# Patient Record
Sex: Male | Born: 1965 | Race: White | Hispanic: No | Marital: Single | Smoking: Current every day smoker
Health system: Southern US, Community
[De-identification: ages and names within clinical notes are randomized; demographics above are authoritative.]

## PROBLEM LIST (undated history)

## (undated) DIAGNOSIS — G8929 Other chronic pain: Secondary | ICD-10-CM

## (undated) DIAGNOSIS — S82392A Other fracture of lower end of left tibia, initial encounter for closed fracture: Secondary | ICD-10-CM

## (undated) DIAGNOSIS — IMO0002 Reserved for concepts with insufficient information to code with codable children: Secondary | ICD-10-CM

## (undated) DIAGNOSIS — K859 Acute pancreatitis without necrosis or infection, unspecified: Secondary | ICD-10-CM

## (undated) DIAGNOSIS — F419 Anxiety disorder, unspecified: Secondary | ICD-10-CM

## (undated) DIAGNOSIS — I219 Acute myocardial infarction, unspecified: Secondary | ICD-10-CM

## (undated) DIAGNOSIS — M549 Dorsalgia, unspecified: Secondary | ICD-10-CM

## (undated) DIAGNOSIS — E119 Type 2 diabetes mellitus without complications: Secondary | ICD-10-CM

## (undated) DIAGNOSIS — R7303 Prediabetes: Secondary | ICD-10-CM

## (undated) DIAGNOSIS — G4730 Sleep apnea, unspecified: Secondary | ICD-10-CM

## (undated) DIAGNOSIS — I10 Essential (primary) hypertension: Secondary | ICD-10-CM

## (undated) DIAGNOSIS — E7800 Pure hypercholesterolemia, unspecified: Secondary | ICD-10-CM

## (undated) HISTORY — PX: APPENDECTOMY: SHX54

## (undated) HISTORY — DX: Essential (primary) hypertension: I10

## (undated) HISTORY — PX: BACK SURGERY: SHX140

## (undated) HISTORY — PX: ABDOMINAL SURGERY: SHX537

---

## 1988-07-11 HISTORY — PX: ABDOMINAL SURGERY: SHX537

## 2001-07-11 HISTORY — PX: CARDIAC CATHETERIZATION: SHX172

## 2002-03-15 ENCOUNTER — Inpatient Hospital Stay (HOSPITAL_COMMUNITY): Admission: EM | Admit: 2002-03-15 | Discharge: 2002-03-18 | Payer: Self-pay | Admitting: Cardiology

## 2002-03-18 ENCOUNTER — Encounter: Payer: Self-pay | Admitting: Cardiology

## 2002-10-02 ENCOUNTER — Ambulatory Visit (HOSPITAL_BASED_OUTPATIENT_CLINIC_OR_DEPARTMENT_OTHER): Admission: RE | Admit: 2002-10-02 | Discharge: 2002-10-02 | Payer: Self-pay | Admitting: *Deleted

## 2003-01-15 ENCOUNTER — Ambulatory Visit (HOSPITAL_BASED_OUTPATIENT_CLINIC_OR_DEPARTMENT_OTHER): Admission: RE | Admit: 2003-01-15 | Discharge: 2003-01-15 | Payer: Self-pay | Admitting: *Deleted

## 2004-10-09 ENCOUNTER — Emergency Department (HOSPITAL_COMMUNITY): Admission: EM | Admit: 2004-10-09 | Discharge: 2004-10-09 | Payer: Self-pay | Admitting: Emergency Medicine

## 2009-04-03 ENCOUNTER — Emergency Department (HOSPITAL_COMMUNITY): Admission: EM | Admit: 2009-04-03 | Discharge: 2009-04-03 | Payer: Self-pay | Admitting: Emergency Medicine

## 2009-06-18 ENCOUNTER — Ambulatory Visit (HOSPITAL_COMMUNITY): Admission: RE | Admit: 2009-06-18 | Discharge: 2009-06-18 | Payer: Self-pay | Admitting: Family Medicine

## 2010-11-26 NOTE — Op Note (Signed)
[name], BRAU                      ACCOUNT NO.:  [id number]   MEDICAL RECORD NO.:  [id number]                   PATIENT TYPE:  AMB   LOCATION:  DSC                                  FACILITY:  [facility]   PHYSICIAN:  Lowell Bouton, M.D.      DATE OF BIRTH:  1966-05-09   DATE OF PROCEDURE:  DATE OF DISCHARGE:                                 OPERATIVE REPORT   PREOPERATIVE DIAGNOSIS:  Chronic laceration flexor pollicis longus, left  thumb.   POSTOPERATIVE DIAGNOSIS:  Chronic laceration flexor pollicis longus, left  thumb.   OPERATION/PROCEDURE:  Excision of flexor tendon, left thumb, with insertion  of Hunter silastic tendon rod, left thumb.   SURGEON:  Lowell Bouton, M.D.   ANESTHESIA:  General.   OPERATIVE FINDINGS:  The patient had a 69-month-old FPL tendon laceration at  the level of the proximal phalanx.  The distal end of the tendon appeared  normal at the IP joint.  There was significant scarring in the tendon sheath  with some pseudo tendon formation beneath the oblique pulley and the A1  pulley.  The proximal end of the tendon had retracted back proximal to the  MP joint and was easily retrieved at the wrist level.  Due to the severe  amount of scarring in the tendon sheath, it was felt that an immediate  tendon graft would not be possible and that a two-stage tendon procedure  would be performed.   DESCRIPTION OF PROCEDURE:  Under general anesthesia with the tourniquet on  the left arm and left leg, the left upper and lower extremities were prepped  and draped in the usual fashion.  The upper limb was exsanguinated and the  tourniquet was inflated to 250 mmHg.  A volar Brunner approach was made to  the thumb with a zigzag incision.  This extended from just proximal to the  MP joint out to the mid pulp. Sharp dissection was carried to the  subcutaneous tissue and there was a significant amount of scarring in the  subcutaneous tissues.   Blunt dissection was carried down to flexor sheath,  taking care to protect the digital nerve.  A 4-0 silk retention suture was  inserted for retraction.  The flexor tendon was identified distally and was  pulled out from the pulley system leaving a flap of tendon attached  distally.  The flexor tendon sheath was significantly scarred and there was  pseudo tendon that was excised.  The Therapist, nutritional and a Scientist, physiological were used to release any of the pseudo tendon.  At this point a V-  shaped incision was made following an old scar at the wrist radially and  carried down through the subcutaneous tissue.  Blunt dissection was carried  down to the FCR sheath and the FCR tendon was retracted radially.  The floor  of the sheath was incised and the flexor pollicis longus muscle belly was  identified.  The tendon was pulled  back into the wrist level and was  transected keeping the remaining tendon tissue for possible pulley  reconstruction if needed.  The thumb tendon sheath was then dilated with a  Freer elevator beneath the oblique and the A1 pulleys which remained intact.  A #4 passive Hunter tendon rod was then inserted from distal to proximal and  an attempt was made to pass the rod back to the wrist without success.  A  Carroll tendon passer was placed from proximal to distal but could not be  threaded up to the thumb.  At this point a carpal tunnel incision was made  and carried down sharply through the subcutaneous tissues.  The carpal  tunnel was released sharply on the ulnar side of the median nerve allowing  access to the [facility].  This was then placed up to the MP  joint of the thumb and the Hunter rod was passed from the thumb back to the  wrist level.  The Hunter rod was attached distally with a 3-0 Ethibond  suture in a Kessler fashion underneath the stump of the remaining FDL.  This  was secured multiple times with multiple sutures.  The thumb was then   passively flexed and the Hunter rod did fly beneath the flexor pulley  system.  The Hunter rod was then transected at the FPL muscle belly and  there was good excursion with flexion of the thumb and wrist.  The wounds were then irrigated copiously with saline and the carpal tunnel  incision and the thumb incision were closed with 4-0 nylon sutures.  The  wrist incision was closed with a 3-0 subcuticular Prolene.  Steri-Strips  were applied.  Sterile dressings were applied and a dorsal thumb spica  splint with the wrist in 30 degrees of flexion and the thumb flexed.  The  tourniquet was released with good circulation of the hand, 0.5% Marcaine was  inserted for pain control.  The patient tolerated the procedure well and  went to the recovery room awake and stable in good condition.                                               Lowell Bouton, M.D.    EMM/MEDQ  D:  10/02/2002  T:  10/02/2002  Job:  [id number]

## 2010-11-26 NOTE — Op Note (Signed)
[name], [name]                      ACCOUNT NO.:  [id number]   MEDICAL RECORD NO.:  [id number]                   PATIENT TYPE:  AMB   LOCATION:  DSC                                  FACILITY:  [facility]   PHYSICIAN:  Lowell Bouton, M.D.      DATE OF BIRTH:  1966-05-23   DATE OF PROCEDURE:  01/15/2003  DATE OF DISCHARGE:                                 OPERATIVE REPORT   PREOPERATIVE DIAGNOSIS:  Status post stage I tendon reconstruction, left  thumb.   POSTOPERATIVE DIAGNOSIS:  Status post stage I tendon reconstruction, left  thumb.   OPERATION PERFORMED:  Stage II tendon reconstruction, left thumb, using  palmaris longus tendon graft, left forearm.   SURGEON:  Lowell Bouton, M.D.   ANESTHESIA:  General.   OPERATIVE FINDINGS:  The patient had a fair amount of scarring at the wrist  level but the motor of the FPL tendon was present with a large muscle belly.  There was good synovial sheath surrounding the Hunter rod.   DESCRIPTION OF PROCEDURE:  Under general anesthesia with a tourniquet on the  left arm, the left arm was prepped and draped in the usual sterile fashion.  After exsanguinating the limb, the tourniquet was inflated to .  The  previous incision was opened, over the IP joint volarly on the left thumb.  There was a lot of scarring in the subcutaneous tissues.  Sharp dissection  was carried down to the tendon sheath and the Hunter rod was identified  distally.  Curved incision was then made over the volar radial aspect of the  wrist in line with the old incision.  Sharp dissection was carried through  the subcutaneous tissues and bleeding points were coagulated.  Sharp  dissection was carried through the FCR sheath and the floor of the sheath.  Blunt dissection was carried down to the FPL muscle belly and this was freed  of scar tissue.  Weitlaner retractors were inserted and the FPL tendon was  prepared at the muscle belly for  grafting.  By flexing the thumb, the Hunter  rod poked through the sheath into the wrist.  At this point a palmaris  longus tendon graft was obtained by making a transverse incision at the  distal wrist crease over the tendon.  The tendon was dissected out and then  a 4-0 Ethibond suture placed on the end of it.  It was transected at the  distal wrist crease.  A second transverse incision was made more proximally  and the tendon was brought into the proximal wound.  A third and a fourth  incision were made measuring 1 cm long over the tendon up to the proximal  forearm.  The entire tendon graft was then removed and placed in a saline  soaked sponge. The wounds on the forearm were closed with a 4-0 nylon  suture.  The tendon graft from the palmaris longus was then attached to  proximal end of  the Hunter rod at the wrist using a 3-0 Ethibond Kessler  suture.  The Hunter rod was then pulled out distally and allowed passage of  the palmaris graft from proximal to distal in the tendon sheath of the  thumb.  The Hunter rod was discarded.  The graft appeared to be of  appropriate length and so a 3-0 Prolene suture was placed in a Kessler  fashion in the distal end.  Sharp dissection was carried down to the distal  phalanx underneath the stump of the old FPL.  A Keith needle was placed  times two through the distal phalanx and the 3-0 Prolene suture was passed  through the two Malo needles and tied over a button on the dorsum of the  thumb.  The distal wound was then irrigated and closed with 4-0 nylon  sutures.  The tendon was then pulled on proximally and did flex the tip of  the thumb.  The proximal juncture was made in a Pulvertaft weave fashion  into the old FPL muscle belly and the remaining tendon.  Three passes were  made with the tendon braider and was secured with 3-0 Ethibond suture.  The  tension was set with the thumb flexed maximally in neutral wrist position.  After doing the weave,  proximally by tenodesis, the thumb did extend with  full flexion and flexed with full extension of the wrist.  The wound was  then irrigated copiously with saline. A vessel loop drain was left in for  drainage.  The skin was closed with 4-0 nylon suture.  Sterile dressings  were applied followed by a dorsal splint with the wrist in 30 degrees of  flexion and the thumb flexed fully.  The tourniquet was released with good  circulation to the hand.  The patient went to the recovery room awake and  stable in good condition.                                                Lowell Bouton, M.D.    EMM/MEDQ  D:  01/15/2003  T:  01/15/2003  Job:  [id number]

## 2010-11-26 NOTE — Cardiovascular Report (Signed)
[name], DUFRESNE                      ACCOUNT NO.:  [id number]   MEDICAL RECORD NO.:  [id number]                   PATIENT TYPE:  INP   LOCATION:  2040                                 FACILITY:  [facility]   PHYSICIAN:  Veneda Melter, M.D. [facility]               DATE OF BIRTH:  03-05-1966   DATE OF PROCEDURE:  03/18/2002  DATE OF DISCHARGE:                              CARDIAC CATHETERIZATION   PROCEDURE PERFORMED:  1. Left heart catheterization.  2. Left ventriculogram.  3. Selective coronary angiography.   DIAGNOSES:  1. Trivial coronary artery disease by angiogram.  2. Normal left ventricular systolic function.   HISTORY:  The patient is a 45 year old gentleman who presents with  substernal chest discomfort.  The patient was admitted to the hospital and  had nonspecific ECG and cardiac enzyme changes.  In addition, he had  abnormalities on sinus rhythm suggestive either of ventricular tachycardia  or artifact.  Given his strong family history of coronary disease, continued  tobacco use, and high risk features, he was referred for coronary  angiography.   TECHNIQUE:  Informed consent was obtained.  The patient was brought to the  catheterization lab.  A #6 French sheath was placed in the right femoral  artery using the modified Seldinger technique.  A #6 [place] and JR4  catheter was then used to engage the left and right coronary arteries and  selective angiography performed in various projections using manual  injections of contrast.  A #6 French pigtail catheter was then advanced to  the left ventricle and left ventriculogram performed using power injection  of contrast.  At the termination of the case, the catheters and sheath were  removed and manual pressure applied until adequate hemostasis was achieved.  The patient tolerated the procedure well and was transferred to the floor in  stable condition.  Findings are as follows:   FINDINGS:  1. Left main trunk:  Large  caliber vessel, angiographically normal.  2. LAD:  This is a medium caliber vessel that provides two diagonal     branches.  The LAD has luminal irregularities in the distal section of     20%.  3. Left circumflex artery:  This is a medium caliber vessel that provides     two small marginal branches in the mid section.  The left circumflex has     mild disease of 20-30% in the AV segment extending into the second     marginal branch.  4. The right coronary artery is dominant.  This is a large caliber vessel     that provides a posterior descending artery and posterior ventricular     branch.  The right coronary artery has mild ostial narrowing of 20%.  The     remainder of the vessel has luminal irregularities.  5. LV:  Normal end systolic and end diastolic dimensions.  Overall left     ventricular function  is well preserved.  Ejection fraction of greater     than 55%.  No mitral regurgitation.   PRESSURES:  1. LV pressure is 120/7.  2. Aortic is 120/80.  3. LVEDP equals 15.   ASSESSMENT/PLAN:  The patient is a 45 year old gentleman who presents with  substernal chest discomfort.  He has mild coronary artery disease by  angiogram.  Continued medical therapy and risk factor modification will be  pursued and the cause of chest pain investigated.                                               Veneda Melter, M.D. [facility]    NG/MEDQ  D:  03/18/2002  T:  03/18/2002  Job:  [id number]   cc:   Luis Abed, M.D. [facility]   Xaje Hasanaj

## 2010-11-26 NOTE — Discharge Summary (Signed)
   [name], [name]                      ACCOUNT NO.:  [id number]   MEDICAL RECORD NO.:  [id number]                   PATIENT TYPE:  INP   LOCATION:  2040                                 FACILITY:  [facility]   PHYSICIAN:  Gerrit Friends. Dietrich Pates, M.D. [facility]        DATE OF BIRTH:  Jun 29, 1966   DATE OF ADMISSION:  03/15/2002  DATE OF DISCHARGE:                           DISCHARGE SUMMARY - REFERRING   HISTORY OF PRESENT ILLNESS:  The patient is a 45 year old patient that was  initially admitted to [facility] after a sudden onset of substernal  chest pain.  He was admitted to [facility] on March 14, 2002 and on that  date, he underwent cardiac consultation by Dr. Luis Abed.  Apparently,  at that time, there was a rhythm strip that was concerning for ventricular  tachycardia and because of his symptoms and because of his rhythm, he was  sent to [facility] for cardiac catheterization.   HOSPITAL COURSE:  On March 18, 2002, he underwent cardiac catheterization  and was found to have luminal irregularities of his blood vessels, however,  no obstructive coronary artery disease noted.  His EF was 55% with no MR.  Plans were made for him to go home on March 18, 2002 with no medication  changes.   DISPOSITION AND FOLLOWUP:  Our plans are for him to go home and to follow up  in the office at [facility] under the care of Dr. Gerrit Friends. Rothbart.  Apparently, Dr. Dietrich Pates takes care of this patient's father, who does have  known coronary artery disease.  The appointment is on Tuesday, September  23rd, at 1 p.m.; this will be for an exercise treadmill test.  Although the  patient's blood pressure has been under good condition here, we need to  monitor the patient's blood pressure during the stress test and look for any  ventricular arrhythmias.   LABORATORY STUDIES:  Troponin 0.05.  Potassium 4.0, BUN 11, creatinine 1.0.  Hemoglobin 15.9, hematocrit 45.5, platelets 130,000,  white count 14.6.   DISCHARGE INSTRUCTIONS:  The patient will need a CBC redrawn at the office  to assure his leukocytosis has resolved.     Guy Franco, P.A. [facility]                      Gerrit Friends. Dietrich Pates, M.D. [facility]    LB/MEDQ  D:  03/18/2002  T:  03/19/2002  Job:  [id number]   cc:   Lia Hopping

## 2010-12-17 ENCOUNTER — Ambulatory Visit (HOSPITAL_COMMUNITY)
Admission: RE | Admit: 2010-12-17 | Discharge: 2010-12-17 | Disposition: A | Discharge status: Home / Self Care | Payer: PRIVATE HEALTH INSURANCE | Source: Ambulatory Visit | Attending: Family Medicine | Admitting: Family Medicine

## 2010-12-17 ENCOUNTER — Other Ambulatory Visit (HOSPITAL_COMMUNITY): Payer: Self-pay | Admitting: Family Medicine

## 2010-12-17 DIAGNOSIS — E785 Hyperlipidemia, unspecified: Secondary | ICD-10-CM

## 2010-12-17 DIAGNOSIS — F17200 Nicotine dependence, unspecified, uncomplicated: Secondary | ICD-10-CM | POA: Insufficient documentation

## 2010-12-17 DIAGNOSIS — E119 Type 2 diabetes mellitus without complications: Secondary | ICD-10-CM

## 2010-12-17 DIAGNOSIS — R079 Chest pain, unspecified: Secondary | ICD-10-CM | POA: Insufficient documentation

## 2011-05-29 ENCOUNTER — Emergency Department (HOSPITAL_COMMUNITY)
Admission: EM | Admit: 2011-05-29 | Discharge: 2011-05-30 | Disposition: A | Discharge status: Home / Self Care | Payer: PRIVATE HEALTH INSURANCE | Attending: Emergency Medicine | Admitting: Emergency Medicine

## 2011-05-29 DIAGNOSIS — F411 Generalized anxiety disorder: Secondary | ICD-10-CM | POA: Insufficient documentation

## 2011-05-29 DIAGNOSIS — Z202 Contact with and (suspected) exposure to infections with a predominantly sexual mode of transmission: Secondary | ICD-10-CM | POA: Insufficient documentation

## 2011-05-29 DIAGNOSIS — F17200 Nicotine dependence, unspecified, uncomplicated: Secondary | ICD-10-CM | POA: Insufficient documentation

## 2011-05-29 HISTORY — DX: Pure hypercholesterolemia, unspecified: E78.00

## 2011-05-29 HISTORY — DX: Anxiety disorder, unspecified: F41.9

## 2011-05-29 MED ORDER — AZITHROMYCIN 250 MG PO TABS
1000.0000 mg | ORAL_TABLET | Freq: Once | ORAL | Status: AC
  Administered 2011-05-29: 1000 mg via ORAL
  Filled 2011-05-29: qty 4

## 2011-05-29 MED ORDER — METRONIDAZOLE 500 MG PO TABS
2000.0000 mg | ORAL_TABLET | Freq: Once | ORAL | Status: AC
  Administered 2011-05-29: 2000 mg via ORAL
  Filled 2011-05-29: qty 4

## 2011-05-29 MED ORDER — LIDOCAINE HCL (PF) 1 % IJ SOLN
INTRAMUSCULAR | Status: AC
  Administered 2011-05-29
  Filled 2011-05-29: qty 5

## 2011-05-29 MED ORDER — CEFTRIAXONE SODIUM 250 MG IJ SOLR
250.0000 mg | Freq: Once | INTRAMUSCULAR | Status: AC
  Administered 2011-05-29: 250 mg via INTRAMUSCULAR
  Filled 2011-05-29: qty 250

## 2011-05-29 NOTE — ED Notes (Signed)
Pts girlfriend was diagnosed with trichomonas today and pt wants to be checked for the same.

## 2011-05-29 NOTE — ED Provider Notes (Signed)
History     CSN: [id number] Arrival date & time: 05/29/2011 10:11 PM   First MD Initiated Contact with Patient 05/29/11 2215      Chief Complaint  Patient presents with  . Exposure to STD    (Consider location/radiation/quality/duration/timing/severity/associated sxs/prior treatment) HPI Comments: Pt's wife was seen earlier today and dx with trich and bacterial vaginosis.  He states she is accusing him of giving the STD's and he is here to be evaluated.  He denies penile d/c, skin rash or joint pain.  Patient is a 45 y.o. male presenting with STD exposure. The history is provided by the patient. No language interpreter was used.  Exposure to STD This is a new problem.    Past Medical History  Diagnosis Date  . Hypercholesteremia   . Anxiety     Past Surgical History  Procedure Date  . Appendectomy     No family history on file.  History  Substance Use Topics  . Smoking status: Current Everyday Smoker -- 0.5 packs/day  . Smokeless tobacco: Not on file  . Alcohol Use: No      Review of Systems  Genitourinary: Negative for dysuria, urgency, frequency, hematuria, flank pain, discharge, scrotal swelling, genital sores, penile pain and testicular pain.  All other systems reviewed and are negative.    Allergies  Review of patient's allergies indicates no known allergies.  Home Medications   Current Outpatient Rx  Name Route Sig Dispense Refill  . ALPRAZOLAM 1 MG PO TABS Oral Take 1 mg by mouth 3 (three) times daily.        BP 150/98  Pulse 79  Temp(Src) 97.5 F (36.4 C) (Oral)  Resp 20  Ht 6' (1.829 m)  Wt 239 lb (108.41 kg)  BMI 32.41 kg/m2  SpO2 99%  Physical Exam  Nursing note and vitals reviewed. Constitutional: He is oriented to person, place, and time. He appears well-developed and well-nourished.  HENT:  Head: Normocephalic and atraumatic.  Eyes: EOM are normal.  Neck: Normal range of motion.  Cardiovascular: Normal rate, regular rhythm,  normal heart sounds and intact distal pulses.   Pulmonary/Chest: Effort normal and breath sounds normal. No respiratory distress.  Abdominal: Soft. He exhibits no distension. There is no tenderness.  Genitourinary: Testes normal and penis normal. Circumcised. No phimosis, penile erythema or penile tenderness. No discharge found.  Musculoskeletal: Normal range of motion.  Neurological: He is alert and oriented to person, place, and time.  Skin: Skin is warm and dry.  Psychiatric: He has a normal mood and affect. Judgment normal.    ED Course  Procedures (including critical care time)   Labs Reviewed  URINALYSIS, ROUTINE W REFLEX MICROSCOPIC   No results found.   No diagnosis found.    MDM          Worthy Rancher, PA 05/30/11 [phone]

## 2011-05-30 LAB — URINALYSIS, ROUTINE W REFLEX MICROSCOPIC
Bilirubin Urine: NEGATIVE
Hgb urine dipstick: NEGATIVE
Protein, ur: NEGATIVE mg/dL
Specific Gravity, Urine: 1.015 (ref 1.005–1.030)
Urobilinogen, UA: 0.2 mg/dL (ref 0.0–1.0)

## 2011-05-30 NOTE — ED Notes (Signed)
Patient sitting waiting for discharge papers. Patient given a drink.

## 2011-05-30 NOTE — ED Provider Notes (Signed)
Medical screening examination/treatment/procedure(s) were performed by non-physician practitioner and as supervising physician I was immediately available for consultation/collaboration. Devoria Albe, MD, Armando Gang   Ward Givens, MD 05/30/11 [phone]

## 2011-08-17 ENCOUNTER — Encounter: Payer: Self-pay | Admitting: Orthopedic Surgery

## 2011-08-17 ENCOUNTER — Ambulatory Visit: Payer: PRIVATE HEALTH INSURANCE | Admitting: Orthopedic Surgery

## 2011-10-12 ENCOUNTER — Emergency Department (HOSPITAL_COMMUNITY): Payer: PRIVATE HEALTH INSURANCE

## 2011-10-12 ENCOUNTER — Other Ambulatory Visit: Payer: Self-pay

## 2011-10-12 ENCOUNTER — Encounter (HOSPITAL_COMMUNITY): Payer: Self-pay | Admitting: *Deleted

## 2011-10-12 ENCOUNTER — Emergency Department (HOSPITAL_COMMUNITY)
Admission: EM | Admit: 2011-10-12 | Discharge: 2011-10-12 | Disposition: A | Discharge status: Home / Self Care | Payer: PRIVATE HEALTH INSURANCE | Attending: Emergency Medicine | Admitting: Emergency Medicine

## 2011-10-12 DIAGNOSIS — R079 Chest pain, unspecified: Secondary | ICD-10-CM | POA: Insufficient documentation

## 2011-10-12 DIAGNOSIS — E7800 Pure hypercholesterolemia, unspecified: Secondary | ICD-10-CM | POA: Insufficient documentation

## 2011-10-12 DIAGNOSIS — F411 Generalized anxiety disorder: Secondary | ICD-10-CM | POA: Insufficient documentation

## 2011-10-12 DIAGNOSIS — K2970 Gastritis, unspecified, without bleeding: Secondary | ICD-10-CM

## 2011-10-12 DIAGNOSIS — K5900 Constipation, unspecified: Secondary | ICD-10-CM | POA: Insufficient documentation

## 2011-10-12 LAB — DIFFERENTIAL
Basophils Relative: 0 % (ref 0–1)
Lymphs Abs: 2.9 10*3/uL (ref 0.7–4.0)
Monocytes Absolute: 1.3 10*3/uL — ABNORMAL HIGH (ref 0.1–1.0)
Monocytes Relative: 9 % (ref 3–12)
Neutro Abs: 10 10*3/uL — ABNORMAL HIGH (ref 1.7–7.7)

## 2011-10-12 LAB — CBC
HCT: 42.9 % (ref 39.0–52.0)
Hemoglobin: 14.9 g/dL (ref 13.0–17.0)
MCHC: 34.7 g/dL (ref 30.0–36.0)

## 2011-10-12 LAB — COMPREHENSIVE METABOLIC PANEL
BUN: 9 mg/dL (ref 6–23)
CO2: 27 mEq/L (ref 19–32)
Chloride: 95 mEq/L — ABNORMAL LOW (ref 96–112)
Creatinine, Ser: 0.78 mg/dL (ref 0.50–1.35)
GFR calc Af Amer: >90 mL/min (ref >90)
GFR calc non Af Amer: >90 mL/min (ref >90)
Glucose, Bld: 98 mg/dL (ref 70–99)
Total Bilirubin: 0.3 mg/dL (ref 0.3–1.2)

## 2011-10-12 LAB — LIPASE, BLOOD: Lipase: 50 U/L (ref 11–59)

## 2011-10-12 LAB — POCT I-STAT TROPONIN I

## 2011-10-12 MED ORDER — FAMOTIDINE 20 MG PO TABS
20.0000 mg | ORAL_TABLET | Freq: Once | ORAL | Status: AC
  Administered 2011-10-12: 20 mg via ORAL
  Filled 2011-10-12: qty 1

## 2011-10-12 MED ORDER — LACTULOSE 10 GM/15ML PO SOLN
30.0000 g | Freq: Once | ORAL | Status: AC
  Administered 2011-10-12: 30 g via ORAL
  Filled 2011-10-12: qty 45

## 2011-10-12 MED ORDER — GI COCKTAIL ~~LOC~~
30.0000 mL | Freq: Once | ORAL | Status: AC
  Administered 2011-10-12: 30 mL via ORAL
  Filled 2011-10-12: qty 30

## 2011-10-12 MED ORDER — LACTULOSE 10 GM/15ML PO SOLN
ORAL | Status: AC
  Filled 2011-10-12: qty 60

## 2011-10-12 MED ORDER — FAMOTIDINE 20 MG PO TABS: 20.0000 mg | ORAL_TABLET | Freq: Two times a day (BID) | ORAL | Status: DC | PRN

## 2011-10-12 NOTE — Discharge Instructions (Signed)
Constipation in Adults Constipation is having fewer than 2 bowel movements per week. Usually, the stools are hard. As we grow older, constipation is more common. If you try to fix constipation with laxatives, the problem may get worse. This is because laxatives taken over a long period of time make the colon muscles weaker. A low-fiber diet, not taking in enough fluids, and taking some medicines may make these problems worse. MEDICATIONS THAT MAY CAUSE CONSTIPATION  Water pills (diuretics).   Calcium channel blockers (used to control blood pressure and for the heart).   Certain pain medicines (narcotics).   Anticholinergics.   Anti-inflammatory agents.   Antacids that contain aluminum.  DISEASES THAT CONTRIBUTE TO CONSTIPATION  Diabetes.   Parkinson's disease.   Dementia.   Stroke.   Depression.   Illnesses that cause problems with salt and water metabolism.  HOME CARE INSTRUCTIONS   Constipation is usually best cared for without medicines. Increasing dietary fiber and eating more fruits and vegetables is the best way to manage constipation.   Slowly increase fiber intake to 25 to 38 grams per day. Whole grains, fruits, vegetables, and legumes are good sources of fiber. A dietitian can further help you incorporate high-fiber foods into your diet.   Drink enough water and fluids to keep your urine clear or pale yellow.   A fiber supplement may be added to your diet if you cannot get enough fiber from foods.   Increasing your activities also helps improve regularity.   Suppositories, as suggested by your caregiver, will also help. If you are using antacids, such as aluminum or calcium containing products, it will be helpful to switch to products containing magnesium if your caregiver says it is okay.   If you have been given a liquid injection (enema) today, this is only a temporary measure. It should not be relied on for treatment of longstanding (chronic) constipation.    Stronger measures, such as magnesium sulfate, should be avoided if possible. This may cause uncontrollable diarrhea. Using magnesium sulfate may not allow you time to make it to the bathroom.  SEEK IMMEDIATE MEDICAL CARE IF:   There is bright red blood in the stool.   The constipation stays for more than 4 days.   There is belly (abdominal) or rectal pain.   You do not seem to be getting better.   You have any questions or concerns.  MAKE SURE YOU:   Understand these instructions.   Will watch your condition.   Will get help right away if you are not doing well or get worse.  Document Released: 03/25/2004 Document Revised: 06/16/2011 Document Reviewed: 05/31/2011 [facility] Patient Information 2012 [place], [place].Bloating Bloating is the feeling of fullness in your belly. You may feel as though your pants are too tight. Often the cause of bloating is overeating, retaining fluids, or having gas in your bowel. It is also caused by swallowing air and eating foods that cause gas. Irritable bowel syndrome is one of the most common causes of bloating. Constipation is also a common cause. Sometimes more serious problems can cause bloating. SYMPTOMS  Usually there is a feeling of fullness, as though your abdomen is bulged out. There may be mild discomfort.  DIAGNOSIS  Usually no particular testing is necessary for most bloating. If the condition persists and seems to become worse, your caregiver may do additional testing.  TREATMENT   There is no direct treatment for bloating.   Do not put gas into the bowel. Avoid chewing  gum and sucking on candy. These tend to make you swallow air. Swallowing air can also be a nervous habit. Try to avoid this.   Avoiding high residue diets will help. Eat foods with soluble fibers (examples include root vegetables, apples, or barley) and substitute dairy products with soy and rice products. This helps irritable bowel syndrome.   If constipation is the  cause, then a high residue diet with more fiber will help.   Avoid carbonated beverages.   Over-the-counter preparations are available that help reduce gas. Your pharmacist can help you with this.  SEEK MEDICAL CARE IF:   Bloating continues and seems to be getting worse.   You notice a weight gain.   You have a weight loss but the bloating is getting worse.   You have changes in your bowel habits or develop nausea or vomiting.  SEEK IMMEDIATE MEDICAL CARE IF:   You develop shortness of breath or swelling in your legs.   You have an increase in abdominal pain or develop chest pain.  Document Released: 04/27/2006 Document Revised: 06/16/2011 Document Reviewed: 06/15/2007 [facility] Patient Information 2012 [place], [place].Fiber Content in Foods Drinking plenty of fluids and consuming foods high in fiber can help with constipation. See the list below for the fiber content of some common foods. Starches and Grains / Dietary Fiber (g)  Cheerios, 1 cup / 3 g   Kellogg's Corn Flakes, 1 cup / 0.7 g   Rice Krispies, 1  cup / 0.3 g   Quaker Oat Life Cereal,  cup / 2.1 g   Oatmeal, instant (cooked),  cup / 2 g   Kellogg's Frosted Mini Wheats, 1 cup / 5.1 g   Rice, brown, long-grain (cooked), 1 cup / 3.5 g   Rice, white, long-grain (cooked), 1 cup / 0.6 g   Macaroni, cooked, enriched, 1 cup / 2.5 g  Legumes / Dietary Fiber (g)  Beans, baked, canned, plain or vegetarian,  cup / 5.2 g   Beans, kidney, canned,  cup / 6.8 g   Beans, pinto, dried (cooked),  cup / 7.7 g   Beans, pinto, canned,  cup / 5.5 g  Breads and Crackers / Dietary Fiber (g)  Graham crackers, plain or honey, 2 squares / 0.7 g   Saltine crackers, 3 squares / 0.3 g   Pretzels, plain, salted, 10 pieces / 1.8 g   Bread, whole-wheat, 1 slice / 1.9 g   Bread, white, 1 slice / 0.7 g   Bread, raisin, 1 slice / 1.2 g   Bagel, plain, 3 oz / 2 g   Tortilla, flour, 1 oz / 0.9 g   Tortilla, corn, 1  small / 1.5 g   Bun, hamburger or hotdog, 1 small / 0.9 g  Fruits / Dietary Fiber (g)  Apple, raw with skin, 1 medium / 4.4 g   Applesauce, sweetened,  cup / 1.5 g   Banana,  medium / 1.5 g   Grapes, 10 grapes / 0.4 g   Orange, 1 small / 2.3 g   Raisin, 1.5 oz / 1.6 g   Melon, 1 cup / 1.4 g  Vegetables / Dietary Fiber (g)  Green beans, canned,  cup / 1.3 g   Carrots (cooked),  cup / 2.3 g   Broccoli (cooked),  cup / 2.8 g   Peas, frozen (cooked),  cup / 4.4 g   Potatoes, mashed,  cup / 1.6 g   Lettuce, 1 cup / 0.5 g  Corn, canned,  cup / 1.6 g   Tomato,  cup / 1.1 g  Document Released: 11/13/2006 Document Revised: 06/16/2011 Document Reviewed: 01/08/2007 [facility] Patient Information 2012 [place], [place].High Fiber Diet A high fiber diet changes your normal diet to include more whole grains, legumes, fruits, and vegetables. Changes in the diet involve replacing refined carbohydrates with unrefined foods. The calorie level of the diet is essentially unchanged. The Dietary Reference Intake (recommended amount) for adult males is 38 g per day. For adult females, it is 25 g per day. Pregnant and lactating women should consume 28 g of fiber per day. Fiber is the intact part of a plant that is not broken down during digestion. Functional fiber is fiber that has been isolated from the plant to provide a beneficial effect in the body. PURPOSE  Increase stool bulk.   Ease and regulate bowel movements.   Lower cholesterol.  INDICATIONS THAT YOU NEED MORE FIBER  Constipation and hemorrhoids.   Uncomplicated diverticulosis (intestine condition) and irritable bowel syndrome.   Weight management.   As a protective measure against hardening of the arteries (atherosclerosis), diabetes, and cancer.  NOTE OF CAUTION If you have a digestive or bowel problem, ask your caregiver for advice before adding high fiber foods to your diet. Some of the following medical problems  are such that a high fiber diet should not be used without consulting your caregiver:  Acute diverticulitis (intestine infection).   Partial small bowel obstructions.   Complicated diverticular disease involving bleeding, rupture (perforation), or abscess (boil, furuncle).   Presence of autonomic neuropathy (nerve damage) or gastric paresis (stomach cannot empty itself).  GUIDELINES FOR INCREASING FIBER  Start adding fiber to the diet slowly. A gradual increase of about 5 more grams (2 slices of whole-wheat bread, 2 servings of most fruits or vegetables, or 1 bowl of high fiber cereal) per day is best. Too rapid an increase in fiber may result in constipation, flatulence, and bloating.   Drink enough water and fluids to keep your urine clear or pale yellow. Water, juice, or caffeine-free drinks are recommended. Not drinking enough fluid may cause constipation.   Eat a variety of high fiber foods rather than one type of fiber.   Try to increase your intake of fiber through using high fiber foods rather than fiber pills or supplements that contain small amounts of fiber.   The goal is to change the types of food eaten. Do not supplement your present diet with high fiber foods, but replace foods in your present diet.  INCLUDE A VARIETY OF FIBER SOURCES  Replace refined and processed grains with whole grains, canned fruits with fresh fruits, and incorporate other fiber sources. White rice, white breads, and most bakery goods contain little or no fiber.   Brown whole-grain rice, buckwheat oats, and many fruits and vegetables are all good sources of fiber. These include: broccoli, Brussels sprouts, cabbage, cauliflower, beets, sweet potatoes, white potatoes (skin on), carrots, tomatoes, eggplant, squash, berries, fresh fruits, and dried fruits.   Cereals appear to be the richest source of fiber. Cereal fiber is found in whole grains and bran. Bran is the fiber-rich outer coat of cereal grain,  which is largely removed in refining. In whole-grain cereals, the bran remains. In breakfast cereals, the largest amount of fiber is found in those with "bran" in their names. The fiber content is sometimes indicated on the label.   You may need to include additional fruits and vegetables each day.  In baking, for 1 cup white flour, you may use the following substitutions:   1 cup whole-wheat flour minus 2 tbs.    cup white flour plus  cup whole-wheat flour.  Document Released: 06/27/2005 Document Revised: 06/16/2011 Document Reviewed: 05/05/2009 [facility] Patient Information 2012 [place], [place].Gastritis Gastritis is an inflammation (the body's way of reacting to injury and/or infection) of the stomach. It is often caused by viral or bacterial (germ) infections. It can also be caused by chemicals (including alcohol) and medications. This illness may be associated with generalized malaise (feeling tired, not well), cramps, and fever. The illness may last 2 to 7 days. If symptoms of gastritis continue, gastroscopy (looking into the stomach with a telescope-like instrument), biopsy (taking tissue samples), and/or blood tests may be necessary to determine the cause. Antibiotics will not affect the illness unless there is a bacterial infection present. One common bacterial cause of gastritis is an organism known as H. Pylori. This can be treated with antibiotics. Other forms of gastritis are caused by too much acid in the stomach. They can be treated with medications such as H2 blockers and antacids. Home treatment is usually all that is needed. Young children will quickly become dehydrated (loss of body fluids) if vomiting and diarrhea are both present. Medications may be given to control nausea. Medications are usually not given for diarrhea unless especially bothersome. Some medications slow the removal of the virus from the gastrointestinal tract. This slows down the healing process. HOME CARE  INSTRUCTIONS Home care instructions for nausea and vomiting:  For adults: drink small amounts of fluids often. Drink at least 2 quarts a day. Take sips frequently. Do not drink large amounts of fluid at one time. This may worsen the nausea.   Only take over-the-counter or prescription medicines for pain, discomfort, or fever as directed by your caregiver.   Drink clear liquids only. Those are anything you can see through such as water, broth, or soft drinks.   Once you are keeping clear liquids down, you may start full liquids, soups, juices, and ice cream or sherbet. Slowly add bland (plain, not spicy) foods to your diet.  Home care instructions for diarrhea:  Diarrhea can be caused by bacterial infections or a virus. Your condition should improve with time, rest, fluids, and/or anti-diarrheal medication.   Until your diarrhea is under control, you should drink clear liquids often in small amounts. Clear liquids include: water, broth, jell-o water and weak tea.  Avoid:  Milk.   Fruits.   Tobacco.   Alcohol.   Extremely hot or cold fluids.   Too much intake of anything at one time.  When your diarrhea stops you may add the following foods, which help the stool to become more formed:  Rice.   Bananas.   Apples without skin.   Dry toast.  Once these foods are tolerated you may add low-fat yogurt and low-fat cottage cheese. They will help to restore the normal bacterial balance in your bowel. Wash your hands well to avoid spreading bacteria (germ) or virus. SEEK IMMEDIATE MEDICAL CARE IF:   You are unable to keep fluids down.   Vomiting or diarrhea become persistent (constant).   Abdominal pain develops, increases, or localizes. (Right sided pain can be appendicitis. Left sided pain in adults can be diverticulitis.)   You develop a fever (an oral temperature above 102 F (38.9 C)).   Diarrhea becomes excessive or contains blood or mucus.   You have excessive weakness,  dizziness,  fainting or extreme thirst.   You are not improving or you are getting worse.   You have any other questions or concerns.  Document Released: 06/21/2001 Document Revised: 06/16/2011 Document Reviewed: 06/27/2005 [facility] Patient Information 2012 [place], [place].

## 2011-10-12 NOTE — ED Notes (Signed)
Abd and chest pain since Monday 4/1,  No nvd.

## 2011-10-12 NOTE — ED Provider Notes (Signed)
History    This chart was scribed for Felisa Bonier, MD, MD by Smitty Pluck. The patient was seen in room APA18 and the patient's care was started at 6:48PM.   CSN: [id number]  Arrival date & time 10/12/11  1756   First MD Initiated Contact with Patient 10/12/11 1815      Chief Complaint  Patient presents with  . Chest Pain    (Consider location/radiation/quality/duration/timing/severity/associated sxs/prior treatment) Patient is a 46 y.o. male presenting with chest pain. The history is provided by the patient.  Chest Pain    [name] is a 46 y.o. male who presents to the Emergency Department complaining of moderate lower left chest pain radiating to abdomen onset 2 days ago. The pain has been constant since onset. Pt rates the pain at 3/10. Pt denies nausea, diaphoresis, aggravation of pain by exertion and breathing deeply aggravating pain. He reports last bowel movement was 1 day ago and he reports being constipated. Denies blood in stool and vomiting. He reports decreased appetite.  Has hx of intestinal adhesion surgery and anxiety.    Past Medical History  Diagnosis Date  . Hypercholesteremia   . Anxiety     Past Surgical History  Procedure Date  . Appendectomy   . Abdominal surgery     History reviewed. No pertinent family history.  History  Substance Use Topics  . Smoking status: Current Everyday Smoker -- 0.5 packs/day  . Smokeless tobacco: Not on file  . Alcohol Use: Yes      Review of Systems  Cardiovascular: Positive for chest pain.  All other systems reviewed and are negative.   10 Systems reviewed and all are negative for acute change except as noted in the HPI.   Allergies  Review of patient's allergies indicates not on file.  Home Medications   Current Outpatient Rx  Name Route Sig Dispense Refill  . ALPRAZOLAM 1 MG PO TABS Oral Take 1 mg by mouth 3 (three) times daily.        BP 145/90  Pulse 80  Temp(Src) 98 F (36.7 C) (Oral)   Resp 16  Ht 6\' 1"  (1.854 m)  Wt 235 lb (106.595 kg)  BMI 31.00 kg/m2  SpO2 98%  Physical Exam  Nursing note and vitals reviewed. Constitutional: He is oriented to person, place, and time. He appears well-developed and well-nourished. No distress.  HENT:  Head: Normocephalic and atraumatic.  Mouth/Throat: Oropharynx is clear and moist.  Eyes: EOM are normal. Pupils are equal, round, and reactive to light.  Neck: Normal range of motion. Neck supple. No JVD present. No tracheal deviation present.  Cardiovascular: Normal rate, regular rhythm, S1 normal, S2 normal, normal heart sounds and intact distal pulses.   No extrasystoles are present. PMI is not displaced.  Exam reveals no gallop and no friction rub.   No murmur heard. Pulmonary/Chest: Effort normal and breath sounds normal. No accessory muscle usage or stridor. Not tachypneic. No respiratory distress. He has no decreased breath sounds. He has no wheezes. He has no rhonchi. He has no rales. He exhibits no tenderness, no bony tenderness, no crepitus and no retraction.       Good air exchange  Abdominal: Soft. Bowel sounds are normal. He exhibits no distension and no mass. There is no tenderness. There is no rebound and no guarding.  Musculoskeletal: Normal range of motion. He exhibits no edema and no tenderness.  Neurological: He is alert and oriented to person, place, and time. No  cranial nerve deficit. He exhibits normal muscle tone.  Skin: Skin is warm and dry. No rash noted. He is not diaphoretic. No erythema. No pallor.  Psychiatric: He has a normal mood and affect. His behavior is normal. Judgment and thought content normal.    ED Course  Procedures (including critical care time) DIAGNOSTIC STUDIES: Oxygen Saturation is 98% on room air, normal by my interpretation.    COORDINATION OF CARE: 6:56PM EDP discusses pt ED treatment with pt.    Date: 10/12/2011  Rate: 77  Rhythm: normal sinus rhythm  QRS Axis: normal  Intervals:  normal  ST/T Wave abnormalities: normal  Conduction Disutrbances: none  Narrative Interpretation: unremarkable      Labs Reviewed  POCT I-STAT TROPONIN I   No results found.   No diagnosis found.    MDM  Musculoskeletal chest pain, costochondritis, GERD, Gastrointestinal Chest Pain, Pleuritic Chest Pain, Pneumonia, Pneumothorax, Esophageal Spasm, Arrhythmia considered among other potential etiologies in the patient's differential diagnosis.  I do not consider MI, ACS, Unstable angina, PE, or acute bowel obstruction likely based on history of atypical symptoms, and physical examination.  I personally performed the services described in this documentation, which was scribed in my presence. The recorded information has been reviewed and considered.         Felisa Bonier, MD 10/12/11 [phone]

## 2013-03-30 ENCOUNTER — Emergency Department (HOSPITAL_COMMUNITY)
Admission: EM | Admit: 2013-03-30 | Discharge: 2013-03-30 | Disposition: A | Discharge status: Home / Self Care | Payer: PRIVATE HEALTH INSURANCE | Attending: Emergency Medicine | Admitting: Emergency Medicine

## 2013-03-30 ENCOUNTER — Encounter (HOSPITAL_COMMUNITY): Payer: Self-pay

## 2013-03-30 DIAGNOSIS — Z862 Personal history of diseases of the blood and blood-forming organs and certain disorders involving the immune mechanism: Secondary | ICD-10-CM | POA: Insufficient documentation

## 2013-03-30 DIAGNOSIS — Z8639 Personal history of other endocrine, nutritional and metabolic disease: Secondary | ICD-10-CM | POA: Insufficient documentation

## 2013-03-30 DIAGNOSIS — M5432 Sciatica, left side: Secondary | ICD-10-CM

## 2013-03-30 DIAGNOSIS — F17200 Nicotine dependence, unspecified, uncomplicated: Secondary | ICD-10-CM | POA: Insufficient documentation

## 2013-03-30 DIAGNOSIS — F411 Generalized anxiety disorder: Secondary | ICD-10-CM | POA: Insufficient documentation

## 2013-03-30 DIAGNOSIS — Z79899 Other long term (current) drug therapy: Secondary | ICD-10-CM | POA: Insufficient documentation

## 2013-03-30 DIAGNOSIS — M5430 Sciatica, unspecified side: Secondary | ICD-10-CM | POA: Insufficient documentation

## 2013-03-30 MED ORDER — PREDNISONE 20 MG PO TABS: 40.0000 mg | ORAL_TABLET | Freq: Every day | ORAL | Status: DC

## 2013-03-30 MED ORDER — PREDNISONE 20 MG PO TABS: 40.0000 mg | ORAL_TABLET | Freq: Once | ORAL | Status: DC

## 2013-03-30 MED ORDER — HYDROMORPHONE HCL PF 1 MG/ML IJ SOLN
1.0000 mg | Freq: Once | INTRAMUSCULAR | Status: AC
  Administered 2013-03-30: 1 mg via INTRAMUSCULAR
  Filled 2013-03-30: qty 1

## 2013-03-30 MED ORDER — PREDNISONE 20 MG PO TABS
40.0000 mg | ORAL_TABLET | Freq: Once | ORAL | Status: AC
  Administered 2013-03-30: 40 mg via ORAL
  Filled 2013-03-30: qty 2

## 2013-03-30 NOTE — ED Provider Notes (Signed)
CSN: [id number]     Arrival date & time 03/30/13  1821 History   First MD Initiated Contact with Patient 03/30/13 1843     Chief Complaint  Patient presents with  . Back Pain   (Consider location/radiation/quality/duration/timing/severity/associated sxs/prior Treatment) HPI Comments: Patient here with lower left buttocks pain and back pain that radiates down the back of his left leg - he states this has been going on for the past month - he has been seen by his PCP, Dr. Phillips Odor and put on steroids and pain medication.  States no weakness, numbness, loss of control of bowels or bladder or urinary retention.  He has been told that he needs surgery but reports no MRI.  He states that his current pain medication is not helping his pain.  Patient is a 47 y.o. male presenting with back pain. The history is provided by the patient and the spouse. No language interpreter was used.  Back Pain Location:  Lumbar spine Quality:  Aching and burning Radiates to:  L thigh Pain severity:  Severe Pain is:  Same all the time Onset quality:  Gradual Duration:  1 month Timing:  Constant Progression:  Worsening Chronicity:  New Context: not jumping from heights, not MCA, not MVA, not physical stress, not recent injury and not twisting   Relieved by:  Nothing Worsened by:  Movement and sitting Ineffective treatments:  Being still and narcotics Associated symptoms: leg pain   Associated symptoms: no abdominal pain, no bladder incontinence, no bowel incontinence, no dysuria, no fever, no numbness, no paresthesias, no pelvic pain, no perianal numbness, no tingling and no weakness     Past Medical History  Diagnosis Date  . Hypercholesteremia   . Anxiety    Past Surgical History  Procedure Laterality Date  . Appendectomy    . Abdominal surgery     No family history on file. History  Substance Use Topics  . Smoking status: Current Every Day Smoker -- 0.50 packs/day  . Smokeless tobacco: Not on file    . Alcohol Use: Yes     Comment: occ    Review of Systems  Constitutional: Negative for fever.  Gastrointestinal: Negative for abdominal pain and bowel incontinence.  Genitourinary: Negative for bladder incontinence, dysuria and pelvic pain.  Musculoskeletal: Positive for back pain.  Neurological: Negative for tingling, weakness, numbness and paresthesias.  All other systems reviewed and are negative.    Allergies  Review of patient's allergies indicates no known allergies.  Home Medications   Current Outpatient Rx  Name  Route  Sig  Dispense  Refill  . ALPRAZolam (XANAX) 1 MG tablet   Oral   Take 1 mg by mouth 3 (three) times daily.           Marland Kitchen aspirin 325 MG tablet   Oral   Take 1,300 mg by mouth once as needed.         Marland Kitchen EXPIRED: famotidine (PEPCID) 20 MG tablet   Oral   Take 1 tablet (20 mg total) by mouth 2 (two) times daily as needed for heartburn (upset stomach).   14 tablet   0   . oxyCODONE-acetaminophen (TYLOX) 5-500 MG per capsule   Oral   Take 1 capsule by mouth every 4 (four) hours as needed. For pain          BP 146/84  Pulse 60  Temp(Src) 97.9 F (36.6 C) (Oral)  Resp 18  Ht 6\' 4"  (1.93 m)  Wt 240 lb (409.811  kg)  BMI 29.23 kg/m2  SpO2 98% Physical Exam  Nursing note and vitals reviewed. Constitutional: He is oriented to person, place, and time. He appears well-developed and well-nourished. No distress.  HENT:  Head: Normocephalic and atraumatic.  Mouth/Throat: Oropharynx is clear and moist.  Eyes: Conjunctivae are normal. Pupils are equal, round, and reactive to light. No scleral icterus.  Neck: Normal range of motion. Neck supple. No spinous process tenderness and no muscular tenderness present.  Musculoskeletal: Normal range of motion. He exhibits no edema.       Lumbar back: He exhibits tenderness. He exhibits normal range of motion and no bony tenderness.       Back:  Neurological: He is alert and oriented to person, place, and  time. He has normal reflexes. No cranial nerve deficit. He exhibits normal muscle tone. Coordination normal.  Skin: Skin is warm and dry. No rash noted. No erythema. No pallor.  Psychiatric: He has a normal mood and affect. His behavior is normal. Judgment and thought content normal.    ED Course  Procedures (including critical care time) Labs Review Labs Reviewed - No data to display Imaging Review No results found.  MDM  Sciatica, left  Patient with left buttock pain with radiation into the back of left leg x 1 month - c/w sciatica.  No alarming signs to suggest cauda equina or epidural abscess.  Have given an injection of Dilaudid 1mg  IM here and started on oral steroid course.  Has follow up appointment with Dr. Phillips Odor on the 29th.   Izola Price Marisue Humble, [place] 03/30/13 1928

## 2013-03-30 NOTE — ED Provider Notes (Signed)
Medical screening examination/treatment/procedure(s) were performed by non-physician practitioner and as supervising physician I was immediately available for consultation/collaboration.  Vivan Vanderveer, MD 03/30/13 2257 

## 2013-03-30 NOTE — ED Notes (Signed)
Pt c/o left lower back pain that radiates down left leg x1 month. Pt he has been seen and tx for these symptoms previously and states his current medications are not bringing enough relief.

## 2013-03-30 NOTE — ED Notes (Signed)
Pt c/o pain in lower back radiating down left leg x 1 month.  Denies any injury.

## 2013-05-28 ENCOUNTER — Other Ambulatory Visit (HOSPITAL_COMMUNITY): Payer: Self-pay | Admitting: Family Medicine

## 2013-05-30 ENCOUNTER — Other Ambulatory Visit (HOSPITAL_COMMUNITY): Payer: Self-pay | Admitting: Family Medicine

## 2013-05-30 DIAGNOSIS — M5137 Other intervertebral disc degeneration, lumbosacral region: Secondary | ICD-10-CM

## 2013-05-30 DIAGNOSIS — M545 Low back pain: Secondary | ICD-10-CM

## 2013-06-03 ENCOUNTER — Ambulatory Visit (HOSPITAL_COMMUNITY)
Admission: RE | Admit: 2013-06-03 | Discharge: 2013-06-03 | Disposition: A | Discharge status: Home / Self Care | Payer: PRIVATE HEALTH INSURANCE | Source: Ambulatory Visit | Attending: Family Medicine | Admitting: Family Medicine

## 2013-06-03 DIAGNOSIS — M51379 Other intervertebral disc degeneration, lumbosacral region without mention of lumbar back pain or lower extremity pain: Secondary | ICD-10-CM | POA: Insufficient documentation

## 2013-06-03 DIAGNOSIS — M5137 Other intervertebral disc degeneration, lumbosacral region: Secondary | ICD-10-CM | POA: Insufficient documentation

## 2013-06-03 DIAGNOSIS — M47817 Spondylosis without myelopathy or radiculopathy, lumbosacral region: Secondary | ICD-10-CM | POA: Insufficient documentation

## 2013-06-03 DIAGNOSIS — M545 Low back pain, unspecified: Secondary | ICD-10-CM | POA: Insufficient documentation

## 2013-06-03 DIAGNOSIS — M5380 Other specified dorsopathies, site unspecified: Secondary | ICD-10-CM | POA: Insufficient documentation

## 2013-06-03 DIAGNOSIS — M5126 Other intervertebral disc displacement, lumbar region: Secondary | ICD-10-CM | POA: Insufficient documentation

## 2013-09-15 ENCOUNTER — Emergency Department (HOSPITAL_COMMUNITY)
Admission: EM | Admit: 2013-09-15 | Discharge: 2013-09-15 | Disposition: A | Discharge status: Home / Self Care | Payer: PRIVATE HEALTH INSURANCE | Attending: Emergency Medicine | Admitting: Emergency Medicine

## 2013-09-15 ENCOUNTER — Emergency Department (HOSPITAL_COMMUNITY): Payer: PRIVATE HEALTH INSURANCE

## 2013-09-15 ENCOUNTER — Encounter (HOSPITAL_COMMUNITY): Payer: Self-pay | Admitting: Emergency Medicine

## 2013-09-15 DIAGNOSIS — Z8739 Personal history of other diseases of the musculoskeletal system and connective tissue: Secondary | ICD-10-CM | POA: Insufficient documentation

## 2013-09-15 DIAGNOSIS — F411 Generalized anxiety disorder: Secondary | ICD-10-CM | POA: Insufficient documentation

## 2013-09-15 DIAGNOSIS — F17200 Nicotine dependence, unspecified, uncomplicated: Secondary | ICD-10-CM | POA: Insufficient documentation

## 2013-09-15 DIAGNOSIS — E7800 Pure hypercholesterolemia, unspecified: Secondary | ICD-10-CM | POA: Insufficient documentation

## 2013-09-15 DIAGNOSIS — K5900 Constipation, unspecified: Secondary | ICD-10-CM | POA: Insufficient documentation

## 2013-09-15 DIAGNOSIS — K859 Acute pancreatitis without necrosis or infection, unspecified: Secondary | ICD-10-CM | POA: Insufficient documentation

## 2013-09-15 DIAGNOSIS — Z79899 Other long term (current) drug therapy: Secondary | ICD-10-CM | POA: Insufficient documentation

## 2013-09-15 HISTORY — DX: Prediabetes: R73.03

## 2013-09-15 HISTORY — DX: Reserved for concepts with insufficient information to code with codable children: IMO0002

## 2013-09-15 LAB — COMPREHENSIVE METABOLIC PANEL
ALBUMIN: 3.9 g/dL (ref 3.5–5.2)
ALT: 17 U/L (ref 0–53)
AST: 14 U/L (ref 0–37)
Alkaline Phosphatase: 88 U/L (ref 39–117)
BUN: 14 mg/dL (ref 6–23)
CO2: 23 mEq/L (ref 19–32)
CREATININE: 0.68 mg/dL (ref 0.50–1.35)
Calcium: 9.5 mg/dL (ref 8.4–10.5)
Chloride: 97 mEq/L (ref 96–112)
GFR calc Af Amer: >90 mL/min (ref >90)
GFR calc non Af Amer: >90 mL/min (ref >90)
Glucose, Bld: 236 mg/dL — ABNORMAL HIGH (ref 70–99)
Potassium: 3.7 mEq/L (ref 3.7–5.3)
Sodium: 136 mEq/L — ABNORMAL LOW (ref 137–147)
TOTAL PROTEIN: 8.1 g/dL (ref 6.0–8.3)
Total Bilirubin: 0.4 mg/dL (ref 0.3–1.2)

## 2013-09-15 LAB — CBC WITH DIFFERENTIAL/PLATELET
BASOS PCT: 0 % (ref 0–1)
Basophils Absolute: 0 10*3/uL (ref 0.0–0.1)
EOS PCT: 0 % (ref 0–5)
Eosinophils Absolute: 0.1 10*3/uL (ref 0.0–0.7)
HEMATOCRIT: 45.1 % (ref 39.0–52.0)
Hemoglobin: 16.3 g/dL (ref 13.0–17.0)
Lymphocytes Relative: 18 % (ref 12–46)
Lymphs Abs: 2.7 10*3/uL (ref 0.7–4.0)
MCH: 32 pg (ref 26.0–34.0)
MCHC: 36.1 g/dL — AB (ref 30.0–36.0)
MCV: 88.6 fL (ref 78.0–100.0)
MONO ABS: 1.1 10*3/uL — AB (ref 0.1–1.0)
MONOS PCT: 7 % (ref 3–12)
NEUTROS ABS: 10.8 10*3/uL — AB (ref 1.7–7.7)
Neutrophils Relative %: 74 % (ref 43–77)
Platelets: 167 10*3/uL (ref 150–400)
RBC: 5.09 MIL/uL (ref 4.22–5.81)
RDW: 13.1 % (ref 11.5–15.5)
WBC: 14.7 10*3/uL — ABNORMAL HIGH (ref 4.0–10.5)

## 2013-09-15 LAB — URINALYSIS, ROUTINE W REFLEX MICROSCOPIC
BILIRUBIN URINE: NEGATIVE
Ketones, ur: NEGATIVE mg/dL
LEUKOCYTES UA: NEGATIVE
Nitrite: NEGATIVE
PROTEIN: NEGATIVE mg/dL
Specific Gravity, Urine: 1.025 (ref 1.005–1.030)
Urobilinogen, UA: 0.2 mg/dL (ref 0.0–1.0)
pH: 5.5 (ref 5.0–8.0)

## 2013-09-15 LAB — LIPASE, BLOOD: LIPASE: 225 U/L — AB (ref 11–59)

## 2013-09-15 LAB — URINE MICROSCOPIC-ADD ON

## 2013-09-15 MED ORDER — ONDANSETRON HCL 4 MG PO TABS: 4.0000 mg | ORAL_TABLET | Freq: Three times a day (TID) | ORAL | Status: DC | PRN

## 2013-09-15 MED ORDER — SODIUM CHLORIDE 0.9 % IV SOLN
INTRAVENOUS | Status: DC
Start: 2013-09-15 — End: 2013-09-15
  Administered 2013-09-15: 18:00:00 via INTRAVENOUS

## 2013-09-15 MED ORDER — OXYCODONE-ACETAMINOPHEN 5-325 MG PO TABS: ORAL_TABLET | ORAL | Status: DC

## 2013-09-15 MED ORDER — MORPHINE SULFATE 4 MG/ML IJ SOLN
4.0000 mg | INTRAMUSCULAR | Status: DC | PRN
  Administered 2013-09-15: 4 mg via INTRAVENOUS
  Filled 2013-09-15: qty 1

## 2013-09-15 MED ORDER — IOHEXOL 300 MG/ML  SOLN
50.0000 mL | Freq: Once | INTRAMUSCULAR | Status: AC | PRN
  Administered 2013-09-15: 50 mL via ORAL

## 2013-09-15 MED ORDER — IOHEXOL 300 MG/ML  SOLN
100.0000 mL | Freq: Once | INTRAMUSCULAR | Status: AC | PRN
  Administered 2013-09-15: 100 mL via INTRAVENOUS

## 2013-09-15 MED ORDER — ONDANSETRON HCL 4 MG/2ML IJ SOLN
4.0000 mg | INTRAMUSCULAR | Status: DC | PRN
  Administered 2013-09-15: 4 mg via INTRAVENOUS
  Filled 2013-09-15: qty 2

## 2013-09-15 NOTE — ED Notes (Signed)
Pt takes narcotic pain medication for back pain. He has experienced this before but "the exlax usually cleans me out." Rates pain 6/10

## 2013-09-15 NOTE — Discharge Instructions (Signed)
°Emergency Department Resource Guide °1) Find a Doctor and Pay Out of Pocket °Although you won't have to find out who is covered by your insurance plan, it is a good idea to ask around and get recommendations. You will then need to call the office and see if the doctor you have chosen will accept you as a new patient and what types of options they offer for patients who are self-pay. Some doctors offer discounts or will set up payment plans for their patients who do not have insurance, but you will need to ask so you aren't surprised when you get to your appointment. ° °2) Contact Your Local Health Department °Not all health departments have doctors that can see patients for sick visits, but many do, so it is worth a call to see if yours does. If you don't know where your local health department is, you can check in your phone book. The CDC also has a tool to help you locate your state's health department, and many state websites also have listings of all of their local health departments. ° °3) Find a Walk-in Clinic °If your illness is not likely to be very severe or complicated, you may want to try a walk in clinic. These are popping up all over the country in pharmacies, drugstores, and shopping centers. They're usually staffed by nurse practitioners or physician assistants that have been trained to treat common illnesses and complaints. They're usually fairly quick and inexpensive. However, if you have serious medical issues or chronic medical problems, these are probably not your best option. ° °No Primary Care Doctor: °- Call [facility] at  832-8000 - they can help you locate a primary care doctor that  accepts your insurance, provides certain services, etc. °- Physician Referral Service- 1-800-533-3463 ° °Chronic Pain Problems: °Organization         Address  Phone   Notes  °[facility]  (336) [phone] Patients need to be referred by their primary care doctor.  ° °Medication  Assistance: °Organization         Address  Phone   Notes  °[place] [facility] [address] °[place], [place] [address] (336) 641-8030 --Must be a resident of [place] °-- Must have NO insurance coverage whatsoever (no Medicaid/ Medicare, etc.) °-- The pt. MUST have a primary care doctor that directs their care regularly and follows them in the community °  °[facility]  (866) 331-1348   °[employer]  (888) 892-1162   ° °Agencies that provide inexpensive medical care: °Organization         Address  Phone   Notes  °[facility]  (336) 832-8035   °[facility]    (336) 832-7272   °[facility] [address] °[place],  [address] (336) 832-4777   °[facility] [address], °[place] (336) 271-4999   °[facility]    (336) 373-0678   °[facility]    (336) 272-1050   °[facility] ° [address], [place] Phone:  (336) 832-4444, Fax:  (336) 832-4440 Hours of Operation:  9 am - 6 pm, M-F.  Also accepts Medicaid/Medicare and self-pay.  °[facility] ° [address], [place] Phone: (336) 832-3150, Fax: (336) [phone]. Hours of Operation:  8:30 am - 5:30 pm, M-F.  Also accepts Medicaid and self-pay.  °[facility] [place] [address], [place] Phone: (336) 878-6027   °[facility] [address], [place], [place] (336)723-1848, Ext. 123 Mondays & Thursdays: 7-9 AM.  First 15 patients are seen on a first come, first serve basis. °  ° °Medicaid-accepting [place] Providers: ° °Organization         Address  Phone   Notes  °[facility] [address], [place] (336) 641-2100 Also accepts self-pay patients.  °[facility] [address], [place] ° (336) 856-9996   °[facility] [address], [place]  (336) 288-8857   °[facility] [address] [place] [address], [place] (336) 299-7000   °Veita Bland [address], [place]  ° (336) 373-1557 Only accepts [place] Access Medicaid patients after they have their name applied to their card.  ° °Self-Pay (no insurance) in [place]: ° °Organization         Address  Phone   Notes  °Sickle Cell Patients, [facility] [address], [place] (336) 832-1970   °[facility] [address], [place] (336) 832-4400   °[facility] ° [address], [place] (336) 992-4800   °[facility]/Dr. Osei-Bonsu ° [address] [place] [address], [place] or [address], [place] (336) [phone] Phone number for both [place] and [place] locations is the same.  °[facility] [address], [place] (336) 299-0000   °[facility] [address] [place] [address], [place] or [address] (336) 852-7530 °(336) 878-2260   °[facility] [address], [place] (336) 350-1642, phone; (336) 294-5005, fax Sees patients 1st and 3rd Saturday of every month.  Must not qualify for public or private insurance (i.e. Medicaid, Medicare, [place] Health Choice, Veterans' Benefits) • Household income should be no more than 200% of the poverty level •The clinic cannot treat you if you are pregnant or think you are pregnant • Sexually transmitted diseases are not treated at the clinic.  ° ° °Dental Care: °Organization         Address  Phone  Notes  °[place] [facility] [address],  (336) 641-6152 Accepts children up to age 21 who are enrolled in Medicaid or [place] Health Choice; pregnant women with a Medicaid card; and children who have applied for Medicaid or [place] Health Choice, but were declined, whose parents can pay a reduced fee at time of service.  °[place]  [facility] [place]  [address], [place] (336) 641-7733 Accepts children up to age 21 who are enrolled in Medicaid or [place] Health Choice; pregnant women with a Medicaid card; and children who have applied for Medicaid or  Health Choice, but were declined, whose parents can pay a reduced fee at time of service.  °[facility] ° [address],  (336) 641-4533 Patients are seen by appointment only. Walk-ins are not accepted. [facility] will see patients 18 years of age and older. °Monday - Tuesday (8am-5pm) °Most Wednesdays (8:30-5pm) °$30 per visit, cash only  °[facility] ° [address], [place] (336) 641-4533 Patients are seen by appointment only. Walk-ins are not accepted. [facility] will see patients 18 years of age and older. °One   Wednesday Evening (Monthly: Volunteer Based).  $30 per visit, cash only  °[facility]  (919) 537-3737 for adults; Children under age 4, call Graduate Pediatric Dentistry at ([phone]) [phone]. Children aged 4-14, please call ([phone]) [phone] to request a pediatric application. ° Dental services are provided in all areas of dental care including fillings, crowns and bridges, complete and partial dentures, implants, gum treatment, root canals, and extractions. Preventive care is also provided. Treatment is provided to both adults and children. °Patients are selected via a lottery and there is often a waiting list. °  °[facility] [address], °[place] ° (336) 763-8833 [email or web address] °  °[facility] [address], [place], [place] (336)723-1848, Ext. 123 Second and Fourth Thursday of each month, opens at 6:30 AM; Clinic ends at 9 AM.  Patients are seen on a first-come first-served basis, and a limited number are seen during each clinic.  ° °[facility] ° [address], [place], [place] (336) [phone]    Eligibility Requirements °You must have lived in [place], [place], or [place] counties for at least the last three months. °  You cannot be eligible for state or federal sponsored healthcare insurance, including Veterans Administration, Medicaid, or Medicare. °  You generally cannot be eligible for healthcare insurance through your employer.  °  How to apply: °Eligibility screenings are held every Tuesday and Wednesday afternoon from 1:00 pm until 4:00 pm. You do not need an appointment for the interview!  °[facility] [address], [place], [place] 336-631-2330   °[facility]  336-342-8273   °[facility]  336-703-3100   °[facility]  336-570-6415   ° °Behavioral Health Resources in the Community: °Intensive Outpatient Programs °Organization         Address  Phone  Notes  °[facility] [address], [place], [place] 336-878-6098   °[facility] [address], [place], [place] 336-832-9800   °[facility] [address], [place], [place] ° 336-882-2125   °[facility] [address],  °[place], [place] 1-800-853-5163 or 336-641-4981   °Substance Abuse Resources °Organization         Address  Phone  Notes  °[facility]  336-882-2125   °[facility]  336-784-9470   °[facility]  336-285-9073   °[facility]  336-845-3988   °Residential & Outpatient Substance Abuse Program  1-800-659-3381   °Psychological Services °Organization         Address  Phone  Notes  °[facility]  336- 832-9600   °[facility]  336- 378-7881   °[facility] [address], [place] 1-800-853-5163 or 336-641-4981   ° °Mobile Crisis Teams °Organization         Address  Phone  Notes  °[facility], Mobile Crisis Care Unit  1-877-626-1772   °Assertive °[facility] ° [address]  [place], [place] 336-834-9664   °Sharon DeEsch [address] °[place] 336-554-5454   ° °Self-Help/Support Groups °Organization         Address  Phone             Notes  °[facility] - variety of support groups  336- 373-1402 Call for more information  °Narcotics Anonymous (NA), [facility] [address], °[place]   2 meetings at this location  ° °  Residential Treatment Programs Organization         Address  Phone  Notes  [facility] [address],    [place]  1-504-503-5703   [facility]  [address] [place] [address], [place], [place]   [place], [place] [phone] Admissions: 8am-3pm M-F  [facility] [place] [address]    [place], [place] [phone]   [facility] [address] [place], [place], [place]   [facility] [address]  [place], [employer]   [facility] Intensive Outpatient [address], [place], [place]   [facility] [place].,  [place], [place] 1-(225) 255-4513 or [phone]   [facility] [address] [place], [place] Accepts Medicaid  [facility] [address],  [place] [phone] Substance Abuse/Addiction Treatment   [facility] Organization         Address  Phone  Notes  [facility]  (862) 704-1467   Domenic Schwab, PhD [address] [place], [place]   613-346-0710 or (631)[phone]   [place] [employer], [place] [phone]   [facility] [address], [place], [place] [phone] Insurance/Medicaid/sponsorship through [facility] [address]                                    [place], [place] [phone] [place] [address], [place] [phone]    Dr. Adele Schilder  [phone]   [facility] Dept. 1) [address], [place] 2) [place] 3)  [place] [address], [place] [phone]  [phone]   [place] ([phone] or [phone] (After Hours)       Take the prescriptions as directed.  Take in a liquid diet for the next 1 to 2 days, then eat a bland diet and advance to a low fat diet slowly as you can tolerate it.  Call your regular medical doctor tomorrow morning to schedule a follow up appointment in the next 1 to 2 days. Call the GI doctor tomorrow to schedule a follow up appointment this week.  Return to the Emergency Department immediately if not improving (or even worsening) despite taking the medicines as prescribed, any black or bloody stool or vomit, if you develop a fever over "101," or for any other concerns.

## 2013-09-15 NOTE — ED Provider Notes (Signed)
CSN: [id number]     Arrival date & time 09/15/13  1735 History   First MD Initiated Contact with Patient 09/15/13 1753     Chief Complaint  Patient presents with  . Abdominal Pain      HPI Pt was seen at 1755.  Per pt, c/o gradual onset and persistence of constant LUQ abd "pain" since yesterday.  Has been associated with constipation.  Describes the abd pain as "throbbing." States he took a laxative without improvement. Has been taking his usual chronic narcotic pain medication (oxycodone/APAP) without relief of pain. Denies N/VD, no fevers, no back pain, no rash, no CP/SOB, no black or blood in stools.       Past Medical History  Diagnosis Date  . Hypercholesteremia   . Anxiety   . Bulging disc   . Borderline diabetes    Past Surgical History  Procedure Laterality Date  . Appendectomy    . Abdominal surgery     Family History  Problem Relation Age of Onset  . Diabetes Father    History  Substance Use Topics  . Smoking status: Current Every Day Smoker -- 0.50 packs/day for 30 years    Types: Cigarettes  . Smokeless tobacco: Never Used  . Alcohol Use: No    Review of Systems ROS: Statement: All systems negative except as marked or noted in the HPI; Constitutional: Negative for fever and chills. ; ; Eyes: Negative for eye pain, redness and discharge. ; ; ENMT: Negative for ear pain, hoarseness, nasal congestion, sinus pressure and sore throat. ; ; Cardiovascular: Negative for chest pain, palpitations, diaphoresis, dyspnea and peripheral edema. ; ; Respiratory: Negative for cough, wheezing and stridor. ; ; Gastrointestinal: +abd pain, constipation. Negative for nausea, vomiting, diarrhea, blood in stool, hematemesis, jaundice and rectal bleeding. . ; ; Genitourinary: Negative for dysuria, flank pain and hematuria. ; ; Genital:  No penile drainage or rash, no testicular pain or swelling, no scrotal rash or swelling.;; Musculoskeletal: Negative for back pain and neck pain. Negative  for swelling and trauma.; ; Skin: Negative for pruritus, rash, abrasions, blisters, bruising and skin lesion.; ; Neuro: Negative for headache, lightheadedness and neck stiffness. Negative for weakness, altered level of consciousness , altered mental status, extremity weakness, paresthesias, involuntary movement, seizure and syncope.      Allergies  Review of patient's allergies indicates no known allergies.  Home Medications   Current Outpatient Rx  Name  Route  Sig  Dispense  Refill  . ALPRAZolam (XANAX) 1 MG tablet   Oral   Take 1 mg by mouth 3 (three) times daily.           . cyclobenzaprine (FLEXERIL) 5 MG tablet   Oral   Take 5 mg by mouth 3 (three) times daily as needed for muscle spasms.         Marland Kitchen oxyCODONE-acetaminophen (PERCOCET/ROXICET) 5-325 MG per tablet   Oral   Take 1 tablet by mouth every 4 (four) hours as needed for moderate pain or severe pain.         . pravastatin (PRAVACHOL) 40 MG tablet   Oral   Take 40 mg by mouth daily.         . Sennosides 25 MG TABS   Oral   Take 75 mg by mouth once as needed.         . ondansetron (ZOFRAN) 4 MG tablet   Oral   Take 1 tablet (4 mg total) by mouth every 8 (eight) hours  as needed for nausea or vomiting.   6 tablet   0   . oxyCODONE-acetaminophen (PERCOCET/ROXICET) 5-325 MG per tablet      1 or 2 tabs PO q6h prn pain   25 tablet   0    BP 120/78  Pulse 74  Temp(Src) 98 F (36.7 C) (Oral)  Resp 20  Ht 6\' 1"  (1.854 m)  Wt 240 lb (108.863 kg)  BMI 31.67 kg/m2  SpO2 98% Physical Exam 1800: Physical examination:  Nursing notes reviewed; Vital signs and O2 SAT reviewed;  Constitutional: Well developed, Well nourished, Well hydrated, In no acute distress; Head:  Normocephalic, atraumatic; Eyes: EOMI, PERRL, No scleral icterus; ENMT: Mouth and pharynx normal, Mucous membranes moist; Neck: Supple, Full range of motion, No lymphadenopathy; Cardiovascular: Regular rate and rhythm, No murmur, rub, or gallop;  Respiratory: Breath sounds clear & equal bilaterally, No rales, rhonchi, wheezes.  Speaking full sentences with ease, Normal respiratory effort/excursion; Chest: Nontender, Movement normal; Abdomen: Soft, +mid-epigastric, LUQ > LLQ tenderness to palp. No rebound or guarding. Nondistended, Normal bowel sounds; Genitourinary: No CVA tenderness; Extremities: Pulses normal, No tenderness, No edema, No calf edema or asymmetry.; Neuro: AA&Ox3, Major CN grossly intact.  Speech clear. No gross focal motor or sensory deficits in extremities. Climbs on and off stretcher easily by himself. Gait steady.; Skin: Color normal, Warm, Dry.   ED Course  Procedures     EKG Interpretation None      MDM  MDM Reviewed: previous chart, nursing note and vitals Reviewed previous: labs Interpretation: labs, x-ray and CT scan   Results for orders placed during the hospital encounter of 09/15/13  URINALYSIS, ROUTINE W REFLEX MICROSCOPIC      Result Value Ref Range   Color, Urine YELLOW  YELLOW   APPearance CLEAR  CLEAR   Specific Gravity, Urine 1.025  1.005 - 1.030   pH 5.5  5.0 - 8.0   Glucose, UA >1000 (*) NEGATIVE mg/dL   Hgb urine dipstick TRACE (*) NEGATIVE   Bilirubin Urine NEGATIVE  NEGATIVE   Ketones, ur NEGATIVE  NEGATIVE mg/dL   Protein, ur NEGATIVE  NEGATIVE mg/dL   Urobilinogen, UA 0.2  0.0 - 1.0 mg/dL   Nitrite NEGATIVE  NEGATIVE   Leukocytes, UA NEGATIVE  NEGATIVE  CBC WITH DIFFERENTIAL      Result Value Ref Range   WBC 14.7 (*) 4.0 - 10.5 K/uL   RBC 5.09  4.22 - 5.81 MIL/uL   Hemoglobin 16.3  13.0 - 17.0 g/dL   HCT 45.1  39.0 - 52.0 %   MCV 88.6  78.0 - 100.0 fL   MCH 32.0  26.0 - 34.0 pg   MCHC 36.1 (*) 30.0 - 36.0 g/dL   RDW 13.1  11.5 - 15.5 %   Platelets 167  150 - 400 K/uL   Neutrophils Relative % 74  43 - 77 %   Neutro Abs 10.8 (*) 1.7 - 7.7 K/uL   Lymphocytes Relative 18  12 - 46 %   Lymphs Abs 2.7  0.7 - 4.0 K/uL   Monocytes Relative 7  3 - 12 %   Monocytes Absolute 1.1  (*) 0.1 - 1.0 K/uL   Eosinophils Relative 0  0 - 5 %   Eosinophils Absolute 0.1  0.0 - 0.7 K/uL   Basophils Relative 0  0 - 1 %   Basophils Absolute 0.0  0.0 - 0.1 K/uL  COMPREHENSIVE METABOLIC PANEL      Result Value Ref Range  Sodium 136 (*) 137 - 147 mEq/L   Potassium 3.7  3.7 - 5.3 mEq/L   Chloride 97  96 - 112 mEq/L   CO2 23  19 - 32 mEq/L   Glucose, Bld 236 (*) 70 - 99 mg/dL   BUN 14  6 - 23 mg/dL   Creatinine, Ser 0.68  0.50 - 1.35 mg/dL   Calcium 9.5  8.4 - 10.5 mg/dL   Total Protein 8.1  6.0 - 8.3 g/dL   Albumin 3.9  3.5 - 5.2 g/dL   AST 14  0 - 37 U/L   ALT 17  0 - 53 U/L   Alkaline Phosphatase 88  39 - 117 U/L   Total Bilirubin 0.4  0.3 - 1.2 mg/dL   GFR calc non Af Amer >90  >90 mL/min   GFR calc Af Amer >90  >90 mL/min  LIPASE, BLOOD      Result Value Ref Range   Lipase 225 (*) 11 - 59 U/L  URINE MICROSCOPIC-ADD ON      Result Value Ref Range   RBC / HPF 0-2  <3 RBC/hpf   Dg Chest 2 View 09/15/2013   CLINICAL DATA:  Abdominal pain.  EXAM: CHEST  2 VIEW  COMPARISON:  December 17, 2010.  FINDINGS: The heart size and mediastinal contours are within normal limits. Both lungs are clear. No pneumothorax or pleural effusion is noted. The visualized skeletal structures are unremarkable.  IMPRESSION: No acute cardiopulmonary abnormality seen.   Electronically Signed   By: Sabino Dick M.D.   On: 09/15/2013 19:03   Ct Abdomen Pelvis W Contrast 09/15/2013   CLINICAL DATA:  Abdominal pain.  EXAM: CT ABDOMEN AND PELVIS WITH CONTRAST  TECHNIQUE: Multidetector CT imaging of the abdomen and pelvis was performed using the standard protocol following bolus administration of intravenous contrast.  CONTRAST:  20mL OMNIPAQUE IOHEXOL 300 MG/ML SOLN, 112mL OMNIPAQUE IOHEXOL 300 MG/ML SOLN  COMPARISON:  None.  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 Visualized lung bases appear normal.  The liver and spleen appear normal. No gallstones are noted. Inflammatory changes  are noted around pancreatic tail consistent with pancreatitis. No pseudocyst is noted. Adrenal glands and kidneys appear normal. No hydronephrosis or renal obstruction is noted. No renal or ureteral calculi is noted. Status post appendectomy. There is no evidence of bowel obstruction. No abnormal fluid collection is noted. Urinary bladder appears normal. Mild sigmoid diverticulosis is noted without inflammation. Mildly enlarged lymph nodes are noted in the retroperitoneal region, all of which were less than 1 cm in size. These most likely are benign or reactive in origin.  IMPRESSION: Findings consistent with acute pancreatitis involving the tail of the pancreas. No pseudocyst is noted.   Electronically Signed   By: Sabino Dick M.D.   On: 09/15/2013 19:30     2025:  Pt has tol PO well without N/V. No stooling while in the ED. Dx and testing d/w pt.  Questions answered.  Verb understanding. Offered pt admission for acute pancreatitis. States he "feels better" and wants to go home; refuses admission.  The ED RN and I encouraged pt to stay, continues to refuse.  Pt makes his own medical decisions. Pt states he "can manage at home with some pain medicines."  Will rx pain meds and anti-emetics. Diet reviewed. He is agreeable to f/u with his PMD tomorrow and return to ED immediately sooner if worsening.         Wannetta Sender  Thurnell Garbe, DO 09/18/13 1340

## 2013-09-15 NOTE — ED Notes (Signed)
Pt aware that he will not be able to drive for 4 hours post morphine dose.

## 2013-09-15 NOTE — ED Notes (Addendum)
Patient reports taking 3 Exlax last night for constipation. Denies good results. Patient can not recall last normal BM. Patient now reports left upper abd pain that radiates into chest. Denies any blood in stool that he did have. 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 Reports taking hydrocodone and "muscle relaxer".

## 2013-09-17 LAB — URINE CULTURE
COLONY COUNT: NO GROWTH
CULTURE: NO GROWTH

## 2014-01-16 ENCOUNTER — Encounter (HOSPITAL_COMMUNITY): Payer: Self-pay | Admitting: Emergency Medicine

## 2014-01-16 ENCOUNTER — Emergency Department (HOSPITAL_COMMUNITY)
Admission: EM | Admit: 2014-01-16 | Discharge: 2014-01-16 | Disposition: A | Discharge status: Home / Self Care | Payer: PRIVATE HEALTH INSURANCE | Attending: Emergency Medicine | Admitting: Emergency Medicine

## 2014-01-16 DIAGNOSIS — Z9889 Other specified postprocedural states: Secondary | ICD-10-CM | POA: Insufficient documentation

## 2014-01-16 DIAGNOSIS — Z9089 Acquired absence of other organs: Secondary | ICD-10-CM | POA: Insufficient documentation

## 2014-01-16 DIAGNOSIS — E7800 Pure hypercholesterolemia, unspecified: Secondary | ICD-10-CM | POA: Insufficient documentation

## 2014-01-16 DIAGNOSIS — Z79899 Other long term (current) drug therapy: Secondary | ICD-10-CM | POA: Insufficient documentation

## 2014-01-16 DIAGNOSIS — Z8739 Personal history of other diseases of the musculoskeletal system and connective tissue: Secondary | ICD-10-CM | POA: Insufficient documentation

## 2014-01-16 DIAGNOSIS — F17200 Nicotine dependence, unspecified, uncomplicated: Secondary | ICD-10-CM | POA: Insufficient documentation

## 2014-01-16 DIAGNOSIS — Z8719 Personal history of other diseases of the digestive system: Secondary | ICD-10-CM | POA: Insufficient documentation

## 2014-01-16 DIAGNOSIS — E119 Type 2 diabetes mellitus without complications: Secondary | ICD-10-CM | POA: Insufficient documentation

## 2014-01-16 DIAGNOSIS — F411 Generalized anxiety disorder: Secondary | ICD-10-CM | POA: Insufficient documentation

## 2014-01-16 DIAGNOSIS — R109 Unspecified abdominal pain: Secondary | ICD-10-CM

## 2014-01-16 HISTORY — DX: Acute pancreatitis without necrosis or infection, unspecified: K85.90

## 2014-01-16 HISTORY — DX: Type 2 diabetes mellitus without complications: E11.9

## 2014-01-16 LAB — CBC WITH DIFFERENTIAL/PLATELET
BASOS PCT: 0 % (ref 0–1)
Basophils Absolute: 0 10*3/uL (ref 0.0–0.1)
Eosinophils Absolute: 0.1 10*3/uL (ref 0.0–0.7)
Eosinophils Relative: 1 % (ref 0–5)
HEMATOCRIT: 40.8 % (ref 39.0–52.0)
HEMOGLOBIN: 14.1 g/dL (ref 13.0–17.0)
LYMPHS PCT: 17 % (ref 12–46)
Lymphs Abs: 2.1 10*3/uL (ref 0.7–4.0)
MCH: 30.9 pg (ref 26.0–34.0)
MCHC: 34.6 g/dL (ref 30.0–36.0)
MCV: 89.5 fL (ref 78.0–100.0)
MONO ABS: 0.8 10*3/uL (ref 0.1–1.0)
Monocytes Relative: 6 % (ref 3–12)
NEUTROS ABS: 9.7 10*3/uL — AB (ref 1.7–7.7)
NEUTROS PCT: 76 % (ref 43–77)
Platelets: 134 10*3/uL — ABNORMAL LOW (ref 150–400)
RBC: 4.56 MIL/uL (ref 4.22–5.81)
RDW: 13.9 % (ref 11.5–15.5)
WBC: 12.8 10*3/uL — AB (ref 4.0–10.5)

## 2014-01-16 LAB — URINALYSIS, ROUTINE W REFLEX MICROSCOPIC
BILIRUBIN URINE: NEGATIVE
Glucose, UA: >1000 mg/dL — AB
KETONES UR: NEGATIVE mg/dL
Leukocytes, UA: NEGATIVE
Nitrite: NEGATIVE
PH: 5.5 (ref 5.0–8.0)
Protein, ur: NEGATIVE mg/dL
SPECIFIC GRAVITY, URINE: 1.025 (ref 1.005–1.030)
Urobilinogen, UA: 0.2 mg/dL (ref 0.0–1.0)

## 2014-01-16 LAB — COMPREHENSIVE METABOLIC PANEL
ALBUMIN: 3.8 g/dL (ref 3.5–5.2)
ALK PHOS: 90 U/L (ref 39–117)
ALT: 19 U/L (ref 0–53)
AST: 15 U/L (ref 0–37)
Anion gap: 12 (ref 5–15)
BILIRUBIN TOTAL: 0.3 mg/dL (ref 0.3–1.2)
BUN: 9 mg/dL (ref 6–23)
CHLORIDE: 101 meq/L (ref 96–112)
CO2: 24 meq/L (ref 19–32)
CREATININE: 0.7 mg/dL (ref 0.50–1.35)
Calcium: 9 mg/dL (ref 8.4–10.5)
GFR calc Af Amer: >90 mL/min (ref >90)
Glucose, Bld: 235 mg/dL — ABNORMAL HIGH (ref 70–99)
POTASSIUM: 3.9 meq/L (ref 3.7–5.3)
Sodium: 137 mEq/L (ref 137–147)
Total Protein: 7.5 g/dL (ref 6.0–8.3)

## 2014-01-16 LAB — URINE MICROSCOPIC-ADD ON

## 2014-01-16 LAB — LIPASE, BLOOD: LIPASE: 62 U/L — AB (ref 11–59)

## 2014-01-16 MED ORDER — MORPHINE SULFATE 4 MG/ML IJ SOLN
4.0000 mg | Freq: Once | INTRAMUSCULAR | Status: AC
Start: 2014-01-16 — End: 2014-01-16
  Administered 2014-01-16: 4 mg via INTRAVENOUS
  Filled 2014-01-16: qty 1

## 2014-01-16 MED ORDER — OXYCODONE-ACETAMINOPHEN 5-325 MG PO TABS: 1.0000 | ORAL_TABLET | Freq: Four times a day (QID) | ORAL | Status: DC | PRN

## 2014-01-16 MED ORDER — PROMETHAZINE HCL 25 MG PO TABS: 25.0000 mg | ORAL_TABLET | Freq: Four times a day (QID) | ORAL | Status: DC | PRN

## 2014-01-16 MED ORDER — SODIUM CHLORIDE 0.9 % IV BOLUS (SEPSIS)
1000.0000 mL | Freq: Once | INTRAVENOUS | Status: AC
  Administered 2014-01-16: 1000 mL via INTRAVENOUS

## 2014-01-16 MED ORDER — ONDANSETRON HCL 4 MG/2ML IJ SOLN
4.0000 mg | Freq: Once | INTRAMUSCULAR | Status: AC
  Administered 2014-01-16: 4 mg via INTRAVENOUS
  Filled 2014-01-16: qty 2

## 2014-01-16 NOTE — Discharge Instructions (Signed)
Clear liquids, pain meds, nausea meds.   Return here if getting worse.

## 2014-01-16 NOTE — ED Notes (Signed)
Pt verbalized understanding of NO Driving within 4 hours of taking Percocet due to med causes drowsiness, pt also made aware that med causes constipation

## 2014-01-16 NOTE — ED Notes (Signed)
C/O LUQ pain/epigastric pain that goes into his chest. Pt reports "it feels like my pancreas acting up again".

## 2014-01-16 NOTE — ED Notes (Signed)
Instructed pt for urine sample 

## 2014-01-16 NOTE — ED Provider Notes (Signed)
CSN: [id number]     Arrival date & time 01/16/14  1139 History   First MD Initiated Contact with Patient 01/16/14 1235     Chief Complaint  Patient presents with  . Abdominal Pain     (Consider location/radiation/quality/duration/timing/severity/associated sxs/prior Treatment) HPI..... Vague left-sided abdominal pain for 24 hours. Patient is eating without vomiting or diarrhea. No fever, chills, dysuria, cough, chest pain, dyspnea. Is concerned about pancreatitis. No alcohol consumption. Severity is mild. Nothing makes sxs better or worse. No radiation of pain.  Past Medical History  Diagnosis Date  . Hypercholesteremia   . Anxiety   . Bulging disc   . Borderline diabetes   . Pancreatitis   . Diabetes mellitus without complication    Past Surgical History  Procedure Laterality Date  . Appendectomy    . Abdominal surgery    . Back surgery     Family History  Problem Relation Age of Onset  . Diabetes Father    History  Substance Use Topics  . Smoking status: Current Every Day Smoker -- 0.50 packs/day for 30 years    Types: Cigarettes  . Smokeless tobacco: Never Used  . Alcohol Use: No    Review of Systems  All other systems reviewed and are negative.     Allergies  Review of patient's allergies indicates no known allergies.  Home Medications   Prior to Admission medications   Medication Sig Start Date End Date Taking? Authorizing Provider  ALPRAZolam Duanne Moron) 1 MG tablet Take 1 mg by mouth 3 (three) times daily.     Yes Historical Provider, MD  cyclobenzaprine (FLEXERIL) 5 MG tablet Take 5 mg by mouth 3 (three) times daily as needed for muscle spasms.   Yes Historical Provider, MD  metFORMIN (GLUCOPHAGE) 500 MG tablet Take 500 mg by mouth 2 (two) times daily with a meal.   Yes Historical Provider, MD  oxyCODONE-acetaminophen (PERCOCET/ROXICET) 5-325 MG per tablet Take 1 tablet by mouth every 4 (four) hours as needed for moderate pain or severe pain.   Yes Historical  Provider, MD  pravastatin (PRAVACHOL) 40 MG tablet Take 40 mg by mouth daily.   Yes Historical Provider, MD  oxyCODONE-acetaminophen (PERCOCET/ROXICET) 5-325 MG per tablet Take 1-2 tablets by mouth every 6 (six) hours as needed. 01/16/14   Nat Christen, MD  promethazine (PHENERGAN) 25 MG tablet Take 1 tablet (25 mg total) by mouth every 6 (six) hours as needed. 01/16/14   Nat Christen, MD   BP 126/83  Pulse 66  Temp(Src) 98.9 F (37.2 C) (Oral)  Resp 14  Ht 6\' 1"  (1.854 m)  Wt 232 lb (105.235 kg)  BMI 30.62 kg/m2  SpO2 98% Physical Exam  Nursing note and vitals reviewed. Constitutional: He is oriented to person, place, and time. He appears well-developed and well-nourished.  HENT:  Head: Normocephalic and atraumatic.  Eyes: Conjunctivae and EOM are normal. Pupils are equal, round, and reactive to light.  Neck: Normal range of motion. Neck supple.  Cardiovascular: Normal rate, regular rhythm and normal heart sounds.   Pulmonary/Chest: Effort normal and breath sounds normal.  Abdominal: Soft. Bowel sounds are normal.  Minimal tenderness mid left anterior lateral abdomen  Musculoskeletal: Normal range of motion.  Neurological: He is alert and oriented to person, place, and time.  Skin: Skin is warm and dry.  Psychiatric: He has a normal mood and affect. His behavior is normal.    ED Course  Procedures (including critical care time) Labs Review Labs Reviewed  CBC  WITH DIFFERENTIAL - Abnormal; Notable for the following:    WBC 12.8 (*)    Platelets 134 (*)    Neutro Abs 9.7 (*)    All other components within normal limits  COMPREHENSIVE METABOLIC PANEL - Abnormal; Notable for the following:    Glucose, Bld 235 (*)    All other components within normal limits  LIPASE, BLOOD - Abnormal; Notable for the following:    Lipase 62 (*)    All other components within normal limits  URINALYSIS, ROUTINE W REFLEX MICROSCOPIC - Abnormal; Notable for the following:    Glucose, UA >1000 (*)     Hgb urine dipstick TRACE (*)    All other components within normal limits  URINE MICROSCOPIC-ADD ON - Abnormal; Notable for the following:    Squamous Epithelial / LPF FEW (*)    All other components within normal limits    Imaging Review No results found.   EKG Interpretation   Date/Time:  Thursday January 16 2014 12:03:41 EDT Ventricular Rate:  67 PR Interval:  172 QRS Duration: 92 QT Interval:  397 QTC Calculation: 419 R Axis:   38 Text Interpretation:  Sinus rhythm Low voltage, precordial leads Confirmed  by Versa Craton  MD, Alfonso Carden ([id number]) on 01/16/2014 2:18:57 PM      MDM   Final diagnoses:  Abdominal pain, unspecified abdominal location    No acute abdomen. Patient feels better after IV hydration and pain management. Lipase 62.  He feels better at discharge. Discharge medications Percocet and Phenergan 25 mg    Nat Christen, MD 01/17/14 [phone]

## 2014-01-19 ENCOUNTER — Encounter (HOSPITAL_COMMUNITY): Payer: Self-pay | Admitting: Emergency Medicine

## 2014-01-19 ENCOUNTER — Emergency Department (HOSPITAL_COMMUNITY): Payer: PRIVATE HEALTH INSURANCE

## 2014-01-19 ENCOUNTER — Inpatient Hospital Stay (HOSPITAL_COMMUNITY)
Admission: EM | Admit: 2014-01-19 | Discharge: 2014-01-21 | DRG: 440 | Disposition: A | Discharge status: Home / Self Care | Payer: PRIVATE HEALTH INSURANCE | Attending: Internal Medicine | Admitting: Internal Medicine

## 2014-01-19 DIAGNOSIS — Z23 Encounter for immunization: Secondary | ICD-10-CM

## 2014-01-19 DIAGNOSIS — Z833 Family history of diabetes mellitus: Secondary | ICD-10-CM

## 2014-01-19 DIAGNOSIS — E7800 Pure hypercholesterolemia, unspecified: Secondary | ICD-10-CM | POA: Diagnosis present

## 2014-01-19 DIAGNOSIS — R0789 Other chest pain: Secondary | ICD-10-CM | POA: Diagnosis present

## 2014-01-19 DIAGNOSIS — K859 Acute pancreatitis without necrosis or infection, unspecified: Principal | ICD-10-CM

## 2014-01-19 DIAGNOSIS — E119 Type 2 diabetes mellitus without complications: Secondary | ICD-10-CM

## 2014-01-19 DIAGNOSIS — E785 Hyperlipidemia, unspecified: Secondary | ICD-10-CM | POA: Diagnosis present

## 2014-01-19 DIAGNOSIS — E1169 Type 2 diabetes mellitus with other specified complication: Secondary | ICD-10-CM

## 2014-01-19 DIAGNOSIS — F411 Generalized anxiety disorder: Secondary | ICD-10-CM | POA: Diagnosis present

## 2014-01-19 DIAGNOSIS — E781 Pure hyperglyceridemia: Secondary | ICD-10-CM | POA: Diagnosis present

## 2014-01-19 DIAGNOSIS — F17200 Nicotine dependence, unspecified, uncomplicated: Secondary | ICD-10-CM | POA: Diagnosis present

## 2014-01-19 DIAGNOSIS — E782 Mixed hyperlipidemia: Secondary | ICD-10-CM

## 2014-01-19 LAB — COMPREHENSIVE METABOLIC PANEL
ALK PHOS: 85 U/L (ref 39–117)
ALT: 17 U/L (ref 0–53)
AST: 17 U/L (ref 0–37)
Albumin: 3.5 g/dL (ref 3.5–5.2)
Anion gap: 12 (ref 5–15)
BUN: 13 mg/dL (ref 6–23)
CALCIUM: 9.5 mg/dL (ref 8.4–10.5)
CO2: 27 mEq/L (ref 19–32)
Chloride: 98 mEq/L (ref 96–112)
Creatinine, Ser: 0.77 mg/dL (ref 0.50–1.35)
GFR calc Af Amer: >90 mL/min (ref >90)
GFR calc non Af Amer: >90 mL/min (ref >90)
Glucose, Bld: 174 mg/dL — ABNORMAL HIGH (ref 70–99)
Potassium: 4.1 mEq/L (ref 3.7–5.3)
SODIUM: 137 meq/L (ref 137–147)
TOTAL PROTEIN: 7.6 g/dL (ref 6.0–8.3)
Total Bilirubin: 0.3 mg/dL (ref 0.3–1.2)

## 2014-01-19 LAB — CBC WITH DIFFERENTIAL/PLATELET
Basophils Absolute: 0 10*3/uL (ref 0.0–0.1)
Basophils Relative: 0 % (ref 0–1)
Eosinophils Absolute: 0.2 10*3/uL (ref 0.0–0.7)
Eosinophils Relative: 2 % (ref 0–5)
HCT: 44.3 % (ref 39.0–52.0)
HEMOGLOBIN: 15.4 g/dL (ref 13.0–17.0)
LYMPHS ABS: 2.7 10*3/uL (ref 0.7–4.0)
Lymphocytes Relative: 24 % (ref 12–46)
MCH: 31.4 pg (ref 26.0–34.0)
MCHC: 34.8 g/dL (ref 30.0–36.0)
MCV: 90.2 fL (ref 78.0–100.0)
MONOS PCT: 7 % (ref 3–12)
Monocytes Absolute: 0.8 10*3/uL (ref 0.1–1.0)
Neutro Abs: 7.6 10*3/uL (ref 1.7–7.7)
Neutrophils Relative %: 67 % (ref 43–77)
Platelets: 154 10*3/uL (ref 150–400)
RBC: 4.91 MIL/uL (ref 4.22–5.81)
RDW: 13.9 % (ref 11.5–15.5)
WBC: 11.3 10*3/uL — ABNORMAL HIGH (ref 4.0–10.5)

## 2014-01-19 LAB — GLUCOSE, CAPILLARY: Glucose-Capillary: 210 mg/dL — ABNORMAL HIGH (ref 70–99)

## 2014-01-19 LAB — LIPASE, BLOOD: Lipase: 38 U/L (ref 11–59)

## 2014-01-19 MED ORDER — ONDANSETRON HCL 4 MG PO TABS: 4.0000 mg | ORAL_TABLET | Freq: Four times a day (QID) | ORAL | Status: DC | PRN

## 2014-01-19 MED ORDER — METFORMIN HCL 500 MG PO TABS: 500.0000 mg | ORAL_TABLET | Freq: Two times a day (BID) | ORAL | Status: DC

## 2014-01-19 MED ORDER — FOLIC ACID 1 MG PO TABS
1.0000 mg | ORAL_TABLET | Freq: Every day | ORAL | Status: DC
  Administered 2014-01-19 – 2014-01-21 (×3): 1 mg via ORAL
  Filled 2014-01-19 (×3): qty 1

## 2014-01-19 MED ORDER — ADULT MULTIVITAMIN W/MINERALS CH
1.0000 | ORAL_TABLET | Freq: Every day | ORAL | Status: DC
  Administered 2014-01-19 – 2014-01-21 (×3): 1 via ORAL
  Filled 2014-01-19 (×3): qty 1

## 2014-01-19 MED ORDER — HYDROCODONE-ACETAMINOPHEN 5-325 MG PO TABS
1.0000 | ORAL_TABLET | ORAL | Status: DC | PRN
  Administered 2014-01-19: 2 via ORAL
  Administered 2014-01-19 – 2014-01-20 (×2): 1 via ORAL
  Administered 2014-01-20: 2 via ORAL
  Administered 2014-01-20: 1 via ORAL
  Filled 2014-01-19 (×2): qty 1
  Filled 2014-01-19 (×2): qty 2
  Filled 2014-01-19: qty 1

## 2014-01-19 MED ORDER — PNEUMOCOCCAL VAC POLYVALENT 25 MCG/0.5ML IJ INJ
0.5000 mL | INJECTION | INTRAMUSCULAR | Status: DC
  Filled 2014-01-19: qty 0.5

## 2014-01-19 MED ORDER — DOCUSATE SODIUM 100 MG PO CAPS
100.0000 mg | ORAL_CAPSULE | Freq: Two times a day (BID) | ORAL | Status: DC
  Administered 2014-01-19 – 2014-01-21 (×4): 100 mg via ORAL
  Filled 2014-01-19 (×4): qty 1

## 2014-01-19 MED ORDER — ONDANSETRON HCL 4 MG/2ML IJ SOLN: 4.0000 mg | Freq: Four times a day (QID) | INTRAMUSCULAR | Status: DC | PRN

## 2014-01-19 MED ORDER — VITAMIN B-1 100 MG PO TABS
100.0000 mg | ORAL_TABLET | Freq: Every day | ORAL | Status: DC
  Administered 2014-01-19 – 2014-01-21 (×3): 100 mg via ORAL
  Filled 2014-01-19 (×3): qty 1

## 2014-01-19 MED ORDER — SORBITOL 70 % SOLN: 30.0000 mL | Freq: Every day | Status: DC | PRN

## 2014-01-19 MED ORDER — HEPARIN SODIUM (PORCINE) 5000 UNIT/ML IJ SOLN
5000.0000 [IU] | Freq: Three times a day (TID) | INTRAMUSCULAR | Status: DC
  Administered 2014-01-19 – 2014-01-21 (×5): 5000 [IU] via SUBCUTANEOUS
  Filled 2014-01-19 (×5): qty 1

## 2014-01-19 MED ORDER — MORPHINE SULFATE 4 MG/ML IJ SOLN
4.0000 mg | Freq: Once | INTRAMUSCULAR | Status: AC
  Administered 2014-01-19: 4 mg via INTRAVENOUS
  Filled 2014-01-19: qty 1

## 2014-01-19 MED ORDER — ONDANSETRON HCL 4 MG/2ML IJ SOLN
4.0000 mg | Freq: Once | INTRAMUSCULAR | Status: DC
  Filled 2014-01-19: qty 2

## 2014-01-19 MED ORDER — INSULIN ASPART 100 UNIT/ML ~~LOC~~ SOLN
0.0000 [IU] | Freq: Three times a day (TID) | SUBCUTANEOUS | Status: DC
  Administered 2014-01-20 – 2014-01-21 (×4): 3 [IU] via SUBCUTANEOUS

## 2014-01-19 MED ORDER — SODIUM CHLORIDE 0.9 % IV BOLUS (SEPSIS)
1000.0000 mL | Freq: Once | INTRAVENOUS | Status: AC
  Administered 2014-01-19: 1000 mL via INTRAVENOUS

## 2014-01-19 MED ORDER — NICOTINE 21 MG/24HR TD PT24
21.0000 mg | MEDICATED_PATCH | Freq: Every day | TRANSDERMAL | Status: DC
Start: 2014-01-19 — End: 2014-01-21
  Administered 2014-01-19 – 2014-01-21 (×3): 21 mg via TRANSDERMAL
  Filled 2014-01-19 (×3): qty 1

## 2014-01-19 MED ORDER — SIMVASTATIN 20 MG PO TABS
20.0000 mg | ORAL_TABLET | Freq: Every day | ORAL | Status: DC
  Administered 2014-01-19 – 2014-01-20 (×2): 20 mg via ORAL
  Filled 2014-01-19 (×2): qty 1

## 2014-01-19 MED ORDER — SODIUM CHLORIDE 0.9 % IV SOLN
INTRAVENOUS | Status: DC
  Administered 2014-01-19 – 2014-01-20 (×2): via INTRAVENOUS

## 2014-01-19 MED ORDER — CYCLOBENZAPRINE HCL 10 MG PO TABS: 5.0000 mg | ORAL_TABLET | Freq: Three times a day (TID) | ORAL | Status: DC | PRN

## 2014-01-19 MED ORDER — MORPHINE SULFATE 2 MG/ML IJ SOLN: 1.0000 mg | INTRAMUSCULAR | Status: DC | PRN

## 2014-01-19 MED ORDER — IOHEXOL 300 MG/ML  SOLN
100.0000 mL | Freq: Once | INTRAMUSCULAR | Status: AC | PRN
  Administered 2014-01-19: 100 mL via INTRAVENOUS

## 2014-01-19 MED ORDER — ALPRAZOLAM 1 MG PO TABS
1.0000 mg | ORAL_TABLET | Freq: Three times a day (TID) | ORAL | Status: DC | PRN
  Administered 2014-01-19 – 2014-01-20 (×2): 1 mg via ORAL
  Filled 2014-01-19 (×3): qty 1

## 2014-01-19 MED ORDER — ONDANSETRON HCL 4 MG/2ML IJ SOLN
4.0000 mg | Freq: Once | INTRAMUSCULAR | Status: AC
  Administered 2014-01-19: 4 mg via INTRAVENOUS

## 2014-01-19 MED ORDER — POLYETHYLENE GLYCOL 3350 17 G PO PACK: 17.0000 g | PACK | Freq: Every day | ORAL | Status: DC | PRN

## 2014-01-19 NOTE — ED Notes (Signed)
Pt waiting for bmet results prior to being transported to ct

## 2014-01-19 NOTE — ED Notes (Signed)
Pt c/o upper quad abd pain that radiates to chest area with nausea that started last week, was seen er a few days for same, states that he is not any better, Dr Lacinda Axon in prior to RN, see edp assessment for further.

## 2014-01-19 NOTE — H&P (Signed)
[facility] History and Physical  PATRICE MOATES AGT:[id number] DOB: 03-Feb-1966 DOA: 01/19/2014  Referring physician: Nat Christen, MD PCP: [name], MD   Chief Complaint: Abdominal Pain  HPI: [name] is a 48 y.o. male who was seen here on July 9th with pain presents back with pain in the epigastric area. Patient states that the pain is severe appears to radiate into the back. Patient has had no fevers or chills. He has no vomiting. Denies having any diarrhea. He states there is also some pain in his chest but seems to be starting from the abdomen and going up. Patient states he has been able to eat well and has not been drinking. He does smoke however.   Review of Systems:  Constitutional:  No weight loss Fevers, chills, fatigue.  HEENT:  No headaches Cardio-vascular:  No chest pain, Orthopnea, PND, swelling in lower extremities, anasarca, dizziness  GI:  No heartburn, indigestion, ++abdominal pain, ++nausea, No vomiting, diarrhea  Resp:  No shortness of breath with exertion no productive cough, No non-productive cough, No coughing up of blood.  Skin:  no rash or lesions.  GU:  no dysuria, change in color of urine.  Musculoskeletal:  No joint pain or swelling. No decreased range of motion.  Psych:  No change in mood or affect.  Past Medical History  Diagnosis Date  . Hypercholesteremia   . Anxiety   . Bulging disc   . Borderline diabetes   . Pancreatitis   . Diabetes mellitus without complication   . Ischemia, bowel    Past Surgical History  Procedure Laterality Date  . Appendectomy    . Abdominal surgery    . Back surgery     Social History:  reports that he has been smoking Cigarettes.  He has a 15 pack-year smoking history. He has never used smokeless tobacco. He reports that he does not drink alcohol or use illicit drugs.  No Known Allergies  Family History  Problem Relation Age of Onset  . Diabetes Father      Prior to Admission  medications   Medication Sig Start Date End Date Taking? Authorizing Provider  ALPRAZolam Duanne Moron) 1 MG tablet Take 1 mg by mouth 3 (three) times daily as needed for anxiety.    Yes Historical Provider, MD  cyclobenzaprine (FLEXERIL) 5 MG tablet Take 5 mg by mouth 3 (three) times daily as needed for muscle spasms.   Yes Historical Provider, MD  metFORMIN (GLUCOPHAGE) 500 MG tablet Take 500 mg by mouth 2 (two) times daily with a meal.   Yes Historical Provider, MD  oxyCODONE-acetaminophen (PERCOCET/ROXICET) 5-325 MG per tablet Take 1 tablet by mouth every 4 (four) hours as needed for moderate pain or severe pain.   Yes Historical Provider, MD  pravastatin (PRAVACHOL) 40 MG tablet Take 40 mg by mouth daily.   Yes Historical Provider, MD  promethazine (PHENERGAN) 25 MG tablet Take 25 mg by mouth every 6 (six) hours as needed for nausea. 01/16/14 01/19/14  Nat Christen, MD   Physical Exam: Filed Vitals:   01/19/14 1704  BP: 115/75  Pulse: 63  Temp: 97.5 F (36.4 C)  Resp: 18    BP 115/75  Pulse 63  Temp(Src) 97.5 F (36.4 C) (Oral)  Resp 18  Ht 6\' 1"  (1.854 m)  Wt 105.235 kg (232 lb)  BMI 30.62 kg/m2  SpO2 99%  General:  Appears calm and comfortable Eyes: PERRL, normal lids, irises & conjunctiva ENT: grossly normal hearing,  lips & tongue Neck: no LAD, masses or thyromegaly Cardiovascular: RRR, no m/r/g. No LE edema Respiratory: CTA bilaterally, no w/r/r. Normal respiratory effort. Abdomen: soft,slight epigastric tenderness Skin: no rash or induration seen on limited exam Musculoskeletal: grossly normal tone BUE/BLE Psychiatric: grossly normal mood and affect, speech fluent and appropriate Neurologic: grossly non-focal.          Labs on Admission:  Basic Metabolic Panel:  Recent Labs Lab 01/16/14 1207 01/19/14 1344  NA 137 137  K 3.9 4.1  CL 101 98  CO2 24 27  GLUCOSE 235* 174*  BUN 9 13  CREATININE 0.70 0.77  CALCIUM 9.0 9.5   Liver Function Tests:  Recent Labs Lab  01/16/14 1207 01/19/14 1344  AST 15 17  ALT 19 17  ALKPHOS 90 85  BILITOT 0.3 0.3  PROT 7.5 7.6  ALBUMIN 3.8 3.5    Recent Labs Lab 01/16/14 1207 01/19/14 1344  LIPASE 62* 38   No results found for this basename: AMMONIA,  in the last 168 hours CBC:  Recent Labs Lab 01/16/14 1207 01/19/14 1344  WBC 12.8* 11.3*  NEUTROABS 9.7* 7.6  HGB 14.1 15.4  HCT 40.8 44.3  MCV 89.5 90.2  PLT 134* 154   Cardiac Enzymes: No results found for this basename: CKTOTAL, CKMB, CKMBINDEX, TROPONINI,  in the last 168 hours  BNP (last 3 results) No results found for this basename: PROBNP,  in the last 8760 hours CBG: No results found for this basename: GLUCAP,  in the last 168 hours  Radiological Exams on Admission: Ct Abdomen Pelvis W Contrast  01/19/2014   CLINICAL DATA:  Upper quadrant abdominal pain  EXAM: CT ABDOMEN AND PELVIS WITH CONTRAST  TECHNIQUE: Multidetector CT imaging of the abdomen and pelvis was performed using the standard protocol following bolus administration of intravenous contrast.  CONTRAST:  172mL OMNIPAQUE IOHEXOL 300 MG/ML  SOLN  COMPARISON:  CT 09/15/2013  FINDINGS: Lung bases are clear.  No pericardial fluid.  No focal hepatic lesion. Gallbladder, pancreas, spleen, adrenal glands, and kidneys are normal.  The stomach, small bowel, and cecum normal. The colon and rectosigmoid colon are normal. There several diverticula through the sigmoid region without acute inflammation.  Abdominal or is normal caliber. No retroperitoneal periportal lymphadenopathy.  No mesenteric adenopathy. There is mild stranding in the mesentery adjacent to the tail of pancreas. A subtle low-density lesion measuring 11 mm in the same location (image 35, series 2). Very mild thickening of the anterior perirenal fascia on the left (image 39, series 2). Findings are similar to CT of 09/25/2013.  No free fluid the pelvis. Prostate gland and bladder normal. No pelvic lymphadenopathy.  IMPRESSION: 1.  Subtle inflammation surrounding the tail of the pancreas is concerning for acute focal pancreatitis. This is same location as focal pancreatitis on 09/15/2013. Recommend biochemical correlation. 2. Very small low-density lesion within the tail of the pancreas. This may relate to inflammation. Recommend abdominal MRI with without contrast after acute symptomology has resolved.   Electronically Signed   By: Suzy Bouchard M.D.   On: 01/19/2014 15:25      Assessment/Plan Active Problems:   Pancreatitis   Hyperlipidemia   Diabetes mellitus   1. Pancreatitis -seen on CT scan -will be admitted for pain control -will start IV fluids -consider GI evaluation -lipase which was elevated on 7/9 is actually coming down  2. Hyperlipidemia -will monitor labs  -start on home medications  3. Diabetes Mellitus -check A1C -continue with home medications -monitor  CBG    Code Status: Full Code (must indicate code status--if unknown or must be presumed, indicate so) Family Communication: None (indicate person spoken with, if applicable, with phone number if by telephone) Disposition Plan: Home (indicate anticipated LOS)  Time spent: 71min  KHAN,SAADAT A [facility] Pager [phone]  **Disclaimer: This note may have been dictated with voice recognition software. Similar sounding words can inadvertently be transcribed and this note may contain transcription errors which may not have been corrected upon publication of note.**

## 2014-01-19 NOTE — ED Provider Notes (Signed)
CSN: [id number]     Arrival date & time 01/19/14  1125 History   First MD Initiated Contact with Patient 01/19/14 1246     Chief Complaint  Patient presents with  . Chest Pain  . Abdominal Pain     (Consider location/radiation/quality/duration/timing/severity/associated sxs/prior Treatment) HPI... patient seen on July 9 with left-sided abdominal pain and discharged home. He now presents with persistent pain in the same area. He has a history of pancreatitis but is not heavy drinker.  Severity is moderate. Nothing makes symptoms better or worse. No fever, chills, vomiting, diarrhea, chest pain, dyspnea.  Past Medical History  Diagnosis Date  . Hypercholesteremia   . Anxiety   . Bulging disc   . Borderline diabetes   . Pancreatitis   . Diabetes mellitus without complication   . Ischemia, bowel    Past Surgical History  Procedure Laterality Date  . Appendectomy    . Abdominal surgery    . Back surgery     Family History  Problem Relation Age of Onset  . Diabetes Father    History  Substance Use Topics  . Smoking status: Current Every Day Smoker -- 0.50 packs/day for 30 years    Types: Cigarettes  . Smokeless tobacco: Never Used  . Alcohol Use: No    Review of Systems  All other systems reviewed and are negative.     Allergies  Review of patient's allergies indicates no known allergies.  Home Medications   Prior to Admission medications   Medication Sig Start Date End Date Taking? Authorizing Provider  ALPRAZolam Duanne Moron) 1 MG tablet Take 1 mg by mouth 3 (three) times daily as needed for anxiety.    Yes Historical Provider, MD  cyclobenzaprine (FLEXERIL) 5 MG tablet Take 5 mg by mouth 3 (three) times daily as needed for muscle spasms.   Yes Historical Provider, MD  metFORMIN (GLUCOPHAGE) 500 MG tablet Take 500 mg by mouth 2 (two) times daily with a meal.   Yes Historical Provider, MD  oxyCODONE-acetaminophen (PERCOCET/ROXICET) 5-325 MG per tablet Take 1 tablet by  mouth every 4 (four) hours as needed for moderate pain or severe pain.   Yes Historical Provider, MD  pravastatin (PRAVACHOL) 40 MG tablet Take 40 mg by mouth daily.   Yes Historical Provider, MD  promethazine (PHENERGAN) 25 MG tablet Take 25 mg by mouth every 6 (six) hours as needed for nausea. 01/16/14 01/19/14  Nat Christen, MD   BP 134/91  Pulse 53  Temp(Src) 98.1 F (36.7 C) (Oral)  Resp 15  Ht 6\' 1"  (1.854 m)  Wt 232 lb (105.235 kg)  BMI 30.62 kg/m2  SpO2 100% Physical Exam  Nursing note and vitals reviewed. Constitutional: He is oriented to person, place, and time. He appears well-developed and well-nourished.  HENT:  Head: Normocephalic and atraumatic.  Eyes: Conjunctivae and EOM are normal. Pupils are equal, round, and reactive to light.  Neck: Normal range of motion. Neck supple.  Cardiovascular: Normal rate, regular rhythm and normal heart sounds.   Pulmonary/Chest: Effort normal and breath sounds normal.  Abdominal: Soft. Bowel sounds are normal.  Tender left abdomen especially left upper quadrant  Musculoskeletal: Normal range of motion.  Neurological: He is alert and oriented to person, place, and time.  Skin: Skin is warm and dry.  Psychiatric: He has a normal mood and affect. His behavior is normal.    ED Course  Procedures (including critical care time) Labs Review Labs Reviewed  COMPREHENSIVE METABOLIC PANEL - Abnormal;  Notable for the following:    Glucose, Bld 174 (*)    All other components within normal limits  CBC WITH DIFFERENTIAL - Abnormal; Notable for the following:    WBC 11.3 (*)    All other components within normal limits  LIPASE, BLOOD    Imaging Review Ct Abdomen Pelvis W Contrast  01/19/2014   CLINICAL DATA:  Upper quadrant abdominal pain  EXAM: CT ABDOMEN AND PELVIS WITH CONTRAST  TECHNIQUE: Multidetector CT imaging of the abdomen and pelvis was performed using the standard protocol following bolus administration of intravenous contrast.   CONTRAST:  156mL OMNIPAQUE IOHEXOL 300 MG/ML  SOLN  COMPARISON:  CT 09/15/2013  FINDINGS: Lung bases are clear.  No pericardial fluid.  No focal hepatic lesion. Gallbladder, pancreas, spleen, adrenal glands, and kidneys are normal.  The stomach, small bowel, and cecum normal. The colon and rectosigmoid colon are normal. There several diverticula through the sigmoid region without acute inflammation.  Abdominal or is normal caliber. No retroperitoneal periportal lymphadenopathy.  No mesenteric adenopathy. There is mild stranding in the mesentery adjacent to the tail of pancreas. A subtle low-density lesion measuring 11 mm in the same location (image 35, series 2). Very mild thickening of the anterior perirenal fascia on the left (image 39, series 2). Findings are similar to CT of 09/25/2013.  No free fluid the pelvis. Prostate gland and bladder normal. No pelvic lymphadenopathy.  IMPRESSION: 1. Subtle inflammation surrounding the tail of the pancreas is concerning for acute focal pancreatitis. This is same location as focal pancreatitis on 09/15/2013. Recommend biochemical correlation. 2. Very small low-density lesion within the tail of the pancreas. This may relate to inflammation. Recommend abdominal MRI with without contrast after acute symptomology has resolved.   Electronically Signed   By: Suzy Bouchard M.D.   On: 01/19/2014 15:25     EKG Interpretation   Date/Time:  Sunday January 19 2014 12:18:30 EDT Ventricular Rate:  58 PR Interval:  185 QRS Duration: 92 QT Interval:  417 QTC Calculation: 409 R Axis:   42 Text Interpretation:  Sinus rhythm Baseline wander in lead(s) V5 Confirmed  by Judie Hollick  MD, Antoni Stefan ([id number]) on 01/19/2014 1:27:37 PM      MDM   Final diagnoses:  Acute pancreatitis, unspecified pancreatitis type    CT scan reveals subtle inflammation surrounding the tail of the pancreas.  Second visit to the emergency department for similar symptoms. Admit     Nat Christen, MD 01/19/14  [phone]

## 2014-01-19 NOTE — ED Notes (Signed)
Patient c/o left chest pain and left abd. Denies nausea, vomiting, diarrhea, or shortness of breath. Patient reports being seen here on Thursday, given percocet and zofran-per patient no relief with medication .

## 2014-01-19 NOTE — ED Notes (Signed)
Pt states that his pain is starting to return, Dr Lacinda Axon notified, additional orders given

## 2014-01-19 NOTE — Progress Notes (Signed)
Patient had ordered metformin for 1800. Patient had received a CT scan in the ER prior to being admitted to the floor. Verified with radiology that patient did have both IV and oral contrast for CT scan. Radiology stated patient should not receive metformin for 48 hours. MD was notified. MD ordered to hold metformin for 48 hours and start mild sliding scale and ACHS blood glucose checks. Will continue to monitor patient at this time.

## 2014-01-20 DIAGNOSIS — E785 Hyperlipidemia, unspecified: Secondary | ICD-10-CM

## 2014-01-20 DIAGNOSIS — E119 Type 2 diabetes mellitus without complications: Secondary | ICD-10-CM

## 2014-01-20 LAB — CBC
HEMATOCRIT: 44.6 % (ref 39.0–52.0)
Hemoglobin: 14.9 g/dL (ref 13.0–17.0)
MCH: 30.5 pg (ref 26.0–34.0)
MCHC: 33.4 g/dL (ref 30.0–36.0)
MCV: 91.2 fL (ref 78.0–100.0)
Platelets: 153 10*3/uL (ref 150–400)
RBC: 4.89 MIL/uL (ref 4.22–5.81)
RDW: 14 % (ref 11.5–15.5)
WBC: 9.8 10*3/uL (ref 4.0–10.5)

## 2014-01-20 LAB — COMPREHENSIVE METABOLIC PANEL
ALT: 18 U/L (ref 0–53)
ANION GAP: 14 (ref 5–15)
AST: 21 U/L (ref 0–37)
Albumin: 3.3 g/dL — ABNORMAL LOW (ref 3.5–5.2)
Alkaline Phosphatase: 87 U/L (ref 39–117)
BUN: 9 mg/dL (ref 6–23)
CALCIUM: 8.9 mg/dL (ref 8.4–10.5)
CO2: 23 mEq/L (ref 19–32)
Chloride: 99 mEq/L (ref 96–112)
Creatinine, Ser: 0.65 mg/dL (ref 0.50–1.35)
GFR calc Af Amer: >90 mL/min (ref >90)
GFR calc non Af Amer: >90 mL/min (ref >90)
Glucose, Bld: 197 mg/dL — ABNORMAL HIGH (ref 70–99)
Potassium: 3.8 mEq/L (ref 3.7–5.3)
Sodium: 136 mEq/L — ABNORMAL LOW (ref 137–147)
TOTAL PROTEIN: 7 g/dL (ref 6.0–8.3)
Total Bilirubin: 0.3 mg/dL (ref 0.3–1.2)

## 2014-01-20 LAB — LIPID PANEL
Cholesterol: 345 mg/dL — ABNORMAL HIGH (ref 0–200)
HDL: 20 mg/dL — ABNORMAL LOW (ref >39)
LDL Cholesterol: UNDETERMINED mg/dL (ref 0–99)
Total CHOL/HDL Ratio: 17.3 RATIO
Triglycerides: 1132 mg/dL — ABNORMAL HIGH (ref <150)
VLDL: UNDETERMINED mg/dL (ref 0–40)

## 2014-01-20 LAB — GLUCOSE, CAPILLARY
GLUCOSE-CAPILLARY: 175 mg/dL — AB (ref 70–99)
GLUCOSE-CAPILLARY: 183 mg/dL — AB (ref 70–99)
GLUCOSE-CAPILLARY: 195 mg/dL — AB (ref 70–99)
Glucose-Capillary: 154 mg/dL — ABNORMAL HIGH (ref 70–99)

## 2014-01-20 LAB — HEMOGLOBIN A1C
HEMOGLOBIN A1C: 7.8 % — AB (ref <5.7)
Mean Plasma Glucose: 177 mg/dL — ABNORMAL HIGH (ref <117)

## 2014-01-20 LAB — TSH: TSH: 1.1 u[IU]/mL (ref 0.350–4.500)

## 2014-01-20 LAB — TROPONIN I: Troponin I: <0.3 ng/mL (ref <0.30)

## 2014-01-20 MED ORDER — GEMFIBROZIL 600 MG PO TABS: 600.0000 mg | ORAL_TABLET | Freq: Two times a day (BID) | ORAL | Status: DC

## 2014-01-20 MED ORDER — SODIUM CHLORIDE 0.9 % IV SOLN: Freq: Once | INTRAVENOUS | Status: DC

## 2014-01-20 MED ORDER — GEMFIBROZIL 600 MG PO TABS
600.0000 mg | ORAL_TABLET | Freq: Two times a day (BID) | ORAL | Status: DC
  Administered 2014-01-20 – 2014-01-21 (×2): 600 mg via ORAL
  Filled 2014-01-20 (×2): qty 1

## 2014-01-20 NOTE — Progress Notes (Signed)
Patient sitting up in bed. Alert and oriented. No complaints voiced at this time.

## 2014-01-20 NOTE — Progress Notes (Signed)
UR chart review completed.  

## 2014-01-20 NOTE — Progress Notes (Addendum)
PROGRESS NOTE  [name] UKG:[id number] DOB: [birth year] DOA: 01/19/2014 PCP: [name], MD  Summary: 48 year old man with history of pancreatitis, seen twice in the emergency department recently for epigastric and upper abdominal pain. Presented again on 7/12 with similar complaints as well as radiation to his chest. CT scan suggested mild pancreatitis.  Assessment/Plan: 1. Mild acute pancreatitis by imaging. Lipase actually normal, symptoms improved. 2. Severe hypertriglyceridemia, hyperlipidemia. Patient reports she has been on Pravachol as an outpatient. Recommend gemfibrozil and outpatient follow up with endocrinology for management of lipids, triglycerides and diabetes, appointment arranged. 3. Chest pain atypical. Resolved. Referred from epigastrium. No further evaluation suggested. Troponin negative. 4. Diabetes mellitus type 2. Capillary blood sugars stable. 5. Tobacco dependence. Recommend cessation. 6. "Very small low-density lesion within the tail of the pancreas. This may relate to inflammation. Recommend abdominal MRI with without contrast after acute symptomology has resolved" -- per radiologist   Overall much improved. Advance diet. Maybe over the home later today versus in the morning.  Check lipid panel.  Code Status: full code DVT prophylaxis: heparin Family Communication: none present Disposition Plan: home  Murray Hodgkins, MD  [facility]  Pager [phone] If 7PM-7AM, please contact night-coverage at [email or web address], password [facility] 01/20/2014, 10:49 AM  LOS: 1 day   Consultants:    Procedures:    Antibiotics:    HPI/Subjective: Feeling better. Minimal epigastric abdominal pain. No nausea, vomiting. Tolerating full liquids. Would like to advance diet. He denies alcohol use.  Objective: Filed Vitals:   01/19/14 1600 01/19/14 1704 01/19/14 2025 01/20/14 0449  BP: 124/79 115/75 128/80 116/75  Pulse: 74 63 67 65  Temp: 98.4 F (36.9 C)  97.5 F (36.4 C) 98 F (36.7 C) 99.1 F (37.3 C)  TempSrc: Oral Oral Oral Oral  Resp: 18 18 18 18   Height:  6\' 1"  (1.854 m)    Weight:      SpO2: 100% 99% 97% 99%    Intake/Output Summary (Last 24 hours) at 01/20/14 1049 Last data filed at 01/20/14 0916  Gross per 24 hour  Intake    720 ml  Output   1200 ml  Net   -480 ml     Filed Weights   01/19/14 1210  Weight: 105.235 kg (232 lb)    Exam:     Afebrile, vital signs stable. No hypoxia. Gen. Appears calm and comfortable.  Psych. Alert. Speech fluent and clear. Mentation grossly normal.  Cardiovascular. Regular rate and rhythm. No murmur, rub or gallop. No lower extremity edema.  Respiratory. Clear to auscultation bilaterally. No wheezes, rales or rhonchi. Normal respiratory effort.  Abdomen. Soft, nontender, nondistended.  Data Reviewed:  Chemistry: Complete metabolic panel unremarkable. Lipase on admission well within normal limits.  Heme: CBC unremarkable.  Other: EKG: Sinus rhythm. No acute changes.  Imaging: CT of the abdomen and pelvis with subtle inflammation surrounding the tail of the pancreas concerning for focal acute pancreatitis.  Scheduled Meds: . docusate sodium  100 mg Oral BID  . folic acid  1 mg Oral Daily  . heparin  5,000 Units Subcutaneous 3 times per day  . insulin aspart  0-15 Units Subcutaneous TID WC  . multivitamin with minerals  1 tablet Oral Daily  . nicotine  21 mg Transdermal Daily  . pneumococcal 23 valent vaccine  0.5 mL Intramuscular Tomorrow-1000  . simvastatin  20 mg Oral q1800  . thiamine  100 mg Oral Daily   Continuous Infusions: . sodium chloride 75 mL/hr at  01/20/14 3276    Active Problems:   Pancreatitis   Hyperlipidemia   Diabetes mellitus   Time spent 20 minutes

## 2014-01-21 LAB — GLUCOSE, CAPILLARY: Glucose-Capillary: 162 mg/dL — ABNORMAL HIGH (ref 70–99)

## 2014-01-21 LAB — COMPREHENSIVE METABOLIC PANEL
ALBUMIN: 3.2 g/dL — AB (ref 3.5–5.2)
ALK PHOS: 86 U/L (ref 39–117)
ALT: 16 U/L (ref 0–53)
ANION GAP: 12 (ref 5–15)
AST: 12 U/L (ref 0–37)
BUN: 8 mg/dL (ref 6–23)
CO2: 28 mEq/L (ref 19–32)
Calcium: 9 mg/dL (ref 8.4–10.5)
Chloride: 101 mEq/L (ref 96–112)
Creatinine, Ser: 0.8 mg/dL (ref 0.50–1.35)
GFR calc Af Amer: >90 mL/min (ref >90)
GFR calc non Af Amer: >90 mL/min (ref >90)
Glucose, Bld: 165 mg/dL — ABNORMAL HIGH (ref 70–99)
POTASSIUM: 4.1 meq/L (ref 3.7–5.3)
Sodium: 141 mEq/L (ref 137–147)
TOTAL PROTEIN: 6.7 g/dL (ref 6.0–8.3)
Total Bilirubin: <0.2 mg/dL — ABNORMAL LOW (ref 0.3–1.2)

## 2014-01-21 LAB — LIPASE, BLOOD: Lipase: 32 U/L (ref 11–59)

## 2014-01-21 MED ORDER — GEMFIBROZIL 600 MG PO TABS: 600.0000 mg | ORAL_TABLET | Freq: Two times a day (BID) | ORAL | Status: DC

## 2014-01-21 MED ORDER — OXYCODONE-ACETAMINOPHEN 5-325 MG PO TABS: 1.0000 | ORAL_TABLET | ORAL | Status: DC | PRN

## 2014-01-21 MED ORDER — NICOTINE 21 MG/24HR TD PT24: 21.0000 mg | MEDICATED_PATCH | Freq: Every day | TRANSDERMAL | Status: DC

## 2014-01-21 NOTE — Discharge Summary (Signed)
Physician Discharge Summary  CALIXTO [name]:[id number] DOB: January 06, 1966 DOA: 01/19/2014  PCP: [name], MD  Admit date: 01/19/2014 Discharge date: 01/21/2014  Time spent: 35 minutes  Recommendations for Outpatient Follow-up:  1. Follow up with PCP in 1-2 weeks   Recommendations for primary care physician for things to follow:  Outpatient follow up MRI as below  Discharge Diagnoses:  Active Problems:   Pancreatitis   Hyperlipidemia   Diabetes mellitus  Discharge Condition: stable  Diet recommendation: low fat  Filed Weights   01/19/14 1210  Weight: 105.235 kg (232 lb)    History of present illness:  [name] is a 48 y.o. male who was seen here on July 9th with pain presents back with pain in the epigastric area. Patient states that the pain is severe appears to radiate into the back. Patient has had no fevers or chills. He has no vomiting. Denies having any diarrhea. He states there is also some pain in his chest but seems to be starting from the abdomen and going up. Patient states he has been able to eat well and has not been drinking. He does smoke however.  Hospital Course:  Mild acute pancreatitis by imaging. Lipase actually normal, symptoms improved.  Severe hypertriglyceridemia, hyperlipidemia. Patient reports she has been on Pravachol as an outpatient. Recommend gemfibrozil and outpatient follow up with endocrinology for management of lipids, triglycerides and diabetes, appointment arranged. Prescription for gemfibrozil given to patient.  Chest pain atypical. Resolved. Referred from epigastrium. No further evaluation suggested. Troponin negative.  Diabetes mellitus type 2. Capillary blood sugars stable.  Tobacco dependence. Recommend cessation. Nicotine patch prescription provided to patient.     "Very small low-density lesion within the tail of the pancreas. This may relate to inflammation. Recommend abdominal MRI with without contrast after acute  symptomology has resolved" -- per radiologist  Procedures:  None    Consultations:  None   Discharge Exam: Filed Vitals:   01/20/14 1441 01/20/14 2220 01/21/14 0641 01/21/14 0728  BP: 109/66 149/89 105/69   Pulse: 58 64 51   Temp: 97.7 F (36.5 C) 97.4 F (36.3 C) 98.6 F (37 C)   TempSrc:  Oral Oral   Resp: 18 18 18    Height:      Weight:      SpO2: 97% 99% 97% 98%    General: NAD Cardiovascular: RRR Respiratory: CTA biL  Discharge Instructions     Medication List    STOP taking these medications       promethazine 25 MG tablet  Commonly known as:  PHENERGAN      TAKE these medications       ALPRAZolam 1 MG tablet  Commonly known as:  XANAX  Take 1 mg by mouth 3 (three) times daily as needed for anxiety.     cyclobenzaprine 5 MG tablet  Commonly known as:  FLEXERIL  Take 5 mg by mouth 3 (three) times daily as needed for muscle spasms.     gemfibrozil 600 MG tablet  Commonly known as:  LOPID  Take 1 tablet (600 mg total) by mouth 2 (two) times daily before a meal.     metFORMIN 500 MG tablet  Commonly known as:  GLUCOPHAGE  Take 500 mg by mouth 2 (two) times daily with a meal.     nicotine 21 mg/24hr patch  Commonly known as:  NICODERM CQ - dosed in mg/24 hours  Place 1 patch (21 mg total) onto the skin daily.  oxyCODONE-acetaminophen 5-325 MG per tablet  Commonly known as:  PERCOCET/ROXICET  Take 1 tablet by mouth every 4 (four) hours as needed for moderate pain or severe pain.     pravastatin 40 MG tablet  Commonly known as:  PRAVACHOL  Take 40 mg by mouth daily.           Follow-up Information   Follow up with NIDA,GEBRESELASSIE, MD On 03/19/2014. (for high cholesterol and hypertriglyceridema, at 2 PM)    Specialty:  Endocrinology   Contact information:   [place] [address] [phone]       Follow up with [name], MD In 1 week.   Specialty:  Family Medicine   Contact information:   [address] [place] [address] [phone]       The results of significant diagnostics from this hospitalization (including imaging, microbiology, ancillary and laboratory) are listed below for reference.    Significant Diagnostic Studies: Ct Abdomen Pelvis W Contrast  01/19/2014   CLINICAL DATA:  Upper quadrant abdominal pain  EXAM: CT ABDOMEN AND PELVIS WITH CONTRAST  TECHNIQUE: Multidetector CT imaging of the abdomen and pelvis was performed using the standard protocol following bolus administration of intravenous contrast.  CONTRAST:  162mL OMNIPAQUE IOHEXOL 300 MG/ML  SOLN  COMPARISON:  CT 09/15/2013  FINDINGS: Lung bases are clear.  No pericardial fluid.  No focal hepatic lesion. Gallbladder, pancreas, spleen, adrenal glands, and kidneys are normal.  The stomach, small bowel, and cecum normal. The colon and rectosigmoid colon are normal. There several diverticula through the sigmoid region without acute inflammation.  Abdominal or is normal caliber. No retroperitoneal periportal lymphadenopathy.  No mesenteric adenopathy. There is mild stranding in the mesentery adjacent to the tail of pancreas. A subtle low-density lesion measuring 11 mm in the same location (image 35, series 2). Very mild thickening of the anterior perirenal fascia on the left (image 39, series 2). Findings are similar to CT of 09/25/2013.  No free fluid the pelvis. Prostate gland and bladder normal. No pelvic lymphadenopathy.  IMPRESSION: 1. Subtle inflammation surrounding the tail of the pancreas is concerning for acute focal pancreatitis. This is same location as focal pancreatitis on 09/15/2013. Recommend biochemical correlation. 2. Very small low-density lesion within the tail of the pancreas. This may relate to inflammation. Recommend abdominal MRI with without contrast after acute symptomology has resolved.   Electronically Signed   By: Suzy Bouchard M.D.   On: 01/19/2014 15:25   Labs: Basic Metabolic  Panel:  Recent Labs Lab 01/16/14 1207 01/19/14 1344 01/20/14 0645 01/21/14 0518  NA 137 137 136* 141  K 3.9 4.1 3.8 4.1  CL 101 98 99 101  CO2 24 27 23 28   GLUCOSE 235* 174* 197* 165*  BUN 9 13 9 8   CREATININE 0.70 0.77 0.65 0.80  CALCIUM 9.0 9.5 8.9 9.0   Liver Function Tests:  Recent Labs Lab 01/16/14 1207 01/19/14 1344 01/20/14 0645 01/21/14 0518  AST 15 17 21 12   ALT 19 17 18 16   ALKPHOS 90 85 87 86  BILITOT 0.3 0.3 0.3 <0.2*  PROT 7.5 7.6 7.0 6.7  ALBUMIN 3.8 3.5 3.3* 3.2*    Recent Labs Lab 01/16/14 1207 01/19/14 1344 01/21/14 0518  LIPASE 62* 38 32   CBC:  Recent Labs Lab 01/16/14 1207 01/19/14 1344 01/20/14 0645  WBC 12.8* 11.3* 9.8  NEUTROABS 9.7* 7.6  --   HGB 14.1 15.4 14.9  HCT 40.8 44.3 44.6  MCV  89.5 90.2 91.2  PLT 134* 154 153   Cardiac Enzymes:  Recent Labs Lab 01/20/14 1157  TROPONINI <0.30   CBG:  Recent Labs Lab 01/20/14 0750 01/20/14 1203 01/20/14 1643 01/20/14 2112 01/21/14 0816  GLUCAP 154* 175* 183* 195* 162*   Signed:  GHERGHE, COSTIN  [facility] 01/21/2014, 2:33 PM

## 2014-01-21 NOTE — Discharge Instructions (Signed)

## 2014-02-12 ENCOUNTER — Other Ambulatory Visit (HOSPITAL_COMMUNITY): Payer: Self-pay | Admitting: Family Medicine

## 2014-02-12 DIAGNOSIS — R935 Abnormal findings on diagnostic imaging of other abdominal regions, including retroperitoneum: Secondary | ICD-10-CM

## 2014-02-12 DIAGNOSIS — K8689 Other specified diseases of pancreas: Secondary | ICD-10-CM

## 2014-02-14 ENCOUNTER — Other Ambulatory Visit (HOSPITAL_COMMUNITY): Payer: Self-pay | Admitting: Family Medicine

## 2014-02-14 ENCOUNTER — Ambulatory Visit (HOSPITAL_COMMUNITY)
Admission: RE | Admit: 2014-02-14 | Discharge: 2014-02-14 | Disposition: A | Discharge status: Home / Self Care | Payer: PRIVATE HEALTH INSURANCE | Source: Ambulatory Visit | Attending: Family Medicine | Admitting: Family Medicine

## 2014-02-14 DIAGNOSIS — R935 Abnormal findings on diagnostic imaging of other abdominal regions, including retroperitoneum: Secondary | ICD-10-CM | POA: Insufficient documentation

## 2014-02-14 DIAGNOSIS — K8689 Other specified diseases of pancreas: Secondary | ICD-10-CM | POA: Insufficient documentation

## 2014-02-14 DIAGNOSIS — K7689 Other specified diseases of liver: Secondary | ICD-10-CM | POA: Insufficient documentation

## 2014-02-14 MED ORDER — GADOBENATE DIMEGLUMINE 529 MG/ML IV SOLN
20.0000 mL | Freq: Once | INTRAVENOUS | Status: AC | PRN
  Administered 2014-02-14: 20 mL via INTRAVENOUS

## 2014-03-13 ENCOUNTER — Encounter (HOSPITAL_COMMUNITY): Payer: Self-pay | Admitting: Emergency Medicine

## 2014-03-13 ENCOUNTER — Emergency Department (HOSPITAL_COMMUNITY): Payer: PRIVATE HEALTH INSURANCE

## 2014-03-13 ENCOUNTER — Encounter (HOSPITAL_COMMUNITY)
Admission: EM | Disposition: A | Discharge status: Home / Self Care | Payer: Self-pay | Source: Home / Self Care | Attending: Cardiology

## 2014-03-13 ENCOUNTER — Inpatient Hospital Stay (HOSPITAL_COMMUNITY)
Admission: EM | Admit: 2014-03-13 | Discharge: 2014-03-15 | DRG: 249 | Disposition: A | Discharge status: Home / Self Care | Payer: PRIVATE HEALTH INSURANCE | Attending: Cardiology | Admitting: Cardiology

## 2014-03-13 ENCOUNTER — Ambulatory Visit (HOSPITAL_COMMUNITY): Admit: 2014-03-13 | Payer: Self-pay | Admitting: Cardiology

## 2014-03-13 DIAGNOSIS — F411 Generalized anxiety disorder: Secondary | ICD-10-CM | POA: Diagnosis present

## 2014-03-13 DIAGNOSIS — I219 Acute myocardial infarction, unspecified: Secondary | ICD-10-CM | POA: Diagnosis not present

## 2014-03-13 DIAGNOSIS — I2510 Atherosclerotic heart disease of native coronary artery without angina pectoris: Secondary | ICD-10-CM

## 2014-03-13 DIAGNOSIS — Z9861 Coronary angioplasty status: Secondary | ICD-10-CM

## 2014-03-13 DIAGNOSIS — I2119 ST elevation (STEMI) myocardial infarction involving other coronary artery of inferior wall: Principal | ICD-10-CM | POA: Diagnosis present

## 2014-03-13 DIAGNOSIS — I213 ST elevation (STEMI) myocardial infarction of unspecified site: Secondary | ICD-10-CM

## 2014-03-13 DIAGNOSIS — E1159 Type 2 diabetes mellitus with other circulatory complications: Secondary | ICD-10-CM

## 2014-03-13 DIAGNOSIS — K859 Acute pancreatitis without necrosis or infection, unspecified: Secondary | ICD-10-CM | POA: Diagnosis present

## 2014-03-13 DIAGNOSIS — E1169 Type 2 diabetes mellitus with other specified complication: Secondary | ICD-10-CM | POA: Diagnosis present

## 2014-03-13 DIAGNOSIS — E119 Type 2 diabetes mellitus without complications: Secondary | ICD-10-CM | POA: Diagnosis present

## 2014-03-13 DIAGNOSIS — Z981 Arthrodesis status: Secondary | ICD-10-CM

## 2014-03-13 DIAGNOSIS — R079 Chest pain, unspecified: Secondary | ICD-10-CM | POA: Insufficient documentation

## 2014-03-13 DIAGNOSIS — E782 Mixed hyperlipidemia: Secondary | ICD-10-CM | POA: Diagnosis present

## 2014-03-13 DIAGNOSIS — E785 Hyperlipidemia, unspecified: Secondary | ICD-10-CM | POA: Diagnosis present

## 2014-03-13 DIAGNOSIS — F17200 Nicotine dependence, unspecified, uncomplicated: Secondary | ICD-10-CM | POA: Diagnosis present

## 2014-03-13 DIAGNOSIS — Z79899 Other long term (current) drug therapy: Secondary | ICD-10-CM

## 2014-03-13 DIAGNOSIS — I2582 Chronic total occlusion of coronary artery: Secondary | ICD-10-CM | POA: Diagnosis present

## 2014-03-13 HISTORY — DX: Other chronic pain: G89.29

## 2014-03-13 HISTORY — PX: LEFT HEART CATHETERIZATION WITH CORONARY ANGIOGRAM: SHX5451

## 2014-03-13 HISTORY — DX: Dorsalgia, unspecified: M54.9

## 2014-03-13 LAB — CBC WITH DIFFERENTIAL/PLATELET
Basophils Absolute: 0 10*3/uL (ref 0.0–0.1)
Basophils Relative: 0 % (ref 0–1)
EOS ABS: 0.1 10*3/uL (ref 0.0–0.7)
EOS PCT: 1 % (ref 0–5)
HCT: 43.9 % (ref 39.0–52.0)
Hemoglobin: 15.9 g/dL (ref 13.0–17.0)
LYMPHS ABS: 3.4 10*3/uL (ref 0.7–4.0)
Lymphocytes Relative: 30 % (ref 12–46)
MCH: 32.5 pg (ref 26.0–34.0)
MCHC: 36.2 g/dL — ABNORMAL HIGH (ref 30.0–36.0)
MCV: 89.8 fL (ref 78.0–100.0)
Monocytes Absolute: 0.7 10*3/uL (ref 0.1–1.0)
Monocytes Relative: 6 % (ref 3–12)
Neutro Abs: 7 10*3/uL (ref 1.7–7.7)
Neutrophils Relative %: 63 % (ref 43–77)
PLATELETS: 156 10*3/uL (ref 150–400)
RBC: 4.89 MIL/uL (ref 4.22–5.81)
RDW: 13.4 % (ref 11.5–15.5)
WBC: 11.2 10*3/uL — ABNORMAL HIGH (ref 4.0–10.5)

## 2014-03-13 LAB — I-STAT TROPONIN, ED: TROPONIN I, POC: 0.06 ng/mL (ref 0.00–0.08)

## 2014-03-13 LAB — COMPREHENSIVE METABOLIC PANEL
ALBUMIN: 4 g/dL (ref 3.5–5.2)
ALT: 31 U/L (ref 0–53)
ANION GAP: 18 — AB (ref 5–15)
AST: 23 U/L (ref 0–37)
Alkaline Phosphatase: 90 U/L (ref 39–117)
BUN: 15 mg/dL (ref 6–23)
CALCIUM: 9.6 mg/dL (ref 8.4–10.5)
CO2: 23 mEq/L (ref 19–32)
Chloride: 98 mEq/L (ref 96–112)
Creatinine, Ser: 0.71 mg/dL (ref 0.50–1.35)
GFR calc non Af Amer: >90 mL/min (ref >90)
GLUCOSE: 241 mg/dL — AB (ref 70–99)
Potassium: 3.8 mEq/L (ref 3.7–5.3)
Sodium: 139 mEq/L (ref 137–147)
TOTAL PROTEIN: 7.7 g/dL (ref 6.0–8.3)
Total Bilirubin: 0.2 mg/dL — ABNORMAL LOW (ref 0.3–1.2)

## 2014-03-13 LAB — LIPASE, BLOOD: Lipase: 44 U/L (ref 11–59)

## 2014-03-13 LAB — TROPONIN I

## 2014-03-13 SURGERY — LEFT HEART CATHETERIZATION WITH CORONARY ANGIOGRAM

## 2014-03-13 MED ORDER — NITROGLYCERIN IN D5W 200-5 MCG/ML-% IV SOLN
INTRAVENOUS | Status: AC
  Filled 2014-03-13: qty 250

## 2014-03-13 MED ORDER — HEPARIN (PORCINE) IN NACL 100-0.45 UNIT/ML-% IJ SOLN: 1200.0000 [IU]/h | INTRAMUSCULAR | Status: DC

## 2014-03-13 MED ORDER — METFORMIN HCL 500 MG PO TABS
500.0000 mg | ORAL_TABLET | Freq: Once | ORAL | Status: AC
  Administered 2014-03-13: 500 mg via ORAL
  Filled 2014-03-13: qty 1

## 2014-03-13 MED ORDER — FENTANYL CITRATE 0.05 MG/ML IJ SOLN
INTRAMUSCULAR | Status: AC
  Filled 2014-03-13: qty 2

## 2014-03-13 MED ORDER — MIDAZOLAM HCL 2 MG/2ML IJ SOLN
INTRAMUSCULAR | Status: AC
  Filled 2014-03-13: qty 2

## 2014-03-13 MED ORDER — HEPARIN SODIUM (PORCINE) 5000 UNIT/ML IJ SOLN
4000.0000 [IU] | Freq: Once | INTRAMUSCULAR | Status: AC
  Administered 2014-03-13: 4000 [IU] via INTRAVENOUS

## 2014-03-13 MED ORDER — BIVALIRUDIN 250 MG IV SOLR
INTRAVENOUS | Status: AC
  Filled 2014-03-13: qty 250

## 2014-03-13 MED ORDER — HEPARIN (PORCINE) IN NACL 100-0.45 UNIT/ML-% IJ SOLN
1000.0000 [IU]/h | Freq: Once | INTRAMUSCULAR | Status: AC
  Administered 2014-03-13: 1000 [IU]/h via INTRAVENOUS

## 2014-03-13 MED ORDER — HEPARIN (PORCINE) IN NACL 2-0.9 UNIT/ML-% IJ SOLN
INTRAMUSCULAR | Status: AC
  Filled 2014-03-13: qty 1000

## 2014-03-13 MED ORDER — VERAPAMIL HCL 2.5 MG/ML IV SOLN
INTRAVENOUS | Status: AC
  Filled 2014-03-13: qty 2

## 2014-03-13 MED ORDER — SODIUM CHLORIDE 0.9 % IV SOLN
INTRAVENOUS | Status: DC
  Administered 2014-03-13: 22:00:00 via INTRAVENOUS

## 2014-03-13 MED ORDER — SODIUM CHLORIDE 0.9 % IV BOLUS (SEPSIS)
500.0000 mL | Freq: Once | INTRAVENOUS | Status: AC
  Administered 2014-03-13: 500 mL via INTRAVENOUS

## 2014-03-13 MED ORDER — LIDOCAINE HCL (PF) 1 % IJ SOLN
INTRAMUSCULAR | Status: AC
Start: 2014-03-13 — End: 2014-03-13
  Filled 2014-03-13: qty 30

## 2014-03-13 MED ORDER — PRASUGREL HCL 10 MG PO TABS
ORAL_TABLET | ORAL | Status: AC
  Filled 2014-03-13: qty 6

## 2014-03-13 MED ORDER — NITROGLYCERIN 0.4 MG SL SUBL
0.4000 mg | SUBLINGUAL_TABLET | SUBLINGUAL | Status: DC | PRN
  Administered 2014-03-13 (×2): 0.4 mg via SUBLINGUAL
  Filled 2014-03-13 (×2): qty 1

## 2014-03-13 MED ORDER — HYDROMORPHONE HCL PF 1 MG/ML IJ SOLN
1.0000 mg | Freq: Once | INTRAMUSCULAR | Status: AC
  Administered 2014-03-13: 1 mg via INTRAMUSCULAR
  Filled 2014-03-13: qty 1

## 2014-03-13 MED ORDER — HEPARIN SODIUM (PORCINE) 1000 UNIT/ML IJ SOLN
INTRAMUSCULAR | Status: AC
  Filled 2014-03-13: qty 1

## 2014-03-13 MED ORDER — GI COCKTAIL ~~LOC~~
30.0000 mL | Freq: Once | ORAL | Status: AC
  Administered 2014-03-13: 30 mL via ORAL
  Filled 2014-03-13: qty 30

## 2014-03-13 MED ORDER — ASPIRIN 81 MG PO CHEW
324.0000 mg | CHEWABLE_TABLET | Freq: Once | ORAL | Status: AC
  Administered 2014-03-13: 324 mg via ORAL
  Filled 2014-03-13: qty 4

## 2014-03-13 MED ORDER — MORPHINE SULFATE 4 MG/ML IJ SOLN
4.0000 mg | INTRAMUSCULAR | Status: DC | PRN
  Administered 2014-03-13: 4 mg via INTRAVENOUS
  Filled 2014-03-13: qty 1

## 2014-03-13 MED ORDER — HEPARIN (PORCINE) IN NACL 100-0.45 UNIT/ML-% IJ SOLN
INTRAMUSCULAR | Status: AC
  Filled 2014-03-13: qty 250

## 2014-03-13 NOTE — ED Notes (Signed)
Pt c/o central chest pain x 2 days with burning sensation going up into throat. C/o pain into jaw and left leg. Took a Zyrtec and Percocet with minimal relief.

## 2014-03-13 NOTE — H&P (Signed)
History and Physical Interval Note:  [name]   MRN: [id number] DOB:  Aug 28, 1965   ADMIT DATE: 03/13/2014   03/13/2014 10:34 PM  [name] is a 48 y.o. male with long-standing DM & intermittent pancreatitis.  Presented to [facility] ER with central CP (mostly R sided) intermittent x ~2 days.  Radiates to throat / jaw.  No associated dyspnea, N or V.   Initial EKG was normal.  EDP intended to d/c from ER, but on recheck prior to d/c - pain returned.  EKG with subtle < 28mm STE in III & aVF only without reciprocal changes. Pain relieved with ASA & NTG, but repeat EKG ~10-15 min later showed clear ~2-3 mm STE in II, III, aVF as well as V5-6 with reciprocal changes in I & aVL.  CODE STEMI called ~ ~2158 EKG.   (Was a very difficult / stuttering EKG that was not definite until appropriately repeated -- Time of identification was time of Activation).  The remainder of Cardiovascular ROS: positive for - chest pain, dyspnea on exertion and irregular heartbeat negative for - edema, loss of consciousness, murmur, orthopnea, paroxysmal nocturnal dyspnea, rapid heart rate, shortness of breath or near syncope, TIA/Amaurosis Fugax:  Past Medical History  Diagnosis Date  . Hypercholesteremia   . Anxiety   . Bulging disc   . Borderline diabetes   . Pancreatitis   . Diabetes mellitus without complication   . Chronic back pain    Past Surgical History  Procedure Laterality Date  . Appendectomy    . Abdominal surgery    . Back surgery    . Cardiac catheterization  2003    "trivial coronary artery disease," normal EF    FAMHx: Family History  Problem Relation Age of Onset  . Diabetes Father    SOCHx:  reports that he has been smoking Cigarettes.  He has a 15 pack-year smoking history. He has never used smokeless tobacco. He reports that he does not drink alcohol or use illicit drugs.  ALLERGIES: No Known Allergies  HOME MEDICATIONS: No current facility-administered medications on file prior  to encounter.   Current Outpatient Prescriptions on File Prior to Encounter  Medication Sig Dispense Refill  . ALPRAZolam (XANAX) 1 MG tablet Take 1 mg by mouth 3 (three) times daily as needed for anxiety.       Marland Kitchen gemfibrozil (LOPID) 600 MG tablet Take 1 tablet (600 mg total) by mouth 2 (two) times daily before a meal.  60 tablet  1  . metFORMIN (GLUCOPHAGE) 500 MG tablet Take 500 mg by mouth 2 (two) times daily with a meal.      . oxyCODONE-acetaminophen (PERCOCET/ROXICET) 5-325 MG per tablet Take 1 tablet by mouth every 4 (four) hours as needed for moderate pain or severe pain.  15 tablet  0  . pravastatin (PRAVACHOL) 40 MG tablet Take 40 mg by mouth daily.       IN ER:  Meds ordered this encounter  Medications  . gi cocktail (Maalox,Lidocaine,Donnatal)    Sig:   . HYDROmorphone (DILAUDID) injection 1 mg    Sig:   . metFORMIN (GLUCOPHAGE) tablet 500 mg    Sig:   . aspirin chewable tablet 324 mg    Sig:   . nitroGLYCERIN (NITROSTAT) SL tablet 0.4 mg    Sig:   . morphine 4 MG/ML injection 4 mg    Sig:   . 0.9 %  sodium chloride infusion    Sig:   .  sodium chloride 0.9 % bolus 500 mL    Sig:   . heparin injection 4,000 Units    Sig:   . heparin 100-0.45 UNIT/ML-% infusion    Sig:     [name]   : cabinet override  . heparin ADULT infusion 100 units/mL (25000 units/250 mL)    Sig:   . heparin ADULT infusion 100 units/mL (25000 units/250 mL)    Sig:    Review of Systems  Constitutional: Positive for diaphoresis. Negative for fever, chills, weight loss and malaise/fatigue.  HENT: Negative for hearing loss.   Eyes: Negative for blurred vision and double vision.  Respiratory: Negative for cough, hemoptysis, sputum production, shortness of breath and wheezing.   Cardiovascular: Positive for chest pain and palpitations. Negative for orthopnea, claudication, leg swelling and PND.  Gastrointestinal: Negative for heartburn, nausea, vomiting, constipation, blood in stool and  melena.  Genitourinary: Negative for dysuria, frequency, hematuria and flank pain.  Musculoskeletal: Positive for back pain and joint pain.       Chronic Shoulder & Back pain  Neurological: Negative for dizziness, tingling, tremors, sensory change, speech change, focal weakness, seizures, loss of consciousness and headaches.  Endo/Heme/Allergies: Negative for polydipsia. Does not bruise/bleed easily.  Psychiatric/Behavioral: Negative for depression. The patient is not nervous/anxious.   All other systems reviewed and are negative.  PHYSICAL EXAM:Blood pressure 93/66, pulse 64, temperature 98.6 F (37 C), resp. rate 13, height 6\' 1"  (1.854 m), weight 235 lb (106.595 kg), SpO2 96.00%. General appearance: alert, cooperative, appears stated age, mild distress, moderately obese and pleasant mood & affect Neck: no adenopathy, no carotid bruit, no JVD and supple, symmetrical, trachea midline Lungs: clear to auscultation bilaterally, normal percussion bilaterally and non-labored, good air movement. Heart: regular (brady) rate and rhythm, S1, S2 normal, no murmur, click, rub or gallop and normal apical impulse Abdomen: soft, non-tender; bowel sounds normal; no masses,  no organomegaly Extremities: extremities normal, atraumatic, no cyanosis or edema Pulses: 2+ and symmetric Neurologic: Mental status: Alert, oriented, thought content appropriate Cranial nerves: normal Sensory: normal Motor: grossly normal   Adult ECG Report - with STE noted 2158  Rate: 61 ;  Rhythm: normal sinus rhythm  QRS Axis: 56 ;  PR Interval: 169 ;  QRS Duration: 96 ; QTc: 419;  Voltages: Normal  Conduction Disturbances: none; STE in II, III, avF, ~2 mm, ~1 mm in V5-6, ~1 mm ST depression in V2 & aVL.   Narrative Interpretation:Acute Inferior STE (Injury pattern).    EKG by EMS - Inf STE no longer present.  IMPRESSION & PLAN  [name] has presented today for surgery, with the diagnosis of Inferior STEMI.  The  various methods of treatment have been discussed with the patient and family.   Risks / Complications include, but not limited to: Death, MI, CVA/TIA, VF/VT (with defibrillation), Bradycardia (need for temporary pacer placement), contrast induced nephropathy, bleeding / bruising / hematoma / pseudoaneurysm, vascular or coronary injury (with possible emergent CT or Vascular Surgery), adverse medication reactions, infection.     After consideration of risks, benefits and other options for treatment, the patient has consented to Procedure(s):  LEFT HEART CATHETERIZATION AND CORONARY ANGIOGRAPHY +/- AD [place]  as a surgical intervention.   We will proceed with the planned procedure.  Further plan with Cath Note.  Will need DM coverage (hold Metformin)   [address]. [place], [place]  [id number]  [phone]  03/13/2014 10:34 PM

## 2014-03-13 NOTE — Progress Notes (Signed)
ANTICOAGULATION CONSULT NOTE - Initial Consult  Pharmacy Consult for Heparin Indication: chest pain/ACS  No Known Allergies  Patient Measurements: Height: 6\' 1"  (185.4 cm) Weight: 235 lb (106.595 kg) IBW/kg (Calculated) : 79.9 Heparin Dosing Weight: 102kg  Vital Signs: Temp: 98.6 F (37 C) (09/03 2021) Temp src: Oral (09/03 1926) BP: 93/66 mmHg (09/03 2210) Pulse Rate: 64 (09/03 2210)  Labs:  Recent Labs  03/13/14 1935  HGB 15.9  HCT 43.9  PLT 156  CREATININE 0.71  TROPONINI <0.30    Estimated Creatinine Clearance: 143.1 ml/min (by C-G formula based on Cr of 0.71).   Medical History: Past Medical History  Diagnosis Date  . Hypercholesteremia   . Anxiety   . Bulging disc   . Borderline diabetes   . Pancreatitis   . Diabetes mellitus without complication   . Chronic back pain     Medications:  Scheduled:    Assessment: [name] admitted with chest pain.  Initial troponins negative.  CBC reviewed.  No bleeding noted.   Goal of Therapy:  Heparin level 0.3-0.7 units/ml Monitor platelets by anticoagulation protocol: Yes   Plan:  Give 4000 units bolus x 1 Start heparin infusion at 1200 units/hr Check anti-Xa level in 6 hours and daily while on heparin Continue to monitor H&H and platelets  Thuy Atilano, Lavonia Drafts 03/13/2014,10:18 PM

## 2014-03-13 NOTE — ED Provider Notes (Signed)
CSN: [id number]     Arrival date & time 03/13/14  1918 History   First MD Initiated Contact with Patient 03/13/14 1934     Chief Complaint  Patient presents with  . Chest Pain      HPI Pt was seen at [place]. Per pt, c/o gradual onset and persistence of constant lower mid-sternal chest "pain" for the past 2 days. Pt states the pain waxes and wanes, but never completely goes away. Pt describes the pain as "burning," with radiation up the right side of his chest into the right side of his jaw. Pt states he took "a zyrtec and a percocet" with minimal relief. Denies palpitations, no SOB/cough, no back pain, no abd pain, no N/V/D, no fevers, no rash.    Past Medical History  Diagnosis Date  . Hypercholesteremia   . Anxiety   . Bulging disc   . Borderline diabetes   . Pancreatitis   . Diabetes mellitus without complication   . Chronic back pain    Past Surgical History  Procedure Laterality Date  . Appendectomy    . Abdominal surgery    . Back surgery    . Cardiac catheterization  2003    "trivial coronary artery disease," normal EF   Family History  Problem Relation Age of Onset  . Diabetes Father    History  Substance Use Topics  . Smoking status: Current Every Day Smoker -- 0.50 packs/day for 30 years    Types: Cigarettes  . Smokeless tobacco: Never Used  . Alcohol Use: No    Review of Systems ROS: Statement: All systems negative except as marked or noted in the HPI; Constitutional: Negative for fever and chills. ; ; Eyes: Negative for eye pain, redness and discharge. ; ; ENMT: Negative for ear pain, hoarseness, nasal congestion, sinus pressure and sore throat. ; ; Cardiovascular: +CP. Negative for palpitations, diaphoresis, dyspnea and peripheral edema. ; ; Respiratory: Negative for cough, wheezing and stridor. ; ; Gastrointestinal: Negative for nausea, vomiting, diarrhea, blood in stool, hematemesis, jaundice and rectal bleeding. . ; ; Genitourinary: Negative for dysuria, flank  pain and hematuria. ; ; Musculoskeletal: Negative for back pain and neck pain. Negative for swelling and trauma.; ; Skin: Negative for pruritus, rash, abrasions, blisters, bruising and skin lesion.; ; Neuro: Negative for headache, lightheadedness and neck stiffness. Negative for weakness, altered level of consciousness , altered mental status, extremity weakness, paresthesias, involuntary movement, seizure and syncope.      Allergies  Review of patient's allergies indicates no known allergies.  Home Medications   Prior to Admission medications   Medication Sig Start Date End Date Taking? Authorizing Provider  ALPRAZolam Duanne Moron) 1 MG tablet Take 1 mg by mouth 3 (three) times daily as needed for anxiety.    Yes Historical Provider, MD  gemfibrozil (LOPID) 600 MG tablet Take 1 tablet (600 mg total) by mouth 2 (two) times daily before a meal. 01/21/14  Yes Costin Karlyne Greenspan, MD  metFORMIN (GLUCOPHAGE) 500 MG tablet Take 500 mg by mouth 2 (two) times daily with a meal.   Yes Historical Provider, MD  oxyCODONE-acetaminophen (PERCOCET/ROXICET) 5-325 MG per tablet Take 1 tablet by mouth every 4 (four) hours as needed for moderate pain or severe pain. 01/21/14  Yes Costin Karlyne Greenspan, MD  pravastatin (PRAVACHOL) 40 MG tablet Take 40 mg by mouth daily.   Yes Historical Provider, MD   BP 93/66  Pulse 64  Temp(Src) 98.6 F (37 C)  Resp 13  Ht  6\' 1"  (1.854 m)  Wt 235 lb (106.595 kg)  BMI 31.01 kg/m2  SpO2 96% Physical Exam 1950: Physical examination:  Nursing notes reviewed; Vital signs and O2 SAT reviewed;  Constitutional: Well developed, Well nourished, Well hydrated, In no acute distress; Head:  Normocephalic, atraumatic; Eyes: EOMI, PERRL, No scleral icterus; ENMT: Mouth and pharynx normal, Mucous membranes moist; Neck: Supple, Full range of motion, No lymphadenopathy; Cardiovascular: Regular rate and rhythm, No murmur, rub, or gallop; Respiratory: Breath sounds clear & equal bilaterally, No rales,  rhonchi, wheezes.  Speaking full sentences with ease, Normal respiratory effort/excursion; Chest: Nontender, Movement normal; Abdomen: Soft, Nontender, Nondistended, Normal bowel sounds; Genitourinary: No CVA tenderness; Extremities: Pulses normal, No tenderness, No edema, No calf edema or asymmetry.; Neuro: AA&Ox3, Major CN grossly intact.  Speech clear. No gross focal motor or sensory deficits in extremities.; Skin: Color normal, Warm, Dry.   ED Course  Procedures     EKG Interpretation  Date/Time:  Thursday March 13 2014 19:25:30 EDT  1st EKG on arrival, c/o right sided chest pain Ventricular Rate:  74 PR Interval:  167 QRS Duration: 97 QT Interval:  410 QTC Calculation: 455 R Axis:   44 Text Interpretation:  Sinus rhythm Baseline wander When compared with ECG  of 01/19/2014 No significant change was found Confirmed by [facility]  MD,  Nunzio Cory [phone]) on 03/13/2014 7:40:34 PM     Date: 03/13/2014 @ 2143    2nd EKG, c/o left sided chest pain  Rate: 64  Rhythm: normal sinus rhythm  QRS Axis: normal  Intervals: normal  ST/T Wave abnormalities: nonspecific ST/T changes  Conduction Disutrbances:none  Narrative Interpretation:  Baseline wander with minimal ST elevation leads III and aVF, no ST depressions; needs repeat   Old EKG Reviewed: changes noted from previous EKG completed today.     EKG Interpretation  Date/Time:  Thursday March 13 2014 21:58:10 EDT  3rd EKG, CP resolved Ventricular Rate:  61 PR Interval:  169 QRS Duration: 96 QT Interval:  416 QTC Calculation: 419 R Axis:   56 Text Interpretation:  Sinus rhythm Abnormal R-wave progression, early transition Inferior infarct, acute (RCA) Lateral leads are also involved Probable RV involvement, suggest recording right precordial leads ** ** ACUTE MI / STEMI ** ** is NOW PRESENT Confirmed by [facility]  MD, Nunzio Cory [phone]) on 03/13/2014 10:34:51 PM          MDM  MDM Reviewed: previous chart, nursing note and  vitals Reviewed previous: labs, ECG, MRI and CT scan Interpretation: labs, ECG and x-ray Total time providing critical care: 30-74 minutes. This excludes time spent performing separately reportable procedures and services. Consults: admitting MD and cardiology   CRITICAL CARE Performed by: Alfonzo Feller Total critical care time: 45 Critical care time was exclusive of separately billable procedures and treating other patients. Critical care was necessary to treat or prevent imminent or life-threatening deterioration. Critical care was time spent personally by me on the following activities: development of treatment plan with patient and/or surrogate as well as nursing, discussions with consultants, evaluation of patient's response to treatment, examination of patient, obtaining history from patient or surrogate, ordering and performing treatments and interventions, ordering and review of laboratory studies, ordering and review of radiographic studies, pulse oximetry and re-evaluation of patient's condition.    Results for orders placed during the hospital encounter of 03/13/14  CBC WITH DIFFERENTIAL      Result Value Ref Range   WBC 11.2 (*) 4.0 - 10.5 K/uL   RBC  4.89  4.22 - 5.81 MIL/uL   Hemoglobin 15.9  13.0 - 17.0 g/dL   HCT 43.9  39.0 - 52.0 %   MCV 89.8  78.0 - 100.0 fL   MCH 32.5  26.0 - 34.0 pg   MCHC 36.2 (*) 30.0 - 36.0 g/dL   RDW 13.4  11.5 - 15.5 %   Platelets 156  150 - 400 K/uL   Neutrophils Relative % 63  43 - 77 %   Neutro Abs 7.0  1.7 - 7.7 K/uL   Lymphocytes Relative 30  12 - 46 %   Lymphs Abs 3.4  0.7 - 4.0 K/uL   Monocytes Relative 6  3 - 12 %   Monocytes Absolute 0.7  0.1 - 1.0 K/uL   Eosinophils Relative 1  0 - 5 %   Eosinophils Absolute 0.1  0.0 - 0.7 K/uL   Basophils Relative 0  0 - 1 %   Basophils Absolute 0.0  0.0 - 0.1 K/uL  COMPREHENSIVE METABOLIC PANEL      Result Value Ref Range   Sodium 139  137 - 147 mEq/L   Potassium 3.8  3.7 - 5.3 mEq/L    Chloride 98  96 - 112 mEq/L   CO2 23  19 - 32 mEq/L   Glucose, Bld 241 (*) 70 - 99 mg/dL   BUN 15  6 - 23 mg/dL   Creatinine, Ser 0.71  0.50 - 1.35 mg/dL   Calcium 9.6  8.4 - 10.5 mg/dL   Total Protein 7.7  6.0 - 8.3 g/dL   Albumin 4.0  3.5 - 5.2 g/dL   AST 23  0 - 37 U/L   ALT 31  0 - 53 U/L   Alkaline Phosphatase 90  39 - 117 U/L   Total Bilirubin 0.2 (*) 0.3 - 1.2 mg/dL   GFR calc non Af Amer >90  >90 mL/min   GFR calc Af Amer >90  >90 mL/min   Anion gap 18 (*) 5 - 15  LIPASE, BLOOD      Result Value Ref Range   Lipase 44  11 - 59 U/L  TROPONIN I      Result Value Ref Range   Troponin I <0.30  <0.30 ng/mL  I-STAT TROPOININ, ED      Result Value Ref Range   Troponin i, poc 0.06  0.00 - 0.08 ng/mL   Comment 3            Dg Chest 2 View 03/13/2014   CLINICAL DATA:  Chest pain radiating to jaw.  Diabetes.  EXAM: CHEST  2 VIEW  COMPARISON:  09/15/2013  FINDINGS: The heart size and mediastinal contours are within normal limits. Both lungs are clear. The visualized skeletal structures are unremarkable.  IMPRESSION: No active cardiopulmonary disease.   Electronically Signed   By: Earle Gell M.D.   On: 03/13/2014 19:55    2115:  Feels better after meds and wants to go home now. Has tol PO well without N/V.  Pt endorses he has not been taking his metformin regularly, including today. Will dose while in the ED. Pt with reassuring EKG and troponin after 2 days of constant atypical symptoms, as well as essentially normal cardiac cath in 2003. Doubt PE as cause for symptoms with low risk Wells. Dx and testing d/w pt and family.  Questions answered.  Verb understanding.  2130:  Pt's family now in the room with pt. I was called back into pt room by  ED RN: pt told her that now his LEFT chest "just started to hurt" with radiation into his LEFT jaw and he was "burning up." I re-evaluated pt. Pt was rubbing his RIGHT jaw on my arrival to the exam room, stating his "chest hurt again" but now was  pointing to his LEFT chest and stating the discomfort radiated into his LEFT jaw. Pt now with different symptoms from arrival complaint. He does have several cardiac risk factors; will dose ASA, SL ntg, IV morphine, obtain repeat EKG and troponin. Will also observation admit. T/C to [facility] Dr. Marin Comment, case discussed, including:  HPI, pertinent PM/SHx, VS/PE, dx testing, ED course and treatment:  Agreeable to observation admit, requests to write temporary orders, obtain tele bed, aware I am repeating troponin and EKG.   2150:  Repeat troponin pending. Pt now pain free after ASA and SL ntg x1. EKG #2 with NSR and baseline wander with minimal ST elevation in leads III and aVF, and without ST depressions. T/C to [facility] Interventional Cardiologist Dr. Ellyn Hack, case discussed, including:  HPI, pertinent PM/SHx, VS/PE, dx testing, ED course and treatment:  He has viewed the 2 EKG's from pt's ED visit today; agrees that 1st EKG is unchanged from pt's previous EKG's and 2nd EKG does not have a clear acute MI, as it has baseline wander which can create confusion regarding any ST elevations. While we were on the phone, pt's 2nd troponin was still pending. Cards Dr. Ellyn Hack requested to repeat another EKG with pt laying still/without baseline wander to further clarify. 3rd EKG completed: +acute STEMI leads II, III, aVF, V5-V6 with ST depression leads I and aVL. Dr. Ellyn Hack has viewed this 3rd EKG and agrees. Code STEMI called and Dr. Ellyn Hack will take pt to the cath lab at [facility]. IV heparin bolus and gtt ordered. 2nd troponin resulted (not elevated). EMS to transport to [facility] cath lab. Tri[facility] Dr. Marin Comment updated. Pt and family updated.        Francine Graven, DO 03/16/14 1700

## 2014-03-14 DIAGNOSIS — Z955 Presence of coronary angioplasty implant and graft: Secondary | ICD-10-CM | POA: Insufficient documentation

## 2014-03-14 DIAGNOSIS — I259 Chronic ischemic heart disease, unspecified: Secondary | ICD-10-CM | POA: Insufficient documentation

## 2014-03-14 DIAGNOSIS — Z79899 Other long term (current) drug therapy: Secondary | ICD-10-CM | POA: Diagnosis not present

## 2014-03-14 DIAGNOSIS — Z981 Arthrodesis status: Secondary | ICD-10-CM | POA: Diagnosis not present

## 2014-03-14 DIAGNOSIS — I2510 Atherosclerotic heart disease of native coronary artery without angina pectoris: Secondary | ICD-10-CM | POA: Diagnosis present

## 2014-03-14 DIAGNOSIS — E785 Hyperlipidemia, unspecified: Secondary | ICD-10-CM | POA: Diagnosis present

## 2014-03-14 DIAGNOSIS — I2119 ST elevation (STEMI) myocardial infarction involving other coronary artery of inferior wall: Secondary | ICD-10-CM | POA: Diagnosis present

## 2014-03-14 DIAGNOSIS — I219 Acute myocardial infarction, unspecified: Secondary | ICD-10-CM | POA: Diagnosis present

## 2014-03-14 DIAGNOSIS — F411 Generalized anxiety disorder: Secondary | ICD-10-CM | POA: Diagnosis present

## 2014-03-14 DIAGNOSIS — E1159 Type 2 diabetes mellitus with other circulatory complications: Secondary | ICD-10-CM

## 2014-03-14 DIAGNOSIS — I2582 Chronic total occlusion of coronary artery: Secondary | ICD-10-CM | POA: Diagnosis present

## 2014-03-14 DIAGNOSIS — F17200 Nicotine dependence, unspecified, uncomplicated: Secondary | ICD-10-CM | POA: Diagnosis present

## 2014-03-14 DIAGNOSIS — E119 Type 2 diabetes mellitus without complications: Secondary | ICD-10-CM | POA: Diagnosis present

## 2014-03-14 DIAGNOSIS — Z9861 Coronary angioplasty status: Secondary | ICD-10-CM

## 2014-03-14 LAB — GLUCOSE, CAPILLARY
GLUCOSE-CAPILLARY: 217 mg/dL — AB (ref 70–99)
GLUCOSE-CAPILLARY: 249 mg/dL — AB (ref 70–99)
Glucose-Capillary: 279 mg/dL — ABNORMAL HIGH (ref 70–99)

## 2014-03-14 LAB — TROPONIN I
TROPONIN I: 1.17 ng/mL — AB (ref <0.30)
Troponin I: 1.72 ng/mL (ref <0.30)
Troponin I: 1.56 ng/mL (ref <0.30)

## 2014-03-14 LAB — CBC
HEMATOCRIT: 40.3 % (ref 39.0–52.0)
HEMOGLOBIN: 13.9 g/dL (ref 13.0–17.0)
MCH: 31.2 pg (ref 26.0–34.0)
MCHC: 34.5 g/dL (ref 30.0–36.0)
MCV: 90.6 fL (ref 78.0–100.0)
Platelets: 157 10*3/uL (ref 150–400)
RBC: 4.45 MIL/uL (ref 4.22–5.81)
RDW: 13.5 % (ref 11.5–15.5)
WBC: 9.6 10*3/uL (ref 4.0–10.5)

## 2014-03-14 LAB — BASIC METABOLIC PANEL
Anion gap: 9 (ref 5–15)
BUN: 14 mg/dL (ref 6–23)
CO2: 28 meq/L (ref 19–32)
Calcium: 8.6 mg/dL (ref 8.4–10.5)
Chloride: 103 mEq/L (ref 96–112)
Creatinine, Ser: 0.74 mg/dL (ref 0.50–1.35)
GFR calc Af Amer: >90 mL/min (ref >90)
GFR calc non Af Amer: >90 mL/min (ref >90)
GLUCOSE: 170 mg/dL — AB (ref 70–99)
Potassium: 4.7 mEq/L (ref 3.7–5.3)
SODIUM: 140 meq/L (ref 137–147)

## 2014-03-14 LAB — HEMOGLOBIN A1C
Hgb A1c MFr Bld: 8.9 % — ABNORMAL HIGH (ref <5.7)
Mean Plasma Glucose: 209 mg/dL — ABNORMAL HIGH (ref <117)

## 2014-03-14 LAB — MRSA PCR SCREENING: MRSA by PCR: NEGATIVE

## 2014-03-14 MED ORDER — NICOTINE 14 MG/24HR TD PT24
14.0000 mg | MEDICATED_PATCH | Freq: Every day | TRANSDERMAL | Status: DC
  Administered 2014-03-14 – 2014-03-15 (×2): 14 mg via TRANSDERMAL
  Filled 2014-03-14 (×2): qty 1

## 2014-03-14 MED ORDER — SODIUM CHLORIDE 0.9 % IV SOLN
1.0000 mL/kg/h | INTRAVENOUS | Status: AC
  Administered 2014-03-14: 1 mL/kg/h via INTRAVENOUS

## 2014-03-14 MED ORDER — SIMVASTATIN 40 MG PO TABS
40.0000 mg | ORAL_TABLET | Freq: Every day | ORAL | Status: DC
  Administered 2014-03-14: 40 mg via ORAL
  Filled 2014-03-14 (×2): qty 1

## 2014-03-14 MED ORDER — MORPHINE SULFATE 2 MG/ML IJ SOLN
2.0000 mg | INTRAMUSCULAR | Status: DC | PRN
Start: 2014-03-14 — End: 2014-03-14

## 2014-03-14 MED ORDER — ALPRAZOLAM 0.5 MG PO TABS: 1.0000 mg | ORAL_TABLET | Freq: Three times a day (TID) | ORAL | Status: DC | PRN

## 2014-03-14 MED ORDER — ACETAMINOPHEN 325 MG PO TABS: 650.0000 mg | ORAL_TABLET | ORAL | Status: DC | PRN

## 2014-03-14 MED ORDER — OXYCODONE-ACETAMINOPHEN 5-325 MG PO TABS: 1.0000 | ORAL_TABLET | ORAL | Status: DC | PRN

## 2014-03-14 MED ORDER — PRASUGREL HCL 10 MG PO TABS
10.0000 mg | ORAL_TABLET | Freq: Every day | ORAL | Status: DC
  Administered 2014-03-14 – 2014-03-15 (×2): 10 mg via ORAL
  Filled 2014-03-14 (×2): qty 1

## 2014-03-14 MED ORDER — SODIUM CHLORIDE 0.9 % IV SOLN
1.7500 mg/kg/h | INTRAVENOUS | Status: DC
  Administered 2014-03-14: 1.75 mg/kg/h via INTRAVENOUS
  Filled 2014-03-14: qty 250

## 2014-03-14 MED ORDER — INSULIN ASPART 100 UNIT/ML ~~LOC~~ SOLN
0.0000 [IU] | Freq: Three times a day (TID) | SUBCUTANEOUS | Status: DC
  Administered 2014-03-14: 2 [IU] via SUBCUTANEOUS
  Administered 2014-03-14: 5 [IU] via SUBCUTANEOUS
  Administered 2014-03-14: 3 [IU] via SUBCUTANEOUS
  Administered 2014-03-15: 1 [IU] via SUBCUTANEOUS
  Administered 2014-03-15: 3 [IU] via SUBCUTANEOUS

## 2014-03-14 MED ORDER — ASPIRIN 81 MG PO CHEW
81.0000 mg | CHEWABLE_TABLET | Freq: Every day | ORAL | Status: DC
  Administered 2014-03-14 – 2014-03-15 (×2): 81 mg via ORAL
  Filled 2014-03-14 (×2): qty 1

## 2014-03-14 MED ORDER — SODIUM CHLORIDE 0.9 % IV SOLN: INTRAVENOUS | Status: DC

## 2014-03-14 MED ORDER — ONDANSETRON HCL 4 MG/2ML IJ SOLN: 4.0000 mg | Freq: Four times a day (QID) | INTRAMUSCULAR | Status: DC | PRN

## 2014-03-14 MED ORDER — CARVEDILOL 3.125 MG PO TABS
3.1250 mg | ORAL_TABLET | Freq: Two times a day (BID) | ORAL | Status: DC
  Administered 2014-03-14 – 2014-03-15 (×3): 3.125 mg via ORAL
  Filled 2014-03-14 (×5): qty 1

## 2014-03-14 MED FILL — Sodium Chloride IV Soln 0.9%: INTRAVENOUS | Qty: 50 | Status: AC

## 2014-03-14 NOTE — Progress Notes (Signed)
CARDIAC REHAB PHASE I   PRE:  Rate/Rhythm: 58 SB  BP:  Supine:   Sitting: 110/67  Standing:    SaO2: 95%ra  MODE:  Ambulation: 550 ft   POST:  Rate/Rhythm: 54  BP:  Supine:   Sitting: 124/69  Standing:    SaO2: 100%ra 1325-1447 Pt walked 550 ft with steady gait. Tolerated well. No CP. MI education completed with pt and wife who voiced understanding. Gave smoking cessation handout and encouraged pt to call 1800quitnow as needed. Pt may use nicotine patches. Gave carb counting and heart healthy diets. Let pt know his HGA1C 8.9 and that he needs to watch his diet and exercise to bring down. Pt has not been watching starches and sugars-drinking regular soft drinks, adding sugar to coffee. Has effient booklet and stressed importance with stent. Reviewed NTG use. Gave wife listing of diabetic videos and encouraged them to watch since pt has never had formal teaching. Discussed CRP 2. Pt declined at this time and will discuss with cardiologist later if he changes his mind.    Graylon Good, RN BSN  03/14/2014 2:44 PM

## 2014-03-14 NOTE — Care Management Note (Signed)
    Page 1 of 1   03/14/2014     9:42:14 AM CARE MANAGEMENT NOTE 03/14/2014  Patient:  RAYDAN, SCHLABACH   Account Number:  [id number]  Date Initiated:  03/14/2014  Documentation initiated by:  Elissa Hefty  Subjective/Objective Assessment:   adm w mi     Action/Plan:   lives w fam, pcp dr Eddie Candle   Anticipated DC Date:     Anticipated DC Plan:  [place]  CM consult  Medication Assistance      Choice offered to / List presented to:             Status of service:   Medicare Important Message given?   (If response is "NO", the following Medicare IM given date fields will be blank) Date Medicare IM given:   Medicare IM given by:   Date Additional Medicare IM given:   Additional Medicare IM given by:    Discharge Disposition:  HOME/SELF CARE  Per UR Regulation:  Reviewed for med. necessity/level of care/duration of stay  If discussed at Long Length of Stay Meetings, dates discussed:    Comments:  9/4 0941 debbie Chi Woodham rn,bsn left pt 30day free and copay assist card for effient.

## 2014-03-14 NOTE — Progress Notes (Signed)
       Patient Name: [name] Date of Encounter: 03/14/2014    SUBJECTIVE: Presented with stuttering inferior ST elevation myocardial infarction. The culprit vessel, distal RCA, was treated with a bare-metal stent post dilated to 5 mm in diameter. A nice final result was obtained. Patient is doing well this morning. There is no current complaint of angina or dyspnea.  TELEMETRY:  Normal sinus rhythm. Occasional PVCs but no ventricular tachycardia has been noted. Filed Vitals:   03/14/14 0400 03/14/14 0430 03/14/14 0500 03/14/14 0754  BP: 105/71 103/71 99/64 113/53  Pulse: 57 51  56  Temp: 98.3 F (36.8 C)   97.7 F (36.5 C)  TempSrc: Oral   Oral  Resp: 10 11 16 18   Height:      Weight:      SpO2: 97% 96%  96%    Intake/Output Summary (Last 24 hours) at 03/14/14 1209 Last data filed at 03/14/14 0800  Gross per 24 hour  Intake 1206.22 ml  Output   1150 ml  Net  56.22 ml   LABS: Basic Metabolic Panel:  Recent Labs  03/13/14 1935 03/14/14 0224  NA 139 140  K 3.8 4.7  CL 98 103  CO2 23 28  GLUCOSE 241* 170*  BUN 15 14  CREATININE 0.71 0.74  CALCIUM 9.6 8.6   CBC:  Recent Labs  03/13/14 1935 03/14/14 0224  WBC 11.2* 9.6  NEUTROABS 7.0  --   HGB 15.9 13.9  HCT 43.9 40.3  MCV 89.8 90.6  PLT 156 157   Cardiac Enzymes:  Recent Labs  03/13/14 1935 03/14/14 0625  TROPONINI <0.30 1.72*   BNP: No components found with this basename: POCBNP,  Hemoglobin A1C:  Recent Labs  03/14/14 0224  HGBA1C 8.9*   Radiology/Studies:  Chest x-ray reveals no acute abnormality  Physical Exam: Blood pressure 113/53, pulse 56, temperature 97.7 F (36.5 C), temperature source Oral, resp. rate 18, height 6\' 1"  (1.854 m), weight 230 lb 4.8 oz (104.463 kg), SpO2 96.00%. Weight change:   Wt Readings from Last 3 Encounters:  03/14/14 230 lb 4.8 oz (104.463 kg)  03/14/14 230 lb 4.8 oz (104.463 kg)  01/19/14 232 lb (105.235 kg)    Chest is clear to auscultation and  percussion Cardiac exam shows no S4 gallop otherwise unremarkable Extremities reveal no edema  ASSESSMENT:  1. Acute, stuttering, inferior ST elevation MI treated with a bare-metal stent post dilated to 5.0 mm in diameter.  2. Poorly controlled diabetes, complicated 3. Hyperlipidemia 4. History of pancreatitis   Plan:  1. Dual antiplatelet therapy for at least 30 days and preferably one year 2. Transferred to telemetry 3. Statin therapy 4. Phase I cardiac rehabilitation 5. He is a candidate for early discharge Saturday morning and no later than Sunday morning assuming no electrical, mechanical, or ischemic complications.   Demetrios Isaacs 03/14/2014, 12:09 PM

## 2014-03-14 NOTE — CV Procedure (Addendum)
CARDIAC CATHETERIZATION PERCUTANEOUS CORONARY INTERVENTION REPORT  NAME:  [name]   MRN: [id number] DOB:  [birth year]   ADMIT DATE: 03/13/2014 Procedure Date: 03/14/2014  INTERVENTIONAL CARDIOLOGIST: Leonie Man, M.D., MS PRIMARY CARE PROVIDER: [name], MD PRIMARY CARDIOLOGIST: New to [facility]  PATIENT:  [name] is a 48 y.o. male with a PMH of HLD, DM2 & intermittent pancreatitis who smokes ~1  Ppd.  He Presented to [facility] ER with central CP (mostly R sided) intermittent x ~2 days. Radiates to throat / jaw. No associated dyspnea, N or V.  Initial EKG was normal. EDP intended to d/c from ER, but on recheck prior to d/c - pain returned. EKG with subtle < 67m STE in III & aVF only without reciprocal changes. Pain relieved with ASA & NTG, but repeat EKG ~10-15 min later showed clear ~2-3 mm STE in II, III, aVF as well as V5-6 with reciprocal changes in I & aVL. CODE STEMI called ~ ~2158 EKG. (Was a very difficult / stuttering EKG that was not definite until appropriately repeated -- Time of identification was time of Activation).  324 mg ASA, 4000 Units Heparin. [facility] he was pain free & Inferior STE has normalized.   Given the dynamic nature of his symptoms & EKG changes, I felt it appropriate to proceed with Cardiac Catheterization & PCI.  PRE-OPERATIVE DIAGNOSIS:    Inferior STEMI  PROCEDURES PERFORMED:    Left Heart Catheterization with Native Coronary Angiography via RIGHT RADIAL Artery   Left Ventriculography  PERCUTANEOUS CORONARY INTERVENTION OF DISTAL RCA 99% THROMBOTIC SUB-TOTAL OCCLUSION -- VERIFLEX (BARE METAL STENT) BMS 5.0 MM X 15 MM --> 5.6 MM  PROCEDURE: The [facility] in the fasting state and prepped and draped in the usual sterile fashion for Right Radial artery access. A modified Allen's test was performed on the Right wrist demonstrating excellent  collateral flow for radial access.   Sterile technique was used including antiseptics, cap, gloves, gown, hand hygiene, mask and sheet. Skin prep: Chlorhexidine.   Consent: Risks of procedure as well as the alternatives and risks of each were explained to the (patient/caregiver). Consent for procedure obtained.   Time Out: Verified patient identification, verified procedure, site/side was marked, verified correct patient position, special equipment/implants available, medications/allergies/relevent history reviewed, required imaging and test results available. Performed.  Access:   Right Radial Artery: 6 Fr Sheath -  Seldinger Technique (Angiocath Micropuncture Kit)  Radial Cocktail - 10 mL; IV Angiomax Bolus & Infusion   Left Heart Catheterization: 5Fr Catheters advanced or exchanged over a Long Exchange Safety J-wire; TIG 4.0 catheter advanced first.  Left & Right Coronary Artery Cineangiography: TIG 4.0 Catheter  Right Coronary Artery PCI: 6 Fr JR4 Guide Catheter   LV Hemodynamics (LV Gram): Angled Pigtail   Sheath removed in the Cardiac Cath Lab with TR Band placement for hemostasis.  TR Band: 2400  Hours; 11 mL air  FINDINGS:  Hemodynamics:   Central Aortic Pressure / Mean: 109/67/87 mmHg  Left Ventricular Pressure / LVEDP: 106/16/26 mmHg  Left Ventriculography:  EF: 45-50%  Wall Motion: Distal-apical inferior   Coronary Anatomy:  Dominance: Right  Left Main: Normal caliber vessel that bifurcates into the LAD & Circumflex.  Angiographically normal. LAD: Normal caliber vessel with a ~20 % tubular stenosis between 1st & 2nd Diagonal branches (D1 and D2).  The vessel tapers towards, but does  not reach the apex.  D1: Moderate caliber vessel.  Proximal.  Angiographically normal.  D2: Moderate to Large caliber vessel from the mid LAD.  Angiographically normal  Left Circumflex: Normal caliber vessel that bifurcates into a lateral Obtuse Marginal (OM1) and Left Posterolateral  1 (LPL1) after giving off a small caliber distal AV groove branch.  OM1: Moderate caliber. Angiographically normal.  LPL1: Small to moderate caliber.  Angiographically normal   RCA: Very large caliber, dominant vessel that bifurcates distally into the Right Posterior Descending Artery (RPDA) and the  Right Posterior AV Groove Branch (RPAV).  The vessel is essentially normal with the exception of a focal 99% thrombotic, sub-total occlusion in the distal vessel just beyond the crux.    RPDA: Large caliber, tortuous vessel.  Reaches the inferoapex and wraps to the anteroapex. Angiographically normal  RPL Sysytem:The RPAV Large caliber vessel that essentially terminates as a large/extensive Right Posterolateral Branch (RPL).  Angiographically normal.  After reviewing the initial angiography, the culprit lesion was thought to be 99% subtotal occlusion of the distal RCA.  Preparation were made to proceed with PCI on this lesion.  Percutaneous Coronary Intervention:   Guide: 6 Fr   JR4 Guidewire: ProWater  Lesion: 99% thrombotic subtotal occlusion of distal RCA --> reduced to 0%  TIMI 2 flow pre-PCI --> TIMI 3 Flow post-PCI.  Predilation Balloon: Emerge 3.0 mm x 12 mm; Balloon was occlusive across the lesion.  2332 hrs  8 Atm x 20 Sec Stent: BS VeriFlex BMS 5.0 mm x 15 mm;   12 Atm x 30 Sec, 16 Atm x 30 Sec  Diameter ~5.5 mm Post-dilation Balloon: [place] Emerge 5.5 mm x 12 mm;   16 Atm x 30 Sec  Final Diameter: 5.6 mm  Post deployment angiography in multiple views, with and without guidewire in place revealed excellent stent deployment and lesion coverage.  There was no evidence of dissection or perforation.  MEDICATIONS:  Anesthesia:  Local Lidocaine 2 ml  Sedation:  1 mg IV Versed, 50 mcg IV fentanyl ;   Premedication: 4000 Units IV Heparin, 324 mg ASA, Dilaudid 2 mg IV, NTG SL x 2  Omnipaque Contrast: 160 ml  Anticoagulation:  Angiomax Bolus & drip  Anti-Platelet Agent:   Effient 60 mg PO Radial Cocktail: 5 mg Verapamil, 400 mcg NTG, 2 ml 2% Lidocaine in 10 ml NS Intracoronary NTG: 200 mcg Normal Saline Bolus 250 mL  PATIENT DISPOSITION:    The patient was transferred to the PACU holding area in a hemodynamicaly stable, chest pain free condition.  The patient tolerated the procedure well, and there were no complications.  EBL:   < 10 ml  The patient was stable before, during, and after the procedure.  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  Preserved LVEF with mild Distal to Apical Inferior Hypokinesis.  Diabetes on Metformin.  PLAN OF CARE:  Admit to TCU with plan for Fast Track Discharge on Day 3.  With Large BMS stent = DAPT x 1 month mandatory, but with ACS  X 1 year recommended.  Will start low dose Carvedilol (had borderline bradycardic rhythm in Cath Lab)  Statin ordered  Will need Insulin coverage while holding Metformin  Has chronic pain - Oxycodone ordered.  Smoking cessation.   Leonie Man, M.D., M.S. [facility] [phone]  [address] [place], [place]  [address]  [phone]  03/14/2014 12:03 AM

## 2014-03-14 NOTE — Progress Notes (Signed)
Utilization Review Completed.Donne Anon T9/10/2013

## 2014-03-14 NOTE — Progress Notes (Signed)
Inpatient Diabetes Program Recommendations  AACE/ADA: New Consensus Statement on Inpatient Glycemic Control (2013)  Target Ranges:  Prepandial:   less than 140 mg/dL      Peak postprandial:   less than 180 mg/dL (1-2 hours)      Critically ill patients:  140 - 180 mg/dL  Results for TENG, DECOU (MRN [id number]) as of 03/14/2014 08:51  Ref. Range 03/13/2014 19:35 03/14/2014 02:24  Glucose Latest Range: 70-99 mg/dL 241 (H) 170 (H)   Results for AVEION, NGUYEN (MRN [id number]) as of 03/14/2014 08:51  Ref. Range 03/14/2014 08:06  Glucose-Capillary Latest Range: 70-99 mg/dL 279 (H)    Diabetes history: DM2 Outpatient Diabetes medications: Metformin 500 mg BID Current orders for Inpatient glycemic control: Novolog 0-9 units AC  Inpatient Diabetes Program Recommendations Correction (SSI): Please consider increasing Novolog correction to moderate scale and add bedtime correction scale.  Thanks, Barnie Alderman, RN, MSN, CCRN Diabetes Coordinator Inpatient Diabetes Program [phone] (Team Pager) [phone] (AP office) [phone] [facility] office)

## 2014-03-14 NOTE — Progress Notes (Signed)
03/14/2014 10:50 AM  Trop I   Of 1.72 called. Expected  Results from pt with STEMI. Dr. Tamala Julian  Aware. Nathanyal Ashmead, Carolynn Comment

## 2014-03-15 DIAGNOSIS — I219 Acute myocardial infarction, unspecified: Secondary | ICD-10-CM

## 2014-03-15 LAB — GLUCOSE, CAPILLARY
Glucose-Capillary: 144 mg/dL — ABNORMAL HIGH (ref 70–99)
Glucose-Capillary: 202 mg/dL — ABNORMAL HIGH (ref 70–99)

## 2014-03-15 MED ORDER — PRASUGREL HCL 10 MG PO TABS: 10.0000 mg | ORAL_TABLET | Freq: Every day | ORAL | Status: DC

## 2014-03-15 MED ORDER — NICOTINE 14 MG/24HR TD PT24: 14.0000 mg | MEDICATED_PATCH | Freq: Every day | TRANSDERMAL | Status: DC

## 2014-03-15 MED ORDER — CARVEDILOL 3.125 MG PO TABS: 3.1250 mg | ORAL_TABLET | Freq: Two times a day (BID) | ORAL | Status: DC

## 2014-03-15 MED ORDER — METFORMIN HCL 500 MG PO TABS: 500.0000 mg | ORAL_TABLET | Freq: Two times a day (BID) | ORAL | Status: DC

## 2014-03-15 MED ORDER — ASPIRIN 81 MG PO CHEW: 81.0000 mg | CHEWABLE_TABLET | Freq: Every day | ORAL | Status: DC

## 2014-03-15 NOTE — Progress Notes (Signed)
       Patient Name: [name] Date of Encounter: 03/15/2014    SUBJECTIVE: Presented with stuttering inferior ST elevation myocardial infarction. The culprit vessel, distal RCA, was treated with a bare-metal stent post dilated to 5 mm in diameter. A nice final result was obtained. Patient denies CP or dyspnea this AM  TELEMETRY:  Normal sinus rhythm. Occasional PVCs but no ventricular tachycardia has been noted. Filed Vitals:   03/14/14 1200 03/14/14 1344 03/14/14 2106 03/15/14 0520  BP: 104/47 110/67 105/58 109/70  Pulse:  56 56 67  Temp: 97.8 F (36.6 C) 97.7 F (36.5 C) 98.3 F (36.8 C) 97.8 F (36.6 C)  TempSrc:  Oral Oral Oral  Resp: 20 19 18 16   Height:  6\' 1"  (1.854 m)    Weight:  230 lb (104.327 kg)  224 lb 10.4 oz (101.9 kg)  SpO2: 98% 98% 98% 97%    Intake/Output Summary (Last 24 hours) at 03/15/14 1210 Last data filed at 03/15/14 0520  Gross per 24 hour  Intake    240 ml  Output    400 ml  Net   -160 ml   LABS: Basic Metabolic Panel:  Recent Labs  03/13/14 1935 03/14/14 0224  NA 139 140  K 3.8 4.7  CL 98 103  CO2 23 28  GLUCOSE 241* 170*  BUN 15 14  CREATININE 0.71 0.74  CALCIUM 9.6 8.6   CBC:  Recent Labs  03/13/14 1935 03/14/14 0224  WBC 11.2* 9.6  NEUTROABS 7.0  --   HGB 15.9 13.9  HCT 43.9 40.3  MCV 89.8 90.6  PLT 156 157   Cardiac Enzymes:  Recent Labs  03/14/14 0625 03/14/14 1440 03/14/14 2023  TROPONINI 1.72* 1.56* 1.17*   Hemoglobin A1C:  Recent Labs  03/14/14 0224  HGBA1C 8.9*   Radiology/Studies:  Chest x-ray reveals no acute abnormality  Physical Exam: Blood pressure 109/70, pulse 67, temperature 97.8 F (36.6 C), temperature source Oral, resp. rate 16, height 6\' 1"  (1.854 m), weight 224 lb 10.4 oz (101.9 kg), SpO2 97.00%. Weight change: -5 lb (-2.268 kg)  Wt Readings from Last 3 Encounters:  03/15/14 224 lb 10.4 oz (101.9 kg)  03/15/14 224 lb 10.4 oz (101.9 kg)  01/19/14 232 lb (105.235 kg)   HEENT  normal Neck supple  Chest is clear to auscultation and percussion Cardiac exam shows RRR Abd soft, not tender Extremities reveal no edema Neuro grossly intact  ASSESSMENT:  1. Acute, stuttering, inferior ST elevation MI treated with a bare-metal stent post dilated to 5.0 mm in diameter.  2. Poorly controlled diabetes, complicated 3. Hyperlipidemia 4. History of pancreatitis   Plan: continue aspirin, effient and statin. Continue carvedilol at present dose. This can be advanced as an outpatient as tolerated by blood pressure. Patient can be discharged today and followup in [place]. Patient instructed on importance of discontinuing tobacco use.  > 30 min PA and physician time D2  Alex Gardener 03/15/2014, 12:10 PM

## 2014-03-15 NOTE — Discharge Summary (Signed)
Discharge Summary   Patient ID: SANDERS MANNINEN MRN: [id number], DOB/AGE: 1965/10/27 48 y.o. Admit date: 03/13/2014 D/C date:     03/15/2014  Primary Cardiologist: Wants to follow up in [place].   Principal Problem:   ST-segment elevation myocardial infarction (STEMI) of inferior wall Active Problems:   Pancreatitis   Hyperlipidemia   Diabetes mellitus   CAD S/P percutaneous coronary angioplasty    Admission Dates: 03/13/14-03/15/14 Discharge Diagnosis: stuttering inferior STEMI s/p BMS to dRCA (culprit vessel) post dilated to 5.6 mm in diameter   HPI: [name] is a 48 y.o. male with long-standing DM & intermittent pancreatitis. Presented to [facility] ER with central CP (mostly R sided) intermittent x ~2 days. Radiates to throat / jaw. No associated dyspnea, N/V.  Initial EKG was normal. EDP intended to d/c from ER, but on recheck prior to d/c - pain returned. EKG with subtle < 53m STE in III & aVF only without reciprocal changes. Pain relieved with ASA & NTG, but repeat EKG ~10-15 min later showed clear ~2-3 mm STE in II, III, aVF as well as V5-6 with reciprocal changes in I & aVL. CODE STEMI called ~ ~2158 EKG. (Was a very difficult / stuttering EKG that was not definite until appropriately repeated -- Time of identification was time of Activation).   Hospital Course  CAD- Acute, stuttering/inferior STEMI s/p emergent LHC on 03/14/14 and treated with a BMS and post dilated to 5.6 mm in diameter. Preserved LVEF with mild distal to apical inferior hypokinesis.  -- Pk measured troponin 1.72  -- DAPT x 1 month mandatory, but with ACS X 1 year recommended.  -- Continue ASA/Effient, carvedilol, pravastatin. BB can be up-titrated as an outpatient as BP tolerates.  Poorly controlled diabetes- f/u with PCP -- Hold metformin until 03/16/14 (> 48 hours post contrast exposure with cardiac cath.)  Hyperlipidemia - continue statin and Lopid  History of pancreatitis- stable  Tobacco abuse- patient  instructed on importance of discontinuing tobacco use   The patient has had an uncomplicated hospital course and is recovering well. The radial catheter site is stable. He has been seen by Dr. CStanford Breedtoday and deemed ready for discharge home. Voicemail has been left for follow up appointment to be [place] Smoking cessation was disscussed in length. Discharge medications are listed below.   Discharge Vitals: Blood pressure 109/70, pulse 67, temperature 97.8 F (36.6 C), temperature source Oral, resp. rate 16, height '6\' 1"'  (1.854 m), weight 224 lb 10.4 oz (101.9 kg), SpO2 97.00%.  Labs: Lab Results  Component Value Date   WBC 9.6 03/14/2014   HGB 13.9 03/14/2014   HCT 40.3 03/14/2014   MCV 90.6 03/14/2014   PLT 157 03/14/2014     Recent Labs Lab 03/13/14 1935 03/14/14 0224  NA 139 140  K 3.8 4.7  CL 98 103  CO2 23 28  BUN 15 14  CREATININE 0.71 0.74  CALCIUM 9.6 8.6  PROT 7.7  --   BILITOT 0.2*  --   ALKPHOS 90  --   ALT 31  --   AST 23  --   GLUCOSE 241* 170*    Recent Labs  03/13/14 1935 03/14/14 0625 03/14/14 1440 03/14/14 2023  TROPONINI <0.30 1.72* 1.56* 1.17*   Lab Results  Component Value Date   CHOL 345* 01/20/2014   HDL 20* 01/20/2014   LDLCALC UNABLE TO CALCULATE IF TRIGLYCERIDE OVER 400 mg/dL 01/20/2014   TRIG 1132* 01/20/2014  Diagnostic Studies/Procedures   Dg Chest 2 View  03/13/2014   CLINICAL DATA:  Chest pain radiating to jaw.  Diabetes.  EXAM: CHEST  2 VIEW  COMPARISON:  09/15/2013  FINDINGS: The heart size and mediastinal contours are within normal limits. Both lungs are clear. The visualized skeletal structures are unremarkable.  IMPRESSION: No active cardiopulmonary disease.   Electronically Signed   By: Earle Gell M.D.   On: 03/13/2014 19:55   Mr 3d Recon At Scanner  02/14/2014   CLINICAL DATA:  Evaluate pancreatic tail lesion on CT  EXAM: MRI ABDOMEN WITHOUT AND WITH CONTRAST (INCLUDING MRCP)  TECHNIQUE: Multiplanar multisequence MR  imaging of the abdomen was performed both before and after the administration of intravenous contrast. Heavily T2-weighted images of the biliary and pancreatic ducts were obtained, and three-dimensional MRCP images were rendered by post processing.  CONTRAST:  27m MULTIHANCE GADOBENATE DIMEGLUMINE 529 MG/ML IV SOLN  COMPARISON:  CT abdomen pelvis dated 01/19/2014  FINDINGS: Motion degraded images.  Pancreas is within normal limits. Specifically, the lesion along the pancreatic tail on prior CT is not evident on the current MRI. No evidence of differential enhancement. No pancreatic ductal dilatation. No pancreatic inflammatory changes suggest acute pancreatitis. No evidence of pancreatic divisum.  Mild to moderate hepatic steatosis.  No suspicious hepatic lesions.  Spleen and adrenal glands are within normal limits.  Gallbladder is unremarkable. No intrahepatic or extrahepatic ductal dilatation.  Two tiny left renal cysts (series 26/image 30). Right kidney is within normal limits. No hydronephrosis.  No abdominal ascites.  No suspicious abdominal lymphadenopathy.  No focal osseous lesions.  IMPRESSION: Motion degraded images.  Normal MRI of the pancreas. Specifically, the pancreatic tail lesion on prior CT is not evident on the current MRI. No evidence of pancreatic divisum.  Mild to moderate hepatic steatosis.   Electronically Signed   By: SJulian HyM.D.   On: 02/14/2014 16:51   Mr Abd W/wo Cm/mrcp  02/14/2014   CLINICAL DATA:  Evaluate pancreatic tail lesion on CT  EXAM: MRI ABDOMEN WITHOUT AND WITH CONTRAST (INCLUDING MRCP)  TECHNIQUE: Multiplanar multisequence MR imaging of the abdomen was performed both before and after the administration of intravenous contrast. Heavily T2-weighted images of the biliary and pancreatic ducts were obtained, and three-dimensional MRCP images were rendered by post processing.  CONTRAST:  29mMULTIHANCE GADOBENATE DIMEGLUMINE 529 MG/ML IV SOLN  COMPARISON:  CT abdomen  pelvis dated 01/19/2014  FINDINGS: Motion degraded images.  Pancreas is within normal limits. Specifically, the lesion along the pancreatic tail on prior CT is not evident on the current MRI. No evidence of differential enhancement. No pancreatic ductal dilatation. No pancreatic inflammatory changes suggest acute pancreatitis. No evidence of pancreatic divisum.  Mild to moderate hepatic steatosis.  No suspicious hepatic lesions.  Spleen and adrenal glands are within normal limits.  Gallbladder is unremarkable. No intrahepatic or extrahepatic ductal dilatation.  Two tiny left renal cysts (series 26/image 30). Right kidney is within normal limits. No hydronephrosis.  No abdominal ascites.  No suspicious abdominal lymphadenopathy.  No focal osseous lesions.  IMPRESSION: Motion degraded images.  Normal MRI of the pancreas. Specifically, the pancreatic tail lesion on prior CT is not evident on the current MRI. No evidence of pancreatic divisum.  Mild to moderate hepatic steatosis.   Electronically Signed   By: SrJulian Hy.D.   On: 02/14/2014 16:51    CARDIAC CATHETERIZATION PERCUTANEOUS CORONARY INTERVENTION REPORT  NAME: BuMILES LEYDARN: [id number]: 10/1965/07/30  ADMIT DATE: 03/13/2014  Procedure Date: 03/14/2014  INTERVENTIONAL CARDIOLOGIST: Leonie Man, M.D., MS  PRIMARY CARE PROVIDER: [name], MD  PRIMARY CARDIOLOGIST: New to [facility]  PATIENT: [name] is a 48 y.o. male with a PMH of HLD, DM2 & intermittent pancreatitis who smokes ~1 Ppd. He Presented to [facility] ER with central CP (mostly R sided) intermittent x ~2 days. Radiates to throat / jaw. No associated dyspnea, N or V.  Initial EKG was normal. EDP intended to d/c from ER, but on recheck prior to d/c - pain returned. EKG with subtle < 65m STE in III & aVF only without reciprocal changes. Pain relieved with ASA & NTG, but repeat EKG ~10-15 min later showed clear ~2-3 mm STE in II, III, aVF as well as V5-6 with reciprocal  changes in I & aVL. CODE STEMI called ~ ~2158 EKG. (Was a very difficult / stuttering EKG that was not definite until appropriately repeated -- Time of identification was time of Activation). 324 mg ASA, 4000 Units Heparin.  [facility] he was pain free & Inferior STE has normalized.  Given the dynamic nature of his symptoms & EKG changes, I felt it appropriate to proceed with Cardiac Catheterization & PCI.  PRE-OPERATIVE DIAGNOSIS:  Inferior STEMI PROCEDURES PERFORMED:  Left Heart Catheterization with Native Coronary Angiography via RIGHT RADIAL Artery  Left Ventriculography  PERCUTANEOUS CORONARY INTERVENTION OF DISTAL RCA 99% THROMBOTIC SUB-TOTAL OCCLUSION -- VERIFLEX (BARE METAL STENT) BMS 5.0 MM X 15 MM --> 5.6 MM PROCEDURE: The [facility] in the fasting state and prepped and draped in the usual sterile fashion for Right Radial artery access. A modified Allen's test was performed on the Right wrist demonstrating excellent collateral flow for radial access. Sterile technique was used including antiseptics, cap, gloves, gown, hand hygiene, mask and sheet. Skin prep: Chlorhexidine.  Consent: Risks of procedure as well as the alternatives and risks of each were explained to the (patient/caregiver). Consent for procedure obtained.  Time Out: Verified patient identification, verified procedure, site/side was marked, verified correct patient position, special equipment/implants available, medications/allergies/relevent history reviewed, required imaging and test results available. Performed.  Access:  Right Radial Artery: 6 Fr Sheath - Seldinger Technique (Angiocath Micropuncture Kit)  Radial Cocktail - 10 mL; IV Angiomax Bolus & Infusion  Left Heart Catheterization: 5Fr Catheters advanced or exchanged over a Long Exchange Safety J-wire; TIG 4.0 catheter advanced first.  Left & Right Coronary Artery  Cineangiography: TIG 4.0 Catheter  Right Coronary Artery PCI: 6 Fr JR4 Guide Catheter  LV Hemodynamics (LV Gram): Angled Pigtail  Sheath removed in the Cardiac Cath Lab with TR Band placement for hemostasis.  TR Band: 2400 Hours; 11 mL air  FINDINGS:  Hemodynamics:  Central Aortic Pressure / Mean: 109/67/87 mmHg  Left Ventricular Pressure / LVEDP: 106/16/26 mmHg Left Ventriculography:  EF: 45-50%  Wall Motion: Distal-apical inferior  Coronary Anatomy:  Dominance: Right Left Main: Normal caliber vessel that bifurcates into the LAD & Circumflex. Angiographically normal. LAD: Normal caliber vessel with a ~20 % tubular stenosis between 1st & 2nd Diagonal branches (D1 and D2). The vessel tapers towards, but does not reach the apex.  D1: Moderate caliber vessel. Proximal. Angiographically normal.  D2: Moderate to Large caliber vessel from the mid LAD. Angiographically normal  Left Circumflex: Normal caliber vessel that bifurcates into a lateral Obtuse Marginal (OM1) and Left Posterolateral 1 (LPL1) after  giving off a small caliber distal AV groove branch.  OM1: Moderate caliber. Angiographically normal.  LPL1: Small to moderate caliber. Angiographically normal RCA: Very large caliber, dominant vessel that bifurcates distally into the Right Posterior Descending Artery (RPDA) and the Right Posterior AV Groove Branch (RPAV). The vessel is essentially normal with the exception of a focal 99% thrombotic, sub-total occlusion in the distal vessel just beyond the crux.  RPDA: Large caliber, tortuous vessel. Reaches the inferoapex and wraps to the anteroapex. Angiographically normal  RPL Sysytem:The RPAV Large caliber vessel that essentially terminates as a large/extensive Right Posterolateral Branch (RPL). Angiographically normal. After reviewing the initial angiography, the culprit lesion was thought to be 99% subtotal occlusion of the distal RCA. Preparation were made to proceed with PCI on this lesion.    Percutaneous Coronary Intervention:  Guide: 6 Fr JR4 Guidewire: ProWater  Lesion: 99% thrombotic subtotal occlusion of distal RCA --> reduced to 0%  TIMI 2 flow pre-PCI --> TIMI 3 Flow post-PCI.  Predilation Balloon: Emerge 3.0 mm x 12 mm; Balloon was occlusive across the lesion. 2332 hrs  8 Atm x 20 Sec Stent: BS VeriFlex BMS 5.0 mm x 15 mm;  12 Atm x 30 Sec, 16 Atm x 30 Sec  Diameter ~5.5 mm Post-dilation Balloon: [place] Emerge 5.5 mm x 12 mm;  16 Atm x 30 Sec  Final Diameter: 5.6 mm Post deployment angiography in multiple views, with and without guidewire in place revealed excellent stent deployment and lesion coverage. There was no evidence of dissection or perforation.  MEDICATIONS:  Anesthesia: Local Lidocaine 2 ml Sedation: 1 mg IV Versed, 50 mcg IV fentanyl ;  Premedication: 4000 Units IV Heparin, 324 mg ASA, Dilaudid 2 mg IV, NTG SL x 2 Omnipaque Contrast: 160 ml  Anticoagulation: Angiomax Bolus & drip  Anti-Platelet Agent: Effient 60 mg PO Radial Cocktail: 5 mg Verapamil, 400 mcg NTG, 2 ml 2% Lidocaine in 10 ml NS  Intracoronary NTG: 200 mcg  Normal Saline Bolus 250 mL PATIENT DISPOSITION:  The patient was transferred to the PACU holding area in a hemodynamicaly stable, chest pain free condition.  The patient tolerated the procedure well, and there were no complications. EBL: < 10 ml  The patient was stable before, during, and after the procedure. POST-OPERATIVE DIAGNOSIS:  Severe Single vessel CAD with 99% thrombotic sub-total occlusion of the distal RCA treated successfully with a Single VeriFlex BMS 5.0 mm x 15 mm - post-dilated to 5.6 mm.  Preserved LVEF with mild Distal to Apical Inferior Hypokinesis.  Diabetes on Metformin. PLAN OF CARE:  Admit to TCU with plan for Fast Track Discharge on Day 3.  With Large BMS stent = DAPT x 1 month mandatory, but with ACS X 1 year recommended.  Will start low dose Carvedilol (had borderline bradycardic rhythm in Cath Lab)  Statin  ordered  Will need Insulin coverage while holding Metformin  Has chronic pain - Oxycodone ordered.  Smoking cessation.    Discharge Medications     Medication List         ALPRAZolam 1 MG tablet  Commonly known as:  XANAX  Take 1 mg by mouth 3 (three) times daily as needed for anxiety.     aspirin 81 MG chewable tablet  Chew 1 tablet (81 mg total) by mouth daily.     carvedilol 3.125 MG tablet  Commonly known as:  COREG  Take 1 tablet (3.125 mg total) by mouth 2 (two) times daily with a meal.  gemfibrozil 600 MG tablet  Commonly known as:  LOPID  Take 1 tablet (600 mg total) by mouth 2 (two) times daily before a meal.     metFORMIN 500 MG tablet  Commonly known as:  GLUCOPHAGE  Take 1 tablet (500 mg total) by mouth 2 (two) times daily with a meal.  Start taking on:  03/16/2014     nicotine 14 mg/24hr patch  Commonly known as:  NICODERM CQ - dosed in mg/24 hours  Place 1 patch (14 mg total) onto the skin daily.     oxyCODONE-acetaminophen 5-325 MG per tablet  Commonly known as:  PERCOCET/ROXICET  Take 1 tablet by mouth every 4 (four) hours as needed for moderate pain or severe pain.     prasugrel 10 MG Tabs tablet  Commonly known as:  EFFIENT  Take 1 tablet (10 mg total) by mouth daily.     pravastatin 40 MG tablet  Commonly known as:  PRAVACHOL  Take 40 mg by mouth daily.        Disposition   The patient will be discharged in stable condition to home.  Follow-up Information   Follow up with Carlyle Dolly, F, MD. (The office will call to make an appointment with a provider in [facility] for cardiology follow up. Please contact them if you do not hear by next wedesnday. You should be seen in 1-2 weeks. )    Specialty:  Cardiology   Contact information:   [place] [address] [phone]       Follow up with [name], MD. (Please follow up about your diabetes as soon as possible)    Specialty:  Family Medicine    Contact information:   [address] [place] [address] [phone]         Duration of Discharge Encounter: Greater than 30 minutes including physician and PA time.  Mable Fill R PA-C 03/15/2014, 4:46 PM

## 2014-03-15 NOTE — Discharge Instructions (Signed)
Do not resume your metformin until tomorrow morning.

## 2014-03-16 NOTE — Discharge Summary (Signed)
See progress notes Kelbi Renstrom  

## 2014-03-18 LAB — POCT ACTIVATED CLOTTING TIME: Activated Clotting Time: 568 seconds

## 2014-03-18 LAB — GLUCOSE, CAPILLARY: GLUCOSE-CAPILLARY: 170 mg/dL — AB (ref 70–99)

## 2014-03-28 ENCOUNTER — Encounter: Payer: Self-pay | Admitting: *Deleted

## 2014-03-28 ENCOUNTER — Ambulatory Visit (INDEPENDENT_AMBULATORY_CARE_PROVIDER_SITE_OTHER): Payer: PRIVATE HEALTH INSURANCE | Admitting: Cardiology

## 2014-03-28 ENCOUNTER — Encounter: Payer: Self-pay | Admitting: Cardiology

## 2014-03-28 VITALS — BP 134/77 | HR 66 | Ht 73.0 in | Wt 221.4 lb

## 2014-03-28 DIAGNOSIS — E785 Hyperlipidemia, unspecified: Secondary | ICD-10-CM

## 2014-03-28 DIAGNOSIS — I2510 Atherosclerotic heart disease of native coronary artery without angina pectoris: Secondary | ICD-10-CM

## 2014-03-28 DIAGNOSIS — E118 Type 2 diabetes mellitus with unspecified complications: Secondary | ICD-10-CM

## 2014-03-28 DIAGNOSIS — F17200 Nicotine dependence, unspecified, uncomplicated: Secondary | ICD-10-CM

## 2014-03-28 DIAGNOSIS — Z720 Tobacco use: Secondary | ICD-10-CM

## 2014-03-28 MED ORDER — NITROGLYCERIN 0.4 MG SL SUBL: 0.4000 mg | SUBLINGUAL_TABLET | SUBLINGUAL | Status: DC | PRN

## 2014-03-28 MED ORDER — PRASUGREL HCL 10 MG PO TABS: 10.0000 mg | ORAL_TABLET | Freq: Every day | ORAL | Status: DC

## 2014-03-28 MED ORDER — FENOFIBRATE 145 MG PO TABS: 145.0000 mg | ORAL_TABLET | Freq: Every day | ORAL | Status: DC

## 2014-03-28 NOTE — Patient Instructions (Signed)
   Stop Gemfibrozil  Start Fenofibrate 140mg  daily. Sent to pharmacy.  Rx for Nitroglycerin. Sent to pharmacy.  Samples of Effient 10mg  given. 14 tablets. Continue all other medications.   Return to work note given. Follow up in  6 weeks.

## 2014-03-28 NOTE — Progress Notes (Signed)
Clinical Summary [name] is a 48 y.o.male seen today for a hospital follow up appointment, this is our first visit together. He is seen for the following medical problems.  1. CAD - admit 03/2014 with inferior STEMI, received BMS to RCA. LVEF 45-50% by LV gram, did not have echo.  - started on ASA/effient,  - denies any chest pain. Denies any SOB, no DOE - referred for cardiac rehab however patient states unable to afford at this time  2. Hyperlipidemia - 01/2014 TC 345 TG 1132 HDL 20 - gemfibrozil is causing shoulder pains.  - tolerates pravastatin, reports prior myaglias on a prior statins  3. Tobacco abuse - still smoking - has nicotine patches but has not started yet  4. DM type II - uncontrolled, elevated HgbA1c in hospital at 8.9   Past Medical History  Diagnosis Date  . Hypercholesteremia   . Anxiety   . Bulging disc   . Borderline diabetes   . Pancreatitis   . Diabetes mellitus without complication   . Chronic back pain      No Known Allergies   Current Outpatient Prescriptions  Medication Sig Dispense Refill  . ALPRAZolam (XANAX) 1 MG tablet Take 1 mg by mouth 3 (three) times daily as needed for anxiety.       Marland Kitchen aspirin 81 MG chewable tablet Chew 1 tablet (81 mg total) by mouth daily.      . carvedilol (COREG) 3.125 MG tablet Take 1 tablet (3.125 mg total) by mouth 2 (two) times daily with a meal.  60 tablet  11  . gemfibrozil (LOPID) 600 MG tablet Take 1 tablet (600 mg total) by mouth 2 (two) times daily before a meal.  60 tablet  1  . metFORMIN (GLUCOPHAGE) 500 MG tablet Take 1 tablet (500 mg total) by mouth 2 (two) times daily with a meal.  60 tablet  11  . nicotine (NICODERM CQ - DOSED IN MG/24 HOURS) 14 mg/24hr patch Place 1 patch (14 mg total) onto the skin daily.  28 patch  0  . oxyCODONE-acetaminophen (PERCOCET/ROXICET) 5-325 MG per tablet Take 1 tablet by mouth every 4 (four) hours as needed for moderate pain or severe pain.  15 tablet  0  .  prasugrel (EFFIENT) 10 MG TABS tablet Take 1 tablet (10 mg total) by mouth daily.  30 tablet  11  . pravastatin (PRAVACHOL) 40 MG tablet Take 40 mg by mouth daily.       No current facility-administered medications for this visit.     Past Surgical History  Procedure Laterality Date  . Appendectomy    . Abdominal surgery    . Back surgery    . Cardiac catheterization  2003    "trivial coronary artery disease," normal EF     No Known Allergies    Family History  Problem Relation Age of Onset  . Diabetes Father      Social History [name] reports that he has been smoking Cigarettes.  He has a 15 pack-year smoking history. He has never used smokeless tobacco. [name] reports that he does not drink alcohol.   Review of Systems CONSTITUTIONAL: No weight loss, fever, chills, weakness or fatigue.  HEENT: Eyes: No visual loss, blurred vision, double vision or yellow sclerae.No hearing loss, sneezing, congestion, runny nose or sore throat.  SKIN: No rash or itching.  CARDIOVASCULAR: per HPI RESPIRATORY: No shortness of breath, cough or sputum.  GASTROINTESTINAL: No anorexia, nausea, vomiting or  diarrhea. No abdominal pain or blood.  GENITOURINARY: No burning on urination, no polyuria NEUROLOGICAL: No headache, dizziness, syncope, paralysis, ataxia, numbness or tingling in the extremities. No change in bowel or bladder control.  MUSCULOSKELETAL: No muscle, back pain, joint pain or stiffness.  LYMPHATICS: No enlarged nodes. No history of splenectomy.  PSYCHIATRIC: No history of depression or anxiety.  ENDOCRINOLOGIC: No reports of sweating, cold or heat intolerance. No polyuria or polydipsia.  Marland Kitchen   Physical Examination p 66 bp 134/77 Wt 221 lbs BMI 29 Gen: resting comfortably, no acute distress HEENT: no scleral icterus, pupils equal round and reactive, no palptable cervical adenopathy,  CV: RRR, no m/r/g, no JVD Resp: Clear to auscultation bilaterally GI: abdomen is soft,  non-tender, non-distended, normal bowel sounds, no hepatosplenomegaly MSK: extremities are warm, no edema.  Skin: warm, no rash Neuro:  no focal deficits Psych: appropriate affect   Diagnostic Studies 03/14/14 Cath FINDINGS:  Hemodynamics:  Central Aortic Pressure / Mean: 109/67/87 mmHg  Left Ventricular Pressure / LVEDP: 106/16/26 mmHg Left Ventriculography:  EF: 45-50%  Wall Motion: Distal-apical inferior  Coronary Anatomy:  Dominance: Right Left Main: Normal caliber vessel that bifurcates into the LAD & Circumflex. Angiographically normal. LAD: Normal caliber vessel with a ~20 % tubular stenosis between 1st & 2nd Diagonal branches (D1 and D2). The vessel tapers towards, but does not reach the apex.  D1: Moderate caliber vessel. Proximal. Angiographically normal.  D2: Moderate to Large caliber vessel from the mid LAD. Angiographically normal  Left Circumflex: Normal caliber vessel that bifurcates into a lateral Obtuse Marginal (OM1) and Left Posterolateral 1 (LPL1) after giving off a small caliber distal AV groove Lyne Khurana.  OM1: Moderate caliber. Angiographically normal.  LPL1: Small to moderate caliber. Angiographically normal RCA: Very large caliber, dominant vessel that bifurcates distally into the Right Posterior Descending Artery (RPDA) and the Right Posterior AV Groove Tramar Brueckner (RPAV). The vessel is essentially normal with the exception of a focal 99% thrombotic, sub-total occlusion in the distal vessel just beyond the crux.  RPDA: Large caliber, tortuous vessel. Reaches the inferoapex and wraps to the anteroapex. Angiographically normal  RPL Sysytem:The RPAV Large caliber vessel that essentially terminates as a large/extensive Right Posterolateral Jordayn Mink (RPL). Angiographically normal. After reviewing the initial angiography, the culprit lesion was thought to be 99% subtotal occlusion of the distal RCA. Preparation were made to proceed with PCI on this lesion.  Percutaneous Coronary  Intervention:  Guide: 6 Fr JR4 Guidewire: ProWater  Lesion: 99% thrombotic subtotal occlusion of distal RCA --> reduced to 0%  TIMI 2 flow pre-PCI --> TIMI 3 Flow post-PCI.  Predilation Balloon: Emerge 3.0 mm x 12 mm; Balloon was occlusive across the lesion. 2332 hrs  8 Atm x 20 Sec Stent: BS VeriFlex BMS 5.0 mm x 15 mm;  12 Atm x 30 Sec, 16 Atm x 30 Sec  Diameter ~5.5 mm Post-dilation Balloon: [place] Emerge 5.5 mm x 12 mm;  16 Atm x 30 Sec  Final Diameter: 5.6 mm Post deployment angiography in multiple views, with and without guidewire in place revealed excellent stent deployment and lesion coverage. There was no evidence of dissection or perforation.  MEDICATIONS:  Anesthesia: Local Lidocaine 2 ml Sedation: 1 mg IV Versed, 50 mcg IV fentanyl ;  Premedication: 4000 Units IV Heparin, 324 mg ASA, Dilaudid 2 mg IV, NTG SL x 2 Omnipaque Contrast: 160 ml  Anticoagulation: Angiomax Bolus & drip  Anti-Platelet Agent: Effient 60 mg PO Radial Cocktail: 5 mg Verapamil, 400 mcg NTG,  2 ml 2% Lidocaine in 10 ml NS  Intracoronary NTG: 200 mcg  Normal Saline Bolus 250 mL PATIENT DISPOSITION:  The patient was transferred to the PACU holding area in a hemodynamicaly stable, chest pain free condition.  The patient tolerated the procedure well, and there were no complications. EBL: < 10 ml  The patient was stable before, during, and after the procedure. POST-OPERATIVE DIAGNOSIS:  Severe Single vessel CAD with 99% thrombotic sub-total occlusion of the distal RCA treated successfully with a Single VeriFlex BMS 5.0 mm x 15 mm - post-dilated to 5.6 mm.  Preserved LVEF with mild Distal to Apical Inferior Hypokinesis.  Diabetes on Metformin. PLAN OF CARE:  Admit to TCU with plan for Fast Track Discharge on Day 3.  With Large BMS stent = DAPT x 1 month mandatory, but with ACS X 1 year recommended.  Will start low dose Carvedilol (had borderline bradycardic rhythm in Cath Lab)  Statin ordered  Will need  Insulin coverage while holding Metformin  Has chronic pain - Oxycodone ordered.  Smoking cessation.     Assessment and Plan  1. CAD - no current symptoms - recent STEMI with BMS to RCA - plan for 1 year of DAPT, continue until 03/2015  2. Hyperlipidemia - reports myalgias on prior statins, tolerating pravastatin, will continue - elevated TGs, likely mainly related to uncontrolled DM. He has been on gemfibrozil, describes some myalgias on this medicine. Will change to fenofibrate  3. Tobacco abuse - counseled on cessation, encouraged to use nicotine patches  4. DM type II - followed by Dr Hilma Favors, currently not at goal - defer management to primary      Arnoldo Lenis, M.D., F.A.C.C.

## 2014-05-27 ENCOUNTER — Encounter: Payer: Self-pay | Admitting: Cardiology

## 2014-05-27 ENCOUNTER — Ambulatory Visit (INDEPENDENT_AMBULATORY_CARE_PROVIDER_SITE_OTHER): Payer: PRIVATE HEALTH INSURANCE | Admitting: Cardiology

## 2014-05-27 ENCOUNTER — Ambulatory Visit (INDEPENDENT_AMBULATORY_CARE_PROVIDER_SITE_OTHER): Payer: PRIVATE HEALTH INSURANCE | Admitting: *Deleted

## 2014-05-27 VITALS — BP 112/68 | HR 65 | Ht 73.0 in | Wt 221.0 lb

## 2014-05-27 DIAGNOSIS — E785 Hyperlipidemia, unspecified: Secondary | ICD-10-CM

## 2014-05-27 DIAGNOSIS — I2510 Atherosclerotic heart disease of native coronary artery without angina pectoris: Secondary | ICD-10-CM

## 2014-05-27 DIAGNOSIS — Z23 Encounter for immunization: Secondary | ICD-10-CM

## 2014-05-27 DIAGNOSIS — Z720 Tobacco use: Secondary | ICD-10-CM

## 2014-05-27 MED ORDER — LISINOPRIL 2.5 MG PO TABS: 2.5000 mg | ORAL_TABLET | Freq: Every day | ORAL | Status: DC

## 2014-05-27 MED ORDER — FENOFIBRATE 145 MG PO TABS: 145.0000 mg | ORAL_TABLET | Freq: Every day | ORAL | Status: DC

## 2014-05-27 MED ORDER — CARVEDILOL 3.125 MG PO TABS: 3.1250 mg | ORAL_TABLET | Freq: Two times a day (BID) | ORAL | Status: DC

## 2014-05-27 MED ORDER — PRAVASTATIN SODIUM 40 MG PO TABS: 40.0000 mg | ORAL_TABLET | Freq: Every day | ORAL | Status: DC

## 2014-05-27 MED ORDER — PRASUGREL HCL 10 MG PO TABS: 10.0000 mg | ORAL_TABLET | Freq: Every day | ORAL | Status: DC

## 2014-05-27 NOTE — Progress Notes (Signed)
Clinical Summary [name] is a 48 y.o.male seen today for follow up of the following medical problems.   1. CAD - admit 03/2014 with inferior STEMI, received BMS to RCA. LVEF 45-50% by LV gram. -denies any chest pain, no SOB or DOE.  - compliant with meds including DAPT with ASA and effient   2. Hyperlipidemia - last lipid panel 01/2014 TC 345 TG 1132 HDL 20 - gemfibrozil caused muscle aches, last visit changed to fenofibrate which he is tolerating better.  - tolerates pravastatin, reports prior myaglias on a prior statins  3. Tobacco abuse - still smoking - not using nictotine patches at this time  4. DM type II - uncontrolled, elevated HgbA1c in hospital at 8.9   Past Medical History  Diagnosis Date  . Hypercholesteremia   . Anxiety   . Bulging disc   . Borderline diabetes   . Pancreatitis   . Diabetes mellitus without complication   . Chronic back pain      No Known Allergies   Current Outpatient Prescriptions  Medication Sig Dispense Refill  . ALPRAZolam (XANAX) 1 MG tablet Take 1 mg by mouth 3 (three) times daily as needed for anxiety.     Marland Kitchen aspirin 81 MG chewable tablet Chew 1 tablet (81 mg total) by mouth daily.    . carvedilol (COREG) 3.125 MG tablet Take 1 tablet (3.125 mg total) by mouth 2 (two) times daily with a meal. 60 tablet 11  . fenofibrate (TRICOR) 145 MG tablet Take 1 tablet (145 mg total) by mouth daily. 30 tablet 6  . metFORMIN (GLUCOPHAGE) 500 MG tablet Take 1 tablet (500 mg total) by mouth 2 (two) times daily with a meal. 60 tablet 11  . nicotine (NICODERM CQ - DOSED IN MG/24 HOURS) 14 mg/24hr patch Place 14 mg onto the skin as needed.    . nitroGLYCERIN (NITROSTAT) 0.4 MG SL tablet Place 1 tablet (0.4 mg total) under the tongue every 5 (five) minutes as needed for chest pain. 25 tablet 3  . oxyCODONE-acetaminophen (PERCOCET/ROXICET) 5-325 MG per tablet Take 1 tablet by mouth every 4 (four) hours as needed for moderate pain or severe pain. 15  tablet 0  . prasugrel (EFFIENT) 10 MG TABS tablet Take 1 tablet (10 mg total) by mouth daily. 30 tablet 11  . prasugrel (EFFIENT) 10 MG TABS tablet Take 1 tablet (10 mg total) by mouth daily. 14 tablet 0  . pravastatin (PRAVACHOL) 40 MG tablet Take 40 mg by mouth daily.     No current facility-administered medications for this visit.     Past Surgical History  Procedure Laterality Date  . Appendectomy    . Abdominal surgery    . Back surgery    . Cardiac catheterization  2003    "trivial coronary artery disease," normal EF     No Known Allergies    Family History  Problem Relation Age of Onset  . Diabetes Father      Social History Mr. Ralston reports that he has been smoking Cigarettes.  He has a 30 pack-year smoking history. He has never used smokeless tobacco. Mr. Winterbottom reports that he does not drink alcohol.   Review of Systems CONSTITUTIONAL: No weight loss, fever, chills, weakness or fatigue.  HEENT: Eyes: No visual loss, blurred vision, double vision or yellow sclerae.No hearing loss, sneezing, congestion, runny nose or sore throat.  SKIN: No rash or itching.  CARDIOVASCULAR: per HPI RESPIRATORY: No shortness of breath, cough or  sputum.  GASTROINTESTINAL: No anorexia, nausea, vomiting or diarrhea. No abdominal pain or blood.  GENITOURINARY: No burning on urination, no polyuria NEUROLOGICAL: No headache, dizziness, syncope, paralysis, ataxia, numbness or tingling in the extremities. No change in bowel or bladder control.  MUSCULOSKELETAL: No muscle, back pain, joint pain or stiffness.  LYMPHATICS: No enlarged nodes. No history of splenectomy.  PSYCHIATRIC: No history of depression or anxiety.  ENDOCRINOLOGIC: No reports of sweating, cold or heat intolerance. No polyuria or polydipsia.  Marland Kitchen   Physical Examination p 65 bp 112/68 Wt 221 lbs BMI 29 Gen: resting comfortably, no acute distress HEENT: no scleral icterus, pupils equal round and reactive, no palptable  cervical adenopathy,  CV: RRR, no m/r/g, no JVD, no carotid bruits Resp: Clear to auscultation bilaterally GI: abdomen is soft, non-tender, non-distended, normal bowel sounds, no hepatosplenomegaly MSK: extremities are warm, no edema.  Skin: warm, no rash Neuro:  no focal deficits Psych: appropriate affect   Diagnostic Studies  03/14/14 Cath FINDINGS:  Hemodynamics:   Central Aortic Pressure / Mean: 109/67/87 mmHg   Left Ventricular Pressure / LVEDP: 106/16/26 mmHg Left Ventriculography:   EF: 45-50%   Wall Motion: Distal-apical inferior Coronary Anatomy:   Dominance: Right  Left Main: Normal caliber vessel that bifurcates into the LAD & Circumflex. Angiographically normal. LAD: Normal caliber vessel with a ~20 % tubular stenosis between 1st & 2nd Diagonal branches (D1 and D2). The vessel tapers towards, but does not reach the apex.   D1: Moderate caliber vessel. Proximal. Angiographically normal.   D2: Moderate to Large caliber vessel from the mid LAD. Angiographically normal Left Circumflex: Normal caliber vessel that bifurcates into a lateral Obtuse Marginal (OM1) and Left Posterolateral 1 (LPL1) after giving off a small caliber distal AV groove branch.   OM1: Moderate caliber. Angiographically normal.   LPL1: Small to moderate caliber. Angiographically normal  RCA: Very large caliber, dominant vessel that bifurcates distally into the Right Posterior Descending Artery (RPDA) and the Right Posterior AV Groove Branch (RPAV). The vessel is essentially normal with the exception of a focal 99% thrombotic, sub-total occlusion in the distal vessel just beyond the crux.   RPDA: Large caliber, tortuous vessel. Reaches the inferoapex and wraps to the anteroapex. Angiographically normal   RPL Sysytem:The RPAV Large caliber vessel that essentially terminates as a large/extensive Right Posterolateral Branch (RPL). Angiographically normal. After reviewing the initial  angiography, the culprit lesion was thought to be 99% subtotal occlusion of the distal RCA. Preparation were made to proceed with PCI on this lesion.  Percutaneous Coronary Intervention:  Guide: 6 Fr JR4 Guidewire: ProWater  Lesion: 99% thrombotic subtotal occlusion of distal RCA --> reduced to 0%  TIMI 2 flow pre-PCI --> TIMI 3 Flow post-PCI.  Predilation Balloon: Emerge 3.0 mm x 12 mm; Balloon was occlusive across the lesion. 2332 hrs   8 Atm x 20 Sec  Stent: BS VeriFlex BMS 5.0 mm x 15 mm;   12 Atm x 30 Sec, 16 Atm x 30 Sec   Diameter ~5.5 mm  Post-dilation Balloon: [place] Emerge 5.5 mm x 12 mm;   16 Atm x 30 Sec   Final Diameter: 5.6 mm  Post deployment angiography in multiple views, with and without guidewire in place revealed excellent stent deployment and lesion coverage. There was no evidence of dissection or perforation. MEDICATIONS:   Anesthesia: Local Lidocaine 2 ml  Sedation: 1 mg IV Versed, 50 mcg IV fentanyl ;   Premedication: 4000 Units IV Heparin, 324 mg ASA,  Dilaudid 2 mg IV, NTG SL x 2  Omnipaque Contrast: 160 ml   Anticoagulation: Angiomax Bolus & drip   Anti-Platelet Agent: Effient 60 mg PO  Radial Cocktail: 5 mg Verapamil, 400 mcg NTG, 2 ml 2% Lidocaine in 10 ml NS   Intracoronary NTG: 200 mcg   Normal Saline Bolus 250 mL PATIENT DISPOSITION:   The patient was transferred to the PACU holding area in a hemodynamicaly stable, chest pain free condition.   The patient tolerated the procedure well, and there were no complications. EBL: < 10 ml   The patient was stable before, during, and after the procedure. POST-OPERATIVE DIAGNOSIS:   Severe Single vessel CAD with 99% thrombotic sub-total occlusion of the distal RCA treated successfully with a Single VeriFlex BMS 5.0 mm x 15 mm - post-dilated to 5.6 mm.   Preserved LVEF with mild Distal to Apical Inferior Hypokinesis.   Diabetes on Metformin. PLAN OF CARE:   Admit to TCU with plan  for Fast Track Discharge on Day 3.   With Large BMS stent = DAPT x 1 month mandatory, but with ACS X 1 year recommended.   Will start low dose Carvedilol (had borderline bradycardic rhythm in Cath Lab)   Statin ordered   Will need Insulin coverage while holding Metformin   Has chronic pain - Oxycodone ordered.   Smoking cessation.       Assessment and Plan  1. CAD - no current symptoms - recent STEMI with BMS to RCA, plan for 1 year of DAPT, continue until 03/2015 -- start lisionpril 2.71m qday in setting of DM and known CAD, check BMET in 2 weeks.   2. Hyperlipidemia - reports myalgias on prior statins, tolerating pravastatin, will continue - elevated TGs, likely mainly related to uncontrolled DM, he has been on fibrate. Muscle aches on gemfibrozil, tolerating fenofibrate better.   3. Tobacco abuse - counseled on cessation, encouraged to use nicotine patches  4. DM type II - followed by Dr GHilma Favors  - defer management to primary  F/u 6 months    JArnoldo Lenis M.D.

## 2014-05-27 NOTE — Patient Instructions (Addendum)
   Begin Lisinopril 2.5mg  daily - new 30 day sent to pharm Continue all other medications.   Refills sent to pharm on Carvedilol, Fenofibrate, & Pravastatin for 90 day supply Lab for BMET - due in 2 weeks, around 06/10/2014.   Office will contact with results via phone or letter.   Your physician wants you to follow up in: 6 months.  You will receive a reminder letter in the mail one-two months in advance.  If you don't receive a letter, please call our office to schedule the follow up appointment   [name] ASSIST PHONE NUMBER GIVEN TODAY TO SEE IF HE QUALIFIES FOR ANY CO-PAY ASSISTANCE ($50.00/MO).

## 2014-06-19 ENCOUNTER — Encounter (HOSPITAL_COMMUNITY): Payer: Self-pay | Admitting: Cardiology

## 2014-07-25 ENCOUNTER — Encounter: Payer: Self-pay | Admitting: *Deleted

## 2014-09-29 ENCOUNTER — Ambulatory Visit (HOSPITAL_COMMUNITY)
Admission: RE | Admit: 2014-09-29 | Discharge: 2014-09-29 | Disposition: A | Discharge status: Home / Self Care | Payer: PRIVATE HEALTH INSURANCE | Source: Ambulatory Visit | Attending: Family Medicine | Admitting: Family Medicine

## 2014-09-29 ENCOUNTER — Other Ambulatory Visit (HOSPITAL_COMMUNITY): Payer: Self-pay | Admitting: Family Medicine

## 2014-09-29 DIAGNOSIS — X58XXXA Exposure to other specified factors, initial encounter: Secondary | ICD-10-CM | POA: Diagnosis not present

## 2014-09-29 DIAGNOSIS — S63602A Unspecified sprain of left thumb, initial encounter: Secondary | ICD-10-CM

## 2014-09-29 DIAGNOSIS — T1490 Injury, unspecified: Secondary | ICD-10-CM | POA: Insufficient documentation

## 2014-09-29 DIAGNOSIS — M25442 Effusion, left hand: Secondary | ICD-10-CM | POA: Insufficient documentation

## 2014-09-29 DIAGNOSIS — M79645 Pain in left finger(s): Secondary | ICD-10-CM | POA: Insufficient documentation

## 2014-12-04 ENCOUNTER — Encounter: Payer: PRIVATE HEALTH INSURANCE | Admitting: Cardiology

## 2014-12-04 NOTE — Progress Notes (Signed)
ERROR

## 2015-01-06 ENCOUNTER — Encounter: Payer: Self-pay | Admitting: *Deleted

## 2015-02-09 ENCOUNTER — Encounter: Payer: Self-pay | Admitting: Cardiology

## 2015-02-17 ENCOUNTER — Encounter: Payer: Self-pay | Admitting: Cardiology

## 2015-02-17 ENCOUNTER — Ambulatory Visit (INDEPENDENT_AMBULATORY_CARE_PROVIDER_SITE_OTHER): Payer: PRIVATE HEALTH INSURANCE | Admitting: Cardiology

## 2015-02-17 ENCOUNTER — Other Ambulatory Visit: Payer: Self-pay | Admitting: *Deleted

## 2015-02-17 VITALS — BP 114/73 | HR 68 | Ht 73.0 in | Wt 227.0 lb

## 2015-02-17 DIAGNOSIS — E785 Hyperlipidemia, unspecified: Secondary | ICD-10-CM

## 2015-02-17 DIAGNOSIS — I2510 Atherosclerotic heart disease of native coronary artery without angina pectoris: Secondary | ICD-10-CM | POA: Diagnosis not present

## 2015-02-17 DIAGNOSIS — Z9861 Coronary angioplasty status: Principal | ICD-10-CM

## 2015-02-17 DIAGNOSIS — Z720 Tobacco use: Secondary | ICD-10-CM

## 2015-02-17 MED ORDER — PRASUGREL HCL 10 MG PO TABS: 10.0000 mg | ORAL_TABLET | Freq: Every day | ORAL | Status: DC

## 2015-02-17 MED ORDER — PRAVASTATIN SODIUM 40 MG PO TABS: 40.0000 mg | ORAL_TABLET | Freq: Every day | ORAL | Status: DC

## 2015-02-17 MED ORDER — LISINOPRIL 2.5 MG PO TABS: 2.5000 mg | ORAL_TABLET | Freq: Every day | ORAL | Status: DC

## 2015-02-17 MED ORDER — CARVEDILOL 3.125 MG PO TABS: 3.1250 mg | ORAL_TABLET | Freq: Two times a day (BID) | ORAL | Status: DC

## 2015-02-17 NOTE — Progress Notes (Signed)
Patient ID: [name], male   DOB: 07/05/1966, 49 y.o.   MRN: [id number]     Clinical Summary [name] is a 49 y.o.male seen today for follow up of the following medical problems.   1. CAD - admit 03/2014 with inferior STEMI, received BMS to RCA. LVEF 45-50% by LV gram. -denies any chest pain, no SOB or DOE.  - mixed compliance with meds, ran out a few months ago of all his meds and did not refill - no chest pain, no SOB, no DOE.   2. Hyperlipidemiaa - gemfibrozil caused muscle aches, last visit changed to fenofibrate which he is tolerating better.  - tolerates pravastatin, reports prior myaglias on a prior statins  3. Tobacco abuse - still smoking, not interested in quitting   Past Medical History  Diagnosis Date  . Hypercholesteremia   . Anxiety   . Bulging disc   . Borderline diabetes   . Pancreatitis   . Diabetes mellitus without complication   . Chronic back pain      No Known Allergies   Current Outpatient Prescriptions  Medication Sig Dispense Refill  . ALPRAZolam (XANAX) 1 MG tablet Take 1 mg by mouth 3 (three) times daily as needed for anxiety.     Marland Kitchen aspirin 81 MG chewable tablet Chew 1 tablet (81 mg total) by mouth daily.    . carvedilol (COREG) 3.125 MG tablet Take 1 tablet (3.125 mg total) by mouth 2 (two) times daily with a meal. 180 tablet 3  . fenofibrate (TRICOR) 145 MG tablet Take 1 tablet (145 mg total) by mouth daily. 90 tablet 3  . lisinopril (PRINIVIL,ZESTRIL) 2.5 MG tablet Take 1 tablet (2.5 mg total) by mouth daily. 30 tablet 6  . metFORMIN (GLUCOPHAGE) 500 MG tablet Take 1 tablet (500 mg total) by mouth 2 (two) times daily with a meal. 60 tablet 11  . nicotine (NICODERM CQ - DOSED IN MG/24 HOURS) 14 mg/24hr patch Place 14 mg onto the skin as needed.    . nitroGLYCERIN (NITROSTAT) 0.4 MG SL tablet Place 1 tablet (0.4 mg total) under the tongue every 5 (five) minutes as needed for chest pain. 25 tablet 3  . oxyCODONE-acetaminophen  (PERCOCET/ROXICET) 5-325 MG per tablet Take 1 tablet by mouth every 4 (four) hours as needed for moderate pain or severe pain. 15 tablet 0  . prasugrel (EFFIENT) 10 MG TABS tablet Take 1 tablet (10 mg total) by mouth daily. 7 tablet 0  . pravastatin (PRAVACHOL) 40 MG tablet Take 1 tablet (40 mg total) by mouth daily. 90 tablet 3   No current facility-administered medications for this visit.     Past Surgical History  Procedure Laterality Date  . Appendectomy    . Abdominal surgery    . Back surgery    . Cardiac catheterization  2003    "trivial coronary artery disease," normal EF  . Left heart catheterization with coronary angiogram N/A 03/13/2014    Procedure: LEFT HEART CATHETERIZATION WITH CORONARY ANGIOGRAM;  Surgeon: Leonie Man, MD;  Location: [facility] CATH LAB;  Service: Cardiovascular;  Laterality: N/A;     No Known Allergies    Family History  Problem Relation Age of Onset  . Diabetes Father   . Hyperlipidemia Father   . Hypertension Father   . Hypertension Mother   . Hyperlipidemia Mother   . Hyperlipidemia Sister   . Hypertension Sister   . Diabetes Sister      Social History [name] reports that  he has been smoking Cigarettes.  He started smoking about 37 years ago. He has a 30 pack-year smoking history. He has never used smokeless tobacco. [name] reports that he drinks about 3.6 oz of alcohol per week.   Review of Systems CONSTITUTIONAL: No weight loss, fever, chills, weakness or fatigue.  HEENT: Eyes: No visual loss, blurred vision, double vision or yellow sclerae.No hearing loss, sneezing, congestion, runny nose or sore throat.  SKIN: No rash or itching.  CARDIOVASCULAR: per HPI RESPIRATORY: No shortness of breath, cough or sputum.  GASTROINTESTINAL: No anorexia, nausea, vomiting or diarrhea. No abdominal pain or blood.  GENITOURINARY: No burning on urination, no polyuria NEUROLOGICAL: No headache, dizziness, syncope, paralysis, ataxia, numbness or  tingling in the extremities. No change in bowel or bladder control.  MUSCULOSKELETAL: No muscle, back pain, joint pain or stiffness.  LYMPHATICS: No enlarged nodes. No history of splenectomy.  PSYCHIATRIC: No history of depression or anxiety.  ENDOCRINOLOGIC: No reports of sweating, cold or heat intolerance. No polyuria or polydipsia.  Marland Kitchen   Physical Examination Filed Vitals:   02/17/15 1531  BP: 114/73  Pulse: 68   Filed Vitals:   02/17/15 1531  Height: 6' 1" (1.854 m)  Weight: 227 lb (102.967 kg)    Gen: resting comfortably, no acute distress HEENT: no scleral icterus, pupils equal round and reactive, no palptable cervical adenopathy,  CV: RRR, no m/r/g, no JVD Resp: Clear to auscultation bilaterally GI: abdomen is soft, non-tender, non-distended, normal bowel sounds, no hepatosplenomegaly MSK: extremities are warm, no edema.  Skin: warm, no rash Neuro:  no focal deficits Psych: appropriate affect   Diagnostic Studies 03/14/14 Cath FINDINGS:  Hemodynamics:   Central Aortic Pressure / Mean: 109/67/87 mmHg   Left Ventricular Pressure / LVEDP: 106/16/26 mmHg Left Ventriculography:   EF: 45-50%   Wall Motion: Distal-apical inferior Coronary Anatomy:   Dominance: Right  Left Main: Normal caliber vessel that bifurcates into the LAD & Circumflex. Angiographically normal. LAD: Normal caliber vessel with a ~20 % tubular stenosis between 1st & 2nd Diagonal branches (D1 and D2). The vessel tapers towards, but does not reach the apex.   D1: Moderate caliber vessel. Proximal. Angiographically normal.   D2: Moderate to Large caliber vessel from the mid LAD. Angiographically normal Left Circumflex: Normal caliber vessel that bifurcates into a lateral Obtuse Marginal (OM1) and Left Posterolateral 1 (LPL1) after giving off a small caliber distal AV groove branch.   OM1: Moderate caliber. Angiographically normal.   LPL1: Small to moderate caliber. Angiographically  normal  RCA: Very large caliber, dominant vessel that bifurcates distally into the Right Posterior Descending Artery (RPDA) and the Right Posterior AV Groove Branch (RPAV). The vessel is essentially normal with the exception of a focal 99% thrombotic, sub-total occlusion in the distal vessel just beyond the crux.   RPDA: Large caliber, tortuous vessel. Reaches the inferoapex and wraps to the anteroapex. Angiographically normal   RPL Sysytem:The RPAV Large caliber vessel that essentially terminates as a large/extensive Right Posterolateral Branch (RPL). Angiographically normal. After reviewing the initial angiography, the culprit lesion was thought to be 99% subtotal occlusion of the distal RCA. Preparation were made to proceed with PCI on this lesion.  Percutaneous Coronary Intervention:  Guide: 6 Fr JR4 Guidewire: ProWater  Lesion: 99% thrombotic subtotal occlusion of distal RCA --> reduced to 0%  TIMI 2 flow pre-PCI --> TIMI 3 Flow post-PCI.  Predilation Balloon: Emerge 3.0 mm x 12 mm; Balloon was occlusive across the lesion. 2332 hrs  8 Atm x 20 Sec  Stent: BS VeriFlex BMS 5.0 mm x 15 mm;   12 Atm x 30 Sec, 16 Atm x 30 Sec   Diameter ~5.5 mm  Post-dilation Balloon: [place] Emerge 5.5 mm x 12 mm;   16 Atm x 30 Sec   Final Diameter: 5.6 mm  Post deployment angiography in multiple views, with and without guidewire in place revealed excellent stent deployment and lesion coverage. There was no evidence of dissection or perforation. MEDICATIONS:   Anesthesia: Local Lidocaine 2 ml  Sedation: 1 mg IV Versed, 50 mcg IV fentanyl ;   Premedication: 4000 Units IV Heparin, 324 mg ASA, Dilaudid 2 mg IV, NTG SL x 2  Omnipaque Contrast: 160 ml   Anticoagulation: Angiomax Bolus & drip   Anti-Platelet Agent: Effient 60 mg PO  Radial Cocktail: 5 mg Verapamil, 400 mcg NTG, 2 ml 2% Lidocaine in 10 ml NS   Intracoronary NTG: 200 mcg   Normal Saline Bolus 250 mL PATIENT  DISPOSITION:   The patient was transferred to the PACU holding area in a hemodynamicaly stable, chest pain free condition.   The patient tolerated the procedure well, and there were no complications. EBL: < 10 ml   The patient was stable before, during, and after the procedure. POST-OPERATIVE DIAGNOSIS:   Severe Single vessel CAD with 99% thrombotic sub-total occlusion of the distal RCA treated successfully with a Single VeriFlex BMS 5.0 mm x 15 mm - post-dilated to 5.6 mm.   Preserved LVEF with mild Distal to Apical Inferior Hypokinesis.   Diabetes on Metformin. PLAN OF CARE:   Admit to TCU with plan for Fast Track Discharge on Day 3.   With Large BMS stent = DAPT x 1 month mandatory, but with ACS X 1 year recommended.   Will start low dose Carvedilol (had borderline bradycardic rhythm in Cath Lab)   Statin ordered   Will need Insulin coverage while holding Metformin   Has chronic pain - Oxycodone ordered.   Smoking cessation.    Assessment and Plan  1. CAD - no current symptoms - history of STEMI with BMS to RCA, plan for 1 year of DAPT, continue until 03/2015 - counseled on importance of medication compliance  2. Hyperlipidemia - reports myalgias on prior statins, tolerating pravastatin, will continue - elevated TGs, reports medication costs are expensive for him. Stop fenofibrate, elevated TGs likely primarily due to uncontrolled DM2 and evidence does not support improved outcomes with combined fibrate and statin therapy.   3. Tobacco abuse - counseled on cessation, encouraged to use nicotine patches  4. DM type II - followed by Dr Hilma Favors,  - defer management to primary      Arnoldo Lenis, M.D.

## 2015-02-17 NOTE — Patient Instructions (Signed)
Your physician wants you to follow-up in: [place] DR. BRANCH You will receive a reminder letter in the mail two months in advance. If you don't receive a letter, please call our office to schedule the follow-up appointment.  Your physician has recommended you make the following change in your medication:   STOP EFFIENT AFTER 1 MONTH WE HAVE SENT A 30 DAY SUPPLY TO YOU PHARMACY  STOP FENOFIBRATE   WE HAVE REFILLED YOUR CARVEDILOL/LISINOPRIL AND PRAVASTATIN   Your physician recommends that you return for lab work BMP/MAG  Thank you for choosing [facility]!!

## 2015-03-02 ENCOUNTER — Encounter: Payer: Self-pay | Admitting: *Deleted

## 2015-04-01 ENCOUNTER — Telehealth: Payer: Self-pay

## 2015-04-01 NOTE — Telephone Encounter (Signed)
Gastroenterology Pre-Procedure Review  Request Date: 04/01/2015 Requesting Physician: Dr. Hilma Favors  PATIENT REVIEW QUESTIONS: The patient responded to the following health history questions as indicated:    Pt had back surgery and a stent last year He was taken off of Effient about a month ago  1. Diabetes Melitis: BORDERLINE 2. Joint replacements in the past 12 months: no 3. Major health problems in the past 3 months: no 4. Has an artificial valve or MVP: no 5. Has a defibrillator: no 6. Has been advised in past to take antibiotics in advance of a procedure like teeth cleaning: no    MEDICATIONS & ALLERGIES:    Patient reports the following regarding taking any blood thinners:   Plavix? no Aspirin? YES Coumadin? no  Patient confirms/reports the following medications:  Current Outpatient Prescriptions  Medication Sig Dispense Refill  . ALPRAZolam (XANAX) 1 MG tablet Take 1 mg by mouth 3 (three) times daily as needed for anxiety.     Marland Kitchen aspirin 81 MG chewable tablet Chew 1 tablet (81 mg total) by mouth daily.    . carvedilol (COREG) 3.125 MG tablet Take 1 tablet (3.125 mg total) by mouth 2 (two) times daily with a meal. 180 tablet 3  . cyclobenzaprine (FLEXERIL) 10 MG tablet Take 1 tablet by mouth 3 (three) times daily as needed.  2  . lisinopril (PRINIVIL,ZESTRIL) 2.5 MG tablet Take 1 tablet (2.5 mg total) by mouth daily. 90 tablet 3  . oxyCODONE-acetaminophen (PERCOCET/ROXICET) 5-325 MG per tablet Take 1 tablet by mouth every 4 (four) hours as needed for moderate pain or severe pain. 15 tablet 0  . pravastatin (PRAVACHOL) 40 MG tablet Take 1 tablet (40 mg total) by mouth daily. 90 tablet 3  . nicotine (NICODERM CQ - DOSED IN MG/24 HOURS) 14 mg/24hr patch Place 14 mg onto the skin as needed.    . nitroGLYCERIN (NITROSTAT) 0.4 MG SL tablet Place 1 tablet (0.4 mg total) under the tongue every 5 (five) minutes as needed for chest pain. (Patient not taking: Reported on 04/01/2015) 25 tablet  3  . prasugrel (EFFIENT) 10 MG TABS tablet Take 1 tablet (10 mg total) by mouth daily. (Patient not taking: Reported on 02/17/2015) 7 tablet 0  . prasugrel (EFFIENT) 10 MG TABS tablet Take 1 tablet (10 mg total) by mouth daily. (Patient not taking: Reported on 04/01/2015) 30 tablet 0   No current facility-administered medications for this visit.    Patient confirms/reports the following allergies:  No Known Allergies  No orders of the defined types were placed in this encounter.    AUTHORIZATION INFORMATION Primary Insurance:   ID #:   Group #:  Pre-Cert / Auth required:  Pre-Cert / Auth #:   Secondary Insurance:   ID #:   Group #:  Pre-Cert / Auth required: Pre-Cert / Auth #:   SCHEDULE INFORMATION: Procedure has been scheduled as follows:  Date:         Time:   Location:   This Gastroenterology Pre-Precedure Review Form is being routed to the following Gerrad Welker(s): R. Garfield Cornea, MD

## 2015-04-03 NOTE — Telephone Encounter (Signed)
Appropriate, as he is OFF EFFIENT.

## 2015-04-08 NOTE — Telephone Encounter (Signed)
LMOM to call and schedule the colonoscopy.

## 2015-04-10 ENCOUNTER — Telehealth: Payer: Self-pay

## 2015-04-10 NOTE — Telephone Encounter (Signed)
Call pt Mon.

## 2015-04-10 NOTE — Telephone Encounter (Signed)
[place]

## 2015-04-10 NOTE — Telephone Encounter (Signed)
LMOM for a return call.  

## 2015-04-13 ENCOUNTER — Other Ambulatory Visit: Payer: Self-pay

## 2015-04-13 DIAGNOSIS — Z1211 Encounter for screening for malignant neoplasm of colon: Secondary | ICD-10-CM

## 2015-04-13 MED ORDER — PEG 3350-KCL-NA BICARB-NACL 420 G PO SOLR: 4000.0000 mL | ORAL | Status: DC

## 2015-04-13 NOTE — Telephone Encounter (Signed)
Pt is scheduled for 04/16/2015 at 12:45 pm. PT's wife aware. Rx sent to pharmacy and instructions faxed to pharmacy. Pt's wife aware she needs to pick up instructions today, since he will start clear liquids tomorrow.

## 2015-04-14 ENCOUNTER — Telehealth: Payer: Self-pay

## 2015-04-14 NOTE — Telephone Encounter (Signed)
LMOM for a return call in reference to his insurance. I tried to do online PA and his number was not found.  I called [employer] at [phone] and spoke to Amy who said the pt's insurance changed the first of OCT. She thinks it might be changed to [facility], she is not sure I will have to find out from the pt.

## 2015-04-14 NOTE — Telephone Encounter (Signed)
I called again and LMOM to please call, I need correct insurance info for the procedure that scheduled for 04/16/2015.

## 2015-04-14 NOTE — Telephone Encounter (Signed)
Pt returned call and gave me the new info . [employer].

## 2015-04-14 NOTE — Telephone Encounter (Signed)
I called [employer] @ [phone] and spoke to Cornish who said a PA is not required for a screening colonoscopy as outpatient.

## 2015-04-16 ENCOUNTER — Ambulatory Visit (HOSPITAL_COMMUNITY)
Admission: RE | Admit: 2015-04-16 | Discharge: 2015-04-16 | Disposition: A | Discharge status: Home / Self Care | Payer: BLUE CROSS/BLUE SHIELD | Source: Ambulatory Visit | Attending: Internal Medicine | Admitting: Internal Medicine

## 2015-04-16 ENCOUNTER — Telehealth: Payer: Self-pay | Admitting: Internal Medicine

## 2015-04-16 ENCOUNTER — Encounter (HOSPITAL_COMMUNITY)
Admission: RE | Disposition: A | Discharge status: Home / Self Care | Payer: Self-pay | Source: Ambulatory Visit | Attending: Internal Medicine

## 2015-04-16 ENCOUNTER — Encounter (HOSPITAL_COMMUNITY): Payer: Self-pay

## 2015-04-16 DIAGNOSIS — Z7982 Long term (current) use of aspirin: Secondary | ICD-10-CM | POA: Diagnosis not present

## 2015-04-16 DIAGNOSIS — D124 Benign neoplasm of descending colon: Secondary | ICD-10-CM | POA: Insufficient documentation

## 2015-04-16 DIAGNOSIS — D125 Benign neoplasm of sigmoid colon: Secondary | ICD-10-CM | POA: Insufficient documentation

## 2015-04-16 DIAGNOSIS — Z79899 Other long term (current) drug therapy: Secondary | ICD-10-CM | POA: Insufficient documentation

## 2015-04-16 DIAGNOSIS — Z86010 Personal history of colonic polyps: Secondary | ICD-10-CM | POA: Insufficient documentation

## 2015-04-16 DIAGNOSIS — E119 Type 2 diabetes mellitus without complications: Secondary | ICD-10-CM | POA: Diagnosis not present

## 2015-04-16 DIAGNOSIS — F419 Anxiety disorder, unspecified: Secondary | ICD-10-CM | POA: Insufficient documentation

## 2015-04-16 DIAGNOSIS — Z87891 Personal history of nicotine dependence: Secondary | ICD-10-CM | POA: Diagnosis not present

## 2015-04-16 DIAGNOSIS — E7800 Pure hypercholesterolemia, unspecified: Secondary | ICD-10-CM | POA: Insufficient documentation

## 2015-04-16 DIAGNOSIS — K5730 Diverticulosis of large intestine without perforation or abscess without bleeding: Secondary | ICD-10-CM | POA: Insufficient documentation

## 2015-04-16 DIAGNOSIS — Z1211 Encounter for screening for malignant neoplasm of colon: Secondary | ICD-10-CM

## 2015-04-16 HISTORY — PX: COLONOSCOPY: SHX5424

## 2015-04-16 LAB — GLUCOSE, CAPILLARY: Glucose-Capillary: 102 mg/dL — ABNORMAL HIGH (ref 65–99)

## 2015-04-16 SURGERY — COLONOSCOPY
Anesthesia: Moderate Sedation

## 2015-04-16 MED ORDER — ONDANSETRON HCL 4 MG/2ML IJ SOLN
INTRAMUSCULAR | Status: AC
  Filled 2015-04-16: qty 2

## 2015-04-16 MED ORDER — MIDAZOLAM HCL 5 MG/5ML IJ SOLN
INTRAMUSCULAR | Status: DC | PRN
  Administered 2015-04-16: 2 mg via INTRAVENOUS
  Administered 2015-04-16: 1 mg via INTRAVENOUS
  Administered 2015-04-16: 2 mg via INTRAVENOUS

## 2015-04-16 MED ORDER — SODIUM CHLORIDE 0.9 % IV SOLN
INTRAVENOUS | Status: DC
  Administered 2015-04-16: 1000 mL via INTRAVENOUS

## 2015-04-16 MED ORDER — ONDANSETRON HCL 4 MG/2ML IJ SOLN
INTRAMUSCULAR | Status: DC | PRN
  Administered 2015-04-16: 4 mg via INTRAVENOUS

## 2015-04-16 MED ORDER — SIMETHICONE 40 MG/0.6ML PO SUSP
ORAL | Status: DC | PRN
Start: 2015-04-16 — End: 2015-04-16
  Administered 2015-04-16: 13:00:00

## 2015-04-16 MED ORDER — PROMETHAZINE HCL 25 MG/ML IJ SOLN
INTRAMUSCULAR | Status: AC
  Administered 2015-04-16: 25 mg via INTRAVENOUS
  Filled 2015-04-16: qty 1

## 2015-04-16 MED ORDER — PROMETHAZINE HCL 25 MG/ML IJ SOLN: 25.0000 mg | Freq: Once | INTRAMUSCULAR | Status: DC

## 2015-04-16 MED ORDER — MEPERIDINE HCL 100 MG/ML IJ SOLN
INTRAMUSCULAR | Status: DC | PRN
  Administered 2015-04-16: 25 mg via INTRAVENOUS
  Administered 2015-04-16: 50 mg via INTRAVENOUS

## 2015-04-16 MED ORDER — MEPERIDINE HCL 100 MG/ML IJ SOLN
INTRAMUSCULAR | Status: AC
  Filled 2015-04-16: qty 2

## 2015-04-16 MED ORDER — SODIUM CHLORIDE 0.9 % IJ SOLN
INTRAMUSCULAR | Status: AC
  Filled 2015-04-16: qty 3

## 2015-04-16 MED ORDER — MIDAZOLAM HCL 5 MG/5ML IJ SOLN
INTRAMUSCULAR | Status: AC
  Filled 2015-04-16: qty 10

## 2015-04-16 NOTE — Discharge Instructions (Signed)
Colonoscopy Discharge Instructions  Read the instructions outlined below and refer to this sheet in the next few weeks. These discharge instructions provide you with general information on caring for yourself after you leave the hospital. Your doctor may also give you specific instructions. While your treatment has been planned according to the most current medical practices available, unavoidable complications occasionally occur. If you have any problems or questions after discharge, call Dr. Gala Romney at [phone]. ACTIVITY  You may resume your regular activity, but move at a slower pace for the next 24 hours.   Take frequent rest periods for the next 24 hours.   Walking will help get rid of the air and reduce the bloated feeling in your belly (abdomen).   No driving for 24 hours (because of the medicine (anesthesia) used during the test).    Do not sign any important legal documents or operate any machinery for 24 hours (because of the anesthesia used during the test).  NUTRITION  Drink plenty of fluids.   You may resume your normal diet as instructed by your doctor.   Begin with a light meal and progress to your normal diet. Heavy or fried foods are harder to digest and may make you feel sick to your stomach (nauseated).   Avoid alcoholic beverages for 24 hours or as instructed.  MEDICATIONS  You may resume your normal medications unless your doctor tells you otherwise.  WHAT YOU CAN EXPECT TODAY  Some feelings of bloating in the abdomen.   Passage of more gas than usual.   Spotting of blood in your stool or on the toilet paper.  IF YOU HAD POLYPS REMOVED DURING THE COLONOSCOPY:  No aspirin products for 7 days or as instructed.   No alcohol for 7 days or as instructed.   Eat a soft diet for the next 24 hours.  FINDING OUT THE RESULTS OF YOUR TEST Not all test results are available during your visit. If your test results are not back during the visit, make an appointment  with your caregiver to find out the results. Do not assume everything is normal if you have not heard from your caregiver or the medical facility. It is important for you to follow up on all of your test results.  SEEK IMMEDIATE MEDICAL ATTENTION IF:  You have more than a spotting of blood in your stool.   Your belly is swollen (abdominal distention).   You are nauseated or vomiting.   You have a temperature over 101.   You have abdominal pain or discomfort that is severe or gets worse throughout the day.    Colon polyp and diverticulosis information provided   Start Benefiber 2 teaspoons twice daily  Use MiraLAX 17 g orally bedtime nightly for occasional constipation  Further recommendations to follow pending review of pathology report    Colon Polyps Polyps are lumps of extra tissue growing inside the body. Polyps can grow in the large intestine (colon). Most colon polyps are noncancerous (benign). However, some colon polyps can become cancerous over time. Polyps that are larger than a pea may be harmful. To be safe, caregivers remove and test all polyps. CAUSES  Polyps form when mutations in the genes cause your cells to grow and divide even though no more tissue is needed. RISK FACTORS There are a number of risk factors that can increase your chances of getting colon polyps. They include:  Being older than 50 years.  Family history of colon polyps or colon cancer.  Long-term colon diseases, such as colitis or Crohn disease.  Being overweight.  Smoking.  Being inactive.  Drinking too much alcohol. SYMPTOMS  Most small polyps do not cause symptoms. If symptoms are present, they may include:  Blood in the stool. The stool may look dark red or black.  Constipation or diarrhea that lasts longer than 1 week. DIAGNOSIS People often do not know they have polyps until their caregiver finds them during a regular checkup. Your caregiver can use 4 tests to check for  polyps:  Digital rectal exam. The caregiver wears gloves and feels inside the rectum. This test would find polyps only in the rectum.  Barium enema. The caregiver puts a liquid called barium into your rectum before taking X-rays of your colon. Barium makes your colon look white. Polyps are dark, so they are easy to see in the X-ray pictures.  Sigmoidoscopy. A thin, flexible tube (sigmoidoscope) is placed into your rectum. The sigmoidoscope has a light and tiny camera in it. The caregiver uses the sigmoidoscope to look at the last third of your colon.  Colonoscopy. This test is like sigmoidoscopy, but the caregiver looks at the entire colon. This is the most common method for finding and removing polyps. TREATMENT  Any polyps will be removed during a sigmoidoscopy or colonoscopy. The polyps are then tested for cancer. PREVENTION  To help lower your risk of getting more colon polyps:  Eat plenty of fruits and vegetables. Avoid eating fatty foods.  Do not smoke.  Avoid drinking alcohol.  Exercise every day.  Lose weight if recommended by your caregiver.  Eat plenty of calcium and folate. Foods that are rich in calcium include milk, cheese, and broccoli. Foods that are rich in folate include chickpeas, kidney beans, and spinach. HOME CARE INSTRUCTIONS Keep all follow-up appointments as directed by your caregiver. You may need periodic exams to check for polyps. SEEK MEDICAL CARE IF: You notice bleeding during a bowel movement.   This information is not intended to replace advice given to you by your health care provider. Make sure you discuss any questions you have with your health care provider.   Document Released: 03/23/2004 Document Revised: 07/18/2014 Document Reviewed: 09/06/2011 Elsevier Interactive Patient Education 2016 [employer].  Diverticulosis Diverticulosis is the condition that develops when small pouches (diverticula) form in the wall of your colon. Your colon, or  large intestine, is where water is absorbed and stool is formed. The pouches form when the inside layer of your colon pushes through weak spots in the outer layers of your colon. CAUSES  No one knows exactly what causes diverticulosis. RISK FACTORS  Being older than 41. Your risk for this condition increases with age. Diverticulosis is rare in people younger than 40 years. By age 59, almost everyone has it.  Eating a low-fiber diet.  Being frequently constipated.  Being overweight.  Not getting enough exercise.  Smoking.  Taking over-the-counter pain medicines, like aspirin and ibuprofen. SYMPTOMS  Most people with diverticulosis do not have symptoms. DIAGNOSIS  Because diverticulosis often has no symptoms, health care providers often discover the condition during an exam for other colon problems. In many cases, a health care provider will diagnose diverticulosis while using a flexible scope to examine the colon (colonoscopy). TREATMENT  If you have never developed an infection related to diverticulosis, you may not need treatment. If you have had an infection before, treatment may include:  Eating more fruits, vegetables, and grains.  Taking a fiber supplement.  Taking a live bacteria supplement (probiotic).  Taking medicine to relax your colon. HOME CARE INSTRUCTIONS   Drink at least 6-8 glasses of water each day to prevent constipation.  Try not to strain when you have a bowel movement.  Keep all follow-up appointments. If you have had an infection before:  Increase the fiber in your diet as directed by your health care provider or dietitian.  Take a dietary fiber supplement if your health care provider approves.  Only take medicines as directed by your health care provider. SEEK MEDICAL CARE IF:   You have abdominal pain.  You have bloating.  You have cramps.  You have not gone to the bathroom in 3 days. SEEK IMMEDIATE MEDICAL CARE IF:   Your pain gets  worse.  Yourbloating becomes very bad.  You have a fever or chills, and your symptoms suddenly get worse.  You begin vomiting.  You have bowel movements that are bloody or black. MAKE SURE YOU:  Understand these instructions.  Will watch your condition.  Will get help right away if you are not doing well or get worse.   This information is not intended to replace advice given to you by your health care provider. Make sure you discuss any questions you have with your health care provider.   Document Released: 03/24/2004 Document Revised: 07/02/2013 Document Reviewed: 05/22/2013 Elsevier Interactive Patient Education [employer].

## 2015-04-16 NOTE — Op Note (Signed)
[place], [id number]   COLONOSCOPY PROCEDURE REPORT  PATIENT: Thomas, Mccann  MR#: [id number] BIRTHDATE: [birth year] , 49  yrs. old GENDER: male ENDOSCOPIST: R.  Garfield Cornea, MD FACP [facility] REFERRED TW:[facility] Hilma Favors, M.D. PROCEDURE DATE:  05-12-15 PROCEDURE:   Colonoscopy with snare polypectomy INDICATIONS:First-ever average risk colorectal cancer screening examination. MEDICATIONS: Versed 5 mg IV and Demerol 75 mg IV in divided doses. Phenergan 25 mg IV.  Zofran 4 mg IV. ASA CLASS:       Class II  CONSENT: The risks, benefits, alternatives and imponderables including but not limited to bleeding, perforation as well as the possibility of a missed lesion have been reviewed.  The potential for biopsy, lesion removal, etc. have also been discussed. Questions have been answered.  All parties agreeable.  Please see the history and physical in the medical record for more information.  DESCRIPTION OF PROCEDURE:   After the risks benefits and alternatives of the procedure were thoroughly explained, informed consent was obtained.  The digital rectal exam revealed no abnormalities of the rectum.   The EC-[id number] ([id number])  endoscope was introduced through the anus and advanced to the cecum, which was identified by both the appendix and ileocecal valve. No adverse events experienced.   The quality of the prep was adequate  The instrument was then slowly withdrawn as the colon was fully examined. Estimated blood loss is zero unless otherwise noted in this procedure report.      COLON FINDINGS: Normal-appearing rectal mucosa.  Seen well on?"face. Small rectal vault unable to retroflex.  Scattered left-sided diverticula; (2) pedunculated polyps in the mid sigmoid segment (1 cm and 6 mm, respectively.  There was (1) 6 mm polyp in the mid descending segment; otherwise, the remainder of the colonic mucosa appeared normal.  The descending colon  polyp was cold snare removed; the sigmoid polyps were hot snare removed.  Retroflexion was not performed. .  Withdrawal time=15 minutes 0 seconds.  The scope was withdrawn and the procedure completed. COMPLICATIONS: There were no immediate complications. EBL 3 mL ENDOSCOPIC IMPRESSION: Multiple colonic polyps?"removed as described above. Colonic diverticulosis.  RECOMMENDATIONS: Follow-up on pathology. Begin Benefiber 2 teaspoons twice daily. MiraLAX 17 g orally nightly for occasional constipation  eSigned:  R. Garfield Cornea, MD Rosalita Chessman [facility] May 12, 2015 1:38 PM   cc:  CPT CODES: ICD CODES:  The ICD and CPT codes recommended by this software are interpretations from the data that the clinical staff has captured with the software.  The verification of the translation of this report to the ICD and CPT codes and modifiers is the sole responsibility of the health care institution and practicing physician where this report was generated.  [employer] will not be held responsible for the validity of the ICD and CPT codes included on this report.  AMA assumes no liability for data contained or not contained herein. CPT is a Designer, television/film set of the [employer].  PATIENT NAME:  Thomas, Mccann MR#: [id number]

## 2015-04-16 NOTE — Progress Notes (Signed)
d 

## 2015-04-16 NOTE — Progress Notes (Signed)
Please excuse [name] from work on Friday October 7th, 2016.  He may return to work on Saturday October 8th, 2016.

## 2015-04-16 NOTE — H&P (Signed)
@LOGO @   Primary Care Physician:  [name], MD Primary Gastroenterologist:  Dr. Gala Romney  Pre-Procedure History & Physical: HPI:  Story D Economou is a 49 y.o. male is here for a screening colonoscopy. No bowel symptoms. No family history of colon cancer. No prior colonoscopy.  Past Medical History  Diagnosis Date  . Hypercholesteremia   . Anxiety   . Bulging disc   . Borderline diabetes   . Pancreatitis   . Diabetes mellitus without complication ([place])   . Chronic back pain     Past Surgical History  Procedure Laterality Date  . Appendectomy    . Abdominal surgery    . Back surgery    . Cardiac catheterization  2003    "trivial coronary artery disease," normal EF  . Left heart catheterization with coronary angiogram N/A 03/13/2014    Procedure: LEFT HEART CATHETERIZATION WITH CORONARY ANGIOGRAM;  Surgeon: Leonie Man, MD;  Location: [facility] C[facility] LAB;  Service: Cardiovascular;  Laterality: N/A;    Prior to Admission medications   Medication Sig Start Date End Date Taking? Authorizing Provider  ALPRAZolam Duanne Moron) 1 MG tablet Take 1 mg by mouth 3 (three) times daily as needed for anxiety.    Yes Historical Provider, MD  aspirin 81 MG chewable tablet Chew 1 tablet (81 mg total) by mouth daily. 03/15/14  Yes Eileen Stanford, PA-C  carvedilol (COREG) 3.125 MG tablet Take 1 tablet (3.125 mg total) by mouth 2 (two) times daily with a meal. 02/17/15  Yes Arnoldo Lenis, MD  cyclobenzaprine (FLEXERIL) 10 MG tablet Take 1 tablet by mouth 3 (three) times daily as needed. 01/06/15  Yes Historical Provider, MD  fenofibrate (TRICOR) 145 MG tablet TK 1 T PO QD 03/30/15  Yes Historical Provider, MD  ibuprofen (ADVIL,MOTRIN) 200 MG tablet Take 400 mg by mouth every 6 (six) hours as needed.   Yes Historical Provider, MD  lisinopril (PRINIVIL,ZESTRIL) 2.5 MG tablet Take 1 tablet (2.5 mg total) by mouth daily. 02/17/15  Yes Arnoldo Lenis, MD  oxyCODONE-acetaminophen (PERCOCET/ROXICET) 5-325  MG per tablet Take 1 tablet by mouth every 4 (four) hours as needed for moderate pain or severe pain. 01/21/14  Yes Costin Karlyne Greenspan, MD  polyethylene glycol-electrolytes (TRILYTE) 420 G solution Take 4,000 mLs by mouth as directed. 04/13/15  Yes Daneil Dolin, MD  pravastatin (PRAVACHOL) 40 MG tablet Take 1 tablet (40 mg total) by mouth daily. 02/17/15  Yes Arnoldo Lenis, MD  nicotine (NICODERM CQ - DOSED IN MG/24 HOURS) 14 mg/24hr patch Place 14 mg onto the skin as needed. 03/15/14   Eileen Stanford, PA-C  nitroGLYCERIN (NITROSTAT) 0.4 MG SL tablet Place 1 tablet (0.4 mg total) under the tongue every 5 (five) minutes as needed for chest pain. 03/28/14   Arnoldo Lenis, MD    Allergies as of 04/13/2015  . (No Known Allergies)    Family History  Problem Relation Age of Onset  . Diabetes Father   . Hyperlipidemia Father   . Hypertension Father   . Hypertension Mother   . Hyperlipidemia Mother   . Hyperlipidemia Sister   . Hypertension Sister   . Diabetes Sister     Social History   Social History  . Marital Status: Single    Spouse Name: N/A  . Number of Children: N/A  . Years of Education: N/A   Occupational History  . Not on file.   Social History Main Topics  . Smoking status: Current Every  Day Smoker -- 1.00 packs/day for 30 years    Types: Cigarettes    Start date: 10/25/1977  . Smokeless tobacco: Never Used     Comment: no longer using the Nicotine patch but is trying to cut back on number cigarettes smoked each day   . Alcohol Use: 3.6 oz/week    6 Cans of beer, 0 Standard drinks or equivalent per week  . Drug Use: No  . Sexual Activity: Yes   Other Topics Concern  . Not on file   Social History Narrative    Review of Systems: See HPI, otherwise negative ROS  Physical Exam: BP 114/75 mmHg  Pulse 51  Temp(Src) 98.2 F (36.8 C) (Oral)  Resp 12  Ht 6\' 1"  (1.854 m)  Wt 227 lb (102.967 kg)  BMI 29.96 kg/m2  SpO2 99% General:   Alert,   Well-developed, well-nourished, pleasant and cooperative in NAD Nose:  No deformity, discharge,  or lesions. Mouth:  No deformity or lesions, dentition normal. Neck:  Supple; no masses or thyromegaly. Lungs:  Clear throughout to auscultation.   No wheezes, crackles, or rhonchi. No acute distress. Heart:  Regular rate and rhythm; no murmurs, clicks, rubs,  or gallops. Abdomen:  Soft, nontender and nondistended. No masses, hepatosplenomegaly or hernias noted. Normal bowel sounds, without guarding, and without rebound.    Impression/Plan: Rubin D Mccarry is now here to undergo a screening colonoscopy. First ever average risk screening examination Risks, benefits, limitations, imponderables and alternatives regarding colonoscopy have been reviewed with the patient. Questions have been answered. All parties agreeable.     Notice:  This dictation was prepared with Dragon dictation along with smaller phrase technology. Any transcriptional errors that result from this process are unintentional and may not be corrected upon review.

## 2015-04-16 NOTE — Telephone Encounter (Signed)
error 

## 2015-04-20 ENCOUNTER — Encounter: Payer: Self-pay | Admitting: Internal Medicine

## 2015-04-23 ENCOUNTER — Encounter (HOSPITAL_COMMUNITY): Payer: Self-pay | Admitting: Internal Medicine

## 2015-06-24 ENCOUNTER — Other Ambulatory Visit: Payer: Self-pay | Admitting: Cardiology

## 2015-06-26 ENCOUNTER — Other Ambulatory Visit: Payer: Self-pay | Admitting: Cardiology

## 2015-08-17 ENCOUNTER — Encounter: Payer: Self-pay | Admitting: *Deleted

## 2015-09-04 ENCOUNTER — Encounter: Payer: Self-pay | Admitting: Cardiology

## 2015-09-04 ENCOUNTER — Ambulatory Visit (INDEPENDENT_AMBULATORY_CARE_PROVIDER_SITE_OTHER): Payer: BLUE CROSS/BLUE SHIELD | Admitting: Cardiology

## 2015-09-04 VITALS — BP 121/75 | HR 60 | Ht 73.0 in | Wt 225.8 lb

## 2015-09-04 DIAGNOSIS — E785 Hyperlipidemia, unspecified: Secondary | ICD-10-CM | POA: Diagnosis not present

## 2015-09-04 DIAGNOSIS — I2510 Atherosclerotic heart disease of native coronary artery without angina pectoris: Secondary | ICD-10-CM | POA: Diagnosis not present

## 2015-09-04 DIAGNOSIS — R079 Chest pain, unspecified: Secondary | ICD-10-CM | POA: Diagnosis not present

## 2015-09-04 MED ORDER — LISINOPRIL 2.5 MG PO TABS: 2.5000 mg | ORAL_TABLET | Freq: Every day | ORAL | Status: DC

## 2015-09-04 MED ORDER — CARVEDILOL 3.125 MG PO TABS: 3.1250 mg | ORAL_TABLET | Freq: Two times a day (BID) | ORAL | Status: DC

## 2015-09-04 MED ORDER — PRAVASTATIN SODIUM 40 MG PO TABS: 40.0000 mg | ORAL_TABLET | Freq: Every day | ORAL | Status: DC

## 2015-09-04 NOTE — Progress Notes (Signed)
Patient ID: [name], male   DOB: 1966-04-23, 50 y.o.   MRN: [id number]     Clinical Summary [name] is a 50 y.o.male seen today for follow up of the following medical problems.   1. CAD - admit 03/2014 with inferior STEMI, received BMS to RCA. LVEF 45-50% by LV gram. - reports left shoulder pain. Not sure what starts. Lasts about 45 minutes. No other associated symptoms. Typically comes on with activity. Better with ibuprofen. No midchest pain.  - compliant with meds, but ran out of lisionpril a few days ago  2. Hyperlipidemia - tolerates pravastatin, reports prior myaglias on a prior statins - most recent lipid panel shows elevated TGs, however he reports he drank coffee with sugar and whole milk prior to test.   3. Tobacco abuse - still smoking, not interested in quitting  4. Lightheaded - mainly happens with first standing - 1 cup of coffee in AM, pepsis or Dr [name] x 2 in afternoon, pepsi or Dr [name] x 2 at home  Past Medical History  Diagnosis Date  . Hypercholesteremia   . Anxiety   . Bulging disc   . Borderline diabetes   . Pancreatitis   . Diabetes mellitus without complication ([name])   . Chronic back pain      No Known Allergies   Current Outpatient Prescriptions  Medication Sig Dispense Refill  . ALPRAZolam (XANAX) 1 MG tablet Take 1 mg by mouth 3 (three) times daily as needed for anxiety.     Marland Kitchen aspirin 81 MG chewable tablet Chew 1 tablet (81 mg total) by mouth daily.    . carvedilol (COREG) 3.125 MG tablet Take 1 tablet (3.125 mg total) by mouth 2 (two) times daily with a meal. 180 tablet 3  . cyclobenzaprine (FLEXERIL) 10 MG tablet Take 1 tablet by mouth 3 (three) times daily as needed.  2  . fenofibrate (TRICOR) 145 MG tablet TK 1 T PO QD  3  . ibuprofen (ADVIL,MOTRIN) 200 MG tablet Take 400 mg by mouth every 6 (six) hours as needed.    Marland Kitchen lisinopril (PRINIVIL,ZESTRIL) 2.5 MG tablet Take 1 tablet (2.5 mg total) by mouth daily. 90 tablet 3  . nicotine  (NICODERM CQ - DOSED IN MG/24 HOURS) 14 mg/24hr patch Place 14 mg onto the skin as needed.    . nitroGLYCERIN (NITROSTAT) 0.4 MG SL tablet Place 1 tablet (0.4 mg total) under the tongue every 5 (five) minutes as needed for chest pain. 25 tablet 3  . oxyCODONE-acetaminophen (PERCOCET/ROXICET) 5-325 MG per tablet Take 1 tablet by mouth every 4 (four) hours as needed for moderate pain or severe pain. 15 tablet 0  . polyethylene glycol-electrolytes (TRILYTE) 420 G solution Take 4,000 mLs by mouth as directed. 4000 mL 0  . pravastatin (PRAVACHOL) 40 MG tablet Take 1 tablet (40 mg total) by mouth daily. 90 tablet 3   No current facility-administered medications for this visit.     Past Surgical History  Procedure Laterality Date  . Appendectomy    . Abdominal surgery    . Back surgery    . Cardiac catheterization  2003    "trivial coronary artery disease," normal EF  . Left heart catheterization with coronary angiogram N/A 03/13/2014    Procedure: LEFT HEART CATHETERIZATION WITH CORONARY ANGIOGRAM;  Surgeon: Leonie Man, MD;  [facility] C[facility] LAB;  Service: Cardiovascular;  Laterality: N/A;  . Colonoscopy N/A 04/16/2015    Procedure: COLONOSCOPY;  Surgeon: Daneil Dolin, MD;  Location: [facility];  Service: Endoscopy;  Laterality: N/A;  12:45 PM     No Known Allergies    Family History  Problem Relation Age of Onset  . Diabetes Father   . Hyperlipidemia Father   . Hypertension Father   . Hypertension Mother   . Hyperlipidemia Mother   . Hyperlipidemia Sister   . Hypertension Sister   . Diabetes Sister      Social History [name] reports that he has been smoking Cigarettes.  He started smoking about 37 years ago. He has a 30 pack-year smoking history. He has never used smokeless tobacco. [name] reports that he drinks about 3.6 oz of alcohol per week.   Review of Systems CONSTITUTIONAL: No weight loss, fever, chills, weakness or fatigue.  HEENT: Eyes: No visual loss,  blurred vision, double vision or yellow sclerae.No hearing loss, sneezing, congestion, runny nose or sore throat.  SKIN: No rash or itching.  CARDIOVASCULAR: per HPI RESPIRATORY: No shortness of breath, cough or sputum.  GASTROINTESTINAL: No anorexia, nausea, vomiting or diarrhea. No abdominal pain or blood.  GENITOURINARY: No burning on urination, no polyuria NEUROLOGICAL: dizziness with standing MUSCULOSKELETAL: No muscle, back pain, joint pain or stiffness.  LYMPHATICS: No enlarged nodes. No history of splenectomy.  PSYCHIATRIC: No history of depression or anxiety.  ENDOCRINOLOGIC: No reports of sweating, cold or heat intolerance. No polyuria or polydipsia.  Marland Kitchen   Physical Examination Filed Vitals:   09/04/15 1521  BP: 121/75  Pulse: 60   Filed Vitals:   09/04/15 1521  Height: _0  (1.854 m)  Weight: 225 lb 12.8 oz (102.422 kg)    Gen: resting comfortably, no acute distress HEENT: no scleral icterus, pupils equal round and reactive, no palptable cervical adenopathy,  CV: RRR, no m/r/g, no jvd Resp: Clear to auscultation bilaterally GI: abdomen is soft, non-tender, non-distended, normal bowel sounds, no hepatosplenomegaly MSK: extremities are warm, no edema.  Skin: warm, no rash Neuro:  no focal deficits Psych: appropriate affect   Diagnostic Studies 03/14/14 Cath FINDINGS:  Hemodynamics:   Central Aortic Pressure / Mean: 109/67/87 mmHg   Left Ventricular Pressure / LVEDP: 106/16/26 mmHg Left Ventriculography:   EF: 45-50%   Wall Motion: Distal-apical inferior Coronary Anatomy:   Dominance: Right  Left Main: Normal caliber vessel that bifurcates into the LAD & Circumflex. Angiographically normal. LAD: Normal caliber vessel with a ~20 % tubular stenosis between 1st & 2nd Diagonal branches (D1 and D2). The vessel tapers towards, but does not reach the apex.   D1: Moderate caliber vessel. Proximal. Angiographically normal.   D2: Moderate to Large caliber  vessel from the mid LAD. Angiographically normal Left Circumflex: Normal caliber vessel that bifurcates into a lateral Obtuse Marginal (OM1) and Left Posterolateral 1 (LPL1) after giving off a small caliber distal AV groove branch.   OM1: Moderate caliber. Angiographically normal.   LPL1: Small to moderate caliber. Angiographically normal  RCA: Very large caliber, dominant vessel that bifurcates distally into the Right Posterior Descending Artery (RPDA) and the Right Posterior AV Groove Branch (RPAV). The vessel is essentially normal with the exception of a focal 99% thrombotic, sub-total occlusion in the distal vessel just beyond the crux.   RPDA: Large caliber, tortuous vessel. Reaches the inferoapex and wraps to the anteroapex. Angiographically normal   RPL Sysytem:The RPAV Large caliber vessel that essentially terminates as a large/extensive Right Posterolateral Branch (RPL). Angiographically normal. After reviewing the initial angiography, the culprit lesion was thought to be 99% subtotal  occlusion of the distal RCA. Preparation were made to proceed with PCI on this lesion.  Percutaneous Coronary Intervention:  Guide: 6 Fr JR4 Guidewire: ProWater  Lesion: 99% thrombotic subtotal occlusion of distal RCA --> reduced to 0%  TIMI 2 flow pre-PCI --> TIMI 3 Flow post-PCI.  Predilation Balloon: Emerge 3.0 mm x 12 mm; Balloon was occlusive across the lesion. 2332 hrs   8 Atm x 20 Sec  Stent: BS VeriFlex BMS 5.0 mm x 15 mm;   12 Atm x 30 Sec, 16 Atm x 30 Sec   Diameter ~5.5 mm  Post-dilation Balloon: [place] Emerge 5.5 mm x 12 mm;   16 Atm x 30 Sec   Final Diameter: 5.6 mm  Post deployment angiography in multiple views, with and without guidewire in place revealed excellent stent deployment and lesion coverage. There was no evidence of dissection or perforation. MEDICATIONS:   Anesthesia: Local Lidocaine 2 ml  Sedation: 1 mg IV Versed, 50 mcg IV fentanyl ;    Premedication: 4000 Units IV Heparin, 324 mg ASA, Dilaudid 2 mg IV, NTG SL x 2  Omnipaque Contrast: 160 ml   Anticoagulation: Angiomax Bolus & drip   Anti-Platelet Agent: Effient 60 mg PO  Radial Cocktail: 5 mg Verapamil, 400 mcg NTG, 2 ml 2% Lidocaine in 10 ml NS   Intracoronary NTG: 200 mcg   Normal Saline Bolus 250 mL PATIENT DISPOSITION:   The patient was transferred to the PACU holding area in a hemodynamicaly stable, chest pain free condition.   The patient tolerated the procedure well, and there were no complications. EBL: < 10 ml   The patient was stable before, during, and after the procedure. POST-OPERATIVE DIAGNOSIS:   Severe Single vessel CAD with 99% thrombotic sub-total occlusion of the distal RCA treated successfully with a Single VeriFlex BMS 5.0 mm x 15 mm - post-dilated to 5.6 mm.   Preserved LVEF with mild Distal to Apical Inferior Hypokinesis.   Diabetes on Metformin. PLAN OF CARE:   Admit to TCU with plan for Fast Track Discharge on Day 3.   With Large BMS stent = DAPT x 1 month mandatory, but with ACS X 1 year recommended.   Will start low dose Carvedilol (had borderline bradycardic rhythm in Cath Lab)   Statin ordered   Will need Insulin coverage while holding Metformin   Has chronic pain - Oxycodone ordered.   Smoking cessation.      09/04/15 Clinic EKG: NSR Assessment and Plan   1. CAD - atypical MSK shoulder pain, no cardiac chest pain. EKG without ichemic changes in clinic today.  - ccntinue current meds  2. Hyperlipidemia - reports myalgias on prior statins, tolerating pravastatin, will continue - elevated TGs however not true fasting panel. Counseled on true fasting lipid panel requirements, we will repeat test.   3. Tobacco abuse - counseled on cessation, he is not interested in quitting.   4. DM type II - followed by pcp      Arnoldo Lenis, M.D.

## 2015-09-04 NOTE — Patient Instructions (Signed)
Your physician recommends that you schedule a follow-up appointment in: 4 months with Dr. Harl Bowie  Your physician recommends that you continue on your current medications as directed. Please refer to the Current Medication list given to you today.  WE HAVE REFILLED YOUR MEDICATIONS  Your physician recommends that you return for lab work LIPIDS - PLEASE FAST PRIOR TO LAB WORK   Thank you for choosing [facility]!!

## 2015-10-23 ENCOUNTER — Encounter: Payer: Self-pay | Admitting: *Deleted

## 2016-01-06 ENCOUNTER — Other Ambulatory Visit: Payer: Self-pay | Admitting: Cardiology

## 2016-01-06 MED ORDER — CARVEDILOL 3.125 MG PO TABS: 3.1250 mg | ORAL_TABLET | Freq: Two times a day (BID) | ORAL | Status: DC

## 2016-01-06 MED ORDER — LISINOPRIL 2.5 MG PO TABS: 2.5000 mg | ORAL_TABLET | Freq: Every day | ORAL | Status: DC

## 2016-01-06 NOTE — Telephone Encounter (Signed)
1. Which medications need to be refilled? (please list name of each medication and dose if known)   1. lisinopril (PRINIVIL,ZESTRIL) 2.5 MG tablet [id number]   2. carvedilol (COREG) 3.125 MG tablet [id number]   2. Which pharmacy/location (including street and city if local pharmacy) is medication to be sent to? [facility] in [place]   3. Do they need a 30 day or 90 day supply?

## 2016-01-06 NOTE — Telephone Encounter (Signed)
Medication sent to pharmacy  

## 2016-01-07 ENCOUNTER — Ambulatory Visit: Payer: BLUE CROSS/BLUE SHIELD | Admitting: Cardiology

## 2016-01-20 ENCOUNTER — Ambulatory Visit: Payer: BLUE CROSS/BLUE SHIELD | Admitting: Cardiology

## 2016-01-20 DIAGNOSIS — R0989 Other specified symptoms and signs involving the circulatory and respiratory systems: Secondary | ICD-10-CM

## 2016-01-20 NOTE — Progress Notes (Unsigned)
Clinical Summary [name] is a 50 y.o.male seen today for follow up of the following medical problems.   1. CAD - admit 03/2014 with inferior STEMI, received BMS to RCA. LVEF 45-50% by LV gram. - reports left shoulder pain. Not sure what starts. Lasts about 45 minutes. No other associated symptoms. Typically comes on with activity. Better with ibuprofen. No midchest pain.  - compliant with meds, but ran out of lisionpril a few days ago  2. Hyperlipidemia - tolerates pravastatin, reports prior myaglias on a prior statins - most recent lipid panel shows elevated TGs, however he reports he drank coffee with sugar and whole milk prior to test.   3. Tobacco abuse - still smoking, not interested in quitting  4. Lightheaded - mainly happens with first standing - 1 cup of coffee in AM, pepsis or Dr [name] x 2 in afternoon, pepsi or Dr [name] x 2 at home    Past Medical History  Diagnosis Date  . Hypercholesteremia   . Anxiety   . Bulging disc   . Borderline diabetes   . Pancreatitis   . Diabetes mellitus without complication ([place])   . Chronic back pain      No Known Allergies   Current Outpatient Prescriptions  Medication Sig Dispense Refill  . ALPRAZolam (XANAX) 1 MG tablet Take 1 mg by mouth 3 (three) times daily as needed for anxiety.     Marland Kitchen aspirin 81 MG chewable tablet Chew 1 tablet (81 mg total) by mouth daily.    . carvedilol (COREG) 3.125 MG tablet Take 1 tablet (3.125 mg total) by mouth 2 (two) times daily with a meal. 180 tablet 3  . ibuprofen (ADVIL,MOTRIN) 200 MG tablet Take 400 mg by mouth every 6 (six) hours as needed.    Marland Kitchen lisinopril (PRINIVIL,ZESTRIL) 2.5 MG tablet Take 1 tablet (2.5 mg total) by mouth daily. 90 tablet 3  . nitroGLYCERIN (NITROSTAT) 0.4 MG SL tablet Place 1 tablet (0.4 mg total) under the tongue every 5 (five) minutes as needed for chest pain. 25 tablet 3  . oxyCODONE-acetaminophen (PERCOCET/ROXICET) 5-325 MG per tablet Take 1 tablet by mouth  every 4 (four) hours as needed for moderate pain or severe pain. 15 tablet 0  . pravastatin (PRAVACHOL) 40 MG tablet Take 1 tablet (40 mg total) by mouth daily. 90 tablet 3   No current facility-administered medications for this visit.     Past Surgical History  Procedure Laterality Date  . Appendectomy    . Abdominal surgery    . Back surgery    . Cardiac catheterization  2003    "trivial coronary artery disease," normal EF  . Left heart catheterization with coronary angiogram N/A 03/13/2014    Procedure: LEFT HEART CATHETERIZATION WITH CORONARY ANGIOGRAM;  Surgeon: Leonie Man, MD;  Location: [facility] CATH LAB;  Service: Cardiovascular;  Laterality: N/A;  . Colonoscopy N/A 04/16/2015    Procedure: COLONOSCOPY;  Surgeon: Daneil Dolin, MD;  Location: [facility]O SU[facility];  Service: Endoscopy;  Laterality: N/A;  12:45 PM     No Known Allergies    Family History  Problem Relation Age of Onset  . Diabetes Father   . Hyperlipidemia Father   . Hypertension Father   . Hypertension Mother   . Hyperlipidemia Mother   . Hyperlipidemia Sister   . Hypertension Sister   . Diabetes Sister      Social History [name] reports that he has been smoking Cigarettes.  He started  smoking about 38 years ago. He has a 30 pack-year smoking history. He has never used smokeless tobacco. [name] reports that he drinks about 3.6 oz of alcohol per week.   Review of Systems CONSTITUTIONAL: No weight loss, fever, chills, weakness or fatigue.  HEENT: Eyes: No visual loss, blurred vision, double vision or yellow sclerae.No hearing loss, sneezing, congestion, runny nose or sore throat.  SKIN: No rash or itching.  CARDIOVASCULAR:  RESPIRATORY: No shortness of breath, cough or sputum.  GASTROINTESTINAL: No anorexia, nausea, vomiting or diarrhea. No abdominal pain or blood.  GENITOURINARY: No burning on urination, no polyuria NEUROLOGICAL: No headache, dizziness, syncope, paralysis, ataxia, numbness or tingling  in the extremities. No change in bowel or bladder control.  MUSCULOSKELETAL: No muscle, back pain, joint pain or stiffness.  LYMPHATICS: No enlarged nodes. No history of splenectomy.  PSYCHIATRIC: No history of depression or anxiety.  ENDOCRINOLOGIC: No reports of sweating, cold or heat intolerance. No polyuria or polydipsia.  Marland Kitchen   Physical Examination There were no vitals filed for this visit. There were no vitals filed for this visit.  Gen: resting comfortably, no acute distress HEENT: no scleral icterus, pupils equal round and reactive, no palptable cervical adenopathy,  CV Resp: Clear to auscultation bilaterally GI: abdomen is soft, non-tender, non-distended, normal bowel sounds, no hepatosplenomegaly MSK: extremities are warm, no edema.  Skin: warm, no rash Neuro:  no focal deficits Psych: appropriate affect   Diagnostic Studies 03/14/14 Cath FINDINGS:  Hemodynamics:   Central Aortic Pressure / Mean: 109/67/87 mmHg   Left Ventricular Pressure / LVEDP: 106/16/26 mmHg Left Ventriculography:   EF: 45-50%   Wall Motion: Distal-apical inferior Coronary Anatomy:   Dominance: Right  Left Main: Normal caliber vessel that bifurcates into the LAD & Circumflex. Angiographically normal. LAD: Normal caliber vessel with a ~20 % tubular stenosis between 1st & 2nd Diagonal branches (D1 and D2). The vessel tapers towards, but does not reach the apex.   D1: Moderate caliber vessel. Proximal. Angiographically normal.   D2: Moderate to Large caliber vessel from the mid LAD. Angiographically normal Left Circumflex: Normal caliber vessel that bifurcates into a lateral Obtuse Marginal (OM1) and Left Posterolateral 1 (LPL1) after giving off a small caliber distal AV groove [name].   OM1: Moderate caliber. Angiographically normal.   LPL1: Small to moderate caliber. Angiographically normal  RCA: Very large caliber, dominant vessel that bifurcates distally into the Right  Posterior Descending Artery (RPDA) and the Right Posterior AV Groove [name] (RPAV). The vessel is essentially normal with the exception of a focal 99% thrombotic, sub-total occlusion in the distal vessel just beyond the crux.   RPDA: Large caliber, tortuous vessel. Reaches the inferoapex and wraps to the anteroapex. Angiographically normal   RPL Sysytem:The RPAV Large caliber vessel that essentially terminates as a large/extensive Right Posterolateral Aubreyanna Dorrough (RPL). Angiographically normal. After reviewing the initial angiography, the culprit lesion was thought to be 99% subtotal occlusion of the distal RCA. Preparation were made to proceed with PCI on this lesion.  Percutaneous Coronary Intervention:  Guide: 6 Fr JR4 Guidewire: ProWater  Lesion: 99% thrombotic subtotal occlusion of distal RCA --> reduced to 0%  TIMI 2 flow pre-PCI --> TIMI 3 Flow post-PCI.  Predilation Balloon: Emerge 3.0 mm x 12 mm; Balloon was occlusive across the lesion. 2332 hrs   8 Atm x 20 Sec  Stent: BS VeriFlex BMS 5.0 mm x 15 mm;   12 Atm x 30 Sec, 16 Atm x 30 Sec   Diameter ~  5.5 mm  Post-dilation Balloon: [place] Emerge 5.5 mm x 12 mm;   16 Atm x 30 Sec   Final Diameter: 5.6 mm  Post deployment angiography in multiple views, with and without guidewire in place revealed excellent stent deployment and lesion coverage. There was no evidence of dissection or perforation. MEDICATIONS:   Anesthesia: Local Lidocaine 2 ml  Sedation: 1 mg IV Versed, 50 mcg IV fentanyl ;   Premedication: 4000 Units IV Heparin, 324 mg ASA, Dilaudid 2 mg IV, NTG SL x 2  Omnipaque Contrast: 160 ml   Anticoagulation: Angiomax Bolus & drip   Anti-Platelet Agent: Effient 60 mg PO  Radial Cocktail: 5 mg Verapamil, 400 mcg NTG, 2 ml 2% Lidocaine in 10 ml NS   Intracoronary NTG: 200 mcg   Normal Saline Bolus 250 mL PATIENT DISPOSITION:   The patient was transferred to the PACU holding area in a hemodynamicaly  stable, chest pain free condition.   The patient tolerated the procedure well, and there were no complications. EBL: < 10 ml   The patient was stable before, during, and after the procedure. POST-OPERATIVE DIAGNOSIS:   Severe Single vessel CAD with 99% thrombotic sub-total occlusion of the distal RCA treated successfully with a Single VeriFlex BMS 5.0 mm x 15 mm - post-dilated to 5.6 mm.   Preserved LVEF with mild Distal to Apical Inferior Hypokinesis.   Diabetes on Metformin. PLAN OF CARE:   Admit to TCU with plan for Fast Track Discharge on Day 3.   With Large BMS stent = DAPT x 1 month mandatory, but with ACS X 1 year recommended.   Will start low dose Carvedilol (had borderline bradycardic rhythm in Cath Lab)   Statin ordered   Will need Insulin coverage while holding Metformin   Has chronic pain - Oxycodone ordered.   Smoking cessation.      09/04/15 Clinic EKG: NSR    Assessment and Plan  1. CAD - atypical MSK shoulder pain, no cardiac chest pain. EKG without ichemic changes in clinic today.  - ccntinue current meds  2. Hyperlipidemia - reports myalgias on prior statins, tolerating pravastatin, will continue - elevated TGs however not true fasting panel. Counseled on true fasting lipid panel requirements, we will repeat test.   3. Tobacco abuse - counseled on cessation, he is not interested in quitting.   4. DM type II - followed by pcp      Arnoldo Lenis, M.D., F.A.C.C.

## 2016-03-03 ENCOUNTER — Other Ambulatory Visit: Payer: Self-pay | Admitting: Cardiology

## 2016-06-14 ENCOUNTER — Other Ambulatory Visit: Payer: Self-pay | Admitting: Cardiology

## 2016-10-20 ENCOUNTER — Other Ambulatory Visit (HOSPITAL_COMMUNITY): Payer: Self-pay | Admitting: Family Medicine

## 2016-10-20 DIAGNOSIS — M5136 Other intervertebral disc degeneration, lumbar region: Secondary | ICD-10-CM

## 2016-10-20 DIAGNOSIS — M545 Low back pain: Secondary | ICD-10-CM

## 2016-10-20 DIAGNOSIS — M5410 Radiculopathy, site unspecified: Secondary | ICD-10-CM

## 2016-10-25 ENCOUNTER — Ambulatory Visit (HOSPITAL_COMMUNITY)
Admission: RE | Admit: 2016-10-25 | Discharge: 2016-10-25 | Disposition: A | Discharge status: Home / Self Care | Payer: BLUE CROSS/BLUE SHIELD | Source: Ambulatory Visit | Attending: Family Medicine | Admitting: Family Medicine

## 2016-10-25 DIAGNOSIS — M5136 Other intervertebral disc degeneration, lumbar region: Secondary | ICD-10-CM | POA: Diagnosis not present

## 2016-10-25 DIAGNOSIS — M545 Low back pain: Secondary | ICD-10-CM

## 2016-10-25 DIAGNOSIS — M5126 Other intervertebral disc displacement, lumbar region: Secondary | ICD-10-CM | POA: Insufficient documentation

## 2016-10-25 DIAGNOSIS — M2578 Osteophyte, vertebrae: Secondary | ICD-10-CM | POA: Insufficient documentation

## 2016-10-25 DIAGNOSIS — Z981 Arthrodesis status: Secondary | ICD-10-CM | POA: Insufficient documentation

## 2016-10-25 DIAGNOSIS — M5410 Radiculopathy, site unspecified: Secondary | ICD-10-CM

## 2016-11-08 ENCOUNTER — Other Ambulatory Visit: Payer: Self-pay | Admitting: Neurosurgery

## 2016-11-08 DIAGNOSIS — M5126 Other intervertebral disc displacement, lumbar region: Secondary | ICD-10-CM

## 2016-11-14 ENCOUNTER — Other Ambulatory Visit: Payer: BLUE CROSS/BLUE SHIELD

## 2016-11-16 ENCOUNTER — Other Ambulatory Visit: Payer: BLUE CROSS/BLUE SHIELD

## 2016-11-17 ENCOUNTER — Ambulatory Visit
Admission: RE | Admit: 2016-11-17 | Discharge: 2016-11-17 | Disposition: A | Discharge status: Home / Self Care | Payer: BLUE CROSS/BLUE SHIELD | Source: Ambulatory Visit | Attending: Neurosurgery | Admitting: Neurosurgery

## 2016-11-17 DIAGNOSIS — M5126 Other intervertebral disc displacement, lumbar region: Secondary | ICD-10-CM

## 2016-11-17 MED ORDER — METHYLPREDNISOLONE ACETATE 40 MG/ML INJ SUSP (RADIOLOG
120.0000 mg | Freq: Once | INTRAMUSCULAR | Status: AC
  Administered 2016-11-17: 120 mg via EPIDURAL

## 2016-11-17 MED ORDER — IOPAMIDOL (ISOVUE-M 200) INJECTION 41%
1.0000 mL | Freq: Once | INTRAMUSCULAR | Status: AC
  Administered 2016-11-17: 1 mL via EPIDURAL

## 2016-11-17 NOTE — Discharge Instructions (Signed)

## 2016-12-11 ENCOUNTER — Other Ambulatory Visit: Payer: Self-pay | Admitting: Cardiology

## 2017-01-16 ENCOUNTER — Other Ambulatory Visit: Payer: Self-pay | Admitting: Cardiology

## 2017-01-23 ENCOUNTER — Other Ambulatory Visit: Payer: Self-pay | Admitting: Cardiology

## 2017-01-23 MED ORDER — CARVEDILOL 3.125 MG PO TABS: 3.1250 mg | ORAL_TABLET | Freq: Two times a day (BID) | ORAL | 1 refills | Status: DC

## 2017-01-23 MED ORDER — LISINOPRIL 2.5 MG PO TABS: 2.5000 mg | ORAL_TABLET | Freq: Every day | ORAL | 1 refills | Status: DC

## 2017-01-23 NOTE — Telephone Encounter (Signed)
Medication sent to pharmacy  

## 2017-01-23 NOTE — Telephone Encounter (Signed)
[name] needs to have his blood pressure medication called in to last until his appointment with Dr. Harl Bowie. [place], [place]

## 2017-03-02 ENCOUNTER — Encounter: Payer: Self-pay | Admitting: Cardiology

## 2017-03-02 ENCOUNTER — Ambulatory Visit (INDEPENDENT_AMBULATORY_CARE_PROVIDER_SITE_OTHER): Payer: BLUE CROSS/BLUE SHIELD | Admitting: Cardiology

## 2017-03-02 ENCOUNTER — Ambulatory Visit (HOSPITAL_COMMUNITY): Payer: BLUE CROSS/BLUE SHIELD | Attending: Neurosurgery | Admitting: Physical Therapy

## 2017-03-02 ENCOUNTER — Telehealth: Payer: Self-pay | Admitting: Cardiology

## 2017-03-02 ENCOUNTER — Encounter (HOSPITAL_COMMUNITY): Payer: Self-pay | Admitting: Physical Therapy

## 2017-03-02 VITALS — BP 118/86 | HR 64 | Ht 73.0 in | Wt 224.8 lb

## 2017-03-02 DIAGNOSIS — M545 Low back pain: Secondary | ICD-10-CM | POA: Insufficient documentation

## 2017-03-02 DIAGNOSIS — M62830 Muscle spasm of back: Secondary | ICD-10-CM | POA: Insufficient documentation

## 2017-03-02 DIAGNOSIS — R079 Chest pain, unspecified: Secondary | ICD-10-CM | POA: Diagnosis not present

## 2017-03-02 DIAGNOSIS — I2510 Atherosclerotic heart disease of native coronary artery without angina pectoris: Secondary | ICD-10-CM

## 2017-03-02 DIAGNOSIS — M6281 Muscle weakness (generalized): Secondary | ICD-10-CM | POA: Insufficient documentation

## 2017-03-02 DIAGNOSIS — G8929 Other chronic pain: Secondary | ICD-10-CM | POA: Insufficient documentation

## 2017-03-02 DIAGNOSIS — R2689 Other abnormalities of gait and mobility: Secondary | ICD-10-CM

## 2017-03-02 DIAGNOSIS — E782 Mixed hyperlipidemia: Secondary | ICD-10-CM

## 2017-03-02 MED ORDER — NITROGLYCERIN 0.4 MG SL SUBL: 0.4000 mg | SUBLINGUAL_TABLET | SUBLINGUAL | 3 refills | Status: DC | PRN

## 2017-03-02 NOTE — Progress Notes (Signed)
Clinical Summary [name] is a 51 y.o.male seen today for follow up of the following medical problems.   1. CAD - admit 03/2014 with inferior STEMI, received BMS to RCA. LVEF 45-50% by LV gram. - reports left shoulder pain. Not sure what starts. Lasts about 45 minutes. No other associated symptoms. Typically comes on with activity. Better with ibuprofen. No midchest pain.  - compliant with meds, but ran out of lisionpril a few days ago  - fluttering in chest midchest, racing feeling. Lasts about 5 minutes. Can have some left shoulder pain, left neck pain. Fluttering sporadic - limited caffeine. No EtOH. Episodes can occur at rest or with activity. Cramping like pain.  - can have some DOE at times - symptoms started about 2 months ago. Stable severity, stable frequency.  - better with NG  2. Hyperlipidemia - tolerates pravastatin, reports prior myaglias on a prior statins - most recent lipid panel shows elevated TGs, however he reports he drank coffee with sugar and whole milk prior to test.   - remains compliant with statin  3. Chronic back pain - recent surgery - awaiting disabilty.   Past Medical History:  Diagnosis Date  . Anxiety   . Borderline diabetes   . Bulging disc   . Chronic back pain   . Diabetes mellitus without complication ([place])   . Hypercholesteremia   . Pancreatitis      No Known Allergies   Current Outpatient Prescriptions  Medication Sig Dispense Refill  . ALPRAZolam (XANAX) 1 MG tablet Take 1 mg by mouth 3 (three) times daily as needed for anxiety.     Marland Kitchen aspirin 81 MG chewable tablet Chew 1 tablet (81 mg total) by mouth daily.    . carvedilol (COREG) 3.125 MG tablet TAKE 1 TABLET(3.125 MG) BY MOUTH TWICE DAILY WITH A MEAL 60 tablet 0  . carvedilol (COREG) 3.125 MG tablet Take 1 tablet (3.125 mg total) by mouth 2 (two) times daily with a meal. 60 tablet 1  . ibuprofen (ADVIL,MOTRIN) 200 MG tablet Take 400 mg by mouth every 6 (six) hours as  needed.    Marland Kitchen lisinopril (PRINIVIL,ZESTRIL) 2.5 MG tablet Take 1 tablet (2.5 mg total) by mouth daily. 30 tablet 1  . nitroGLYCERIN (NITROSTAT) 0.4 MG SL tablet Place 1 tablet (0.4 mg total) under the tongue every 5 (five) minutes as needed for chest pain. 25 tablet 3  . oxyCODONE-acetaminophen (PERCOCET/ROXICET) 5-325 MG per tablet Take 1 tablet by mouth every 4 (four) hours as needed for moderate pain or severe pain. 15 tablet 0  . pravastatin (PRAVACHOL) 40 MG tablet Take 1 tablet (40 mg total) by mouth daily. 90 tablet 3   No current facility-administered medications for this visit.      Past Surgical History:  Procedure Laterality Date  . ABDOMINAL SURGERY    . APPENDECTOMY    . BACK SURGERY    . CARDIAC CATHETERIZATION  2003   "trivial coronary artery disease," normal EF  . COLONOSCOPY N/A 04/16/2015   Procedure: COLONOSCOPY;  Surgeon: Daneil Dolin, MD;  Location: [facility];  Service: Endoscopy;  Laterality: N/A;  12:45 PM  . LEFT HEART CATHETERIZATION WITH CORONARY ANGIOGRAM N/A 03/13/2014   Procedure: LEFT HEART CATHETERIZATION WITH CORONARY ANGIOGRAM;  Surgeon: Leonie Man, MD;  Location: [facility] CATH LAB;  Service: Cardiovascular;  Laterality: N/A;     No Known Allergies    Family History  Problem Relation Age of Onset  . Diabetes  Father   . Hyperlipidemia Father   . Hypertension Father   . Hypertension Mother   . Hyperlipidemia Mother   . Hyperlipidemia Sister   . Hypertension Sister   . Diabetes Sister      Social History Mr. Parekh reports that he has been smoking Cigarettes.  He started smoking about 39 years ago. He has a 30.00 pack-year smoking history. He has never used smokeless tobacco. Mr. Cihlar reports that he drinks about 3.6 oz of alcohol per week .   Review of Systems CONSTITUTIONAL: No weight loss, fever, chills, weakness or fatigue.  HEENT: Eyes: No visual loss, blurred vision, double vision or yellow sclerae.No hearing loss, sneezing, congestion,  runny nose or sore throat.  SKIN: No rash or itching.  CARDIOVASCULAR: per hpi RESPIRATORY: per hpi GASTROINTESTINAL: No anorexia, nausea, vomiting or diarrhea. No abdominal pain or blood.  GENITOURINARY: No burning on urination, no polyuria NEUROLOGICAL: No headache, dizziness, syncope, paralysis, ataxia, numbness or tingling in the extremities. No change in bowel or bladder control.  MUSCULOSKELETAL: No muscle, back pain, joint pain or stiffness.  LYMPHATICS: No enlarged nodes. No history of splenectomy.  PSYCHIATRIC: No history of depression or anxiety.  ENDOCRINOLOGIC: No reports of sweating, cold or heat intolerance. No polyuria or polydipsia.  Marland Kitchen   Physical Examination Vitals:   03/02/17 1328  BP: 118/86  Pulse: 64  SpO2: 95%   Vitals:   03/02/17 1328  Weight: 224 lb 12.8 oz (102 kg)  Height: _0  (1.854 m)    Gen: resting comfortably, no acute distress HEENT: no scleral icterus, pupils equal round and reactive, no palptable cervical adenopathy,  CV: RRR, no mr/g, no jvd Resp: Clear to auscultation bilaterally GI: abdomen is soft, non-tender, non-distended, normal bowel sounds, no hepatosplenomegaly MSK: extremities are warm, no edema.  Skin: warm, no rash Neuro:  no focal deficits Psych: appropriate affect   Diagnostic Studies  03/14/14 Cath FINDINGS:  Hemodynamics:   Central Aortic Pressure / Mean: 109/67/87 mmHg   Left Ventricular Pressure / LVEDP: 106/16/26 mmHg Left Ventriculography:   EF: 45-50%   Wall Motion: Distal-apical inferior Coronary Anatomy:   Dominance: Right  Left Main: Normal caliber vessel that bifurcates into the LAD & Circumflex. Angiographically normal. LAD: Normal caliber vessel with a ~20 % tubular stenosis between 1st & 2nd Diagonal branches (D1 and D2). The vessel tapers towards, but does not reach the apex.   D1: Moderate caliber vessel. Proximal. Angiographically normal.   D2: Moderate to Large caliber vessel from  the mid LAD. Angiographically normal Left Circumflex: Normal caliber vessel that bifurcates into a lateral Obtuse Marginal (OM1) and Left Posterolateral 1 (LPL1) after giving off a small caliber distal AV groove [name].   OM1: Moderate caliber. Angiographically normal.   LPL1: Small to moderate caliber. Angiographically normal  RCA: Very large caliber, dominant vessel that bifurcates distally into the Right Posterior Descending Artery (RPDA) and the Right Posterior AV Groove [name] (RPAV). The vessel is essentially normal with the exception of a focal 99% thrombotic, sub-total occlusion in the distal vessel just beyond the crux.   RPDA: Large caliber, tortuous vessel. Reaches the inferoapex and wraps to the anteroapex. Angiographically normal   RPL Sysytem:The RPAV Large caliber vessel that essentially terminates as a large/extensive Right Posterolateral [name] (RPL). Angiographically normal. After reviewing the initial angiography, the culprit lesion was thought to be 99% subtotal occlusion of the distal RCA. Preparation were made to proceed with PCI on this lesion.  Percutaneous Coronary Intervention:  Guide: 6 Fr JR4 Guidewire: ProWater  Lesion: 99% thrombotic subtotal occlusion of distal RCA --> reduced to 0%  TIMI 2 flow pre-PCI --> TIMI 3 Flow post-PCI.  Predilation Balloon: Emerge 3.0 mm x 12 mm; Balloon was occlusive across the lesion. 2332 hrs   8 Atm x 20 Sec  Stent: BS VeriFlex BMS 5.0 mm x 15 mm;   12 Atm x 30 Sec, 16 Atm x 30 Sec   Diameter ~5.5 mm  Post-dilation Balloon: [place] Emerge 5.5 mm x 12 mm;   16 Atm x 30 Sec   Final Diameter: 5.6 mm  Post deployment angiography in multiple views, with and without guidewire in place revealed excellent stent deployment and lesion coverage. There was no evidence of dissection or perforation. MEDICATIONS:   Anesthesia: Local Lidocaine 2 ml  Sedation: 1 mg IV Versed, 50 mcg IV fentanyl ;   Premedication: 4000  Units IV Heparin, 324 mg ASA, Dilaudid 2 mg IV, NTG SL x 2  Omnipaque Contrast: 160 ml   Anticoagulation: Angiomax Bolus & drip   Anti-Platelet Agent: Effient 60 mg PO  Radial Cocktail: 5 mg Verapamil, 400 mcg NTG, 2 ml 2% Lidocaine in 10 ml NS   Intracoronary NTG: 200 mcg   Normal Saline Bolus 250 mL PATIENT DISPOSITION:   The patient was transferred to the PACU holding area in a hemodynamicaly stable, chest pain free condition.   The patient tolerated the procedure well, and there were no complications. EBL: < 10 ml   The patient was stable before, during, and after the procedure. POST-OPERATIVE DIAGNOSIS:   Severe Single vessel CAD with 99% thrombotic sub-total occlusion of the distal RCA treated successfully with a Single VeriFlex BMS 5.0 mm x 15 mm - post-dilated to 5.6 mm.   Preserved LVEF with mild Distal to Apical Inferior Hypokinesis.   Diabetes on Metformin. PLAN OF CARE:   Admit to TCU with plan for Fast Track Discharge on Day 3.   With Large BMS stent = DAPT x 1 month mandatory, but with ACS X 1 year recommended.   Will start low dose Carvedilol (had borderline bradycardic rhythm in Cath Lab)   Statin ordered   Will need Insulin coverage while holding Metformin   Has chronic pain - Oxycodone ordered.   Smoking cessation.    Assessment and Plan   1. CAD -recent chest pain symptoms. EKG in clinic shows SR, no ischemic changes - we will obtain a lexiscan to further evaluate  2. Hyperlipidemia - reports myalgias on prior statins, tolerating pravastatin, -request labs from pcp    F/u pending test results   Arnoldo Lenis, M.D.

## 2017-03-02 NOTE — Therapy (Signed)
[place], [place], [address] Phone: [phone]   Fax:  [phone]  Physical Therapy Evaluation  Patient Details  Name: [name] MRN: [id number] Date of Birth: 19-Nov-1965 Referring Provider: Consuella Lose, MD   Encounter Date: 03/02/2017      PT End of Session - 03/02/17 1146    Visit Number 1   Number of Visits 13   Date for PT Re-Evaluation 03/23/17   Authorization Type BC[employer]   Authorization Time Period 03/02/17 to 04/13/17   Authorization - Number of Visits 30   PT Start Time 1100   PT Stop Time 1144   PT Time Calculation (min) 44 min   Activity Tolerance Patient limited by pain;No increased pain   Behavior During Therapy Anxious      Past Medical History:  Diagnosis Date  . Anxiety   . Borderline diabetes   . Bulging disc   . Chronic back pain   . Diabetes mellitus without complication ([place])   . Hypercholesteremia   . Pancreatitis     Past Surgical History:  Procedure Laterality Date  . ABDOMINAL SURGERY    . APPENDECTOMY    . BACK SURGERY    . CARDIAC CATHETERIZATION  2003   "trivial coronary artery disease," normal EF  . COLONOSCOPY N/A 04/16/2015   Procedure: COLONOSCOPY;  Surgeon: Daneil Dolin, MD;  [facility];  Service: Endoscopy;  Laterality: N/A;  12:45 PM  . LEFT HEART CATHETERIZATION WITH CORONARY ANGIOGRAM N/A 03/13/2014   Procedure: LEFT HEART CATHETERIZATION WITH CORONARY ANGIOGRAM;  Surgeon: Leonie Man, MD;  Location: [facility] CATH LAB;  Service: Cardiovascular;  Laterality: N/A;    There were no vitals filed for this visit.       Subjective Assessment - 03/02/17 1101    Subjective Pt reports that he has been having low back pain following his back surgery in May of 2018. He has trouble finding comfortable positions and will often have pain going down into his Lt leg. He is not sure how much therapy will help him but states he is willing to give it a try.    Pertinent History Low back surgery 2018 and ~2 years prior in the same location (per pt), anxiety, chronic back pain, MI 2-3 years ago    Limitations Other (comment)  just about everything    How long can you sit comfortably? unable    How long can you stand comfortably? unable    How long can you walk comfortably? unable    Patient Stated Goals improve pain    Currently in Pain? Yes   Pain Score --  No rating given    Pain Location Back   Pain Orientation Mid;Lower   Pain Descriptors / Indicators Aching;Tightness;Spasm   Pain Type Chronic pain   Pain Radiating Towards occasionally down the Lt (posterolateral)   Pain Onset More than a month ago   Pain Frequency Constant   Aggravating Factors  bending forward, housework, twisting    Pain Relieving Factors unable to find relief, but heat and his cream seem to help short term   Effect of Pain on Daily Activities severe            [facility] PT Assessment - 03/02/17 0001      Assessment   Medical Diagnosis LBP   Referring Provider Consuella Lose, MD    Onset Date/Surgical Date --  May 2018   Next MD Visit September  Prior Therapy none      Precautions   Precautions Back   Precaution Comments no heavy lifting, no excessive forward bending      Balance Screen   Has the patient fallen in the past 6 months No   Has the patient had a decrease in activity level because of a fear of falling?  No   Is the patient reluctant to leave their home because of a fear of falling?  No     Prior Function   Level of Independence Independent   Vocation On disability     Observation/Other Assessments   Observations sitting with Rt lateral lean    Focus on Therapeutic Outcomes (FOTO)  78% limited      ROM / Strength   AROM / PROM / Strength AROM;Strength     AROM   Overall AROM Comments Not tested due to pt heavy guarding, and rigid movements during evaluation    AROM Assessment Site Lumbar     Strength   Strength Assessment Site  Hip;Knee;Ankle   Right/Left Hip Right;Left   Right Hip Extension 3+/5  pain in low back    Right Hip ABduction 3+/5  (+) pain low back   Left Hip Extension 3+/5  pain in low back    Left Hip ABduction 3/5  pain in low back    Right/Left Knee Right;Left   Right Knee Flexion 3/5   Right Knee Extension 4/5   Left Knee Flexion 3/5   Left Knee Extension 4/5  Pain on Lt side of back    Right/Left Ankle Right;Left   Right Ankle Dorsiflexion 5/5   Left Ankle Dorsiflexion 5/5     Flexibility   Soft Tissue Assessment /Muscle Length yes   Hamstrings WNL   Quadriceps (+) Ely's BLE     Palpation   Palpation comment muscle spasm lumbar paraspinals, thoracic paraspinals     Transfers   Five time sit to stand comments  27.8 sec     Ambulation/Gait   Gait Comments decreased stance time on Lt, Decreased step length on Rt, slowed cadence with minimal arm swing and rigid      Standardized Balance Assessment   Standardized Balance Assessment Timed Up and Go Test     Timed Up and Go Test   TUG Comments 24 sec     High Level Balance   High Level Balance Comments single leg stance: Rt 10 sec, Lt 12 sec             Objective measurements completed on examination: See above findings.          [facility] Adult PT Treatment/Exercise - 03/02/17 0001      Therapeutic Activites    Therapeutic Activities Other Therapeutic Activities   Other Therapeutic Activities log roll x2 with verbal/tactile cues from therapist to ensure pt understanding      Exercises   Exercises Lumbar     Lumbar Exercises: Supine   Other Supine Lumbar Exercises Low trunk rotation x2 reps Lt/Rt, HEP demo    Other Supine Lumbar Exercises single knee to chest 1x5sec hold each LE, HEP demo                 PT Education - 03/02/17 1143    Education provided Yes   Education Details eval findings/POC; importance of committing to HEP adherence in order to receive optimal benefits; implemented HEP and  reviewed proper technique with log roll   Person(s) Educated Patient   Methods  Explanation;Tactile cues;Demonstration;Verbal cues;Handout   Comprehension Returned demonstration;Verbalized understanding          PT Short Term Goals - 03/02/17 1156      PT SHORT TERM GOAL #1   Title Pt will demo consistency and independence with his HEP to decrease pain and improve mobility.    Time 3   Period Weeks   Status New   Target Date 03/23/17     PT SHORT TERM GOAL #2   Title Pt will demo proper log roll technique during his session, with no more than verbal cues from therapist, to decrease lumbar strain and pain with bed mobility.    Time 3   Period Weeks   Status New           PT Long Term Goals - 03/02/17 1157      PT LONG TERM GOAL #1   Title Pt will demo improved BLE strength to atleast 4+/5 MMT, which will increase his safety with daily activity.    Time 6   Period Weeks   Status New   Target Date 04/13/17     PT LONG TERM GOAL #2   Title Pt will complete 5x sit to stand in less than 14 sec, to reflect improvements in his functional strength and activity tolerance.    Time 6   Period Weeks   Status New     PT LONG TERM GOAL #3   Title Pt will complete the TUG in less than 14 sec, to reflect improvements in gait speed and functional balance with return to activity in the community.    Time 6   Period Weeks   Status New     PT LONG TERM GOAL #4   Title Pt will report atleast 50% improvement in his symptoms since the start of PT, to allow him to complete houshold activity without significant difficulty and improve his quality of life.    Time 6   Period Weeks   Status New     PT LONG TERM GOAL #5   Title Pt will maintain single leg balance on each LE for atleast 20 sec, 2/3 trials, without LOB or [place] deviation, to reflect improvements in his trunk and LE stability.    Time 6   Period Weeks   Status New                Plan - 03/02/17 1148     Clinical Impression Statement Pt is a 51 y.o M referred to OPPT s/p lumbar surgery back in May of this year. Pt was unable to recall exactly what surgery he had, but continues to report pain and occasional symptoms down the Lt LE with activity which is limiting his ability to work and participate in other daily activities/chores around his home. Pt ambulated into the clinic with slow/antalgic pattern and lumbar AROM testing was deferred due to significant guarding. He presents with significant muscle spasm throughout the lumbar and thoracic paraspinals, likely contributing to his hip weakness and decreased gait speed and mobility. Therapist spent a large majority of today's session educating him on the benefits of PT and implementing his HEP with adequate review to ensure proper technique is used. Pt reported no specific change in his symptoms by the end of today's evaluation, however he was walking with improved step length and arm swing when leaving the clinic. He would benefit from skilled PT to address his limitations in strength, ROM, mobility and decrease his pain to allow him  to complete daily activities without as much difficulty and improve his overall quality of life.    History and Personal Factors relevant to plan of care: history of LBP, Lumbar surgery in May   Clinical Presentation Stable   Clinical Presentation due to: pain slightly improved since the surgery in May    Clinical Decision Making Low   Rehab Potential Good   PT Frequency 2x / week   PT Duration 6 weeks   PT Treatment/Interventions ADLs/Self Care Home Management;Electrical Stimulation;Moist Heat;Balance training;Therapeutic exercise;Therapeutic activities;Gait training;Neuromuscular re-education;Patient/family education;Manual techniques;Passive range of motion;Dry needling   PT Next Visit Plan STM and other manual techniques along lumbar paraspinals to decrease spasm, follow with gentle lumbar movements on mat table, ensure pt  uses proper log roll technique during his session    PT Home Exercise Plan log roll technique, SKTC 10x5" each, supine low trunk rotation x10 reps each side, prone on elbows x5 min AM/PM    Consulted and Agree with Plan of Care Patient      Patient will benefit from skilled therapeutic intervention in order to improve the following deficits and impairments:  Abnormal gait, Decreased activity tolerance, Decreased balance, Difficulty walking, Impaired flexibility, Hypomobility, Decreased strength, Decreased range of motion, Decreased endurance, Decreased mobility, Decreased scar mobility, Increased muscle spasms, Postural dysfunction, Pain, Improper body mechanics  Visit Diagnosis: Chronic midline low back pain, with sciatica presence unspecified - Plan: PT plan of care cert/re-cert  Other abnormalities of gait and mobility - Plan: PT plan of care cert/re-cert  Muscle spasm of back - Plan: PT plan of care cert/re-cert  Muscle weakness (generalized) - Plan: PT plan of care cert/re-cert     Problem List Patient Active Problem List   Diagnosis Date Noted  . History of colonic polyps   . Diverticulosis of colon without hemorrhage   . ST-segment elevation myocardial infarction (STEMI) of inferior wall ([place]) 03/14/2014  . Presence of bare metal stent in right coronary artery: VeriFlex BMS 5.0 mm x 15 mm (5.6 mm) 03/14/2014    Class: Status post  . CAD S/P percutaneous coronary angioplasty 03/14/2014  . Chest pain 03/13/2014  . Pancreatitis 01/19/2014  . Hyperlipidemia 01/19/2014  . Diabetes mellitus ([place]) 01/19/2014   12:12 PM,03/02/17 Elly Modena PT, DPT [facility] [place] [address] [place], [place], [address] Phone: [phone]   Fax:  [phone]  Name: [name] MRN: [id number] Date of Birth: 15-Jul-1965

## 2017-03-02 NOTE — Patient Instructions (Signed)
Medication Instructions:  Your physician recommends that you continue on your current medications as directed. Please refer to the Current Medication list given to you today.  Labwork: None  Testing/Procedures: Your physician has requested that you have a lexiscan myoview. For further information please visit [email or web address]. Please follow instruction sheet, as given.  Follow-Up: Your physician recommends that you schedule a follow-up appointment PENDING TEST RESULTS  Any Other Special Instructions Will Be Listed Below (If Applicable).  If you need a refill on your cardiac medications before your next appointment, please call your pharmacy.

## 2017-03-02 NOTE — Telephone Encounter (Signed)
Pre-cert Verification for the following procedure   Lexiscan scheduled for 03-10-17 at [facility]

## 2017-03-07 ENCOUNTER — Telehealth (HOSPITAL_COMMUNITY): Payer: Self-pay | Admitting: Family Medicine

## 2017-03-07 ENCOUNTER — Ambulatory Visit (HOSPITAL_COMMUNITY): Payer: BLUE CROSS/BLUE SHIELD | Admitting: Physical Therapy

## 2017-03-07 NOTE — Telephone Encounter (Signed)
03/07/17  cx due to having trouble with vehicle

## 2017-03-09 ENCOUNTER — Ambulatory Visit (HOSPITAL_COMMUNITY): Payer: BLUE CROSS/BLUE SHIELD | Admitting: Physical Therapy

## 2017-03-09 DIAGNOSIS — R2689 Other abnormalities of gait and mobility: Secondary | ICD-10-CM

## 2017-03-09 DIAGNOSIS — M62830 Muscle spasm of back: Secondary | ICD-10-CM

## 2017-03-09 DIAGNOSIS — M545 Low back pain: Principal | ICD-10-CM

## 2017-03-09 DIAGNOSIS — M6281 Muscle weakness (generalized): Secondary | ICD-10-CM

## 2017-03-09 DIAGNOSIS — G8929 Other chronic pain: Secondary | ICD-10-CM

## 2017-03-09 NOTE — Therapy (Signed)
[place], [place], [address] Phone: [phone]   Fax:  [phone]  Physical Therapy Treatment  Patient Details  Name: [name] MRN: [id number] Date of Birth: [birth year] Referring Provider: Consuella Lose, MD   Encounter Date: 03/09/2017      PT End of Session - 03/09/17 0825    Visit Number 2   Number of Visits 13   Date for PT Re-Evaluation 03/23/17   Authorization Type BCBS   Authorization Time Period 03/02/17 to 04/13/17   Authorization - Number of Visits 30   PT Start Time 0817   PT Stop Time 6962   PT Time Calculation (min) 40 min   Activity Tolerance Patient limited by pain;No increased pain   Behavior During Therapy Anxious      Past Medical History:  Diagnosis Date  . Anxiety   . Borderline diabetes   . Bulging disc   . Chronic back pain   . Diabetes mellitus without complication ([place])   . Hypercholesteremia   . Pancreatitis     Past Surgical History:  Procedure Laterality Date  . ABDOMINAL SURGERY    . APPENDECTOMY    . BACK SURGERY    . CARDIAC CATHETERIZATION  2003   "trivial coronary artery disease," normal EF  . COLONOSCOPY N/A 04/16/2015   Procedure: COLONOSCOPY;  Surgeon: Daneil Dolin, MD;  [facility];  Service: Endoscopy;  Laterality: N/A;  12:45 PM  . LEFT HEART CATHETERIZATION WITH CORONARY ANGIOGRAM N/A 03/13/2014   Procedure: LEFT HEART CATHETERIZATION WITH CORONARY ANGIOGRAM;  Surgeon: Leonie Man, MD;  Location: [facility] CATH LAB;  Service: Cardiovascular;  Laterality: N/A;    There were no vitals filed for this visit.      Subjective Assessment - 03/09/17 0820    Subjective Pt reports that things are going well ok. He is completing his exercises but still feels like his back is tight. He is walking better compared to when he came for his evaluation.    Pertinent History Low back surgery 2018 and ~2 years prior in the same location (per pt), anxiety,  chronic back pain, MI 2-3 years ago    Limitations Other (comment)  just about everything    How long can you sit comfortably? unable    How long can you stand comfortably? unable    How long can you walk comfortably? unable    Patient Stated Goals improve pain    Currently in Pain? Yes   Pain Score --  No rating provided, just hurts    Pain Location Back   Pain Orientation Mid;Lower   Pain Descriptors / Indicators Aching;Tightness;Spasm   Pain Type Chronic pain   Pain Radiating Towards none    Pain Onset More than a month ago   Pain Frequency Constant   Aggravating Factors  being very active   Pain Relieving Factors unable to find relief, heat is helping    Effect of Pain on Daily Activities severe                         [facility] Adult PT Treatment/Exercise - 03/09/17 0001      Lumbar Exercises: Supine   Ab Set 10 reps   AB Set Limitations 3 sec with BUE pressdown    Bent Knee Raise 10 reps   Bent Knee Raise Limitations Lt and Rt alternating, with BUE pressdown   Other Supine Lumbar Exercises low  trunk rotation x15 reps Lt and Rt; Supine rolling Lt and Rt with physioball press into knees 2x5 reps each side    Other Supine Lumbar Exercises SKTC 5x10 sec each LE     Manual Therapy   Manual Therapy Soft tissue mobilization   Manual therapy comments separate rest of session; pillow underneath hips for pt tolerance of position   Soft tissue mobilization STM along lumbar and thoracic paraspinals; scar desensitization with washcloth                 PT Education - 03/09/17 0829    Education provided Yes   Education Details discussed importance of introducing slow/gentle movements; importance of following his HEP as intructed to ensure optimal benefits are obtained.    Person(s) Educated Patient   Methods Explanation   Comprehension Verbalized understanding          PT Short Term Goals - 03/02/17 1156      PT SHORT TERM GOAL #1   Title Pt will demo  consistency and independence with his HEP to decrease pain and improve mobility.    Time 3   Period Weeks   Status New   Target Date 03/23/17     PT SHORT TERM GOAL #2   Title Pt will demo proper log roll technique during his session, with no more than verbal cues from therapist, to decrease lumbar strain and pain with bed mobility.    Time 3   Period Weeks   Status New           PT Long Term Goals - 03/02/17 1157      PT LONG TERM GOAL #1   Title Pt will demo improved BLE strength to atleast 4+/5 MMT, which will increase his safety with daily activity.    Time 6   Period Weeks   Status New   Target Date 04/13/17     PT LONG TERM GOAL #2   Title Pt will complete 5x sit to stand in less than 14 sec, to reflect improvements in his functional strength and activity tolerance.    Time 6   Period Weeks   Status New     PT LONG TERM GOAL #3   Title Pt will complete the TUG in less than 14 sec, to reflect improvements in gait speed and functional balance with return to activity in the community.    Time 6   Period Weeks   Status New     PT LONG TERM GOAL #4   Title Pt will report atleast 50% improvement in his symptoms since the start of PT, to allow him to complete houshold activity without significant difficulty and improve his quality of life.    Time 6   Period Weeks   Status New     PT LONG TERM GOAL #5   Title Pt will maintain single leg balance on each LE for atleast 20 sec, 2/3 trials, without LOB or [place] deviation, to reflect improvements in his trunk and LE stability.    Time 6   Period Weeks   Status New               Plan - 03/09/17 0932    Clinical Impression Statement Pt arrived today walking more upright and with increase in his cadence compared to his evaluation. Session began with review of his HEP to ensure he is using proper technique at home. Introduced more exercises to improve deep abdominal activation and neuromuscular control, with  pt able to follow directions requiring minimal cues to incorporate breathing into his exercises. Ended with manual techniques to address muscle spasm throughout the lumbar paraspinals, with pt reporting no increase in his pain following today's session.     Rehab Potential Good   PT Frequency 2x / week   PT Duration 6 weeks   PT Treatment/Interventions ADLs/Self Care Home Management;Electrical Stimulation;Moist Heat;Balance training;Therapeutic exercise;Therapeutic activities;Gait training;Neuromuscular re-education;Patient/family education;Manual techniques;Passive range of motion;Dry needling   PT Next Visit Plan continue to use STM and other manual techniques along lumbar paraspinals to decrease spasm, progressing supine and sidelying deep abdominal exercises    PT Home Exercise Plan log roll technique, SKTC 10x5" each, supine low trunk rotation x10 reps each side, prone on elbows x5 min AM/PM    Consulted and Agree with Plan of Care Patient      Patient will benefit from skilled therapeutic intervention in order to improve the following deficits and impairments:  Abnormal gait, Decreased activity tolerance, Decreased balance, Difficulty walking, Impaired flexibility, Hypomobility, Decreased strength, Decreased range of motion, Decreased endurance, Decreased mobility, Decreased scar mobility, Increased muscle spasms, Postural dysfunction, Pain, Improper body mechanics  Visit Diagnosis: Chronic midline low back pain, with sciatica presence unspecified  Other abnormalities of gait and mobility  Muscle spasm of back  Muscle weakness (generalized)     Problem List Patient Active Problem List   Diagnosis Date Noted  . History of colonic polyps   . Diverticulosis of colon without hemorrhage   . ST-segment elevation myocardial infarction (STEMI) of inferior wall ([place]) 03/14/2014  . Presence of bare metal stent in right coronary artery: VeriFlex BMS 5.0 mm x 15 mm (5.6 mm) 03/14/2014     Class: Status post  . CAD S/P percutaneous coronary angioplasty 03/14/2014  . Chest pain 03/13/2014  . Pancreatitis 01/19/2014  . Hyperlipidemia 01/19/2014  . Diabetes mellitus ([place]) 01/19/2014    8:59 AM,03/09/17 Elly Modena PT, DPT [facility] [place] [address] [place], [place], [address] Phone: [phone]   Fax:  [phone]  Name: [name] MRN: [id number] Date of Birth: [birth year]

## 2017-03-10 ENCOUNTER — Encounter (HOSPITAL_COMMUNITY): Payer: Self-pay

## 2017-03-10 ENCOUNTER — Ambulatory Visit: Payer: BLUE CROSS/BLUE SHIELD | Admitting: Cardiology

## 2017-03-10 ENCOUNTER — Encounter (HOSPITAL_COMMUNITY)
Admission: RE | Admit: 2017-03-10 | Discharge: 2017-03-10 | Disposition: A | Discharge status: Home / Self Care | Payer: BLUE CROSS/BLUE SHIELD | Source: Ambulatory Visit | Attending: Cardiology | Admitting: Cardiology

## 2017-03-10 ENCOUNTER — Encounter (HOSPITAL_BASED_OUTPATIENT_CLINIC_OR_DEPARTMENT_OTHER)
Admission: RE | Admit: 2017-03-10 | Discharge: 2017-03-10 | Disposition: A | Discharge status: Home / Self Care | Payer: BLUE CROSS/BLUE SHIELD | Source: Ambulatory Visit | Attending: Cardiology | Admitting: Cardiology

## 2017-03-10 DIAGNOSIS — R079 Chest pain, unspecified: Secondary | ICD-10-CM | POA: Insufficient documentation

## 2017-03-10 LAB — NM MYOCAR MULTI W/SPECT W/WALL MOTION / EF
CHL CUP NUCLEAR SRS: 0
CHL CUP NUCLEAR SSS: 0
LHR: 0.67
LV dias vol: 114 mL (ref 62–150)
LV sys vol: 42 mL
Peak HR: 77 {beats}/min
Rest HR: 57 {beats}/min
SDS: 0
TID: 1.05

## 2017-03-10 MED ORDER — TECHNETIUM TC 99M TETROFOSMIN IV KIT
30.0000 | PACK | Freq: Once | INTRAVENOUS | Status: AC | PRN
  Administered 2017-03-10: 33 via INTRAVENOUS

## 2017-03-10 MED ORDER — REGADENOSON 0.4 MG/5ML IV SOLN
INTRAVENOUS | Status: AC
  Administered 2017-03-10: 0.4 mg via INTRAVENOUS
  Filled 2017-03-10: qty 5

## 2017-03-10 MED ORDER — SODIUM CHLORIDE 0.9% FLUSH
INTRAVENOUS | Status: AC
  Administered 2017-03-10: 10 mL via INTRAVENOUS
  Filled 2017-03-10: qty 10

## 2017-03-10 MED ORDER — TECHNETIUM TC 99M TETROFOSMIN IV KIT
10.0000 | PACK | Freq: Once | INTRAVENOUS | Status: AC | PRN
  Administered 2017-03-10: 10 via INTRAVENOUS

## 2017-03-14 ENCOUNTER — Ambulatory Visit (HOSPITAL_COMMUNITY): Payer: BLUE CROSS/BLUE SHIELD | Attending: Neurosurgery

## 2017-03-14 ENCOUNTER — Encounter (HOSPITAL_COMMUNITY): Payer: Self-pay

## 2017-03-14 DIAGNOSIS — M545 Low back pain: Secondary | ICD-10-CM | POA: Diagnosis not present

## 2017-03-14 DIAGNOSIS — R2689 Other abnormalities of gait and mobility: Secondary | ICD-10-CM

## 2017-03-14 DIAGNOSIS — G8929 Other chronic pain: Secondary | ICD-10-CM | POA: Insufficient documentation

## 2017-03-14 DIAGNOSIS — M6281 Muscle weakness (generalized): Secondary | ICD-10-CM | POA: Diagnosis present

## 2017-03-14 DIAGNOSIS — M62830 Muscle spasm of back: Secondary | ICD-10-CM | POA: Insufficient documentation

## 2017-03-14 NOTE — Therapy (Signed)
[place], [place], [address] Phone: [phone]   Fax:  [phone]  Physical Therapy Treatment  Patient Details  Name: [name] MRN: [id number] Date of Birth: 1966/05/16 Referring Provider: Consuella Lose, MD   Encounter Date: 03/14/2017      PT End of Session - 03/14/17 1413    Visit Number 3   Number of Visits 13   Date for PT Re-Evaluation 03/23/17   Authorization Type BCBS   Authorization Time Period 03/02/17 to 04/13/17   Authorization - Number of Visits 30   PT Start Time 1342   PT Stop Time 1422   PT Time Calculation (min) 40 min   Activity Tolerance Patient limited by pain   Behavior During Therapy Anxious  fear avoidance behaviors      Past Medical History:  Diagnosis Date  . Anxiety   . Borderline diabetes   . Bulging disc   . Chronic back pain   . Diabetes mellitus without complication ([place])   . Hypercholesteremia   . Pancreatitis     Past Surgical History:  Procedure Laterality Date  . ABDOMINAL SURGERY    . APPENDECTOMY    . BACK SURGERY    . CARDIAC CATHETERIZATION  2003   "trivial coronary artery disease," normal EF  . COLONOSCOPY N/A 04/16/2015   Procedure: COLONOSCOPY;  Surgeon: Daneil Dolin, MD;  [facility];  Service: Endoscopy;  Laterality: N/A;  12:45 PM  . LEFT HEART CATHETERIZATION WITH CORONARY ANGIOGRAM N/A 03/13/2014   Procedure: LEFT HEART CATHETERIZATION WITH CORONARY ANGIOGRAM;  Surgeon: Leonie Man, MD;  Location: [facility] CATH LAB;  Service: Cardiovascular;  Laterality: N/A;    There were no vitals filed for this visit.      Subjective Assessment - 03/14/17 1346    Subjective Pt reports that he continues to have some pain in his back, no changes sicne last visit. Pt reports is working on his HEP daily. He says his pain is aggravated by his HEP, but he takes his time.    Pertinent History Low back surgery 2018 and ~2 years prior in the same  location (per pt), anxiety, chronic back pain, MI 2-3 years ago    Limitations Other (comment)   Currently in Pain? Yes   Pain Score 3    Pain Location --  central back pain from surgical scar and upward 4-6 levels of the spine. mostly superfiscial.    Pain Orientation Medial                         [facility] Adult PT Treatment/Exercise - 03/14/17 0001      Lumbar Exercises: Seated   Sit to Stand 10 reps  elevated from chair + pillow     Lumbar Exercises: Supine   Other Supine Lumbar Exercises Double knee to chest stretch: 5x30sec   Repeated DKTC, oscillatory, 2Hz , 2x20      Modalities   Modalities Moist Heat     Moist Heat Therapy   Moist Heat Location Lumbar Spine;Other (comment)  thoracic spine      Manual Therapy   Manual Therapy Soft tissue mobilization;Myofascial release;Joint mobilization   Joint Mobilization 4x30sec Grade 2 PA t-spine mobs, regional    Myofascial Release thoraco lumbar paraspinals bilat T9-L3                PT Education - 03/14/17 1426    Education provided Yes  Education Details eduated on daily walking starting at 12 minutes; taking thedog into the yard does nto count as continuous walking.    Person(s) Educated Patient   Methods Explanation   Comprehension Verbalized understanding          PT Short Term Goals - 03/02/17 1156      PT SHORT TERM GOAL #1   Title Pt will demo consistency and independence with his HEP to decrease pain and improve mobility.    Time 3   Period Weeks   Status New   Target Date 03/23/17     PT SHORT TERM GOAL #2   Title Pt will demo proper log roll technique during his session, with no more than verbal cues from therapist, to decrease lumbar strain and pain with bed mobility.    Time 3   Period Weeks   Status New           PT Long Term Goals - 03/02/17 1157      PT LONG TERM GOAL #1   Title Pt will demo improved BLE strength to atleast 4+/5 MMT, which will increase his safety  with daily activity.    Time 6   Period Weeks   Status New   Target Date 04/13/17     PT LONG TERM GOAL #2   Title Pt will complete 5x sit to stand in less than 14 sec, to reflect improvements in his functional strength and activity tolerance.    Time 6   Period Weeks   Status New     PT LONG TERM GOAL #3   Title Pt will complete the TUG in less than 14 sec, to reflect improvements in gait speed and functional balance with return to activity in the community.    Time 6   Period Weeks   Status New     PT LONG TERM GOAL #4   Title Pt will report atleast 50% improvement in his symptoms since the start of PT, to allow him to complete houshold activity without significant difficulty and improve his quality of life.    Time 6   Period Weeks   Status New     PT LONG TERM GOAL #5   Title Pt will maintain single leg balance on each LE for atleast 20 sec, 2/3 trials, without LOB or [place] deviation, to reflect improvements in his trunk and LE stability.    Time 6   Period Weeks   Status New               Plan - 03/14/17 1416    Clinical Impression Statement Pt continues to demonstrate allodynia, muslce spasm, guarding, and anxiety related to the bialt back hupper lumbar to central thoracic. Pain point threshold remains heighted. Extensive education is given on the importance of heeling, and redirection of thoughts perseverating onthe "injured" status of his back. PT explains that most hof his current pain is unrelated to surgical problem several months back,. He demonstrates some reluctance to perform multiplesets/reps when discomfort is primary response, but respeonds well to encouragement to perform andvisually appears to be tolerating well. Mild progress toward goasl thus far.    Rehab Potential Good   PT Frequency 2x / week   PT Duration 6 weeks   PT Treatment/Interventions ADLs/Self Care Home Management;Electrical Stimulation;Moist Heat;Balance training;Therapeutic  exercise;Therapeutic activities;Gait training;Neuromuscular re-education;Patient/family education;Manual techniques;Passive range of motion;Dry needling   PT Next Visit Plan Cont education on importance of movement, deemphasize that something is injured at this  time. supportize modalities to address heightened pain, but emphasize normalized movement and reluctance surrounding it.     PT Home Exercise Plan log roll technique, SKTC 10x5" each, supine low trunk rotation x10 reps each side, prone on elbows x5 min AM/PM    Consulted and Agree with Plan of Care Patient      Patient will benefit from skilled therapeutic intervention in order to improve the following deficits and impairments:  Abnormal gait, Decreased activity tolerance, Decreased balance, Difficulty walking, Impaired flexibility, Hypomobility, Decreased strength, Decreased range of motion, Decreased endurance, Decreased mobility, Decreased scar mobility, Increased muscle spasms, Postural dysfunction, Pain, Improper body mechanics  Visit Diagnosis: Chronic midline low back pain, with sciatica presence unspecified  Other abnormalities of gait and mobility  Muscle spasm of back  Muscle weakness (generalized)     Problem List Patient Active Problem List   Diagnosis Date Noted  . History of colonic polyps   . Diverticulosis of colon without hemorrhage   . ST-segment elevation myocardial infarction (STEMI) of inferior wall ([place]) 03/14/2014  . Presence of bare metal stent in right coronary artery: VeriFlex BMS 5.0 mm x 15 mm (5.6 mm) 03/14/2014    Class: Status post  . CAD S/P percutaneous coronary angioplasty 03/14/2014  . Chest pain 03/13/2014  . Pancreatitis 01/19/2014  . Hyperlipidemia 01/19/2014  . Diabetes mellitus ([place]) 01/19/2014    Stark Aguinaga C 03/14/2017, 2:28 PM   2:28 PM, 03/14/17 Etta Grandchild, PT, DPT Physical Therapist - [place] [phone] (Office)    [place], [place], [address] Phone: [phone]   Fax:  [phone]  Name: JERELLE VIRDEN MRN: [id number] Date of Birth: 1965/08/18

## 2017-03-16 ENCOUNTER — Telehealth: Payer: Self-pay | Admitting: Cardiology

## 2017-03-16 NOTE — Telephone Encounter (Signed)
Patient called checking to see if his stress test results have been read.

## 2017-03-16 NOTE — Telephone Encounter (Signed)
Asking for results from test 

## 2017-03-16 NOTE — Telephone Encounter (Signed)
Called pt., no answer. Left messages for pt. To return call.

## 2017-03-16 NOTE — Telephone Encounter (Signed)
-----   Message from Arnoldo Lenis, MD sent at 03/16/2017  3:27 PM EDT ----- Stress test overall looks good, no major new blockages. There is one small area that was slightly abnormal that may represent a small blockage that could cause some pain but is not big enough to be of any significant risk. If still having chest pain would try imdur 15mg  daily, f/u with me in 1 month  Zandra Abts MD

## 2017-03-16 NOTE — Telephone Encounter (Signed)
Stress test done on 8/31 - pt aware that would call with results when Dr Harl Bowie dictated on

## 2017-03-17 ENCOUNTER — Ambulatory Visit (HOSPITAL_COMMUNITY): Payer: BLUE CROSS/BLUE SHIELD

## 2017-03-17 ENCOUNTER — Telehealth: Payer: Self-pay | Admitting: *Deleted

## 2017-03-17 ENCOUNTER — Telehealth (HOSPITAL_COMMUNITY): Payer: Self-pay

## 2017-03-17 DIAGNOSIS — M545 Low back pain: Secondary | ICD-10-CM | POA: Diagnosis not present

## 2017-03-17 DIAGNOSIS — M6281 Muscle weakness (generalized): Secondary | ICD-10-CM

## 2017-03-17 DIAGNOSIS — R2689 Other abnormalities of gait and mobility: Secondary | ICD-10-CM

## 2017-03-17 DIAGNOSIS — M62830 Muscle spasm of back: Secondary | ICD-10-CM

## 2017-03-17 DIAGNOSIS — G8929 Other chronic pain: Secondary | ICD-10-CM

## 2017-03-17 MED ORDER — ISOSORBIDE MONONITRATE ER 30 MG PO TB24: 15.0000 mg | ORAL_TABLET | Freq: Every day | ORAL | 0 refills | Status: DC

## 2017-03-17 NOTE — Therapy (Signed)
**Note Thomas-Identified via Obfuscation** [place], [place], [address] Phone: [phone]   Fax:  [phone]  Physical Therapy Treatment  Patient Details  Name: [name] MRN: [id number] Date of Birth: [birth year] Referring Provider: Consuella Lose, MD   Encounter Date: 03/17/2017      PT End of Session - 03/17/17 1138    Visit Number 4   Number of Visits 13   Date for PT Re-Evaluation 03/23/17   Authorization Type BCBS   Authorization Time Period 03/02/17 to 04/13/17   PT Start Time 1117   PT Stop Time 1157   PT Time Calculation (min) 40 min   Activity Tolerance Patient limited by pain   Behavior During Therapy Anxious  fear avoidance bradykinesia rlelated to chronic pain       Past Medical History:  Diagnosis Date  . Anxiety   . Borderline diabetes   . Bulging disc   . Chronic back pain   . Diabetes mellitus without complication ([place])   . Hypercholesteremia   . Pancreatitis     Past Surgical History:  Procedure Laterality Date  . ABDOMINAL SURGERY    . APPENDECTOMY    . BACK SURGERY    . CARDIAC CATHETERIZATION  2003   "trivial coronary artery disease," normal EF  . COLONOSCOPY N/A 04/16/2015   Procedure: COLONOSCOPY;  Surgeon: Daneil Dolin, MD;  [facility];  Service: Endoscopy;  Laterality: N/A;  12:45 PM  . LEFT HEART CATHETERIZATION WITH CORONARY ANGIOGRAM N/A 03/13/2014   Procedure: LEFT HEART CATHETERIZATION WITH CORONARY ANGIOGRAM;  Surgeon: Leonie Man, MD;  Location: [facility] CATH LAB;  Service: Cardiovascular;  Laterality: N/A;    There were no vitals filed for this visit.      Subjective Assessment - 03/17/17 1121    Subjective Patient reports he heard back from his doctor and he now has a blockage. He will be starting a new medication to address this and will likely need a new stent if conservative management does not help. He attests to intermittent CP, and says he can differentiatie between this adn back  pain.    Pertinent History Low back surgery 2018 and ~2 years prior in the same location (per pt), anxiety, chronic back pain, MI 2-3 years ago    Currently in Pain? Yes   Pain Score 3    Pain Onset More than a month ago                         [facility] Adult PT Treatment/Exercise - 03/17/17 0001      Lumbar Exercises: Stretches   Single Knee to Chest Stretch 30 seconds;3 reps   Double Knee to Chest Stretch --  passive with table tilt/heat    Double Knee to Chest Stretch Limitations 2 minutes hooklying, 2' @ 10 degrees, 2' @ 20 degrees, 2' @30  degrees      Lumbar Exercises: Supine   Clam 15 reps  1x15 BlueTB clams supine   Heel Slides 15 reps  1x15 bilat   Bent Knee Raise 20 reps  2x10sec   Bridge 20 reps  comfort range; wide knees/feet to decreases paraspinal loadi   Bridge Limitations 2x10   Other Supine Lumbar Exercises Double knee to chest stretch: 5x30sec   Repeated DKTC, oscillatory, 2Hz , 2x20      Modalities   Modalities Moist Heat     Moist Heat Therapy   Number Minutes Moist Heat 10  Minutes   Moist Heat Location Lumbar Spine;Other (comment)  thoracic spine, concordant with stretching for pain modulati                  PT Short Term Goals - 03/02/17 1156      PT SHORT TERM GOAL #1   Title Pt will demo consistency and independence with his HEP to decrease pain and improve mobility.    Time 3   Period Weeks   Status New   Target Date 03/23/17     PT SHORT TERM GOAL #2   Title Pt will demo proper log roll technique during his session, with no more than verbal cues from therapist, to decrease lumbar strain and pain with bed mobility.    Time 3   Period Weeks   Status New           PT Long Term Goals - 03/02/17 1157      PT LONG TERM GOAL #1   Title Pt will demo improved BLE strength to atleast 4+/5 MMT, which will increase his safety with daily activity.    Time 6   Period Weeks   Status New   Target Date 04/13/17     PT  LONG TERM GOAL #2   Title Pt will complete 5x sit to stand in less than 14 sec, to reflect improvements in his functional strength and activity tolerance.    Time 6   Period Weeks   Status New     PT LONG TERM GOAL #3   Title Pt will complete the TUG in less than 14 sec, to reflect improvements in gait speed and functional balance with return to activity in the community.    Time 6   Period Weeks   Status New     PT LONG TERM GOAL #4   Title Pt will report atleast 50% improvement in his symptoms since the start of PT, to allow him to complete houshold activity without significant difficulty and improve his quality of life.    Time 6   Period Weeks   Status New     PT LONG TERM GOAL #5   Title Pt will maintain single leg balance on each LE for atleast 20 sec, 2/3 trials, without LOB or [place] deviation, to reflect improvements in his trunk and LE stability.    Time 6   Period Weeks   Status New               Plan - 03/17/17 1139    Clinical Impression Statement Pt toleratin gsession well todya, still some fear with fast movement, but more participatory and less hesiotance to participate. He progressivley gains confidence with limb movement during session. Table positioning is used to assist with lumbar soft tissue mobility restrictions, and heat to modulate pain response. Pt moved to Nustep (slow, no resistance) at end of session to address kinesiophobia and help patient regain confidence and security with safe and simple movement. Pt making progresss toward goals overall. Continues to deomnstrate severe lumbothroacic hypomobility, excessive lordotic posturing and weakness of the trunk flexors.    History and Personal Factors relevant to plan of care: Now with newly identified coronary blockage.    Rehab Potential Good   PT Frequency 2x / week   PT Duration 6 weeks   PT Treatment/Interventions ADLs/Self Care Home Management;Electrical Stimulation;Moist Heat;Balance  training;Therapeutic exercise;Therapeutic activities;Gait training;Neuromuscular re-education;Patient/family education;Manual techniques;Passive range of motion;Dry needling   PT Next Visit Plan Get updates on new  medications Cont education on importance of movement, deemphasize that something is injured at this time. supportize modalities to address heightened pain, but emphasize normalized movement and reluctance surrounding it.     PT Home Exercise Plan log roll technique, SKTC 10x5" each, supine low trunk rotation x10 reps each side, prone on elbows x5 min AM/PM    Consulted and Agree with Plan of Care Patient      Patient will benefit from skilled therapeutic intervention in order to improve the following deficits and impairments:  Abnormal gait, Decreased activity tolerance, Decreased balance, Difficulty walking, Impaired flexibility, Hypomobility, Decreased strength, Decreased range of motion, Decreased endurance, Decreased mobility, Decreased scar mobility, Increased muscle spasms, Postural dysfunction, Pain, Improper body mechanics  Visit Diagnosis: Chronic midline low back pain, with sciatica presence unspecified  Other abnormalities of gait and mobility  Muscle spasm of back  Muscle weakness (generalized)     Problem List Patient Active Problem List   Diagnosis Date Noted  . History of colonic polyps   . Diverticulosis of colon without hemorrhage   . ST-segment elevation myocardial infarction (STEMI) of inferior wall ([place]) 03/14/2014  . Presence of bare metal stent in right coronary artery: VeriFlex BMS 5.0 mm x 15 mm (5.6 mm) 03/14/2014    Class: Status post  . CAD S/P percutaneous coronary angioplasty 03/14/2014  . Chest pain 03/13/2014  . Pancreatitis 01/19/2014  . Hyperlipidemia 01/19/2014  . Diabetes mellitus ([place]) 01/19/2014   11:58 AM, 03/17/17 Etta Grandchild, PT, DPT Physical Therapist at [facility] [phone] (office)      Etta Grandchild 03/17/2017, 11:58 AM  [place], [place], [address] Phone: [phone]   Fax:  [phone]  Name: [name] MRN: [id number] Date of Birth: 1966/07/01

## 2017-03-17 NOTE — Telephone Encounter (Signed)
Patient informed,agrees and verbalized understanding of plan. 

## 2017-03-17 NOTE — Telephone Encounter (Signed)
Pt called stating he has a 65% blockage in his heart and wanted PT to advise if he should continue PT. After speaking with Zenia Resides B. -the pt was told to continue. NF 03/17/17

## 2017-03-17 NOTE — Telephone Encounter (Signed)
PT from [facility] is calling to see if patient should have limitations or restrictions with the new findings on his stress test. Please send response to [facility] via [employer].

## 2017-03-20 NOTE — Telephone Encounter (Signed)
No new limitations for patient.   J Sharmane Dame MD

## 2017-03-20 NOTE — Telephone Encounter (Signed)
Routed to [facility] via [employer]

## 2017-03-21 ENCOUNTER — Ambulatory Visit (HOSPITAL_COMMUNITY): Payer: BLUE CROSS/BLUE SHIELD

## 2017-03-22 ENCOUNTER — Telehealth (HOSPITAL_COMMUNITY): Payer: Self-pay | Admitting: Family Medicine

## 2017-03-22 NOTE — Telephone Encounter (Signed)
03/22/17  pt left a message to cx but no reason was given

## 2017-03-23 ENCOUNTER — Ambulatory Visit (HOSPITAL_COMMUNITY): Payer: BLUE CROSS/BLUE SHIELD

## 2017-03-27 ENCOUNTER — Telehealth (HOSPITAL_COMMUNITY): Payer: Self-pay | Admitting: Family Medicine

## 2017-03-27 NOTE — Telephone Encounter (Signed)
03/27/17 pt called to confirm appt time.

## 2017-03-28 ENCOUNTER — Telehealth (HOSPITAL_COMMUNITY): Payer: Self-pay

## 2017-03-28 ENCOUNTER — Ambulatory Visit (HOSPITAL_COMMUNITY): Payer: BLUE CROSS/BLUE SHIELD

## 2017-03-28 NOTE — Telephone Encounter (Signed)
It's no way he can make it today

## 2017-03-30 ENCOUNTER — Ambulatory Visit (HOSPITAL_COMMUNITY): Payer: BLUE CROSS/BLUE SHIELD

## 2017-03-30 ENCOUNTER — Other Ambulatory Visit: Payer: Self-pay | Admitting: Cardiology

## 2017-03-30 ENCOUNTER — Encounter (HOSPITAL_COMMUNITY): Payer: Self-pay

## 2017-03-30 DIAGNOSIS — M62830 Muscle spasm of back: Secondary | ICD-10-CM

## 2017-03-30 DIAGNOSIS — M6281 Muscle weakness (generalized): Secondary | ICD-10-CM

## 2017-03-30 DIAGNOSIS — M545 Low back pain: Secondary | ICD-10-CM | POA: Diagnosis not present

## 2017-03-30 DIAGNOSIS — R2689 Other abnormalities of gait and mobility: Secondary | ICD-10-CM

## 2017-03-30 DIAGNOSIS — G8929 Other chronic pain: Secondary | ICD-10-CM

## 2017-03-30 NOTE — Therapy (Signed)
[place], [place], [address] Phone: [phone]   Fax:  [phone]  Physical Therapy Treatment  Patient Details  Name: [name] MRN: [id number] Date of Birth: July 10, 1966 Referring Provider: Consuella Lose, MD   Encounter Date: 03/30/2017      PT End of Session - 03/30/17 1400    Visit Number 5   Number of Visits 13   Date for PT Re-Evaluation 03/23/17   Authorization Type BCBS   Authorization Time Period 03/02/17 to 04/13/17   Authorization - Number of Visits 30   PT Start Time 1350   PT Stop Time 1430   PT Time Calculation (min) 40 min   Activity Tolerance Patient limited by pain   Behavior During Therapy Anxious      Past Medical History:  Diagnosis Date  . Anxiety   . Borderline diabetes   . Bulging disc   . Chronic back pain   . Diabetes mellitus without complication ([place])   . Hypercholesteremia   . Pancreatitis     Past Surgical History:  Procedure Laterality Date  . ABDOMINAL SURGERY    . APPENDECTOMY    . BACK SURGERY    . CARDIAC CATHETERIZATION  2003   "trivial coronary artery disease," normal EF  . COLONOSCOPY N/A 04/16/2015   Procedure: COLONOSCOPY;  Surgeon: Daneil Dolin, MD;  [facility];  Service: Endoscopy;  Laterality: N/A;  12:45 PM  . LEFT HEART CATHETERIZATION WITH CORONARY ANGIOGRAM N/A 03/13/2014   Procedure: LEFT HEART CATHETERIZATION WITH CORONARY ANGIOGRAM;  Surgeon: Leonie Man, MD;  Location: [facility] CATH LAB;  Service: Cardiovascular;  Laterality: N/A;    There were no vitals filed for this visit.      Subjective Assessment - 03/30/17 1355    Subjective Pt reports he went back to back doctor yesterday, no change in medication and encouraged to continue with PT.  Pt reports he continues to have constant sharp pain lower back scale 5/10.  Reports intermittent compliance with HEP dependent on pain.     Pertinent History Low back surgery 2018 and ~2  years prior in the same location (per pt), anxiety, chronic back pain, MI 2-3 years ago    Patient Stated Goals improve pain    Currently in Pain? Yes   Pain Score 5    Pain Location Back   Pain Orientation Lower   Pain Descriptors / Indicators Sharp;Sore;Aching;Tightness   Pain Type Chronic pain   Pain Onset More than a month ago   Pain Frequency Constant   Aggravating Factors  being very active   Pain Relieving Factors unable to find relief, heat is helpful   Effect of Pain on Daily Activities severe                         [facility] Adult PT Treatment/Exercise - 03/30/17 0001      Lumbar Exercises: Stretches   Single Knee to Chest Stretch 30 seconds;3 reps   Lower Trunk Rotation Limitations 10x 10" with ab set   Prone on Elbows Stretch 1 rep  5 min     Lumbar Exercises: Supine   Clam 15 reps  theraband resistance and cueing for abset for   Bent Knee Raise 20 reps  with ab set; 2 sets   Bridge 20 reps   Bridge Limitations 2x10   Other Supine Lumbar Exercises SKTC 5x10 sec each LE  Manual Therapy   Manual Therapy Soft tissue mobilization;Myofascial release   Manual therapy comments separate rest of session; pillow underneath hips for pt tolerance of position   Soft tissue mobilization STM along lumbar and thoracic paraspinals; scar desensitization with washcloth    Myofascial Release thoraco lumbar paraspinals bilat T9-L3                  PT Short Term Goals - 03/02/17 1156      PT SHORT TERM GOAL #1   Title Pt will demo consistency and independence with his HEP to decrease pain and improve mobility.    Time 3   Period Weeks   Status New   Target Date 03/23/17     PT SHORT TERM GOAL #2   Title Pt will demo proper log roll technique during his session, with no more than verbal cues from therapist, to decrease lumbar strain and pain with bed mobility.    Time 3   Period Weeks   Status New           PT Long Term Goals - 03/02/17 1157       PT LONG TERM GOAL #1   Title Pt will demo improved BLE strength to atleast 4+/5 MMT, which will increase his safety with daily activity.    Time 6   Period Weeks   Status New   Target Date 04/13/17     PT LONG TERM GOAL #2   Title Pt will complete 5x sit to stand in less than 14 sec, to reflect improvements in his functional strength and activity tolerance.    Time 6   Period Weeks   Status New     PT LONG TERM GOAL #3   Title Pt will complete the TUG in less than 14 sec, to reflect improvements in gait speed and functional balance with return to activity in the community.    Time 6   Period Weeks   Status New     PT LONG TERM GOAL #4   Title Pt will report atleast 50% improvement in his symptoms since the start of PT, to allow him to complete houshold activity without significant difficulty and improve his quality of life.    Time 6   Period Weeks   Status New     PT LONG TERM GOAL #5   Title Pt will maintain single leg balance on each LE for atleast 20 sec, 2/3 trials, without LOB or [place] deviation, to reflect improvements in his trunk and LE stability.    Time 6   Period Weeks   Status New               Plan - 03/30/17 1435    Clinical Impression Statement Pt reports no changed in medication following apt with back docter earlier today.  Pt reports he is semi-compliant with HEP mainly dependent upon how back feels.  Pt continues to demonstrate hypersensitivity to lower back with noted muslce spasm to paraspinals and MFR restrictions over incision with increased guarding and anxiety related to lower back region.  Pt presents with severe lumbothoracic hypomobiltiy and excessive lordotic curvature and weakness of the trunk flexors.  Pt educated on importance of core strengthening to assist with back pain and encouraged to increase compliance with HEP for maximal benefits.  Moderate cueing for stabilty and control with tasks.  EOS pt reports pain scale range  from 4-5/10.     Rehab Potential Good   PT Frequency 2x /  week   PT Duration 6 weeks   PT Treatment/Interventions ADLs/Self Care Home Management;Electrical Stimulation;Moist Heat;Balance training;Therapeutic exercise;Therapeutic activities;Gait training;Neuromuscular re-education;Patient/family education;Manual techniques;Passive range of motion;Dry needling   PT Next Visit Plan Reassess next session.  Continue PT POC    PT Home Exercise Plan log roll technique, SKTC 10x5" each, supine low trunk rotation x10 reps each side, prone on elbows x5 min AM/PM       Patient will benefit from skilled therapeutic intervention in order to improve the following deficits and impairments:  Abnormal gait, Decreased activity tolerance, Decreased balance, Difficulty walking, Impaired flexibility, Hypomobility, Decreased strength, Decreased range of motion, Decreased endurance, Decreased mobility, Decreased scar mobility, Increased muscle spasms, Postural dysfunction, Pain, Improper body mechanics  Visit Diagnosis: Chronic midline low back pain, with sciatica presence unspecified  Other abnormalities of gait and mobility  Muscle spasm of back  Muscle weakness (generalized)     Problem List Patient Active Problem List   Diagnosis Date Noted  . History of colonic polyps   . Diverticulosis of colon without hemorrhage   . ST-segment elevation myocardial infarction (STEMI) of inferior wall ([place]) 03/14/2014  . Presence of bare metal stent in right coronary artery: VeriFlex BMS 5.0 mm x 15 mm (5.6 mm) 03/14/2014    Class: Status post  . CAD S/P percutaneous coronary angioplasty 03/14/2014  . Chest pain 03/13/2014  . Pancreatitis 01/19/2014  . Hyperlipidemia 01/19/2014  . Diabetes mellitus ([place]) 01/19/2014   Ihor Austin, [place]; [place]  Aldona Lento 03/30/2017, 2:50 PM  [place], [place], [address] Phone:  [phone]   Fax:  [phone]  Name: [name] MRN: [id number] Date of Birth: 07/26/1965

## 2017-04-04 ENCOUNTER — Ambulatory Visit (HOSPITAL_COMMUNITY): Payer: BLUE CROSS/BLUE SHIELD

## 2017-04-04 DIAGNOSIS — R2689 Other abnormalities of gait and mobility: Secondary | ICD-10-CM

## 2017-04-04 DIAGNOSIS — M545 Low back pain: Principal | ICD-10-CM

## 2017-04-04 DIAGNOSIS — M62830 Muscle spasm of back: Secondary | ICD-10-CM

## 2017-04-04 DIAGNOSIS — G8929 Other chronic pain: Secondary | ICD-10-CM

## 2017-04-04 DIAGNOSIS — M6281 Muscle weakness (generalized): Secondary | ICD-10-CM

## 2017-04-04 NOTE — Therapy (Signed)
[place], [place], [address] Phone: [phone]   Fax:  [phone]  Physical Therapy Re-Assesment  Patient Details  Name: [name] MRN: [id number] Date of Birth: April 12, 1966 Referring Provider: Consuella Lose, MD   Encounter Date: 04/04/2017      PT End of Session - 04/04/17 1136    Visit Number 6   Number of Visits 13   Date for PT Re-Evaluation 03/23/17   Authorization Type BCBS   Authorization Time Period 03/02/17 to 04/13/17 inititally; 04/04/17-05/04/17   Authorization - Number of Visits 30   PT Start Time 1115   PT Stop Time 1200   PT Time Calculation (min) 45 min   Activity Tolerance Patient tolerated treatment well;Patient limited by pain;Patient limited by fatigue   Behavior During Therapy [facility] for tasks assessed/performed      Past Medical History:  Diagnosis Date  . Anxiety   . Borderline diabetes   . Bulging disc   . Chronic back pain   . Diabetes mellitus without complication ([place])   . Hypercholesteremia   . Pancreatitis     Past Surgical History:  Procedure Laterality Date  . ABDOMINAL SURGERY    . APPENDECTOMY    . BACK SURGERY    . CARDIAC CATHETERIZATION  2003   "trivial coronary artery disease," normal EF  . COLONOSCOPY N/A 04/16/2015   Procedure: COLONOSCOPY;  Surgeon: Daneil Dolin, MD;  [facility];  Service: Endoscopy;  Laterality: N/A;  12:45 PM  . LEFT HEART CATHETERIZATION WITH CORONARY ANGIOGRAM N/A 03/13/2014   Procedure: LEFT HEART CATHETERIZATION WITH CORONARY ANGIOGRAM;  Surgeon: Leonie Man, MD;  Location[facility] CATH LAB;  Service: Cardiovascular;  Laterality: N/A;    There were no vitals filed for this visit.       Subjective Assessment - 04/04/17 1117    Subjective Pt reports he has been taking his new med for BP sicne last visit. No lightheadedness or dizziness. Still has CP occasionally. HEP when able.    Pertinent History Low back surgery  2018 and ~2 years prior in the same location (per pt), anxiety, chronic back pain, MI 2-3 years ago    Limitations Other (comment)   How long can you sit comfortably? About 1 hour   How long can you stand comfortably? 10-15 minutes, legs begin to shake    How long can you walk comfortably? 25-30 minutes for grocery trips    Patient Stated Goals improve pain    Currently in Pain? Yes   Pain Score 4    Pain Location --  rt paraspinals    Pain Descriptors / Indicators Aching   Pain Type Chronic pain   Pain Onset More than a month ago   Pain Frequency Constant            [facility] PT Assessment - 04/04/17 0001      Strength   Right Hip Flexion 4+/5   Right Hip External Rotation  3+/5   Right Hip Internal Rotation 4-/5   Right Hip ABduction --  hor abdct: 4+   Left Hip Flexion 4+/5   Left Hip External Rotation 4-/5   Left Hip Internal Rotation 4-/5   Left Hip ABduction --  hor abdct: 4+   Right Knee Flexion 3+/5   Right Knee Extension 4-/5   Left Knee Flexion 4/5   Left Knee Extension 4+/5  range limited     Transfers   Five time  sit to stand comments  27  was 27 at eval     Standardized Balance Assessment   Standardized Balance Assessment Timed Up and Go Test     Timed Up and Go Test   TUG Comments 19s  was 24 Sec     High Level Balance   High Level Balance Comments single leg stance: Rt 13 sec, Lt 9 sec   at eval single leg stance: Rt 10 sec, Lt 12 sec             Objective measurements completed on examination: See above findings.          [place] Adult PT Treatment/Exercise - 04/04/17 0001      Lumbar Exercises: Stretches   Single Knee to Chest Stretch 1 rep;30 seconds  bilat (HEP education)    Double Knee to Chest Stretch 1 rep;30 seconds   (HEP education)    Double Knee to Chest Stretch Limitations 20x3secH    (HEP education)    Piriformis Stretch Limitations supine FABER stretch 1x30sec bilat  (HEP education)      Lumbar Exercises: Aerobic    Stationary Bike NuStep performed at end of session: 10 minutes   arms 10, seat 10, level 1     Lumbar Exercises: Seated   Long Arc Quad on Chair Both;Strengthening;2 sets;10 reps   LAQ on Chair Weights (lbs) 5lb   Sit to Stand Limitations trunk flexion stretch: 1x30sec  (HEP education)      Lumbar Exercises: Supine   Other Supine Lumbar Exercises supine DKTC: 1x10  (HEP education)                 PT Education - 04/04/17 1136    Education provided Yes   Education Details importance of moving again to regain function.    Person(s) Educated Patient   Methods Explanation   Comprehension Verbalized understanding          PT Short Term Goals - 04/04/17 1150      PT SHORT TERM GOAL #1   Title Pt will demo consistency and independence with his HEP to decrease pain and improve mobility.    Time 4   Status Revised  revised HEP today    Target Date 05/04/17     PT SHORT TERM GOAL #2   Title Pt will demo proper log roll technique during his session, with no more than verbal cues from therapist, to decrease lumbar strain and pain with bed mobility.    Status Achieved           PT Long Term Goals - 04/04/17 1153      PT LONG TERM GOAL #1   Title Pt will demo improved BLE strength to atleast 4+/5 MMT, which will increase his safety with daily activity.    Status On-going   Target Date 05/04/17     PT LONG TERM GOAL #2   Title Pt will complete 5x sit to stand in less than 14 sec, to reflect improvements in his functional strength and activity tolerance.    Baseline 17s at eval, 17sec at reassessment    Status On-going     PT LONG TERM GOAL #3   Title Pt will complete the TUG in less than 14 sec, to reflect improvements in gait speed and functional balance with return to activity in the community.    Baseline 24s at eval, 17sec at reassess   Status On-going     PT LONG TERM GOAL #4  Title Pt will report atleast 50% improvement in his symptoms since the start of PT,  to allow him to complete houshold activity without significant difficulty and improve his quality of life.    Status Unable to assess     PT LONG TERM GOAL #5   Title Pt will maintain single leg balance on each LE for atleast 20 sec, 2/3 trials, without LOB or [place] deviation, to reflect improvements in his trunk and LE stability.    Baseline grossly unchanged compared to eval, has not yet been alarge focus.    Status On-going                Plan - 04/04/17 1139    Clinical Impression Statement Reassessment performed this date. Pt demonstrating improvement objectively in a few areas, and no improvement in other. Unfortunatley patient has missed several visists since evaluation and has only bee able to make 5 appointments since. Moving forward, focus will move more heavily toward education on suspected kinesiophobia, addrtessing central sensitization, global LE strengthening, and gradually growing tolerance to noramal ADL/IADL funcitonal activiity andmobility.    Rehab Potential Good   PT Frequency 2x / week   PT Duration 4 weeks   PT Treatment/Interventions ADLs/Self Care Home Management;Electrical Stimulation;Moist Heat;Balance training;Therapeutic exercise;Therapeutic activities;Gait training;Neuromuscular re-education;Patient/family education;Manual techniques;Passive range of motion;Dry needling   PT Next Visit Plan Review new HEP in full next session    PT [place] original HEP; on 9/25: SKTC2x30 bilat, DKTC 2x30sec, DKTC rocking 1x20, supine FABER stretch 2x30sec bilat, reverse curl-up 1x10, seated chair flexion stretch 2x30sec, Sit to stand 1x10, Marching in place 16x alternating.   Consulted and Agree with Plan of Care Patient      Patient will benefit from skilled therapeutic intervention in order to improve the following deficits and impairments:  Abnormal gait, Decreased activity tolerance, Decreased balance, Difficulty walking, Impaired flexibility,  Hypomobility, Decreased strength, Decreased range of motion, Decreased endurance, Decreased mobility, Decreased scar mobility, Increased muscle spasms, Postural dysfunction, Pain, Improper body mechanics  Visit Diagnosis: Chronic midline low back pain, with sciatica presence unspecified  Other abnormalities of gait and mobility  Muscle spasm of back  Muscle weakness (generalized)     Problem List Patient Active Problem List   Diagnosis Date Noted  . History of colonic polyps   . Diverticulosis of colon without hemorrhage   . ST-segment elevation myocardial infarction (STEMI) of inferior wall ([place]) 03/14/2014  . Presence of bare metal stent in right coronary artery: VeriFlex BMS 5.0 mm x 15 mm (5.6 mm) 03/14/2014    Class: Status post  . CAD S/P percutaneous coronary angioplasty 03/14/2014  . Chest pain 03/13/2014  . Pancreatitis 01/19/2014  . Hyperlipidemia 01/19/2014  . Diabetes mellitus ([place]) 01/19/2014    12:08 PM, 04/04/17 Etta Grandchild, PT, DPT Physical Therapist at [facility] [phone] (office)      Etta Grandchild 04/04/2017, 12:08 PM  [place] [address] [place], [place], [address] Phone: [phone]   Fax:  [phone]  Name: MENASHE KAFER MRN: [id number] Date of Birth: Jun 09, 1966

## 2017-04-06 ENCOUNTER — Ambulatory Visit (HOSPITAL_COMMUNITY): Payer: BLUE CROSS/BLUE SHIELD | Admitting: Physical Therapy

## 2017-04-06 ENCOUNTER — Telehealth (HOSPITAL_COMMUNITY): Payer: Self-pay | Admitting: Physical Therapy

## 2017-04-06 NOTE — Telephone Encounter (Signed)
Called pt about his appointment.  Pt states that he just got out of bed.  States that he will be at his next appointment.  Rayetta Humphrey, [place] CLT [phone]

## 2017-04-11 ENCOUNTER — Encounter (HOSPITAL_COMMUNITY): Payer: Self-pay

## 2017-04-11 ENCOUNTER — Ambulatory Visit (HOSPITAL_COMMUNITY): Payer: BLUE CROSS/BLUE SHIELD | Attending: Neurosurgery

## 2017-04-11 DIAGNOSIS — M545 Low back pain: Secondary | ICD-10-CM | POA: Insufficient documentation

## 2017-04-11 DIAGNOSIS — M6281 Muscle weakness (generalized): Secondary | ICD-10-CM

## 2017-04-11 DIAGNOSIS — M62830 Muscle spasm of back: Secondary | ICD-10-CM | POA: Insufficient documentation

## 2017-04-11 DIAGNOSIS — G8929 Other chronic pain: Secondary | ICD-10-CM | POA: Insufficient documentation

## 2017-04-11 DIAGNOSIS — R2689 Other abnormalities of gait and mobility: Secondary | ICD-10-CM | POA: Insufficient documentation

## 2017-04-11 NOTE — Therapy (Signed)
[place], [place], [address] Phone: [phone]   Fax:  [phone]  Physical Therapy Treatment  Patient Details  Name: [name] MRN: [id number] Date of Birth: 04-Aug-1965 Referring Provider: Consuella Lose, MD   Encounter Date: 04/11/2017      PT End of Session - 04/11/17 1132    Visit Number 7   Number of Visits 13   Date for PT Re-Evaluation 05/04/17   Authorization Type BCBS   Authorization Time Period 03/02/17 to 04/13/17 inititally; 04/04/17-05/04/17   Authorization - Number of Visits 30   PT Start Time 6962   PT Stop Time 1203   PT Time Calculation (min) 38 min   Activity Tolerance Patient tolerated treatment well;No increased pain;Patient limited by fatigue   Behavior During Therapy [facility] for tasks assessed/performed      Past Medical History:  Diagnosis Date  . Anxiety   . Borderline diabetes   . Bulging disc   . Chronic back pain   . Diabetes mellitus without complication ([place])   . Hypercholesteremia   . Pancreatitis     Past Surgical History:  Procedure Laterality Date  . ABDOMINAL SURGERY    . APPENDECTOMY    . BACK SURGERY    . CARDIAC CATHETERIZATION  2003   "trivial coronary artery disease," normal EF  . COLONOSCOPY N/A 04/16/2015   Procedure: COLONOSCOPY;  Surgeon: Daneil Dolin, MD;  [facility];  Service: Endoscopy;  Laterality: N/A;  12:45 PM  . LEFT HEART CATHETERIZATION WITH CORONARY ANGIOGRAM N/A 03/13/2014   Procedure: LEFT HEART CATHETERIZATION WITH CORONARY ANGIOGRAM;  Surgeon: Leonie Man, MD;  Location: [facility] CATH LAB;  Service: Cardiovascular;  Laterality: N/A;    There were no vitals filed for this visit.      Subjective Assessment - 04/11/17 1129    Subjective Pt reports compliance with HEP.  Curretn pain scale 3.5/10 LBP Rt sided dull soreness.   Pertinent History Low back surgery 2018 and ~2 years prior in the same location (per pt), anxiety,  chronic back pain, MI 2-3 years ago    Patient Stated Goals improve pain    Currently in Pain? Yes   Pain Score 4    Pain Location Back   Pain Orientation Lower;Right   Pain Descriptors / Indicators Sore;Aching   Pain Type Chronic pain   Pain Radiating Towards none   Pain Onset More than a month ago   Pain Frequency Constant   Aggravating Factors  being very active   Pain Relieving Factors unable to find relief, heat is helpful   Effect of Pain on Daily Activities severe                         [facility] Adult PT Treatment/Exercise - 04/11/17 0001      Bed Mobility   Bed Mobility Sit to Sidelying Left     Lumbar Exercises: Stretches   Single Knee to Chest Stretch 3 reps;30 seconds   Single Knee to Chest Stretch Limitations reviewed form with HEP   Double Knee to Chest Stretch 2 reps;30 seconds  cueing for form   Lower Trunk Rotation Limitations 10x 10" with ab set   Piriformis Stretch 2 reps;30 seconds   Piriformis Stretch Limitations supine FABER stretch     Lumbar Exercises: Standing   Other Standing Lumbar Exercises marching no HHA 15x alternating with 3" holds     Lumbar Exercises:  Seated   Sit to Stand 10 reps  eccentric control     Lumbar Exercises: Supine   Other Supine Lumbar Exercises reverse crunch DKTC 2 sets x 10 reps   Other Supine Lumbar Exercises supine DKTC stretch 2x 30"                  PT Short Term Goals - 04/04/17 1150      PT SHORT TERM GOAL #1   Title Pt will demo consistency and independence with his HEP to decrease pain and improve mobility.    Time 4   Status Revised  revised HEP today    Target Date 05/04/17     PT SHORT TERM GOAL #2   Title Pt will demo proper log roll technique during his session, with no more than verbal cues from therapist, to decrease lumbar strain and pain with bed mobility.    Status Achieved           PT Long Term Goals - 04/04/17 1153      PT LONG TERM GOAL #1   Title Pt will  demo improved BLE strength to atleast 4+/5 MMT, which will increase his safety with daily activity.    Status On-going   Target Date 05/04/17     PT LONG TERM GOAL #2   Title Pt will complete 5x sit to stand in less than 14 sec, to reflect improvements in his functional strength and activity tolerance.    Baseline 17s at eval, 17sec at reassessment    Status On-going     PT LONG TERM GOAL #3   Title Pt will complete the TUG in less than 14 sec, to reflect improvements in gait speed and functional balance with return to activity in the community.    Baseline 24s at eval, 17sec at reassess   Status On-going     PT LONG TERM GOAL #4   Title Pt will report atleast 50% improvement in his symptoms since the start of PT, to allow him to complete houshold activity without significant difficulty and improve his quality of life.    Status Unable to assess     PT LONG TERM GOAL #5   Title Pt will maintain single leg balance on each LE for atleast 20 sec, 2/3 trials, without LOB or [place] deviation, to reflect improvements in his trunk and LE stability.    Baseline grossly unchanged compared to eval, has not yet been alarge focus.    Status On-going               Plan - 04/11/17 1156    Clinical Impression Statement Reviewed complaince and form with new HEP.  Pt able to recall current HEP though does require cueing for form.  Pt demonstrates bad mechanics with bed mobility, pt educated on importance of compliance iwht bed mobility to reduce strain for pain control, able to complete prior mechanics with minimal cueng.  EOS pt reports pain reduced to 3/10.     Rehab Potential Good   PT Frequency 2x / week   PT Duration 4 weeks   PT Treatment/Interventions ADLs/Self Care Home Management;Electrical Stimulation;Moist Heat;Balance training;Therapeutic exercise;Therapeutic activities;Gait training;Neuromuscular re-education;Patient/family education;Manual techniques;Passive range of  motion;Dry needling   PT Next Visit Plan Next session progress functional strengthening with focus on gluteal to improve STS.  Trial with wall slides and continue core strengthening towards goals.   PT Home Exercise Plan DC original HEP; on 9/25: SKTC2x30 bilat, DKTC 2x30sec, DKTC rocking 1x20,  supine FABER stretch 2x30sec bilat, reverse curl-up 1x10, seated chair flexion stretch 2x30sec, Sit to stand 1x10, Marching in place 16x alternating.   Consulted and Agree with Plan of Care Patient      Patient will benefit from skilled therapeutic intervention in order to improve the following deficits and impairments:  Abnormal gait, Decreased activity tolerance, Decreased balance, Difficulty walking, Impaired flexibility, Hypomobility, Decreased strength, Decreased range of motion, Decreased endurance, Decreased mobility, Decreased scar mobility, Increased muscle spasms, Postural dysfunction, Pain, Improper body mechanics  Visit Diagnosis: Chronic midline low back pain, with sciatica presence unspecified  Other abnormalities of gait and mobility  Muscle spasm of back  Muscle weakness (generalized)     Problem List Patient Active Problem List   Diagnosis Date Noted  . History of colonic polyps   . Diverticulosis of colon without hemorrhage   . ST-segment elevation myocardial infarction (STEMI) of inferior wall ([place]) 03/14/2014  . Presence of bare metal stent in right coronary artery: VeriFlex BMS 5.0 mm x 15 mm (5.6 mm) 03/14/2014    Class: Status post  . CAD S/P percutaneous coronary angioplasty 03/14/2014  . Chest pain 03/13/2014  . Pancreatitis 01/19/2014  . Hyperlipidemia 01/19/2014  . Diabetes mellitus ([place]) 01/19/2014   Ihor Austin, [place]; [place]  Aldona Lento 04/11/2017, 12:05 PM  [place], [place], [address] Phone: [phone]   Fax:  [phone]  Name: [name] MRN:  [id number] Date of Birth: 08/04/1965

## 2017-04-13 ENCOUNTER — Ambulatory Visit (HOSPITAL_COMMUNITY): Payer: BLUE CROSS/BLUE SHIELD | Admitting: Physical Therapy

## 2017-04-13 ENCOUNTER — Telehealth (HOSPITAL_COMMUNITY): Payer: Self-pay | Admitting: Physical Therapy

## 2017-04-13 NOTE — Telephone Encounter (Signed)
No-show. Called and spoke to patient, who apologizes and states he just got out of bed/overslept. He plans to be at his appointment on Tuesday.  Deniece Ree PT, DPT [phone]

## 2017-04-18 ENCOUNTER — Encounter (HOSPITAL_COMMUNITY): Payer: Self-pay

## 2017-04-18 ENCOUNTER — Ambulatory Visit (HOSPITAL_COMMUNITY): Payer: BLUE CROSS/BLUE SHIELD

## 2017-04-18 DIAGNOSIS — M62830 Muscle spasm of back: Secondary | ICD-10-CM

## 2017-04-18 DIAGNOSIS — R2689 Other abnormalities of gait and mobility: Secondary | ICD-10-CM

## 2017-04-18 DIAGNOSIS — M6281 Muscle weakness (generalized): Secondary | ICD-10-CM

## 2017-04-18 DIAGNOSIS — M545 Low back pain: Principal | ICD-10-CM

## 2017-04-18 DIAGNOSIS — G8929 Other chronic pain: Secondary | ICD-10-CM

## 2017-04-18 NOTE — Therapy (Signed)
[place], [place], [address] Phone: [phone]   Fax:  [phone]  Physical Therapy Treatment  Patient Details  Name: [name] MRN: [id number] Date of Birth: 1966/01/26 Referring Provider: Consuella Lose, MD   Encounter Date: 04/18/2017      PT End of Session - 04/18/17 1145    Visit Number 8   Number of Visits 13   Date for PT Re-Evaluation 05/04/17   Authorization Type BCBS   Authorization Time Period 03/02/17 to 04/13/17 inititally; 04/04/17-05/04/17   Authorization - Number of Visits 30   PT Start Time 1139  therapist late for apt today   PT Stop Time 1218   PT Time Calculation (min) 39 min   Activity Tolerance Patient tolerated treatment well;No increased pain;Patient limited by fatigue   Behavior During Therapy [facility] for tasks assessed/performed      Past Medical History:  Diagnosis Date  . Anxiety   . Borderline diabetes   . Bulging disc   . Chronic back pain   . Diabetes mellitus without complication ([place])   . Hypercholesteremia   . Pancreatitis     Past Surgical History:  Procedure Laterality Date  . ABDOMINAL SURGERY    . APPENDECTOMY    . BACK SURGERY    . CARDIAC CATHETERIZATION  2003   "trivial coronary artery disease," normal EF  . COLONOSCOPY N/A 04/16/2015   Procedure: COLONOSCOPY;  Surgeon: Daneil Dolin, MD;  [facility];  Service: Endoscopy;  Laterality: N/A;  12:45 PM  . LEFT HEART CATHETERIZATION WITH CORONARY ANGIOGRAM N/A 03/13/2014   Procedure: LEFT HEART CATHETERIZATION WITH CORONARY ANGIOGRAM;  Surgeon: Leonie Man, MD;  Location: [facility] CATH LAB;  Service: Cardiovascular;  Laterality: N/A;    There were no vitals filed for this visit.      Subjective Assessment - 04/18/17 1143    Subjective Pt stated LBP continues 2-3/10 LBP center and Rt sided intermittent dull pain   Pertinent History Low back surgery 2018 and ~2 years prior in the same location  (per pt), anxiety, chronic back pain, MI 2-3 years ago    Patient Stated Goals improve pain    Currently in Pain? Yes   Pain Score 3    Pain Location Back   Pain Orientation Lower;Right   Pain Descriptors / Indicators Dull;Sore;Aching   Pain Type Chronic pain   Pain Onset More than a month ago   Pain Frequency Intermittent   Aggravating Factors  being very active   Pain Relieving Factors unable to find relief, heat is helpful   Effect of Pain on Daily Activities severe                         [facility] Adult PT Treatment/Exercise - 04/18/17 0001      Bed Mobility   Bed Mobility Sit to Sidelying Left     Lumbar Exercises: Stretches   Active Hamstring Stretch 30 seconds;2 reps   Active Hamstring Stretch Limitations supine   Double Knee to Chest Stretch 2 reps;30 seconds   Lower Trunk Rotation Limitations 10x 10" with ab set     Lumbar Exercises: Standing   Wall Slides 5 reps;3 seconds   Wall Slides Limitations within range   Other Standing Lumbar Exercises marching no HHA 15x alternating with 3" holds   Other Standing Lumbar Exercises Rockerboard R/L 2 min     Lumbar Exercises: Seated   Sit  to Stand 10 reps  eccentric control     Lumbar Exercises: Supine   Other Supine Lumbar Exercises reverse crunch DKTC 2 sets x 10 reps   Other Supine Lumbar Exercises supine DKTC stretch 2x 30"                  PT Short Term Goals - 04/04/17 1150      PT SHORT TERM GOAL #1   Title Pt will demo consistency and independence with his HEP to decrease pain and improve mobility.    Time 4   Status Revised  revised HEP today    Target Date 05/04/17     PT SHORT TERM GOAL #2   Title Pt will demo proper log roll technique during his session, with no more than verbal cues from therapist, to decrease lumbar strain and pain with bed mobility.    Status Achieved           PT Long Term Goals - 04/04/17 1153      PT LONG TERM GOAL #1   Title Pt will demo improved  BLE strength to atleast 4+/5 MMT, which will increase his safety with daily activity.    Status On-going   Target Date 05/04/17     PT LONG TERM GOAL #2   Title Pt will complete 5x sit to stand in less than 14 sec, to reflect improvements in his functional strength and activity tolerance.    Baseline 17s at eval, 17sec at reassessment    Status On-going     PT LONG TERM GOAL #3   Title Pt will complete the TUG in less than 14 sec, to reflect improvements in gait speed and functional balance with return to activity in the community.    Baseline 24s at eval, 17sec at reassess   Status On-going     PT LONG TERM GOAL #4   Title Pt will report atleast 50% improvement in his symptoms since the start of PT, to allow him to complete houshold activity without significant difficulty and improve his quality of life.    Status Unable to assess     PT LONG TERM GOAL #5   Title Pt will maintain single leg balance on each LE for atleast 20 sec, 2/3 trials, without LOB or [place] deviation, to reflect improvements in his trunk and LE stability.    Baseline grossly unchanged compared to eval, has not yet been alarge focus.    Status On-going               Plan - 04/18/17 1823    Clinical Impression Statement Pt continues to demostrate bad mechanics with bed mobility, able to verbalize though continues to demostrate techniques that increase LBP.  Progressed this session with gluteal strengthening and education on importance of posture and gait training.  Added rocker board to improve weight distrubution with gait.     Rehab Potential Good   PT Frequency 2x / week   PT Duration 4 weeks   PT Treatment/Interventions ADLs/Self Care Home Management;Electrical Stimulation;Moist Heat;Balance training;Therapeutic exercise;Therapeutic activities;Gait training;Neuromuscular re-education;Patient/family education;Manual techniques;Passive range of motion;Dry needling   PT Next Visit Plan focus will  move more heavily toward education on suspected kinesiophobia, addrtessing central sensitization, global LE strengthening, and gradually growing tolerance to noramal ADL/IADL funcitonal activiity and mobility.    PT Home Exercise Plan DC original HEP; on 9/25: SKTC2x30 bilat, DKTC 2x30sec, DKTC rocking 1x20, supine FABER stretch 2x30sec bilat, reverse curl-up 1x10, seated chair flexion stretch  2x30sec, Sit to stand 1x10, Marching in place 16x alternating.      Patient will benefit from skilled therapeutic intervention in order to improve the following deficits and impairments:  Abnormal gait, Decreased activity tolerance, Decreased balance, Difficulty walking, Impaired flexibility, Hypomobility, Decreased strength, Decreased range of motion, Decreased endurance, Decreased mobility, Decreased scar mobility, Increased muscle spasms, Postural dysfunction, Pain, Improper body mechanics  Visit Diagnosis: Chronic midline low back pain, with sciatica presence unspecified  Other abnormalities of gait and mobility  Muscle spasm of back  Muscle weakness (generalized)     Problem List Patient Active Problem List   Diagnosis Date Noted  . History of colonic polyps   . Diverticulosis of colon without hemorrhage   . ST-segment elevation myocardial infarction (STEMI) of inferior wall ([place]) 03/14/2014  . Presence of bare metal stent in right coronary artery: VeriFlex BMS 5.0 mm x 15 mm (5.6 mm) 03/14/2014    Class: Status post  . CAD S/P percutaneous coronary angioplasty 03/14/2014  . Chest pain 03/13/2014  . Pancreatitis 01/19/2014  . Hyperlipidemia 01/19/2014  . Diabetes mellitus ([place]) 01/19/2014   Ihor Austin, [place]; [place]  Aldona Lento 04/18/2017, 6:28 PM  [place] [address] [place], [place], [address] Phone: [phone]   Fax:  [phone]  Name: LUCAN RINER MRN: [id number] Date of Birth: 1966-06-03

## 2017-04-20 ENCOUNTER — Telehealth (HOSPITAL_COMMUNITY): Payer: Self-pay

## 2017-04-20 ENCOUNTER — Ambulatory Visit (HOSPITAL_COMMUNITY): Payer: BLUE CROSS/BLUE SHIELD

## 2017-04-20 NOTE — Telephone Encounter (Signed)
Patient cancel for today he is still in bed and don't feel like coming today.

## 2017-04-25 ENCOUNTER — Telehealth (HOSPITAL_COMMUNITY): Payer: Self-pay | Admitting: Physical Therapy

## 2017-04-25 ENCOUNTER — Ambulatory Visit (HOSPITAL_COMMUNITY): Payer: BLUE CROSS/BLUE SHIELD | Admitting: Physical Therapy

## 2017-04-25 NOTE — Telephone Encounter (Signed)
No-show; attempted to call but no answer and unable to leave voicemail.   Deniece Ree PT, DPT [phone]

## 2017-04-27 ENCOUNTER — Encounter (HOSPITAL_COMMUNITY): Payer: Self-pay | Admitting: Physical Therapy

## 2017-04-27 ENCOUNTER — Ambulatory Visit (HOSPITAL_COMMUNITY): Payer: BLUE CROSS/BLUE SHIELD | Admitting: Physical Therapy

## 2017-04-27 DIAGNOSIS — M545 Low back pain: Principal | ICD-10-CM

## 2017-04-27 DIAGNOSIS — M62830 Muscle spasm of back: Secondary | ICD-10-CM

## 2017-04-27 DIAGNOSIS — R2689 Other abnormalities of gait and mobility: Secondary | ICD-10-CM

## 2017-04-27 DIAGNOSIS — G8929 Other chronic pain: Secondary | ICD-10-CM

## 2017-04-27 DIAGNOSIS — M6281 Muscle weakness (generalized): Secondary | ICD-10-CM

## 2017-04-27 NOTE — Therapy (Signed)
[place], [place], [address] Phone: [phone]   Fax:  [phone]  Physical Therapy Treatment (Discharge)  Patient Details  Name: [name] MRN: [id number] Date of Birth: September 06, 1965 Referring Provider: Consuella Lose   Encounter Date: 04/27/2017   PHYSICAL THERAPY DISCHARGE SUMMARY  Visits from Start of Care: 9  Current functional level related to goals / functional outcomes: DC today due to lack of progress- recommend return to MD for further POC.    Remaining deficits: See below    Education / Equipment: See below  Plan: Patient agrees to discharge.  Patient goals were partially met. Patient is being discharged due to lack of progress.  ?????           PT End of Session - 04/27/17 1147    Visit Number 9   Number of Visits 13   Date for PT Re-Evaluation 05/04/17   Authorization Type BCBS   Authorization - Number of Visits 30   PT Start Time 1117   PT Stop Time 1144  DC today, not appropriate for further skilled PT services    PT Time Calculation (min) 27 min   Activity Tolerance Patient tolerated treatment well;Patient limited by pain   Behavior During Therapy [facility] for tasks assessed/performed      Past Medical History:  Diagnosis Date  . Anxiety   . Borderline diabetes   . Bulging disc   . Chronic back pain   . Diabetes mellitus without complication ([place])   . Hypercholesteremia   . Pancreatitis     Past Surgical History:  Procedure Laterality Date  . ABDOMINAL SURGERY    . APPENDECTOMY    . BACK SURGERY    . CARDIAC CATHETERIZATION  2003   "trivial coronary artery disease," normal EF  . COLONOSCOPY N/A 04/16/2015   Procedure: COLONOSCOPY;  Surgeon: Daneil Dolin, MD;  [facility];  Service: Endoscopy;  Laterality: N/A;  12:45 PM  . LEFT HEART CATHETERIZATION WITH CORONARY ANGIOGRAM N/A 03/13/2014   Procedure: LEFT HEART CATHETERIZATION WITH CORONARY ANGIOGRAM;   Surgeon: Leonie Man, MD;  Location: [facility] CATH LAB;  Service: Cardiovascular;  Laterality: N/A;    There were no vitals filed for this visit.      Subjective Assessment - 04/27/17 1120    Subjective Patient arrives stating that he continues to experience low back pain, it has not gotten much better and it is still that dull pain.  He does have pain shooting down his legs. It can be difficult to get comfortable.    Pertinent History Low back surgery 2018 and ~2 years prior in the same location (per pt), anxiety, chronic back pain, MI 2-3 years ago    How long can you sit comfortably? 10/18- immediate discomfort    How long can you stand comfortably? 10/18- 10-15 minutes    How long can you walk comfortably? 10/18- 30 minutes to go to the grocery store    Patient Stated Goals improve pain    Currently in Pain? Yes   Pain Score 4    Pain Location Back   Pain Orientation Right;Lower;Left   Pain Descriptors / Indicators Dull;Aching   Pain Type Chronic pain   Pain Radiating Towards down both legs    Pain Onset More than a month ago   Pain Frequency Constant   Aggravating Factors  doing too much of anything, being active    Pain Relieving Factors heat  Effect of Pain on Daily Activities severe            [facility] PT Assessment - 04/27/17 0001      Assessment   Medical Diagnosis LBP   Referring Provider Consuella Lose    Onset Date/Surgical Date --  May 2018   Next MD Visit not scheduled   Prior Therapy none      Precautions   Precautions Back     Balance Screen   Has the patient fallen in the past 6 months No   Has the patient had a decrease in activity level because of a fear of falling?  No   Is the patient reluctant to leave their home because of a fear of falling?  No     Prior Function   Level of Independence Independent   Vocation On disability     Strength   Right Hip Flexion 3+/5   Right Hip ABduction 3+/5   Left Hip Flexion 3+/5   Left Hip ABduction 3/5    Right Knee Flexion 3+/5   Right Knee Extension 3+/5   Left Knee Flexion 4/5   Left Knee Extension 3+/5   Right Ankle Dorsiflexion 3/5   Left Ankle Dorsiflexion 3/5     Transfers   Five time sit to stand comments  34     Timed Up and Go Test   TUG Comments 16     High Level Balance   High Level Balance Comments SLS 5-7 seconds B                              PT Education - 04/27/17 1147    Education provided Yes   Education Details exam findings, DC today; return to MD for further POC moving forward, continue with HEP    Person(s) Educated Patient   Methods Explanation   Comprehension Verbalized understanding          PT Short Term Goals - 04/27/17 1133      PT SHORT TERM GOAL #1   Title Pt will demo consistency and independence with his HEP to decrease pain and improve mobility.    Baseline 10/18- tries to do them, some are hard; 2-3 times/week    Time 4   Period Weeks   Status Partially Met     PT SHORT TERM GOAL #2   Title Pt will demo proper log roll technique during his session, with no more than verbal cues from therapist, to decrease lumbar strain and pain with bed mobility.    Baseline 10/18- achieved    Time 3   Period Weeks   Status Achieved           PT Long Term Goals - 04/27/17 1135      PT LONG TERM GOAL #1   Title Pt will demo improved BLE strength to atleast 4+/5 MMT, which will increase his safety with daily activity.    Baseline 10/18- MMT 3-4/5    Time 6   Period Weeks   Status On-going     PT LONG TERM GOAL #2   Title Pt will complete 5x sit to stand in less than 14 sec, to reflect improvements in his functional strength and activity tolerance.    Baseline 10/18- not met    Time 6   Period Weeks   Status On-going     PT LONG TERM GOAL #3   Title Pt will complete the TUG  in less than 14 sec, to reflect improvements in gait speed and functional balance with return to activity in the community.    Baseline  10/18- 16 seconds    Time 6   Period Weeks   Status On-going     PT LONG TERM GOAL #4   Title Pt will report atleast 50% improvement in his symptoms since the start of PT, to allow him to complete houshold activity without significant difficulty and improve his quality of life.    Baseline 10/18- no change    Time 6   Period Weeks   Status Not Met     PT LONG TERM GOAL #5   Title Pt will maintain single leg balance on each LE for atleast 20 sec, 2/3 trials, without LOB or [place] deviation, to reflect improvements in his trunk and LE stability.    Baseline 10/18 5-7 seconds    Time 6   Period Weeks   Status Not Met               Plan - 04/27/17 1149    Clinical Impression Statement Re-assessment performed today. Patient does not appear to have made progress towards his goals and actually scores lower on many objective measures as compared to last re-assessment. Note that he has multiple cancellations and has attended only a limited number of sessions over the past 1.5-2 months. Recommend DC today due to lack of progress.    Rehab Potential Good   PT Next Visit Plan DC today    PT Home Exercise Plan DC original HEP; on 9/25: SKTC2x30 bilat, DKTC 2x30sec, DKTC rocking 1x20, supine FABER stretch 2x30sec bilat, reverse curl-up 1x10, seated chair flexion stretch 2x30sec, Sit to stand 1x10, Marching in place 16x alternating.   Consulted and Agree with Plan of Care Patient      Patient will benefit from skilled therapeutic intervention in order to improve the following deficits and impairments:  Abnormal gait, Decreased activity tolerance, Decreased balance, Difficulty walking, Impaired flexibility, Hypomobility, Decreased strength, Decreased range of motion, Decreased endurance, Decreased mobility, Decreased scar mobility, Increased muscle spasms, Postural dysfunction, Pain, Improper body mechanics  Visit Diagnosis: Chronic midline low back pain, with sciatica presence  unspecified  Other abnormalities of gait and mobility  Muscle spasm of back  Muscle weakness (generalized)     Problem List Patient Active Problem List   Diagnosis Date Noted  . History of colonic polyps   . Diverticulosis of colon without hemorrhage   . ST-segment elevation myocardial infarction (STEMI) of inferior wall ([place]) 03/14/2014  . Presence of bare metal stent in right coronary artery: VeriFlex BMS 5.0 mm x 15 mm (5.6 mm) 03/14/2014    Class: Status post  . CAD S/P percutaneous coronary angioplasty 03/14/2014  . Chest pain 03/13/2014  . Pancreatitis 01/19/2014  . Hyperlipidemia 01/19/2014  . Diabetes mellitus ([place]) 01/19/2014    Deniece Ree PT, DPT [place] [address] [place], [place], [address] Phone: [phone]   Fax:  [phone]  Name: [name] MRN: [id number] Date of Birth: 12-03-1965

## 2017-05-03 ENCOUNTER — Ambulatory Visit (INDEPENDENT_AMBULATORY_CARE_PROVIDER_SITE_OTHER): Payer: BLUE CROSS/BLUE SHIELD | Admitting: Cardiology

## 2017-05-03 ENCOUNTER — Encounter: Payer: Self-pay | Admitting: Cardiology

## 2017-05-03 ENCOUNTER — Telehealth: Payer: Self-pay | Admitting: Cardiology

## 2017-05-03 VITALS — BP 106/78 | HR 76 | Ht 73.0 in | Wt 231.0 lb

## 2017-05-03 DIAGNOSIS — E782 Mixed hyperlipidemia: Secondary | ICD-10-CM

## 2017-05-03 DIAGNOSIS — I2510 Atherosclerotic heart disease of native coronary artery without angina pectoris: Secondary | ICD-10-CM

## 2017-05-03 DIAGNOSIS — R079 Chest pain, unspecified: Secondary | ICD-10-CM

## 2017-05-03 DIAGNOSIS — R9439 Abnormal result of other cardiovascular function study: Secondary | ICD-10-CM

## 2017-05-03 MED ORDER — ISOSORBIDE MONONITRATE ER 30 MG PO TB24: 30.0000 mg | ORAL_TABLET | Freq: Every day | ORAL | 1 refills | Status: DC

## 2017-05-03 MED ORDER — LISINOPRIL 2.5 MG PO TABS: 2.5000 mg | ORAL_TABLET | Freq: Every day | ORAL | 1 refills | Status: DC

## 2017-05-03 NOTE — Progress Notes (Signed)
Clinical Summary [name] is a 51 y.o.male seen today for follow up of the following medical problems.   1. CAD - admit 03/2014 with inferior STEMI, received BMS to RCA. LVEF 45-50% by LV gram. 02/2017 nuclear stress: mild apical ischemia vs artifact. Overall low risk - symptoms not improved after starting imdur  - pain in midchest radiating into left arm. Sharp pain, 4/10 in severity. Mainly with activity, better with rest. Better with SL NG. Worst with deep breathing. Better with NG. Increase in frequency since last visit. Pain now every other day.  - increase DOE, though limited by back as well   2. Hyperlipidemia - tolerates pravastatin, reports prior myaglias on a prior statins  - he remains compliant with statin, most recent labs with pcp  .  Past Medical History:  Diagnosis Date  . Anxiety   . Borderline diabetes   . Bulging disc   . Chronic back pain   . Diabetes mellitus without complication ([place])   . Hypercholesteremia   . Pancreatitis      No Known Allergies   Current Outpatient Prescriptions  Medication Sig Dispense Refill  . ALPRAZolam (XANAX) 1 MG tablet Take 1 mg by mouth 3 (three) times daily as needed for anxiety.     Marland Kitchen aspirin 81 MG chewable tablet Chew 1 tablet (81 mg total) by mouth daily.    . carvedilol (COREG) 3.125 MG tablet TAKE 1 TABLET(3.125 MG) BY MOUTH TWICE DAILY WITH A MEAL 60 tablet 0  . ibuprofen (ADVIL,MOTRIN) 200 MG tablet Take 400 mg by mouth every 6 (six) hours as needed.    . isosorbide mononitrate (IMDUR) 30 MG 24 hr tablet Take 0.5 tablets (15 mg total) by mouth daily. 45 tablet 0  . lisinopril (PRINIVIL,ZESTRIL) 2.5 MG tablet TAKE 1 TABLET BY MOUTH EVERY DAY 30 tablet 0  . nitroGLYCERIN (NITROSTAT) 0.4 MG SL tablet Place 1 tablet (0.4 mg total) under the tongue every 5 (five) minutes as needed for chest pain. 25 tablet 3  . oxyCODONE-acetaminophen (PERCOCET/ROXICET) 5-325 MG per tablet Take 1 tablet by mouth every 4 (four)  hours as needed for moderate pain or severe pain. 15 tablet 0  . pravastatin (PRAVACHOL) 40 MG tablet Take 1 tablet (40 mg total) by mouth daily. (Patient not taking: Reported on 03/02/2017) 90 tablet 3   No current facility-administered medications for this visit.      Past Surgical History:  Procedure Laterality Date  . ABDOMINAL SURGERY    . APPENDECTOMY    . BACK SURGERY    . CARDIAC CATHETERIZATION  2003   "trivial coronary artery disease," normal EF  . COLONOSCOPY N/A 04/16/2015   Procedure: COLONOSCOPY;  Surgeon: Daneil Dolin, MD;  [facility];  Service: Endoscopy;  Laterality: N/A;  12:45 PM  . LEFT HEART CATHETERIZATION WITH CORONARY ANGIOGRAM N/A 03/13/2014   Procedure: LEFT HEART CATHETERIZATION WITH CORONARY ANGIOGRAM;  Surgeon: Leonie Man, MD;  Location: [facility] CATH LAB;  Service: Cardiovascular;  Laterality: N/A;     No Known Allergies    Family History  Problem Relation Age of Onset  . Diabetes Father   . Hyperlipidemia Father   . Hypertension Father   . Hypertension Mother   . Hyperlipidemia Mother   . Hyperlipidemia Sister   . Hypertension Sister   . Diabetes Sister      Social History [name] reports that he has been smoking Cigarettes.  He started smoking about 39 years ago.  He has a 30.00 pack-year smoking history. He has never used smokeless tobacco. [name] reports that he drinks about 3.6 oz of alcohol per week .   Review of Systems CONSTITUTIONAL: No weight loss, fever, chills, weakness or fatigue.  HEENT: Eyes: No visual loss, blurred vision, double vision or yellow sclerae.No hearing loss, sneezing, congestion, runny nose or sore throat.  SKIN: No rash or itching.  CARDIOVASCULAR: per hpi RESPIRATORY: No shortness of breath, cough or sputum.  GASTROINTESTINAL: No anorexia, nausea, vomiting or diarrhea. No abdominal pain or blood.  GENITOURINARY: No burning on urination, no polyuria NEUROLOGICAL: No headache, dizziness, syncope,  paralysis, ataxia, numbness or tingling in the extremities. No change in bowel or bladder control.  MUSCULOSKELETAL: No muscle, back pain, joint pain or stiffness.  LYMPHATICS: No enlarged nodes. No history of splenectomy.  PSYCHIATRIC: No history of depression or anxiety.  ENDOCRINOLOGIC: No reports of sweating, cold or heat intolerance. No polyuria or polydipsia.  Marland Kitchen   Physical Examination Vitals:   05/03/17 1445  BP: 106/78  Pulse: 76  SpO2: 93%   Vitals:   05/03/17 1445  Weight: 231 lb (104.8 kg)  Height: 6\' 1"  (1.854 m)    Gen: resting comfortably, no acute distress HEENT: no scleral icterus, pupils equal round and reactive, no palptable cervical adenopathy,  CV: RRR, no m/r/g, no jvd Resp: Clear to auscultation bilaterally GI: abdomen is soft, non-tender, non-distended, normal bowel sounds, no hepatosplenomegaly MSK: extremities are warm, no edema.  Skin: warm, no rash Neuro:  no focal deficits Psych: appropriate affect   Diagnostic Studies 02/2017 nuclear stress  No diagnostic ST segment changes to indicate ischemia.  Small, mild intensity, apical to basal inferior defect is partially reversible and suggestive of ischemia, although there is gut radiotracer uptake artifact adjacent to the inferior wall on rest imaging which could also be contributing to the defect reversibility.  This is a low risk study.  Nuclear stress EF: 63%.    Assessment and Plan  1. CAD/Chest pain/Abnormal stress test -recent chest pain symptoms, prior nuclear stress with apical ischemia. Ongoing symptoms despite additional antianginal - we will refer for LHC. Increase imdur to 30mg  daily.   2. Hyperlipidemia Request results from pcp, continue statin   I have reviewed the risks, indications, and alternatives to cardiac catheterization, possible angioplasty, and stenting with the patient  today. Risks include but are not limited to bleeding, infection, vascular injury, stroke,  myocardial infection, arrhythmia, kidney injury, radiation-related injury in the case of prolonged fluoroscopy use, emergency cardiac surgery, and death. The patient understands the risks of serious complication is 1-2 in 1027 with diagnostic cardiac cath and 1-2% or less with angioplasty/stenting.       [name], M.D.

## 2017-05-03 NOTE — Telephone Encounter (Signed)
Pre-cert Verification for the following procedure   LHC 10/30 DR HARDING @ 1030 AM

## 2017-05-03 NOTE — Patient Instructions (Signed)
Your physician recommends that you schedule a follow-up appointment in: [place] has recommended you make the following change in your medication:   INCREASE IMDUR [place] [address] Dept: [phone] Loc: [place]  05/03/2017  You are scheduled for a HEART CATHETERIZATION Tuesday October 30TH WITH DR Guinda 1. Please arrive at the [facility] (Main Entrance A) at [facility]: [address] [place], [place] [address] at [place] (two hours before your procedure to ensure your preparation). Free valet parking service is available.   Special note: Every effort is made to have your procedure done on time. Please understand that emergencies sometimes delay scheduled procedures.  4. Medication instructions in preparation for your procedure:    Current Outpatient Prescriptions (Cardiovascular):  .  carvedilol (COREG) 3.125 MG tablet, TAKE 1 TABLET(3.125 MG) BY MOUTH TWICE DAILY WITH A MEAL .  isosorbide mononitrate (IMDUR) 30 MG 24 hr tablet, Take 1 tablet (30 mg total) by mouth daily. .  nitroGLYCERIN (NITROSTAT) 0.4 MG SL tablet, Place 1 tablet (0.4 mg total) under the tongue every 5 (five) minutes as needed for chest pain. .  pravastatin (PRAVACHOL) 40 MG tablet, Take 1 tablet (40 mg total) by mouth daily. Marland Kitchen  lisinopril (PRINIVIL,ZESTRIL) 2.5 MG tablet, Take 1 tablet (2.5 mg total) by mouth daily.   Current Outpatient Prescriptions (Analgesics):  .  aspirin 81 MG chewable tablet, Chew 1 tablet (81 mg total) by mouth daily. Marland Kitchen  ibuprofen (ADVIL,MOTRIN) 200 MG tablet, Take 400 mg by mouth every 6 (six) hours as needed. Marland Kitchen  oxyCODONE-acetaminophen (PERCOCET/ROXICET) 5-325 MG per tablet, Take 1 tablet by mouth every 4 (four) hours as needed for moderate pain or severe pain.   Current  Outpatient Prescriptions (Other):  Marland Kitchen  ALPRAZolam (XANAX) 1 MG tablet, Take 1 mg by mouth 3 (three) times daily as needed for anxiety.  *For reference purposes while preparing patient instructions.   Delete this med list prior to printing instructions for patient.*  On the morning of your procedure, take your ASPIRIN and any morning medicines NOT listed above.  You may use sips of water.  5. Plan for one night stay--bring personal belongings. 6. Bring a current list of your medications and current insurance cards. 7. You MUST have a responsible person to drive you home. 8. Someone MUST be with you the first 24 hours after you arrive home or your discharge will be delayed. 9. Please wear clothes that are easy to get on and off and wear slip-on shoes.  Thank you for allowing [place] to care for you!   -- [facility]

## 2017-05-04 ENCOUNTER — Telehealth: Payer: Self-pay | Admitting: Cardiology

## 2017-05-04 ENCOUNTER — Encounter: Payer: Self-pay | Admitting: *Deleted

## 2017-05-04 NOTE — Telephone Encounter (Signed)
Patient called asking if his note is ready to be picked up.

## 2017-05-04 NOTE — Telephone Encounter (Signed)
Per Dr. Harl Bowie, note will be ready for pickup tomorrow 10/26. Patient notified and verbalized understanding.

## 2017-05-05 ENCOUNTER — Other Ambulatory Visit: Payer: Self-pay | Admitting: Cardiology

## 2017-05-05 DIAGNOSIS — R0789 Other chest pain: Secondary | ICD-10-CM

## 2017-05-08 ENCOUNTER — Telehealth: Payer: Self-pay

## 2017-05-08 DIAGNOSIS — I209 Angina pectoris, unspecified: Secondary | ICD-10-CM | POA: Diagnosis present

## 2017-05-08 NOTE — Telephone Encounter (Signed)
No DPR on file.  Left generic message requesting call back.  This nurse name and # left.

## 2017-05-09 ENCOUNTER — Ambulatory Visit (HOSPITAL_COMMUNITY)
Admission: RE | Admit: 2017-05-09 | Discharge: 2017-05-09 | Disposition: A | Discharge status: Home / Self Care | Payer: BLUE CROSS/BLUE SHIELD | Source: Ambulatory Visit | Attending: Cardiology | Admitting: Cardiology

## 2017-05-09 ENCOUNTER — Encounter (HOSPITAL_COMMUNITY)
Admission: RE | Disposition: A | Discharge status: Home / Self Care | Payer: Self-pay | Source: Ambulatory Visit | Attending: Cardiology

## 2017-05-09 DIAGNOSIS — E785 Hyperlipidemia, unspecified: Secondary | ICD-10-CM | POA: Insufficient documentation

## 2017-05-09 DIAGNOSIS — I2510 Atherosclerotic heart disease of native coronary artery without angina pectoris: Secondary | ICD-10-CM | POA: Insufficient documentation

## 2017-05-09 DIAGNOSIS — E7800 Pure hypercholesterolemia, unspecified: Secondary | ICD-10-CM | POA: Insufficient documentation

## 2017-05-09 DIAGNOSIS — R9439 Abnormal result of other cardiovascular function study: Secondary | ICD-10-CM | POA: Insufficient documentation

## 2017-05-09 DIAGNOSIS — Z9861 Coronary angioplasty status: Secondary | ICD-10-CM

## 2017-05-09 DIAGNOSIS — E1169 Type 2 diabetes mellitus with other specified complication: Secondary | ICD-10-CM | POA: Diagnosis present

## 2017-05-09 DIAGNOSIS — Z7982 Long term (current) use of aspirin: Secondary | ICD-10-CM | POA: Insufficient documentation

## 2017-05-09 DIAGNOSIS — I209 Angina pectoris, unspecified: Secondary | ICD-10-CM | POA: Diagnosis present

## 2017-05-09 DIAGNOSIS — E119 Type 2 diabetes mellitus without complications: Secondary | ICD-10-CM | POA: Insufficient documentation

## 2017-05-09 DIAGNOSIS — F419 Anxiety disorder, unspecified: Secondary | ICD-10-CM | POA: Insufficient documentation

## 2017-05-09 DIAGNOSIS — F17210 Nicotine dependence, cigarettes, uncomplicated: Secondary | ICD-10-CM | POA: Insufficient documentation

## 2017-05-09 DIAGNOSIS — I252 Old myocardial infarction: Secondary | ICD-10-CM | POA: Insufficient documentation

## 2017-05-09 DIAGNOSIS — Z79899 Other long term (current) drug therapy: Secondary | ICD-10-CM | POA: Insufficient documentation

## 2017-05-09 DIAGNOSIS — G8929 Other chronic pain: Secondary | ICD-10-CM | POA: Insufficient documentation

## 2017-05-09 DIAGNOSIS — E782 Mixed hyperlipidemia: Secondary | ICD-10-CM | POA: Diagnosis present

## 2017-05-09 DIAGNOSIS — Z955 Presence of coronary angioplasty implant and graft: Secondary | ICD-10-CM

## 2017-05-09 DIAGNOSIS — R0789 Other chest pain: Secondary | ICD-10-CM

## 2017-05-09 HISTORY — PX: LEFT HEART CATH AND CORONARY ANGIOGRAPHY: CATH118249

## 2017-05-09 LAB — PROTIME-INR
INR: 0.92
Prothrombin Time: 12.3 seconds (ref 11.4–15.2)

## 2017-05-09 LAB — BASIC METABOLIC PANEL
ANION GAP: 7 (ref 5–15)
BUN: 10 mg/dL (ref 6–20)
CO2: 23 mmol/L (ref 22–32)
Calcium: 8.8 mg/dL — ABNORMAL LOW (ref 8.9–10.3)
Chloride: 103 mmol/L (ref 101–111)
Creatinine, Ser: 0.49 mg/dL — ABNORMAL LOW (ref 0.61–1.24)
GFR calc non Af Amer: >60 mL/min (ref >60)
GLUCOSE: 136 mg/dL — AB (ref 65–99)
Potassium: 5 mmol/L (ref 3.5–5.1)
Sodium: 133 mmol/L — ABNORMAL LOW (ref 135–145)

## 2017-05-09 LAB — CBC
HEMATOCRIT: 44.2 % (ref 39.0–52.0)
HEMOGLOBIN: 15.8 g/dL (ref 13.0–17.0)
MCH: 33.1 pg (ref 26.0–34.0)
MCHC: 35.7 g/dL (ref 30.0–36.0)
MCV: 92.7 fL (ref 78.0–100.0)
Platelets: 173 10*3/uL (ref 150–400)
RBC: 4.77 MIL/uL (ref 4.22–5.81)
RDW: 12.9 % (ref 11.5–15.5)
WBC: 10 10*3/uL (ref 4.0–10.5)

## 2017-05-09 LAB — GLUCOSE, CAPILLARY: Glucose-Capillary: 163 mg/dL — ABNORMAL HIGH (ref 65–99)

## 2017-05-09 SURGERY — LEFT HEART CATH AND CORONARY ANGIOGRAPHY
Anesthesia: LOCAL

## 2017-05-09 MED ORDER — FENTANYL CITRATE (PF) 100 MCG/2ML IJ SOLN
INTRAMUSCULAR | Status: DC | PRN
  Administered 2017-05-09: 25 ug via INTRAVENOUS
  Administered 2017-05-09: 50 ug via INTRAVENOUS

## 2017-05-09 MED ORDER — ASPIRIN 81 MG PO CHEW: 81.0000 mg | CHEWABLE_TABLET | ORAL | Status: DC

## 2017-05-09 MED ORDER — MIDAZOLAM HCL 2 MG/2ML IJ SOLN
INTRAMUSCULAR | Status: DC | PRN
  Administered 2017-05-09: 1 mg via INTRAVENOUS

## 2017-05-09 MED ORDER — SODIUM CHLORIDE 0.9 % IV SOLN
250.0000 mL | INTRAVENOUS | Status: DC | PRN
Start: 2017-05-09 — End: 2017-05-09

## 2017-05-09 MED ORDER — IOPAMIDOL (ISOVUE-370) INJECTION 76%
INTRAVENOUS | Status: DC | PRN
Start: 2017-05-09 — End: 2017-05-09
  Administered 2017-05-09: 70 mL via INTRA_ARTERIAL

## 2017-05-09 MED ORDER — FENTANYL CITRATE (PF) 100 MCG/2ML IJ SOLN
INTRAMUSCULAR | Status: AC
  Filled 2017-05-09: qty 2

## 2017-05-09 MED ORDER — ONDANSETRON HCL 4 MG/2ML IJ SOLN
4.0000 mg | Freq: Four times a day (QID) | INTRAMUSCULAR | Status: DC | PRN
Start: 2017-05-09 — End: 2017-05-09

## 2017-05-09 MED ORDER — LIDOCAINE HCL 2 % IJ SOLN
INTRAMUSCULAR | Status: AC
  Filled 2017-05-09: qty 20

## 2017-05-09 MED ORDER — IOPAMIDOL (ISOVUE-370) INJECTION 76%
INTRAVENOUS | Status: AC
  Filled 2017-05-09: qty 50

## 2017-05-09 MED ORDER — SODIUM CHLORIDE 0.9 % IV SOLN: 250.0000 mL | INTRAVENOUS | Status: DC | PRN

## 2017-05-09 MED ORDER — SODIUM CHLORIDE 0.9% FLUSH: 3.0000 mL | Freq: Two times a day (BID) | INTRAVENOUS | Status: DC

## 2017-05-09 MED ORDER — MIDAZOLAM HCL 2 MG/2ML IJ SOLN
INTRAMUSCULAR | Status: AC
  Filled 2017-05-09: qty 2

## 2017-05-09 MED ORDER — SODIUM CHLORIDE 0.9 % IV SOLN: INTRAVENOUS | Status: AC

## 2017-05-09 MED ORDER — VERAPAMIL HCL 2.5 MG/ML IV SOLN
INTRAVENOUS | Status: DC | PRN
  Administered 2017-05-09: 10 mL via INTRA_ARTERIAL

## 2017-05-09 MED ORDER — ACETAMINOPHEN 325 MG PO TABS: 650.0000 mg | ORAL_TABLET | ORAL | Status: DC | PRN

## 2017-05-09 MED ORDER — HEPARIN SODIUM (PORCINE) 1000 UNIT/ML IJ SOLN
INTRAMUSCULAR | Status: AC
  Filled 2017-05-09: qty 1

## 2017-05-09 MED ORDER — HEPARIN SODIUM (PORCINE) 1000 UNIT/ML IJ SOLN
INTRAMUSCULAR | Status: DC | PRN
  Administered 2017-05-09: 5000 [IU] via INTRAVENOUS

## 2017-05-09 MED ORDER — SODIUM CHLORIDE 0.9 % WEIGHT BASED INFUSION: 1.0000 mL/kg/h | INTRAVENOUS | Status: DC

## 2017-05-09 MED ORDER — HEPARIN (PORCINE) IN NACL 2-0.9 UNIT/ML-% IJ SOLN
INTRAMUSCULAR | Status: AC | PRN
  Administered 2017-05-09: 1000 mL via INTRA_ARTERIAL

## 2017-05-09 MED ORDER — HEPARIN (PORCINE) IN NACL 2-0.9 UNIT/ML-% IJ SOLN
INTRAMUSCULAR | Status: AC
  Filled 2017-05-09: qty 1000

## 2017-05-09 MED ORDER — SODIUM CHLORIDE 0.9% FLUSH
3.0000 mL | INTRAVENOUS | Status: DC | PRN
Start: 2017-05-09 — End: 2017-05-09

## 2017-05-09 MED ORDER — SODIUM CHLORIDE 0.9% FLUSH: 3.0000 mL | INTRAVENOUS | Status: DC | PRN

## 2017-05-09 MED ORDER — SODIUM CHLORIDE 0.9 % WEIGHT BASED INFUSION
3.0000 mL/kg/h | INTRAVENOUS | Status: AC
  Administered 2017-05-09: 3 mL/kg/h via INTRAVENOUS

## 2017-05-09 MED ORDER — OXYCODONE-ACETAMINOPHEN 5-325 MG PO TABS: 1.0000 | ORAL_TABLET | ORAL | Status: DC | PRN

## 2017-05-09 MED ORDER — LIDOCAINE HCL 2 % IJ SOLN
INTRAMUSCULAR | Status: DC | PRN
  Administered 2017-05-09: 3 mL via INTRADERMAL

## 2017-05-09 MED ORDER — VERAPAMIL HCL 2.5 MG/ML IV SOLN
INTRAVENOUS | Status: AC
  Filled 2017-05-09: qty 2

## 2017-05-09 SURGICAL SUPPLY — 14 items
CATH INFINITI 5FR ANG PIGTAIL (CATHETERS) ×1 IMPLANT
CATH OPTITORQUE TIG 4.0 5F (CATHETERS) ×1 IMPLANT
COVER PRB 48X5XTLSCP FOLD TPE (BAG) IMPLANT
COVER PROBE 5X48 (BAG) ×2
DEVICE RAD COMP TR BAND LRG (VASCULAR PRODUCTS) ×1 IMPLANT
GLIDESHEATH SLEND A-KIT 6F 22G (SHEATH) ×1 IMPLANT
GLIDESHEATH SLEND SS 6F .021 (SHEATH) ×1 IMPLANT
GUIDEWIRE INQWIRE 1.5J.035X260 (WIRE) IMPLANT
INQWIRE 1.5J .035X260CM (WIRE) ×2
KIT HEART LEFT (KITS) ×2 IMPLANT
PACK CARDIAC CATHETERIZATION (CUSTOM PROCEDURE TRAY) ×2 IMPLANT
SYR MEDRAD MARK V 150ML (SYRINGE) ×2 IMPLANT
TRANSDUCER W/STOPCOCK (MISCELLANEOUS) ×2 IMPLANT
TUBING CIL FLEX 10 FLL-RA (TUBING) ×2 IMPLANT

## 2017-05-09 NOTE — H&P (View-Only) (Signed)
Clinical Summary [name] is a 51 y.o.male seen today for follow up of the following medical problems.   1. CAD - admit 03/2014 with inferior STEMI, received BMS to RCA. LVEF 45-50% by LV gram. 02/2017 nuclear stress: mild apical ischemia vs artifact. Overall low risk - symptoms not improved after starting imdur  - pain in midchest radiating into left arm. Sharp pain, 4/10 in severity. Mainly with activity, better with rest. Better with SL NG. Worst with deep breathing. Better with NG. Increase in frequency since last visit. Pain now every other day.  - increase DOE, though limited by back as well   2. Hyperlipidemia - tolerates pravastatin, reports prior myaglias on a prior statins  - he remains compliant with statin, most recent labs with pcp  .  Past Medical History:  Diagnosis Date  . Anxiety   . Borderline diabetes   . Bulging disc   . Chronic back pain   . Diabetes mellitus without complication ([place])   . Hypercholesteremia   . Pancreatitis      No Known Allergies   Current Outpatient Prescriptions  Medication Sig Dispense Refill  . ALPRAZolam (XANAX) 1 MG tablet Take 1 mg by mouth 3 (three) times daily as needed for anxiety.     Marland Kitchen aspirin 81 MG chewable tablet Chew 1 tablet (81 mg total) by mouth daily.    . carvedilol (COREG) 3.125 MG tablet TAKE 1 TABLET(3.125 MG) BY MOUTH TWICE DAILY WITH A MEAL 60 tablet 0  . ibuprofen (ADVIL,MOTRIN) 200 MG tablet Take 400 mg by mouth every 6 (six) hours as needed.    . isosorbide mononitrate (IMDUR) 30 MG 24 hr tablet Take 0.5 tablets (15 mg total) by mouth daily. 45 tablet 0  . lisinopril (PRINIVIL,ZESTRIL) 2.5 MG tablet TAKE 1 TABLET BY MOUTH EVERY DAY 30 tablet 0  . nitroGLYCERIN (NITROSTAT) 0.4 MG SL tablet Place 1 tablet (0.4 mg total) under the tongue every 5 (five) minutes as needed for chest pain. 25 tablet 3  . oxyCODONE-acetaminophen (PERCOCET/ROXICET) 5-325 MG per tablet Take 1 tablet by mouth every 4 (four)  hours as needed for moderate pain or severe pain. 15 tablet 0  . pravastatin (PRAVACHOL) 40 MG tablet Take 1 tablet (40 mg total) by mouth daily. (Patient not taking: Reported on 03/02/2017) 90 tablet 3   No current facility-administered medications for this visit.      Past Surgical History:  Procedure Laterality Date  . ABDOMINAL SURGERY    . APPENDECTOMY    . BACK SURGERY    . CARDIAC CATHETERIZATION  2003   "trivial coronary artery disease," normal EF  . COLONOSCOPY N/A 04/16/2015   Procedure: COLONOSCOPY;  Surgeon: Daneil Dolin, MD;  [facility];  Service: Endoscopy;  Laterality: N/A;  12:45 PM  . LEFT HEART CATHETERIZATION WITH CORONARY ANGIOGRAM N/A 03/13/2014   Procedure: LEFT HEART CATHETERIZATION WITH CORONARY ANGIOGRAM;  Surgeon: Leonie Man, MD;  Location: [facility] CATH LAB;  Service: Cardiovascular;  Laterality: N/A;     No Known Allergies    Family History  Problem Relation Age of Onset  . Diabetes Father   . Hyperlipidemia Father   . Hypertension Father   . Hypertension Mother   . Hyperlipidemia Mother   . Hyperlipidemia Sister   . Hypertension Sister   . Diabetes Sister      Social History Mr. Kochan reports that he has been smoking Cigarettes.  He started smoking about 39 years ago.  He has a 30.00 pack-year smoking history. He has never used smokeless tobacco. Mr. Rolph reports that he drinks about 3.6 oz of alcohol per week .   Review of Systems CONSTITUTIONAL: No weight loss, fever, chills, weakness or fatigue.  HEENT: Eyes: No visual loss, blurred vision, double vision or yellow sclerae.No hearing loss, sneezing, congestion, runny nose or sore throat.  SKIN: No rash or itching.  CARDIOVASCULAR: per hpi RESPIRATORY: No shortness of breath, cough or sputum.  GASTROINTESTINAL: No anorexia, nausea, vomiting or diarrhea. No abdominal pain or blood.  GENITOURINARY: No burning on urination, no polyuria NEUROLOGICAL: No headache, dizziness, syncope,  paralysis, ataxia, numbness or tingling in the extremities. No change in bowel or bladder control.  MUSCULOSKELETAL: No muscle, back pain, joint pain or stiffness.  LYMPHATICS: No enlarged nodes. No history of splenectomy.  PSYCHIATRIC: No history of depression or anxiety.  ENDOCRINOLOGIC: No reports of sweating, cold or heat intolerance. No polyuria or polydipsia.  Marland Kitchen   Physical Examination Vitals:   05/03/17 1445  BP: 106/78  Pulse: 76  SpO2: 93%   Vitals:   05/03/17 1445  Weight: 231 lb (104.8 kg)  Height: 6\' 1"  (1.854 m)    Gen: resting comfortably, no acute distress HEENT: no scleral icterus, pupils equal round and reactive, no palptable cervical adenopathy,  CV: RRR, no m/r/g, no jvd Resp: Clear to auscultation bilaterally GI: abdomen is soft, non-tender, non-distended, normal bowel sounds, no hepatosplenomegaly MSK: extremities are warm, no edema.  Skin: warm, no rash Neuro:  no focal deficits Psych: appropriate affect   Diagnostic Studies 02/2017 nuclear stress  No diagnostic ST segment changes to indicate ischemia.  Small, mild intensity, apical to basal inferior defect is partially reversible and suggestive of ischemia, although there is gut radiotracer uptake artifact adjacent to the inferior wall on rest imaging which could also be contributing to the defect reversibility.  This is a low risk study.  Nuclear stress EF: 63%.    Assessment and Plan  1. CAD/Chest pain/Abnormal stress test -recent chest pain symptoms, prior nuclear stress with apical ischemia. Ongoing symptoms despite additional antianginal - we will refer for LHC. Increase imdur to 30mg  daily.   2. Hyperlipidemia Request results from pcp, continue statin   I have reviewed the risks, indications, and alternatives to cardiac catheterization, possible angioplasty, and stenting with the patient  today. Risks include but are not limited to bleeding, infection, vascular injury, stroke,  myocardial infection, arrhythmia, kidney injury, radiation-related injury in the case of prolonged fluoroscopy use, emergency cardiac surgery, and death. The patient understands the risks of serious complication is 1-2 in 2130 with diagnostic cardiac cath and 1-2% or less with angioplasty/stenting.       Arnoldo Lenis, M.D.

## 2017-05-09 NOTE — Discharge Instructions (Signed)

## 2017-05-09 NOTE — Interval H&P Note (Signed)
History and Physical Interval Note:  05/09/2017 10:18 AM  [name]  has presented today for surgery, with the diagnosis of cp/angina with abnormal nuclear stress test.    The various methods of treatment have been discussed with the patient and family. After consideration of risks, benefits and other options for treatment, the patient has consented to  Procedure(s): LEFT HEART CATH AND CORONARY ANGIOGRAPHY (N/A) with possible PERCUTANEOUS CORONARY INTERVENTION as a surgical intervention .  The patient's history has been reviewed, patient examined, no change in status, stable for surgery.  I have reviewed the patient's chart and labs.  Questions were answered to the patient's satisfaction.   Cath Lab Visit (complete for each Cath Lab visit)  Clinical Evaluation Leading to the Procedure:   ACS: No.  Non-ACS:    Anginal Classification: CCS II  Anti-ischemic medical therapy: Minimal Therapy (1 class of medications)  Non-Invasive Test Results: Low-risk stress test findings: cardiac mortality <1%/year -with continued symptoms  Prior CABG: No previous CABG   [name]

## 2017-05-10 ENCOUNTER — Encounter (HOSPITAL_COMMUNITY): Payer: Self-pay | Admitting: Cardiology

## 2017-05-10 NOTE — Research (Signed)
OPTIMIZE Informed Consent   Subject Name: [name]  Subject met inclusion and exclusion criteria.  The informed consent form, study requirements and expectations were reviewed with the subject and questions and concerns were addressed prior to the signing of the consent form.  The subject verbalized understanding of the trail requirements.  The subject agreed to participate in the OPTIMIZE trial and signed the informed consent.  The informed consent was obtained prior to performance of any protocol-specific procedures for the subject.  A copy of the signed informed consent was given to the subject and a copy was placed in the subject's medical record. Only applicable if randomized.   [name] D 05/09/2017. 0945 AM

## 2017-05-14 ENCOUNTER — Other Ambulatory Visit: Payer: Self-pay | Admitting: Cardiology

## 2017-05-24 ENCOUNTER — Ambulatory Visit (INDEPENDENT_AMBULATORY_CARE_PROVIDER_SITE_OTHER): Payer: Self-pay | Admitting: Cardiology

## 2017-05-24 ENCOUNTER — Encounter: Payer: Self-pay | Admitting: Cardiology

## 2017-05-24 VITALS — BP 107/68 | HR 60 | Ht 73.0 in | Wt 231.8 lb

## 2017-05-24 DIAGNOSIS — I2510 Atherosclerotic heart disease of native coronary artery without angina pectoris: Secondary | ICD-10-CM

## 2017-05-24 DIAGNOSIS — R0789 Other chest pain: Secondary | ICD-10-CM

## 2017-05-24 DIAGNOSIS — R600 Localized edema: Secondary | ICD-10-CM

## 2017-05-24 MED ORDER — FUROSEMIDE 20 MG PO TABS: ORAL_TABLET | ORAL | 3 refills | Status: DC

## 2017-05-24 NOTE — Progress Notes (Signed)
Clinical Summary Mr. Hard is a 51 y.o.male seen today for follow up of the following medical problems.   1. CAD - admit 03/2014 with inferior STEMI, received BMS to RCA. LVEF 45-50% by LV gram. 02/2017 nuclear stress: mild apical ischemia vs artifact. Overall low risk - symptoms not improved after starting imdur  - pain in midchest radiating into left arm. Sharp pain, 4/10 in severity. Mainly with activity, better with rest. Better with SL NG. Worst with deep breathing. Better with NG. Increase in frequency since last visit. Pain now every other day.  - increase DOE, though limited by back as well  02/2017 nuclear stress: inferior ischemia 04/2017 cath: patent coronaries, patent RCA stent. Elevated LVEDP 26 - no improvement with imdur in symptoms since last visit  2. Hyperlipidemia - tolerates pravastatin, reports prior myaglias on a prior statins  - he remains compliant with statin, most recent labs with pcp   3. Preoperative  -possible plans for back surgery  Past Medical History:  Diagnosis Date  . Anxiety   . Borderline diabetes   . Bulging disc   . Chronic back pain   . Diabetes mellitus without complication ([place])   . Hypercholesteremia   . Pancreatitis      No Known Allergies   Current Outpatient Medications  Medication Sig Dispense Refill  . aspirin 81 MG chewable tablet Chew 1 tablet (81 mg total) by mouth daily.    . carvedilol (COREG) 3.125 MG tablet TAKE 1 TABLET BY MOUTH TWICE DAILY WITH A MEAL 60 tablet 6  . isosorbide mononitrate (IMDUR) 30 MG 24 hr tablet Take 1 tablet (30 mg total) by mouth daily. 90 tablet 1  . lisinopril (PRINIVIL,ZESTRIL) 2.5 MG tablet Take 1 tablet (2.5 mg total) by mouth daily. 90 tablet 1  . Multiple Vitamins-Minerals (MULTIVITAMIN WITH MINERALS) tablet Take 1 tablet by mouth daily.    . nitroGLYCERIN (NITROSTAT) 0.4 MG SL tablet Place 1 tablet (0.4 mg total) under the tongue every 5 (five) minutes as needed for chest  pain. 25 tablet 3  . oxyCODONE-acetaminophen (PERCOCET/ROXICET) 5-325 MG per tablet Take 1 tablet by mouth every 4 (four) hours as needed for moderate pain or severe pain. 15 tablet 0   No current facility-administered medications for this visit.      Past Surgical History:  Procedure Laterality Date  . ABDOMINAL SURGERY    . APPENDECTOMY    . BACK SURGERY    . CARDIAC CATHETERIZATION  2003   "trivial coronary artery disease," normal EF     No Known Allergies    Family History  Problem Relation Age of Onset  . Diabetes Father   . Hyperlipidemia Father   . Hypertension Father   . Hypertension Mother   . Hyperlipidemia Mother   . Hyperlipidemia Sister   . Hypertension Sister   . Diabetes Sister      Social History Mr. Macken reports that he has been smoking cigarettes.  He started smoking about 39 years ago. He has a 30.00 pack-year smoking history. he has never used smokeless tobacco. Mr. Soward reports that he drinks about 3.6 oz of alcohol per week.   Review of Systems CONSTITUTIONAL: No weight loss, fever, chills, weakness or fatigue.  HEENT: Eyes: No visual loss, blurred vision, double vision or yellow sclerae.No hearing loss, sneezing, congestion, runny nose or sore throat.  SKIN: No rash or itching.  CARDIOVASCULAR: per hpi RESPIRATORY: No shortness of breath, cough or sputum.  GASTROINTESTINAL: No  anorexia, nausea, vomiting or diarrhea. No abdominal pain or blood.  GENITOURINARY: No burning on urination, no polyuria NEUROLOGICAL: No headache, dizziness, syncope, paralysis, ataxia, numbness or tingling in the extremities. No change in bowel or bladder control.  MUSCULOSKELETAL: No muscle, back pain, joint pain or stiffness.  LYMPHATICS: No enlarged nodes. No history of splenectomy.  PSYCHIATRIC: No history of depression or anxiety.  ENDOCRINOLOGIC: No reports of sweating, cold or heat intolerance. No polyuria or polydipsia.  Marland Kitchen   Physical Examination Vitals:    05/24/17 1442  BP: 107/68  Pulse: 60   Vitals:   05/24/17 1442  Weight: 231 lb 12.8 oz (105.1 kg)  Height: 6\' 1"  (1.854 m)    Gen: resting comfortably, no acute distress HEENT: no scleral icterus, pupils equal round and reactive, no palptable cervical adenopathy,  CV: RRR, no m/r/g, no jvd Resp: Clear to auscultation bilaterally GI: abdomen is soft, non-tender, non-distended, normal bowel sounds, no hepatosplenomegaly MSK: extremities are warm, mild edema.  Skin: warm, no rash Neuro:  no focal deficits Psych: appropriate affect   Diagnostic Studies  02/2017 nuclear stress  No diagnostic ST segment changes to indicate ischemia.  Small, mild intensity, apical to basal inferior defect is partially reversible and suggestive of ischemia, although there is gut radiotracer uptake artifact adjacent to the inferior wall on rest imaging which could also be contributing to the defect reversibility.  This is a low risk study.  Nuclear stress EF: 63%.   04/2017 cath   Angiographically normal coronary arteries  Dist RCA BMS (VeriFlex 5.0 x 15), 0 %stenosed.  The left ventricular systolic function is normal. The left ventricular ejection fraction is 50-55% by visual estimate.  LV end diastolic pressure is moderately elevated.   Patient essentially has intra-graphic normal coronary arteries with widely RCA.  No culprit lesion to explain inferior ischemia. Elevated LVEDP could explain microvascular ischemia.  Otherwise consider non-anginal etiology for chest pain.   Plan: Discharge home today after bed rest with TR band removal.  Assessment and Plan  1. CAD/Chest pain/Abnormal stress test -recent chest pain symptoms, recent cath with patent coronaries - elevated LVEDP, give lasix 20mg  x 5 days then prn only. Check BMET/Mg in 2 weeks  2. Hyperlipidemia - continue statin      Arnoldo Lenis, M.D.

## 2017-05-24 NOTE — Patient Instructions (Signed)
Medication Instructions:   Begin Lasix 20mg  daily x 5 days only, then just as needed for swelling / edema.   Continue all other medications.    Labwork:  BMET, Magnesium - orders given today.  Due in approximately 2 weeks.   Office will contact with results via phone or letter.    Testing/Procedures: none  Follow-Up: 2 months   Any Other Special Instructions Will Be Listed Below (If Applicable).  If you need a refill on your cardiac medications before your next appointment, please call your pharmacy.

## 2017-05-28 ENCOUNTER — Encounter: Payer: Self-pay | Admitting: Cardiology

## 2017-07-27 ENCOUNTER — Ambulatory Visit: Payer: Self-pay | Admitting: Cardiology

## 2017-07-29 ENCOUNTER — Other Ambulatory Visit: Payer: Self-pay | Admitting: Cardiology

## 2017-08-29 ENCOUNTER — Encounter: Payer: Self-pay | Admitting: Cardiology

## 2017-08-29 ENCOUNTER — Ambulatory Visit (INDEPENDENT_AMBULATORY_CARE_PROVIDER_SITE_OTHER): Payer: Self-pay | Admitting: Cardiology

## 2017-08-29 VITALS — BP 107/62 | HR 68 | Ht 73.0 in | Wt 228.0 lb

## 2017-08-29 DIAGNOSIS — E782 Mixed hyperlipidemia: Secondary | ICD-10-CM

## 2017-08-29 DIAGNOSIS — I25119 Atherosclerotic heart disease of native coronary artery with unspecified angina pectoris: Secondary | ICD-10-CM

## 2017-08-29 LAB — BASIC METABOLIC PANEL
BUN/Creatinine Ratio: 13 (ref 9–20)
BUN: 10 mg/dL (ref 6–24)
CALCIUM: 9.3 mg/dL (ref 8.7–10.2)
CO2: 22 mmol/L (ref 20–29)
CREATININE: 0.77 mg/dL (ref 0.76–1.27)
Chloride: 97 mmol/L (ref 96–106)
GFR calc Af Amer: 121 mL/min/{1.73_m2} (ref >59)
GFR, EST NON AFRICAN AMERICAN: 104 mL/min/{1.73_m2} (ref >59)
GLUCOSE: 125 mg/dL — AB (ref 65–99)
Potassium: 4.4 mmol/L (ref 3.5–5.2)
SODIUM: 137 mmol/L (ref 134–144)

## 2017-08-29 LAB — MAGNESIUM: Magnesium: 2.6 mg/dL — ABNORMAL HIGH (ref 1.6–2.3)

## 2017-08-29 MED ORDER — ISOSORBIDE MONONITRATE ER 30 MG PO TB24: 15.0000 mg | ORAL_TABLET | Freq: Every day | ORAL | 1 refills | Status: DC

## 2017-08-29 NOTE — Patient Instructions (Addendum)
Your physician recommends that you schedule a follow-up appointment in: [facility] has recommended you make the following change in your medication:   INCREASE IMDUR 45 MG (1 AND 1/2 TABLETS) DAILY    Thank you for choosing [facility]!!

## 2017-08-29 NOTE — Progress Notes (Signed)
Clinical Summary Mr. Biskup is a 52 y.o.male seen today for follow up of the following medical problems.   1. CAD - admit 03/2014 with inferior STEMI, received BMS to RCA. LVEF 45-50% by LV gram. 02/2017 nuclear stress: mild apical ischemia vs artifact. Overall low risk - symptoms not improved after starting imdur  - pain in midchest radiating into left arm. Sharp pain, 4/10 in severity. Mainly with activity, better with rest. Better with SL NG. Worst with deep breathing. Better with NG. Increase in frequency since last visit. Pain now every other day.  - increase DOE, though limited by back as well  02/2017 nuclear stress: inferior ischemia 04/2017 cath: patent coronaries, patent RCA stent. Elevated LVEDP 26  - still with chest at times. SOB with activities. Better with NG.  - self pay, we have not considered ranexa for him.   2. Hyperlipidemia - tolerates pravastatin, reports prior myaglias on a prior statins  - compliant with statin.      SH: brings disability papers today   Past Medical History:  Diagnosis Date  . Anxiety   . Borderline diabetes   . Bulging disc   . Chronic back pain   . Diabetes mellitus without complication ([place])   . Hypercholesteremia   . Pancreatitis      No Known Allergies   Current Outpatient Medications  Medication Sig Dispense Refill  . ALPRAZolam (XANAX) 1 MG tablet Take 1 tablet 3 (three) times daily as needed by mouth.  2  . aspirin 81 MG chewable tablet Chew 1 tablet (81 mg total) by mouth daily.    . carvedilol (COREG) 3.125 MG tablet TAKE 1 TABLET BY MOUTH TWICE DAILY WITH A MEAL 60 tablet 6  . carvedilol (COREG) 3.125 MG tablet TAKE 1 TABLET(3.125 MG) BY MOUTH TWICE DAILY WITH A MEAL 60 tablet 6  . furosemide (LASIX) 20 MG tablet Take one tab (20mg ) daily by mouth x 5 days only, then take just as needed for swelling / edema. 30 tablet 3  . isosorbide mononitrate (IMDUR) 30 MG 24 hr tablet Take 1 tablet (30 mg total) by  mouth daily. 90 tablet 1  . lisinopril (PRINIVIL,ZESTRIL) 2.5 MG tablet Take 1 tablet (2.5 mg total) by mouth daily. 90 tablet 1  . Multiple Vitamins-Minerals (MULTIVITAMIN WITH MINERALS) tablet Take 1 tablet by mouth daily.    . nitroGLYCERIN (NITROSTAT) 0.4 MG SL tablet Place 1 tablet (0.4 mg total) under the tongue every 5 (five) minutes as needed for chest pain. 25 tablet 3  . oxyCODONE-acetaminophen (PERCOCET/ROXICET) 5-325 MG per tablet Take 1 tablet by mouth every 4 (four) hours as needed for moderate pain or severe pain. 15 tablet 0   No current facility-administered medications for this visit.      Past Surgical History:  Procedure Laterality Date  . ABDOMINAL SURGERY    . APPENDECTOMY    . BACK SURGERY    . CARDIAC CATHETERIZATION  2003   "trivial coronary artery disease," normal EF  . COLONOSCOPY N/A 04/16/2015   Procedure: COLONOSCOPY;  Surgeon: Daneil Dolin, MD;  Location: [facility];  Service: Endoscopy;  Laterality: N/A;  12:45 PM  . LEFT HEART CATH AND CORONARY ANGIOGRAPHY N/A 05/09/2017   Procedure: LEFT HEART CATH AND CORONARY ANGIOGRAPHY;  Surgeon: Leonie Man, MD;  Location: [facility];  Service: Cardiovascular;  Laterality: N/A;  . LEFT HEART CATHETERIZATION WITH CORONARY ANGIOGRAM N/A 03/13/2014   Procedure: LEFT HEART CATHETERIZATION WITH CORONARY  Cyril Loosen;  Surgeon: Leonie Man, MD;  Location: [facility] CATH [facility];  Service: Cardiovascular;  Laterality: N/A;     No Known Allergies    Family History  Problem Relation Age of Onset  . Diabetes Father   . Hyperlipidemia Father   . Hypertension Father   . Hypertension Mother   . Hyperlipidemia Mother   . Hyperlipidemia Sister   . Hypertension Sister   . Diabetes Sister      Social History Mr. Morrell reports that he has been smoking cigarettes.  He started smoking about 39 years ago. He has a 30.00 pack-year smoking history. he has never used smokeless tobacco. Mr. Tavis reports that he drinks  about 3.6 oz of alcohol per week.   Review of Systems CONSTITUTIONAL: No weight loss, fever, chills, weakness or fatigue.  HEENT: Eyes: No visual loss, blurred vision, double vision or yellow sclerae.No hearing loss, sneezing, congestion, runny nose or sore throat.  SKIN: No rash or itching.  CARDIOVASCULAR: per hpi RESPIRATORY: per hpi  GASTROINTESTINAL: No anorexia, nausea, vomiting or diarrhea. No abdominal pain or blood.  GENITOURINARY: No burning on urination, no polyuria NEUROLOGICAL: No headache, dizziness, syncope, paralysis, ataxia, numbness or tingling in the extremities. No change in bowel or bladder control.  MUSCULOSKELETAL: No muscle, back pain, joint pain or stiffness.  LYMPHATICS: No enlarged nodes. No history of splenectomy.  PSYCHIATRIC: No history of depression or anxiety.  ENDOCRINOLOGIC: No reports of sweating, cold or heat intolerance. No polyuria or polydipsia.  Marland Kitchen   Physical Examination Vitals:   08/29/17 1434  BP: 107/62  Pulse: 68  SpO2: 98%   Vitals:   08/29/17 1434  Weight: 228 lb (103.4 kg)  Height: 6\' 1"  (1.854 m)    Gen: resting comfortably, no acute distress HEENT: no scleral icterus, pupils equal round and reactive, no palptable cervical adenopathy,  CV: RRR, no m/r/g, no jvd Resp: Clear to auscultation bilaterally GI: abdomen is soft, non-tender, non-distended, normal bowel sounds, no hepatosplenomegaly MSK: extremities are warm, no edema.  Skin: warm, no rash Neuro:  no focal deficits Psych: appropriate affect   Diagnostic Studies 02/2017 nuclear stress  No diagnostic ST segment changes to indicate ischemia.  Small, mild intensity, apical to basal inferior defect is partially reversible and suggestive of ischemia, although there is gut radiotracer uptake artifact adjacent to the inferior wall on rest imaging which could also be contributing to the defect reversibility.  This is a low risk study.  Nuclear stress EF:  63%.   04/2017 cath   Angiographically normal coronary arteries  Dist RCA BMS (VeriFlex 5.0 x 15), 0 %stenosed.  The left ventricular systolic function is normal. The left ventricular ejection fraction is 50-55% by visual estimate.  LV end diastolic pressure is moderately elevated.  Patient essentially has intra-graphic normal coronary arteries with widely RCA. No culprit lesion to explain inferior ischemia. Elevated LVEDP could explain microvascular ischemia.  Otherwise consider non-anginal etiology for chest pain.   Plan: Discharge home today after bed rest with TR band removal.    Assessment and Plan  1. CAD/Chest pain/Abnormal stress test -recent chest pain symptoms, recent cath with patent coronaries - could be related to coronary vasospasm, or perhaps microvascular disease - antianginal therapy limited by soft bp's. He is self pay, don't think able to afford ranexa - will try increasing imdur to 45mg  daily  2. Hyperlipidemia - he will continue statin  3. Disability - he drops off papers today. From cardiac standpoint he is limited  even in mild exertion due to chest pain.   F/u 3 months       Arnoldo Lenis, M.D.

## 2017-08-30 ENCOUNTER — Encounter: Payer: Self-pay | Admitting: Cardiology

## 2017-08-31 ENCOUNTER — Telehealth: Payer: Self-pay | Admitting: *Deleted

## 2017-08-31 NOTE — Telephone Encounter (Signed)
-----   Message from Arnoldo Lenis, MD sent at 08/30/2017  8:30 AM EST ----- This patient was to increase imdur to 45mg  daily, his AVS reports decreasing to 15mg  daily   Zandra Abts MD

## 2017-08-31 NOTE — Telephone Encounter (Signed)
Pt aware and says he has been taking 1 and 1/2 tablets daily of Imdur - updated medication list and AVS

## 2017-09-01 ENCOUNTER — Telehealth: Payer: Self-pay | Admitting: *Deleted

## 2017-09-01 NOTE — Telephone Encounter (Signed)
-----   Message from Arnoldo Lenis, MD sent at 08/30/2017  3:00 PM EST ----- Labs look good  Zandra Abts MD

## 2017-09-01 NOTE — Telephone Encounter (Signed)
Pt aware - routed to pcp  

## 2017-10-28 ENCOUNTER — Other Ambulatory Visit: Payer: Self-pay | Admitting: Cardiology

## 2017-10-29 ENCOUNTER — Emergency Department (HOSPITAL_COMMUNITY): Payer: Medicaid Other

## 2017-10-29 ENCOUNTER — Emergency Department (HOSPITAL_COMMUNITY)
Admission: EM | Admit: 2017-10-29 | Discharge: 2017-10-29 | Disposition: A | Discharge status: Home / Self Care | Payer: Medicaid Other | Attending: Emergency Medicine | Admitting: Emergency Medicine

## 2017-10-29 ENCOUNTER — Other Ambulatory Visit: Payer: Self-pay

## 2017-10-29 ENCOUNTER — Encounter (HOSPITAL_COMMUNITY): Payer: Self-pay | Admitting: Emergency Medicine

## 2017-10-29 DIAGNOSIS — K852 Alcohol induced acute pancreatitis without necrosis or infection: Secondary | ICD-10-CM | POA: Insufficient documentation

## 2017-10-29 DIAGNOSIS — Z955 Presence of coronary angioplasty implant and graft: Secondary | ICD-10-CM | POA: Diagnosis not present

## 2017-10-29 DIAGNOSIS — I2510 Atherosclerotic heart disease of native coronary artery without angina pectoris: Secondary | ICD-10-CM | POA: Insufficient documentation

## 2017-10-29 DIAGNOSIS — K5900 Constipation, unspecified: Secondary | ICD-10-CM | POA: Insufficient documentation

## 2017-10-29 DIAGNOSIS — R630 Anorexia: Secondary | ICD-10-CM | POA: Diagnosis not present

## 2017-10-29 DIAGNOSIS — E119 Type 2 diabetes mellitus without complications: Secondary | ICD-10-CM | POA: Diagnosis not present

## 2017-10-29 DIAGNOSIS — F17210 Nicotine dependence, cigarettes, uncomplicated: Secondary | ICD-10-CM | POA: Diagnosis not present

## 2017-10-29 DIAGNOSIS — Z7982 Long term (current) use of aspirin: Secondary | ICD-10-CM | POA: Insufficient documentation

## 2017-10-29 DIAGNOSIS — Z79899 Other long term (current) drug therapy: Secondary | ICD-10-CM | POA: Insufficient documentation

## 2017-10-29 DIAGNOSIS — R1011 Right upper quadrant pain: Secondary | ICD-10-CM | POA: Diagnosis present

## 2017-10-29 HISTORY — DX: Acute myocardial infarction, unspecified: I21.9

## 2017-10-29 LAB — URINALYSIS, ROUTINE W REFLEX MICROSCOPIC
Bilirubin Urine: NEGATIVE
Glucose, UA: 150 mg/dL — AB
HGB URINE DIPSTICK: NEGATIVE
KETONES UR: 20 mg/dL — AB
Leukocytes, UA: NEGATIVE
NITRITE: NEGATIVE
PH: 6 (ref 5.0–8.0)
Protein, ur: NEGATIVE mg/dL
Specific Gravity, Urine: 1.027 (ref 1.005–1.030)

## 2017-10-29 LAB — COMPREHENSIVE METABOLIC PANEL
ALT: 19 U/L (ref 17–63)
AST: 20 U/L (ref 15–41)
Albumin: 3.9 g/dL (ref 3.5–5.0)
Alkaline Phosphatase: 69 U/L (ref 38–126)
Anion gap: 17 — ABNORMAL HIGH (ref 5–15)
BILIRUBIN TOTAL: 1.6 mg/dL — AB (ref 0.3–1.2)
BUN: 13 mg/dL (ref 6–20)
CO2: 20 mmol/L — ABNORMAL LOW (ref 22–32)
CREATININE: 0.81 mg/dL (ref 0.61–1.24)
Calcium: 9.1 mg/dL (ref 8.9–10.3)
Chloride: 96 mmol/L — ABNORMAL LOW (ref 101–111)
GFR calc Af Amer: >60 mL/min (ref >60)
Glucose, Bld: 198 mg/dL — ABNORMAL HIGH (ref 65–99)
Potassium: 3.9 mmol/L (ref 3.5–5.1)
Sodium: 133 mmol/L — ABNORMAL LOW (ref 135–145)
TOTAL PROTEIN: 7.6 g/dL (ref 6.5–8.1)

## 2017-10-29 LAB — CBC WITH DIFFERENTIAL/PLATELET
BASOS ABS: 0 10*3/uL (ref 0.0–0.1)
Basophils Relative: 0 %
EOS ABS: 0.3 10*3/uL (ref 0.0–0.7)
Eosinophils Relative: 2 %
HEMATOCRIT: 45.6 % (ref 39.0–52.0)
Hemoglobin: 16.4 g/dL (ref 13.0–17.0)
LYMPHS ABS: 2.5 10*3/uL (ref 0.7–4.0)
Lymphocytes Relative: 15 %
MCH: 33.6 pg (ref 26.0–34.0)
MCHC: 36 g/dL (ref 30.0–36.0)
MCV: 93.4 fL (ref 78.0–100.0)
Monocytes Absolute: 1.2 10*3/uL — ABNORMAL HIGH (ref 0.1–1.0)
Monocytes Relative: 7 %
NEUTROS ABS: 12.6 10*3/uL — AB (ref 1.7–7.7)
Neutrophils Relative %: 76 %
Platelets: 171 10*3/uL (ref 150–400)
RBC: 4.88 MIL/uL (ref 4.22–5.81)
RDW: 14.3 % (ref 11.5–15.5)
WBC: 16.6 10*3/uL — ABNORMAL HIGH (ref 4.0–10.5)

## 2017-10-29 LAB — LIPASE, BLOOD: LIPASE: 122 U/L — AB (ref 11–51)

## 2017-10-29 MED ORDER — IOPAMIDOL (ISOVUE-300) INJECTION 61%
100.0000 mL | Freq: Once | INTRAVENOUS | Status: AC | PRN
  Administered 2017-10-29: 100 mL via INTRAVENOUS

## 2017-10-29 MED ORDER — SODIUM CHLORIDE 0.9 % IV BOLUS
1000.0000 mL | Freq: Once | INTRAVENOUS | Status: AC
  Administered 2017-10-29: 1000 mL via INTRAVENOUS

## 2017-10-29 MED ORDER — MORPHINE SULFATE (PF) 4 MG/ML IV SOLN
4.0000 mg | Freq: Once | INTRAVENOUS | Status: AC
  Administered 2017-10-29: 4 mg via INTRAVENOUS
  Filled 2017-10-29: qty 1

## 2017-10-29 MED ORDER — HYDROCODONE-ACETAMINOPHEN 5-325 MG PO TABS: 1.0000 | ORAL_TABLET | Freq: Four times a day (QID) | ORAL | 0 refills | Status: DC | PRN

## 2017-10-29 MED ORDER — FENTANYL CITRATE (PF) 100 MCG/2ML IJ SOLN
50.0000 ug | Freq: Once | INTRAMUSCULAR | Status: DC
  Filled 2017-10-29: qty 2

## 2017-10-29 NOTE — ED Provider Notes (Addendum)
[facility] EMERGENCY DEPARTMENT Provider Note   CSN: [id number] Arrival date & time: 10/29/17  5093     History   Chief Complaint Chief Complaint  Patient presents with  . Abdominal Pain    HPI [name] is a 52 y.o. male past medical history of angina, diverticulosis, small bowel obstruction, CAD who presents for evaluation of abdominal pain that has been ongoing for the last 3 days.  Patient reports that pain is more so in the right side and rates it at a 6/10.  Patient states that he feels like "my gut is up in my chest." He states it is been constant.  No alleviating or aggravating factors.  He reports he has been able to eat and drink without difficulty but does report some decreased appetite.  Patient reports his last bowel movement was 3 days ago.  He took Ex-Lax with no improvement in constipation.  Patient states that he thinks he was still passing flatus yesterday but does not know about today.  Patient denies any medications at home.  Patient denies any fevers, chest pain, difficulty breathing, vomiting, dysuria, hematuria.  The history is provided by the patient.    Past Medical History:  Diagnosis Date  . Anxiety   . Borderline diabetes   . Bulging disc   . Chronic back pain   . Diabetes mellitus without complication ([place])   . Hypercholesteremia   . Myocardial infarction ([place])   . Pancreatitis     Patient Active Problem List   Diagnosis Date Noted  . Angina, class II ([place]) - Low Risk ST, but persistent chest pain. 05/08/2017  . History of colonic polyps   . Diverticulosis of colon without hemorrhage   . ST-segment elevation myocardial infarction (STEMI) of inferior wall ([place]) 03/14/2014  . Presence of bare metal stent in right coronary artery: VeriFlex BMS 5.0 mm x 15 mm (5.6 mm) 03/14/2014    Class: Status post  . CAD S/P percutaneous coronary angioplasty 03/14/2014  . Chest pain 03/13/2014  . Pancreatitis 01/19/2014  . Hyperlipidemia associated with  type 2 diabetes mellitus ([place]) 01/19/2014  . Diabetes mellitus ([place]) 01/19/2014    Past Surgical History:  Procedure Laterality Date  . ABDOMINAL SURGERY    . APPENDECTOMY    . BACK SURGERY    . CARDIAC CATHETERIZATION  2003   "trivial coronary artery disease," normal EF  . COLONOSCOPY N/A 04/16/2015   Procedure: COLONOSCOPY;  Surgeon: Daneil Dolin, MD;  [facility];  Service: Endoscopy;  Laterality: N/A;  12:45 PM  . LEFT HEART CATH AND CORONARY ANGIOGRAPHY N/A 05/09/2017   Procedure: LEFT HEART CATH AND CORONARY ANGIOGRAPHY;  Surgeon: Leonie Man, MD;  Location: [place] CV LAB;  Service: Cardiovascular;  Laterality: N/A;  . LEFT HEART CATHETERIZATION WITH CORONARY ANGIOGRAM N/A 03/13/2014   Procedure: LEFT HEART CATHETERIZATION WITH CORONARY ANGIOGRAM;  Surgeon: Leonie Man, MD;  Location: [facility] CATH LAB;  Service: Cardiovascular;  Laterality: N/A;        Home Medications    Prior to Admission medications   Medication Sig Start Date End Date Taking? Authorizing Provider  ALPRAZolam Duanne Moron) 1 MG tablet Take 1 tablet 3 (three) times daily as needed by mouth. 05/02/17   [provider]  aspirin 81 MG chewable tablet Chew 1 tablet (81 mg total) by mouth daily. 03/15/14   Eileen Stanford, PA-C  carvedilol (COREG) 3.125 MG tablet TAKE 1 TABLET BY MOUTH TWICE DAILY WITH A MEAL  05/15/17   Herminio Commons, MD  carvedilol (COREG) 3.125 MG tablet TAKE 1 TABLET(3.125 MG) BY MOUTH TWICE DAILY WITH A MEAL 07/31/17   Branch, Alphonse Guild, MD  furosemide (LASIX) 20 MG tablet Take one tab (43m) daily by mouth x 5 days only, then take just as needed for swelling / edema. 05/24/17   BArnoldo Lenis MD  HYDROcodone-acetaminophen (NORCO/VICODIN) 5-325 MG tablet Take 1-2 tablets by mouth every 6 (six) hours as needed. 10/29/17   LVolanda Napoleon PA-C  isosorbide mononitrate (IMDUR) 30 MG 24 hr tablet Take 45 mg by mouth daily.    [provider]    lisinopril (PRINIVIL,ZESTRIL) 2.5 MG tablet Take 1 tablet (2.5 mg total) by mouth daily. 05/03/17   BArnoldo Lenis MD  Multiple Vitamins-Minerals (MULTIVITAMIN WITH MINERALS) tablet Take 1 tablet by mouth daily.    [provider]  nitroGLYCERIN (NITROSTAT) 0.4 MG SL tablet Place 1 tablet (0.4 mg total) under the tongue every 5 (five) minutes as needed for chest pain. 03/02/17   BArnoldo Lenis MD  oxyCODONE-acetaminophen (PERCOCET/ROXICET) 5-325 MG per tablet Take 1 tablet by mouth every 4 (four) hours as needed for moderate pain or severe pain. 01/21/14   GCaren Griffins MD    Family History Family History  Problem Relation Age of Onset  . Diabetes Father   . Hyperlipidemia Father   . Hypertension Father   . Hypertension Mother   . Hyperlipidemia Mother   . Hyperlipidemia Sister   . Hypertension Sister   . Diabetes Sister     Social History Social History   Tobacco Use  . Smoking status: Current Every Day Smoker    Packs/day: 0.50    Years: 30.00    Pack years: 15.00    Types: Cigarettes    Start date: 10/25/1977  . Smokeless tobacco: Never Used  Substance Use Topics  . Alcohol use: Yes    Alcohol/week: 3.6 oz    Types: 6 Cans of beer per week  . Drug use: No     Allergies   Patient has no known allergies.   Review of Systems Review of Systems  Constitutional: Negative for fever.  Respiratory: Negative for cough and shortness of breath.   Cardiovascular: Negative for chest pain.  Gastrointestinal: Positive for abdominal pain and constipation. Negative for nausea and vomiting.  Genitourinary: Negative for dysuria and hematuria.  Neurological: Negative for headaches.  All other systems reviewed and are negative.    Physical Exam Updated Vital Signs BP 100/61   Pulse 67   Temp 98.2 F (36.8 C) (Oral)   Resp 18   Ht 6' 1" (1.854 m)   Wt 103.4 kg (228 lb)   SpO2 97%   BMI 30.08 kg/m   Physical Exam  Constitutional: He is oriented to  person, place, and time. He appears well-developed and well-nourished.  HENT:  Head: Normocephalic and atraumatic.  Mouth/Throat: Oropharynx is clear and moist and mucous membranes are normal.  Eyes: Pupils are equal, round, and reactive to light. Conjunctivae, EOM and lids are normal.  Neck: Full passive range of motion without pain.  Cardiovascular: Normal rate, regular rhythm, normal heart sounds and normal pulses. Exam reveals no gallop and no friction rub.  No murmur heard. Pulmonary/Chest: Effort normal and breath sounds normal.  No evidence of respiratory distress. Able to speak in full sentences without difficulty.  Abdominal: Soft. Normal appearance. Bowel sounds are decreased. There is tenderness in the right upper quadrant, right  lower quadrant and left upper quadrant. There is no rigidity, no guarding and no CVA tenderness.  Abdomen is soft, nondistended.  Musculoskeletal: Normal range of motion.  Neurological: He is alert and oriented to person, place, and time.  Skin: Skin is warm and dry. Capillary refill takes less than 2 seconds.  Psychiatric: He has a normal mood and affect. His speech is normal.  Nursing note and vitals reviewed.    ED Treatments / Results  Labs (all labs ordered are listed, but only abnormal results are displayed) Labs Reviewed  COMPREHENSIVE METABOLIC PANEL - Abnormal; Notable for the following components:      Result Value   Sodium 133 (*)    Chloride 96 (*)    CO2 20 (*)    Glucose, Bld 198 (*)    Total Bilirubin 1.6 (*)    Anion gap 17 (*)    All other components within normal limits  LIPASE, BLOOD - Abnormal; Notable for the following components:   Lipase 122 (*)    All other components within normal limits  CBC WITH DIFFERENTIAL/PLATELET - Abnormal; Notable for the following components:   WBC 16.6 (*)    Neutro Abs 12.6 (*)    Monocytes Absolute 1.2 (*)    All other components within normal limits  URINALYSIS, ROUTINE W REFLEX  MICROSCOPIC - Abnormal; Notable for the following components:   Glucose, UA 150 (*)    Ketones, ur 20 (*)    All other components within normal limits    EKG None  Radiology Ct Abdomen Pelvis W Contrast  Result Date: 10/29/2017 CLINICAL DATA:  Abdominal pain for the past 2 days. No bowel movement for the past week. EXAM: CT ABDOMEN AND PELVIS WITH CONTRAST TECHNIQUE: Multidetector CT imaging of the abdomen and pelvis was performed using the standard protocol following bolus administration of intravenous contrast. CONTRAST:  169m ISOVUE-300 IOPAMIDOL (ISOVUE-300) INJECTION 61% COMPARISON:  01/19/2014. FINDINGS: Lower chest: Minimal dependent atelectasis at the right lung base. Hepatobiliary: Mild diffuse low density of the liver relative to the spleen. Normal appearing gallbladder. Pancreas: Interval mild peripancreatic soft tissue stranding posterior to the pancreatic head with edema extending laterally between the duodenum and right kidney. There is diffuse low density wall thickening of that portion of the duodenum. No fluid collections or free peritoneal air seen. The previously seen low density in the pancreatic tail is no longer visualized. Spleen: Normal in size without focal abnormality. Adrenals/Urinary Tract: Adrenal glands are unremarkable. Kidneys are normal, without renal calculi, focal lesion, or hydronephrosis. Bladder is unremarkable. Stomach/Bowel: Concentric low density wall thickening involving the 2nd portion of the duodenum with a small amount of. Duodenal fluid posteriorly. The stomach and remainder of the small bowel have normal appearances. Post appendectomy changes. Multiple sigmoid colon diverticula. Normal amount of stool in the colon. Vascular/Lymphatic: Mildly prominent peripancreatic nodes posterior to the pancreatic head in the area of soft tissue stranding, the largest with a short axis diameter of 5 mm on image number 39 of series 2. Mildly prominent upper paracaval  nodes, the largest with a short axis diameter of 8 mm on image number 30 of series 2. Atheromatous arterial calcifications without aneurysm. Reproductive: Mildly enlarged, lobulated and heterogeneous prostate gland containing coarse calcifications. Other: Small left inguinal hernia containing fat. Musculoskeletal: Lumbar and lower thoracic spine degenerative changes. IMPRESSION: 1. Right upper quadrant abdominal soft tissue stranding and edema, as described above. This could be due to pancreatitis involving the head of the pancreas or due  to duodenitis involving the 2nd portion of the duodenum. Minimal reactive adenopathy in the right upper abdomen. 2. Mild diffuse hepatic steatosis. 3. Sigmoid diverticulosis. Electronically Signed   By: Claudie Revering M.D.   On: 10/29/2017 10:33    Procedures Procedures (including critical care time)  Medications Ordered in ED Medications  sodium chloride 0.9 % bolus 1,000 mL (0 mLs Intravenous Stopped 10/29/17 1033)  morphine 4 MG/ML injection 4 mg (4 mg Intravenous Given 10/29/17 0948)  iopamidol (ISOVUE-300) 61 % injection 100 mL (100 mLs Intravenous Contrast Given 10/29/17 1003)     Initial Impression / Assessment and Plan / ED Course  I have reviewed the triage vital signs and the nursing notes.  Pertinent labs & imaging results that were available during my care of the patient were reviewed by me and considered in my medical decision making (see chart for details).     52 year old male past medical history of CAD, bowel obstruction, angina who presents for evaluation of abdominal pain x3 days.  Associated with constipation.  No fevers.  No nausea/vomiting but does report decreased appetite.  Describes pain is constant. Patient is afebrile, non-toxic appearing, sitting comfortably on examination table. Vital signs reviewed and stable.  On exam, patient has tenderness palpation, most notably at the right upper and right lower quadrant.  No rigidity, guarding.   Consider infectious etiology versus bowel obstruction versus hepatobiliary etiology.  Plan to check basic labs. IVF given for fluid resuscitation. Analgesics provided in the department.  CBC with leukocytosis of 16.6.  Lipase is elevated at 122.  Records reviewed to that he had a previous elevated lipase in 2015.  None since.  CMP shows sodium of 133, bicarb of 20.  Patient's AST and ALT, alk phos are within normal limits.  Slight bump in total bili at 1.6.  Anion gap is 17.  Discussed results the patient.  He reports some improvement in pain after analgesics.  Patient does report that he had been drinking last week prior to onset of symptoms.  Patient reports that he had been drinking beer every day and states that he drank an entire bottle of whiskey.  CT abdomen pelvis shows evidence of density wall thickening involving second portion of duodenum with small amount 1 of fluid posteriorly.  There is mild para pancreatic soft tissue stranding with edema.  No fluid collection or peritoneal air is seen.  I discussed with patient regarding treatment options and offered admission.  Patient states he does not want to be admitted.Based on patient's presentation and work-up, he has a Ranson's criteria of 1.  Here in the ED, he is afebrile.  Vital signs are stable.  Repeat abdominal exam is improved. Still with some mild tenderness. Patient has been able to tolerate p.o. at home without any nausea/vomiting.  Given history/physical physical presentation, I suspect the patient's pancreatitis is alcohol induced as he has been drinking prior to onset of symptoms.  Given reassuring work-up here in the ED, I feel it is reasonable for patient to be treated on outpatient basis.  Patient instructed on clear diet to help with pain.  He has an appointment with his primary care doctor tomorrow morning.  Encouraged him to take that appointment. Patient had ample opportunity for questions and discussion. All patient's questions  were answered with full understanding. Strict return precautions discussed. Patient expresses understanding and agreement to plan.    Final Clinical Impressions(s) / ED Diagnoses   Final diagnoses:  Alcohol-induced acute pancreatitis, unspecified complication  status    ED Discharge Orders        Ordered    HYDROcodone-acetaminophen (NORCO/VICODIN) 5-325 MG tablet  Every 6 hours PRN     10/29/17 1159       Volanda Napoleon, PA-C 10/29/17 1645    Volanda Napoleon, PA-C 10/29/17 1646    Hayden Rasmussen, MD 10/31/17 1125

## 2017-10-29 NOTE — ED Notes (Signed)
Pt to CT

## 2017-10-29 NOTE — ED Triage Notes (Signed)
Pt reports abd pain since Friday with last BM Saturday.  Took Exlax Friday with no significant results.  Hx of bowel obstruction.

## 2017-10-29 NOTE — Discharge Instructions (Signed)
As we discussed, do not drink anymore alcohol this is likely causing the inflammation in your pancreas.  He can take pain medication as directed.  As we discussed, you should eat/drink only clear liquids for the next few days as this will help improve your pancreatitis.  Follow-up with referred GI doctor as directed.  Follow-up with your primary care doctor as you had previously scheduled for tomorrow.  Return to the emergency department for any fevers, worsening pain, vomiting or any other worsening or concerning symptoms.

## 2017-11-22 ENCOUNTER — Other Ambulatory Visit: Payer: Self-pay | Admitting: Cardiology

## 2017-11-30 ENCOUNTER — Other Ambulatory Visit: Payer: Self-pay | Admitting: Cardiology

## 2017-12-06 ENCOUNTER — Encounter: Payer: Self-pay | Admitting: Cardiology

## 2017-12-06 ENCOUNTER — Ambulatory Visit: Payer: Medicaid Other | Admitting: Cardiology

## 2017-12-06 VITALS — BP 100/58 | HR 74 | Ht 73.0 in | Wt 219.6 lb

## 2017-12-06 DIAGNOSIS — E782 Mixed hyperlipidemia: Secondary | ICD-10-CM | POA: Diagnosis not present

## 2017-12-06 DIAGNOSIS — I2510 Atherosclerotic heart disease of native coronary artery without angina pectoris: Secondary | ICD-10-CM

## 2017-12-06 MED ORDER — PRAVASTATIN SODIUM 20 MG PO TABS: 20.0000 mg | ORAL_TABLET | Freq: Every evening | ORAL | 1 refills | Status: DC

## 2017-12-06 NOTE — Progress Notes (Signed)
Clinical Summary Mr. Xia is a 52 y.o.male seen today for follow up of the following medical problems.   1. CAD - admit 03/2014 with inferior STEMI, received BMS to RCA. LVEF 45-50% by LV gram. 02/2017 nuclear stress: mild apical ischemia vs artifact. Overall low risk -02/2017 nuclear stress: inferior ischemia 04/2017 cath: patent coronaries, patent RCA stent. Elevated LVEDP 26  - self pay, we have not considered ranexa for him. Soft bp's have limited antianginal therapy.  - mild chest pain at times, overall unchanged. Chronic DOE which is stable.   2. Hyperlipidemia - tolerates pravastatin, reports prior myaglias on a prior statins  - remains compliant with statin   Past Medical History:  Diagnosis Date  . Anxiety   . Borderline diabetes   . Bulging disc   . Chronic back pain   . Diabetes mellitus without complication ([place])   . Hypercholesteremia   . Myocardial infarction ([place])   . Pancreatitis      No Known Allergies   Current Outpatient Medications  Medication Sig Dispense Refill  . ALPRAZolam (XANAX) 1 MG tablet Take 1 tablet 3 (three) times daily as needed by mouth.  2  . aspirin 81 MG chewable tablet Chew 1 tablet (81 mg total) by mouth daily.    . carvedilol (COREG) 3.125 MG tablet TAKE 1 TABLET BY MOUTH TWICE DAILY WITH A MEAL 60 tablet 6  . carvedilol (COREG) 3.125 MG tablet TAKE 1 TABLET(3.125 MG) BY MOUTH TWICE DAILY WITH A MEAL 60 tablet 6  . furosemide (LASIX) 20 MG tablet TAKE 1 TABLET BY MOUTH EVERY DAY FOR 5 DAYS AS NEEDED FOR SWELLING 90 tablet 0  . HYDROcodone-acetaminophen (NORCO/VICODIN) 5-325 MG tablet Take 1-2 tablets by mouth every 6 (six) hours as needed. 8 tablet 0  . isosorbide mononitrate (IMDUR) 30 MG 24 hr tablet Take 45 mg by mouth daily.    . isosorbide mononitrate (IMDUR) 30 MG 24 hr tablet Take 1.5 tablets (45 mg total) by mouth daily. 135 tablet 1  . lisinopril (PRINIVIL,ZESTRIL) 2.5 MG tablet TAKE 1 TABLET(2.5 MG) BY MOUTH DAILY  90 tablet 0  . Multiple Vitamins-Minerals (MULTIVITAMIN WITH MINERALS) tablet Take 1 tablet by mouth daily.    . nitroGLYCERIN (NITROSTAT) 0.4 MG SL tablet Place 1 tablet (0.4 mg total) under the tongue every 5 (five) minutes as needed for chest pain. 25 tablet 3  . oxyCODONE-acetaminophen (PERCOCET/ROXICET) 5-325 MG per tablet Take 1 tablet by mouth every 4 (four) hours as needed for moderate pain or severe pain. 15 tablet 0   No current facility-administered medications for this visit.      Past Surgical History:  Procedure Laterality Date  . ABDOMINAL SURGERY    . APPENDECTOMY    . BACK SURGERY    . CARDIAC CATHETERIZATION  2003   "trivial coronary artery disease," normal EF  . COLONOSCOPY N/A 04/16/2015   Procedure: COLONOSCOPY;  Surgeon: Daneil Dolin, MD;  [facility];  Service: Endoscopy;  Laterality: N/A;  12:45 PM  . LEFT HEART CATH AND CORONARY ANGIOGRAPHY N/A 05/09/2017   Procedure: LEFT HEART CATH AND CORONARY ANGIOGRAPHY;  Surgeon: Leonie Man, MD;  Location: [place] CV LAB;  Service: Cardiovascular;  Laterality: N/A;  . LEFT HEART CATHETERIZATION WITH CORONARY ANGIOGRAM N/A 03/13/2014   Procedure: LEFT HEART CATHETERIZATION WITH CORONARY ANGIOGRAM;  Surgeon: Leonie Man, MD;  Location: [facility] CATH LAB;  Service: Cardiovascular;  Laterality: N/A;     No Known Allergies  Family History  Problem Relation Age of Onset  . Diabetes Father   . Hyperlipidemia Father   . Hypertension Father   . Hypertension Mother   . Hyperlipidemia Mother   . Hyperlipidemia Sister   . Hypertension Sister   . Diabetes Sister      Social History Mr. Bewley reports that he has been smoking cigarettes.  He started smoking about 40 years ago. He has a 15.00 pack-year smoking history. He has never used smokeless tobacco. Mr. Sease reports that he drinks about 3.6 oz of alcohol per week.   Review of Systems CONSTITUTIONAL: No weight loss, fever, chills, weakness or  fatigue.  HEENT: Eyes: No visual loss, blurred vision, double vision or yellow sclerae.No hearing loss, sneezing, congestion, runny nose or sore throat.  SKIN: No rash or itching.  CARDIOVASCULAR: per hpi RESPIRATORY: per hpi.  GASTROINTESTINAL: No anorexia, nausea, vomiting or diarrhea. No abdominal pain or blood.  GENITOURINARY: No burning on urination, no polyuria NEUROLOGICAL: No headache, dizziness, syncope, paralysis, ataxia, numbness or tingling in the extremities. No change in bowel or bladder control.  MUSCULOSKELETAL: No muscle, back pain, joint pain or stiffness.  LYMPHATICS: No enlarged nodes. No history of splenectomy.  PSYCHIATRIC: No history of depression or anxiety.  ENDOCRINOLOGIC: No reports of sweating, cold or heat intolerance. No polyuria or polydipsia.  Marland Kitchen   Physical Examination Vitals:   12/06/17 1448  BP: (!) 100/58  Pulse: 74  SpO2: 96%   Vitals:   12/06/17 1448  Weight: 219 lb 9.6 oz (99.6 kg)  Height: 6\' 1"  (1.854 m)    Gen: resting comfortably, no acute distress HEENT: no scleral icterus, pupils equal round and reactive, no palptable cervical adenopathy,  CV: RRR, no m/r/g, no jvd Resp: Clear to auscultation bilaterally GI: abdomen is soft, non-tender, non-distended, normal bowel sounds, no hepatosplenomegaly MSK: extremities are warm, no edema.  Skin: warm, no rash Neuro:  no focal deficits Psych: appropriate affect   Diagnostic Studies 02/2017 nuclear stress  No diagnostic ST segment changes to indicate ischemia.  Small, mild intensity, apical to basal inferior defect is partially reversible and suggestive of ischemia, although there is gut radiotracer uptake artifact adjacent to the inferior wall on rest imaging which could also be contributing to the defect reversibility.  This is a low risk study.  Nuclear stress EF: 63%.   04/2017 cath   Angiographically normal coronary arteries  Dist RCA BMS (VeriFlex 5.0 x 15), 0  %stenosed.  The left ventricular systolic function is normal. The left ventricular ejection fraction is 50-55% by visual estimate.  LV end diastolic pressure is moderately elevated.  Patient essentially has intra-graphic normal coronary arteries with widely RCA. No culprit lesion to explain inferior ischemia. Elevated LVEDP could explain microvascular ischemia.  Otherwise consider non-anginal etiology for chest pain.   Plan: Discharge home today after bed rest with TR band removal.       Assessment and Plan  1. CAD -recent chest pain symptoms,recent cath with patent coronaries - could be related to coronary vasospasm, or perhaps microvascular disease - antianginal therapy limited by soft bp's. He is self pay, don't think able to afford ranexa  -overall doing well, continue current meds  2. Hyperlipidemia - continue current statin, pravastatin 20mg  daily. Side effects on other statins.     F/u 6 month       Arnoldo Lenis, M.D.

## 2017-12-06 NOTE — Patient Instructions (Addendum)
Medication Instructions:  Your physician has recommended you make the following change in your medication:    START Pravastatin 20 mg daily   Please continue all other medications as prescribed  Labwork: NONE  Testing/Procedures: NONE  Follow-Up: Your physician wants you to follow-up in: [address] [place] You will receive a reminder letter in the mail two months in advance. If you don't receive a letter, please call our office to schedule the follow-up appointment.  Any Other Special Instructions Will Be Listed Below (If Applicable).  If you need a refill on your cardiac medications before your next appointment, please call your pharmacy.

## 2017-12-09 ENCOUNTER — Encounter: Payer: Self-pay | Admitting: Cardiology

## 2018-02-06 ENCOUNTER — Other Ambulatory Visit: Payer: Self-pay | Admitting: Cardiology

## 2018-02-08 ENCOUNTER — Other Ambulatory Visit: Payer: Self-pay | Admitting: *Deleted

## 2018-02-08 ENCOUNTER — Other Ambulatory Visit: Payer: Self-pay | Admitting: Cardiology

## 2018-02-08 MED ORDER — FUROSEMIDE 20 MG PO TABS: ORAL_TABLET | ORAL | 0 refills | Status: DC

## 2018-02-08 NOTE — Telephone Encounter (Signed)
Patient informed that Lasix refill sent in May & Lisinopril refill sent yesterday.  Both to [facility].  He will call pharmacy.

## 2018-02-08 NOTE — Telephone Encounter (Signed)
° °  1. Which medications need to be refilled? (please list name of each medication and dose if known)  Lasix 20 mg & Lisinopril 2.5    2. Which pharmacy/location (including street and city if local pharmacy) is medication to be sent to?  [facility]   [place]   3. Do they need a 30 day or 90 day supply?

## 2018-02-28 ENCOUNTER — Encounter: Payer: Self-pay | Admitting: Internal Medicine

## 2018-03-07 ENCOUNTER — Emergency Department (HOSPITAL_COMMUNITY): Payer: Medicaid Other

## 2018-03-07 ENCOUNTER — Encounter (HOSPITAL_COMMUNITY): Payer: Self-pay | Admitting: Emergency Medicine

## 2018-03-07 ENCOUNTER — Emergency Department (HOSPITAL_COMMUNITY)
Admission: EM | Admit: 2018-03-07 | Discharge: 2018-03-07 | Disposition: A | Discharge status: Home / Self Care | Payer: Medicaid Other | Attending: Emergency Medicine | Admitting: Emergency Medicine

## 2018-03-07 ENCOUNTER — Other Ambulatory Visit: Payer: Self-pay

## 2018-03-07 DIAGNOSIS — E119 Type 2 diabetes mellitus without complications: Secondary | ICD-10-CM | POA: Insufficient documentation

## 2018-03-07 DIAGNOSIS — R109 Unspecified abdominal pain: Secondary | ICD-10-CM

## 2018-03-07 DIAGNOSIS — R1013 Epigastric pain: Secondary | ICD-10-CM | POA: Insufficient documentation

## 2018-03-07 DIAGNOSIS — F17210 Nicotine dependence, cigarettes, uncomplicated: Secondary | ICD-10-CM | POA: Diagnosis not present

## 2018-03-07 DIAGNOSIS — I252 Old myocardial infarction: Secondary | ICD-10-CM | POA: Diagnosis not present

## 2018-03-07 LAB — CBC WITH DIFFERENTIAL/PLATELET
BASOS PCT: 0 %
Basophils Absolute: 0 10*3/uL (ref 0.0–0.1)
EOS ABS: 0.2 10*3/uL (ref 0.0–0.7)
EOS PCT: 1 %
HCT: 43.1 % (ref 39.0–52.0)
Hemoglobin: 16.3 g/dL (ref 13.0–17.0)
Lymphocytes Relative: 18 %
Lymphs Abs: 2.2 10*3/uL (ref 0.7–4.0)
MCH: 35.4 pg — ABNORMAL HIGH (ref 26.0–34.0)
MCHC: 37.8 g/dL — ABNORMAL HIGH (ref 30.0–36.0)
MCV: 93.7 fL (ref 78.0–100.0)
MONO ABS: 1 10*3/uL (ref 0.1–1.0)
Monocytes Relative: 8 %
Neutro Abs: 8.7 10*3/uL (ref 1.7–7.7)
Neutrophils Relative %: 73 %
PLATELETS: 121 10*3/uL — AB (ref 150–400)
RBC: 4.6 MIL/uL (ref 4.22–5.81)
RDW: 14.1 % (ref 11.5–15.5)
WBC: 12 10*3/uL — AB (ref 4.0–10.5)

## 2018-03-07 LAB — COMPREHENSIVE METABOLIC PANEL
ALBUMIN: 4 g/dL (ref 3.5–5.0)
ALT: 24 U/L (ref 0–44)
AST: 19 U/L (ref 15–41)
Alkaline Phosphatase: 64 U/L (ref 38–126)
Anion gap: 11 (ref 5–15)
BUN: 13 mg/dL (ref 6–20)
CHLORIDE: 103 mmol/L (ref 98–111)
CO2: 24 mmol/L (ref 22–32)
Calcium: 9.2 mg/dL (ref 8.9–10.3)
Creatinine, Ser: 0.85 mg/dL (ref 0.61–1.24)
GFR calc Af Amer: >60 mL/min (ref >60)
GLUCOSE: 192 mg/dL — AB (ref 70–99)
POTASSIUM: 4 mmol/L (ref 3.5–5.1)
SODIUM: 138 mmol/L (ref 135–145)
Total Bilirubin: 1 mg/dL (ref 0.3–1.2)
Total Protein: 7.4 g/dL (ref 6.5–8.1)

## 2018-03-07 LAB — LIPASE, BLOOD: LIPASE: 42 U/L (ref 11–51)

## 2018-03-07 LAB — TROPONIN I: Troponin I: <0.03 ng/mL (ref <0.03)

## 2018-03-07 MED ORDER — GI COCKTAIL ~~LOC~~
30.0000 mL | Freq: Once | ORAL | Status: AC
  Administered 2018-03-07: 30 mL via ORAL
  Filled 2018-03-07: qty 30

## 2018-03-07 MED ORDER — PANTOPRAZOLE SODIUM 20 MG PO TBEC: 20.0000 mg | DELAYED_RELEASE_TABLET | Freq: Every day | ORAL | 0 refills | Status: DC

## 2018-03-07 MED ORDER — OXYCODONE-ACETAMINOPHEN 5-325 MG PO TABS: 1.0000 | ORAL_TABLET | Freq: Three times a day (TID) | ORAL | 0 refills | Status: DC | PRN

## 2018-03-07 MED ORDER — LACTATED RINGERS IV BOLUS
1000.0000 mL | Freq: Once | INTRAVENOUS | Status: AC
  Administered 2018-03-07: 1000 mL via INTRAVENOUS

## 2018-03-07 MED ORDER — IOPAMIDOL (ISOVUE-300) INJECTION 61%
100.0000 mL | Freq: Once | INTRAVENOUS | Status: AC | PRN
  Administered 2018-03-07: 100 mL via INTRAVENOUS

## 2018-03-07 MED ORDER — MORPHINE SULFATE (PF) 4 MG/ML IV SOLN
INTRAVENOUS | Status: AC
  Filled 2018-03-07: qty 1

## 2018-03-07 MED ORDER — ONDANSETRON HCL 4 MG PO TABS: 4.0000 mg | ORAL_TABLET | Freq: Three times a day (TID) | ORAL | 0 refills | Status: DC | PRN

## 2018-03-07 MED ORDER — RANITIDINE HCL 150 MG/10ML PO SYRP
150.0000 mg | ORAL_SOLUTION | Freq: Once | ORAL | Status: AC
  Administered 2018-03-07: 150 mg via ORAL
  Filled 2018-03-07: qty 10

## 2018-03-07 MED ORDER — PANTOPRAZOLE SODIUM 40 MG PO TBEC
40.0000 mg | DELAYED_RELEASE_TABLET | Freq: Once | ORAL | Status: AC
  Administered 2018-03-07: 40 mg via ORAL
  Filled 2018-03-07: qty 1

## 2018-03-07 MED ORDER — MORPHINE SULFATE (PF) 4 MG/ML IV SOLN
6.0000 mg | Freq: Once | INTRAVENOUS | Status: AC
Start: 2018-03-07 — End: 2018-03-07
  Administered 2018-03-07: 6 mg via INTRAVENOUS
  Filled 2018-03-07: qty 2

## 2018-03-07 MED ORDER — MORPHINE SULFATE (PF) 4 MG/ML IV SOLN
6.0000 mg | Freq: Once | INTRAVENOUS | Status: AC
  Administered 2018-03-07: 6 mg via INTRAVENOUS
  Filled 2018-03-07: qty 2

## 2018-03-07 MED ORDER — RANITIDINE HCL 150 MG PO TABS: 150.0000 mg | ORAL_TABLET | Freq: Two times a day (BID) | ORAL | 0 refills | Status: DC

## 2018-03-07 NOTE — ED Triage Notes (Signed)
Pt reports pancreatitis for two days. History of same per pt. Denies n/v/d. Pt reports quit drinking two months ago.

## 2018-03-07 NOTE — ED Notes (Signed)
Have given pt something to drink

## 2018-03-07 NOTE — ED Notes (Signed)
Pt states during orthostatics he felt "slightly lightheaded" while standing for his BP.

## 2018-03-07 NOTE — ED Provider Notes (Signed)
Emergency Department Provider Note   I have reviewed the triage vital signs and the nursing notes.   HISTORY  Chief Complaint Abdominal Pain   HPI [name] is a 52 y.o. male with multiple medical problems as documented below to include STEMI, angina, pancreatitis and reflux the presents to the emergency department today with epigastric pain that radiates up into his chest.  Patient states that is been going on for couple days and feels like last on the time he had pancreatitis.  He does not follow any specific diet.  He does smoke cigarettes but does not longer drink alcohol.  Does not do any drugs.  He is compliant with medications.  I reviewed the records appears that last time he is here probably is more consistent with duodenitis rather than pancreatitis.  He has not had any fevers, nausea, vomiting.  He has had some diminished bowel movements over the last couple days.  No urinary symptoms.  No trauma. No other associated or modifying symptoms.    Past Medical History:  Diagnosis Date  . Anxiety   . Borderline diabetes   . Bulging disc   . Chronic back pain   . Diabetes mellitus without complication ([place])   . Hypercholesteremia   . Myocardial infarction ([place])   . Pancreatitis     Patient Active Problem List   Diagnosis Date Noted  . Angina, class II ([place]) - Low Risk ST, but persistent chest pain. 05/08/2017  . History of colonic polyps   . Diverticulosis of colon without hemorrhage   . ST-segment elevation myocardial infarction (STEMI) of inferior wall ([place]) 03/14/2014  . Presence of bare metal stent in right coronary artery: VeriFlex BMS 5.0 mm x 15 mm (5.6 mm) 03/14/2014    Class: Status post  . CAD S/P percutaneous coronary angioplasty 03/14/2014  . Chest pain 03/13/2014  . Pancreatitis 01/19/2014  . Hyperlipidemia associated with type 2 diabetes mellitus ([place]) 01/19/2014  . Diabetes mellitus ([place]) 01/19/2014    Past Surgical History:  Procedure Laterality  Date  . ABDOMINAL SURGERY    . APPENDECTOMY    . BACK SURGERY    . CARDIAC CATHETERIZATION  2003   "trivial coronary artery disease," normal EF  . COLONOSCOPY N/A 04/16/2015   Procedure: COLONOSCOPY;  Surgeon: Daneil Dolin, MD;  [facility];  Service: Endoscopy;  Laterality: N/A;  12:45 PM  . LEFT HEART CATH AND CORONARY ANGIOGRAPHY N/A 05/09/2017   Procedure: LEFT HEART CATH AND CORONARY ANGIOGRAPHY;  Surgeon: Leonie Man, MD;  Location: [place] [facility];  Service: Cardiovascular;  Laterality: N/A;  . LEFT HEART CATHETERIZATION WITH CORONARY ANGIOGRAM N/A 03/13/2014   Procedure: LEFT HEART CATHETERIZATION WITH CORONARY ANGIOGRAM;  Surgeon: Leonie Man, MD;  Location: [facility] CATH LAB;  Service: Cardiovascular;  Laterality: N/A;    Current Outpatient Rx  . Order #: [id number] Class: Historical Med  . Order #: [id number] Class: No Print  . Order #: [id number] Class: Historical Med  . Order #: [id number] Class: Normal  . Order #: [id number] Class: Normal  . Order #: [id number] Class: Historical Med  . Order #: [id number] Class: Normal  . Order #: [id number] Class: Normal  . Order #: [id number] Class: Historical Med  . Order #: [id number] Class: Normal  . Order #: [id number] Class: Normal  . Order #: [id number] Class: Print  . Order #: [id number] Class: Print  . Order #: [id number] Class: Print  . Order #: [id number] Class: Print    Allergies Patient has no known allergies.  Family History  Problem  Relation Age of Onset  . Diabetes Father   . Hyperlipidemia Father   . Hypertension Father   . Hypertension Mother   . Hyperlipidemia Mother   . Hyperlipidemia Sister   . Hypertension Sister   . Diabetes Sister     Social History Social History   Tobacco Use  . Smoking status: Current Every Day Smoker    Packs/day: 0.50    Years: 30.00    Pack years: 15.00    Types: Cigarettes    Start date: 10/25/1977  . Smokeless tobacco: Never Used  Substance Use Topics  . Alcohol use: Yes     Alcohol/week: 6.0 standard drinks    Types: 6 Cans of beer per week  . Drug use: No    Review of Systems  All other systems negative except as documented in the HPI. All pertinent positives and negatives as reviewed in the HPI. ____________________________________________   PHYSICAL EXAM:  VITAL SIGNS: Blood pressure (!) 90/57, pulse (!) 56, resp. rate 16, height 6\' 1"  (1.854 m), weight 99.8 kg, SpO2 92 %.  Constitutional: Alert and oriented. Well appearing and in no acute distress. Eyes: Conjunctivae are normal. PERRL. EOMI. Head: Atraumatic. Nose: No congestion/rhinnorhea. Mouth/Throat: Mucous membranes are moist.  Oropharynx non-erythematous. Neck: No stridor.  No meningeal signs.   Cardiovascular: Normal rate, regular rhythm. Good peripheral circulation. Grossly normal heart sounds.   Respiratory: Normal respiratory effort.  No retractions. Lungs CTAB. Gastrointestinal: Soft and ttp in epigastric area. No distention.  Musculoskeletal: No lower extremity tenderness nor edema. No gross deformities of extremities. Neurologic:  Normal speech and language. No gross focal neurologic deficits are appreciated.  Skin:  Skin is warm, dry and intact. No rash noted.  ____________________________________________   LABS (all labs ordered are listed, but only abnormal results are displayed)  Labs Reviewed  CBC WITH DIFFERENTIAL/PLATELET - Abnormal; Notable for the following components:      Result Value   WBC 12.0 (*)    MCH 35.4 (*)    MCHC 37.8 (*)    Platelets 121 (*)    All other components within normal limits  COMPREHENSIVE METABOLIC PANEL - Abnormal; Notable for the following components:   Glucose, Bld 192 (*)    All other components within normal limits  LIPASE, BLOOD  TROPONIN I   ____________________________________________  EKG   EKG Interpretation  Date/Time:  Wednesday March 07 2018 08:56:41 EDT Ventricular Rate:  59 PR Interval:    QRS Duration: 99 QT  Interval:  421 QTC Calculation: 417 R Axis:   42 Text Interpretation:  Sinus rhythm improved twi in III since october similar mild ST elevations in V2-5 as on multiple previous Confirmed by Merrily Pew [phone]) on 03/07/2018 9:35:34 AM       ____________________________________________  RADIOLOGY  Dg Chest 2 View  Result Date: 03/07/2018 CLINICAL DATA:  52 year old male with mid epigastric and chest pain. Past history of pancreatitis. EXAM: CHEST - 2 VIEW COMPARISON:  Prior chest x-ray 03/13/2014 FINDINGS: The lungs are clear and negative for focal airspace consolidation, pulmonary edema or suspicious pulmonary nodule. No pleural effusion or pneumothorax. Cardiac and mediastinal contours are within normal limits. No acute fracture or lytic or blastic osseous lesions. The visualized upper abdominal bowel gas pattern is unremarkable. IMPRESSION: Negative chest x-ray. Electronically Signed   By: Jacqulynn Cadet M.D.   On: 03/07/2018 10:43   Ct Abdomen Pelvis W Contrast  Result Date: 03/07/2018 CLINICAL DATA:  52 year old male with a history of upper abdominal  pain for 2 days EXAM: CT ABDOMEN AND PELVIS WITH CONTRAST TECHNIQUE: Multidetector CT imaging of the abdomen and pelvis was performed using the standard protocol following bolus administration of intravenous contrast. CONTRAST:  141mL ISOVUE-300 IOPAMIDOL (ISOVUE-300) INJECTION 61% COMPARISON:  10/29/2017 FINDINGS: Lower chest: Atelectasis at the lung bases. No acute finding of the lower chest. Hepatobiliary: Decreased attenuation/enhancement of liver parenchyma. Unremarkable gallbladder. Pancreas: Compared to the prior CT there has been near complete resolution of the inflammatory changes adjacent to the pancreas. There is vague edema in the fat between the descending duodenum and the pancreatic head with no focal fluid. No calcifications. Unremarkable attenuation/enhancement of the pancreatic parenchyma. Spleen: Unremarkable spleen  Adrenals/Urinary Tract: Unremarkable appearance of the adrenal glands. No evidence of hydronephrosis of the right or left kidney. No nephrolithiasis. Unremarkable course of the bilateral ureters. Unremarkable appearance of the urinary bladder. Stomach/Bowel: Unremarkable stomach. Resolution of inflammatory changes adjacent to the duodenum. No small bowel distention. No focal transition point. No focal wall thickening. Surgical changes of appendectomy. Mild stool burden. Colonic diverticula with no inflammatory changes. Vascular/Lymphatic: Mild atherosclerotic calcifications of the abdominal aorta. Bilateral iliac and proximal femoral vasculature patent. Small lymph nodes in the periaortic nodal stations. Small lymph nodes in the upper abdomen, portacaval nodal station, not enlarged. Overall, nodal pattern is unchanged from the prior. Reproductive: Calcifications of the prostate. Other: Bilateral fat containing inguinal hernia. Musculoskeletal: Small paralabral cyst of the right hip. No acute displaced fracture. Degenerative changes of the lumbar spine. Degenerative changes of bilateral hips. IMPRESSION: Overall there is significant improvement in the inflammatory changes adjacent to the pancreas when compared to the CT of 10/29/2017, however, there is persisting/recurrent mild edema adjacent to the pancreatic head. This may represent a new mild pancreatitis developing, or residual changes from prior episode. Correlation with lab values may be useful. Diverticular disease without evidence of acute diverticulitis. Liver steatosis. Aortic Atherosclerosis (ICD10-I70.0). Electronically Signed   By: Corrie Mckusick D.O.   On: 03/07/2018 16:36    ____________________________________________   PROCEDURES  Procedure(s) performed:   Procedures   ____________________________________________   INITIAL IMPRESSION / ASSESSMENT AND PLAN / ED COURSE  Patient with persistent epigastric abdominal pain after PPI/H2/GI  cocktail and multiple rounds of morphine. Soft pressures as well. Will continue to hydrate and CT to eval pancreas/large bowel for any significant abnormality.   Ct with evidence of resolving pancreatitis. Pain controlled. Tolerating PO. Stable for dc.   Pertinent labs & imaging results that were available during my care of the patient were reviewed by me and considered in my medical decision making (see chart for details).  ____________________________________________  FINAL CLINICAL IMPRESSION(S) / ED DIAGNOSES  Final diagnoses:  Abdominal pain, unspecified abdominal location     MEDICATIONS GIVEN DURING THIS VISIT:  Medications  gi cocktail (Maalox,Lidocaine,Donnatal) (30 mLs Oral Given 03/07/18 0946)  ranitidine (ZANTAC) 150 MG/10ML syrup 150 mg (150 mg Oral Given 03/07/18 0946)  morphine 4 MG/ML injection 6 mg (6 mg Intravenous Given 03/07/18 0946)  pantoprazole (PROTONIX) EC tablet 40 mg (40 mg Oral Given 03/07/18 1217)  morphine 4 MG/ML injection 6 mg (6 mg Intravenous Given 03/07/18 1217)  lactated ringers bolus 1,000 mL (0 mLs Intravenous Stopped 03/07/18 1434)  lactated ringers bolus 1,000 mL (1,000 mLs Intravenous New Bag/Given 03/07/18 1515)  iopamidol (ISOVUE-300) 61 % injection 100 mL (100 mLs Intravenous Contrast Given 03/07/18 1559)     NEW OUTPATIENT MEDICATIONS STARTED DURING THIS VISIT:  New Prescriptions   ONDANSETRON (ZOFRAN) 4 MG  TABLET    Take 1 tablet (4 mg total) by mouth every 8 (eight) hours as needed for nausea or vomiting.   OXYCODONE-ACETAMINOPHEN (PERCOCET) 5-325 MG TABLET    Take 1 tablet by mouth every 8 (eight) hours as needed for severe pain.   PANTOPRAZOLE (PROTONIX) 20 MG TABLET    Take 1 tablet (20 mg total) by mouth daily.   RANITIDINE (ZANTAC) 150 MG TABLET    Take 1 tablet (150 mg total) by mouth 2 (two) times daily.    Note:  This note was prepared with assistance of Dragon voice recognition software. Occasional wrong-word or sound-a-like  substitutions may have occurred due to the inherent limitations of voice recognition software.   Merrily Pew, MD 03/07/18 [phone]

## 2018-03-07 NOTE — ED Notes (Signed)
Pt reports he doesn't have a ride if he gets pain medication. Pt reports he would look for one.

## 2018-03-10 ENCOUNTER — Emergency Department (HOSPITAL_COMMUNITY)
Admission: EM | Admit: 2018-03-10 | Discharge: 2018-03-10 | Disposition: A | Discharge status: Home / Self Care | Payer: Medicaid Other | Attending: Emergency Medicine | Admitting: Emergency Medicine

## 2018-03-10 ENCOUNTER — Other Ambulatory Visit: Payer: Self-pay

## 2018-03-10 ENCOUNTER — Emergency Department (HOSPITAL_COMMUNITY): Payer: Medicaid Other

## 2018-03-10 ENCOUNTER — Encounter (HOSPITAL_COMMUNITY): Payer: Self-pay

## 2018-03-10 DIAGNOSIS — Z7984 Long term (current) use of oral hypoglycemic drugs: Secondary | ICD-10-CM | POA: Diagnosis not present

## 2018-03-10 DIAGNOSIS — R1012 Left upper quadrant pain: Secondary | ICD-10-CM | POA: Insufficient documentation

## 2018-03-10 DIAGNOSIS — E119 Type 2 diabetes mellitus without complications: Secondary | ICD-10-CM | POA: Diagnosis not present

## 2018-03-10 DIAGNOSIS — I2510 Atherosclerotic heart disease of native coronary artery without angina pectoris: Secondary | ICD-10-CM | POA: Insufficient documentation

## 2018-03-10 DIAGNOSIS — K5900 Constipation, unspecified: Secondary | ICD-10-CM | POA: Diagnosis not present

## 2018-03-10 DIAGNOSIS — F17210 Nicotine dependence, cigarettes, uncomplicated: Secondary | ICD-10-CM | POA: Diagnosis not present

## 2018-03-10 DIAGNOSIS — Z79899 Other long term (current) drug therapy: Secondary | ICD-10-CM | POA: Diagnosis not present

## 2018-03-10 LAB — CBC WITH DIFFERENTIAL/PLATELET
Basophils Absolute: 0 10*3/uL (ref 0.0–0.1)
Basophils Relative: 0 %
Eosinophils Absolute: 0.2 10*3/uL (ref 0.0–0.7)
Eosinophils Relative: 2 %
HCT: 44.7 % (ref 39.0–52.0)
HEMOGLOBIN: 15.3 g/dL (ref 13.0–17.0)
LYMPHS ABS: 1.9 10*3/uL (ref 0.7–4.0)
LYMPHS PCT: 20 %
MCH: 32.4 pg (ref 26.0–34.0)
MCHC: 34.2 g/dL (ref 30.0–36.0)
MCV: 94.7 fL (ref 78.0–100.0)
Monocytes Absolute: 0.8 10*3/uL (ref 0.1–1.0)
Monocytes Relative: 8 %
NEUTROS PCT: 70 %
Neutro Abs: 6.7 10*3/uL (ref 1.7–7.7)
Platelets: 134 10*3/uL — ABNORMAL LOW (ref 150–400)
RBC: 4.72 MIL/uL (ref 4.22–5.81)
RDW: 14.3 % (ref 11.5–15.5)
WBC: 9.6 10*3/uL (ref 4.0–10.5)

## 2018-03-10 LAB — COMPREHENSIVE METABOLIC PANEL
ALK PHOS: 66 U/L (ref 38–126)
ALT: 26 U/L (ref 0–44)
AST: 23 U/L (ref 15–41)
Albumin: 4 g/dL (ref 3.5–5.0)
Anion gap: 11 (ref 5–15)
BUN: 15 mg/dL (ref 6–20)
CALCIUM: 9 mg/dL (ref 8.9–10.3)
CO2: 24 mmol/L (ref 22–32)
CREATININE: 0.75 mg/dL (ref 0.61–1.24)
Chloride: 100 mmol/L (ref 98–111)
Glucose, Bld: 163 mg/dL — ABNORMAL HIGH (ref 70–99)
Potassium: 3.2 mmol/L — ABNORMAL LOW (ref 3.5–5.1)
Sodium: 135 mmol/L (ref 135–145)
Total Bilirubin: 1 mg/dL (ref 0.3–1.2)
Total Protein: 7.8 g/dL (ref 6.5–8.1)

## 2018-03-10 LAB — URINALYSIS, ROUTINE W REFLEX MICROSCOPIC
Bilirubin Urine: NEGATIVE
GLUCOSE, UA: NEGATIVE mg/dL
HGB URINE DIPSTICK: NEGATIVE
Ketones, ur: 20 mg/dL — AB
Leukocytes, UA: NEGATIVE
Nitrite: NEGATIVE
PH: 5 (ref 5.0–8.0)
Protein, ur: NEGATIVE mg/dL
SPECIFIC GRAVITY, URINE: 1.023 (ref 1.005–1.030)

## 2018-03-10 LAB — LIPASE, BLOOD: LIPASE: 68 U/L — AB (ref 11–51)

## 2018-03-10 MED ORDER — POLYETHYLENE GLYCOL 3350 17 G PO PACK: 17.0000 g | PACK | Freq: Every day | ORAL | 0 refills | Status: DC

## 2018-03-10 NOTE — ED Notes (Signed)
Pt back from x-ray.

## 2018-03-10 NOTE — ED Triage Notes (Signed)
Pt c/o abd pain x 1 week.  Reports no  bm in one week.  Was here recently for same.  Denies n/v.

## 2018-03-10 NOTE — Discharge Instructions (Signed)
Your labs and xrays are stable today, but your pancreas enzyme is slightly elevated suggesting you do continue to have a small amount of pancreas inflammation.  You do not have a bowel obstruction.  You may take the miralax however to help you have softer, more regular stools.  Pain pills can cause problems with constipation so minimize your use of these.  Maintain a bland diet as your pancreas continues to settle down.  Call your doctor for a recheck this week if your symptoms persist, returning here for any worsened symptoms (fever, uncontrolled vomiting,pain or weakness).

## 2018-03-10 NOTE — ED Notes (Signed)
Pt given urine specimen cup, states he will let us know when he has urinated

## 2018-03-10 NOTE — ED Provider Notes (Signed)
[facility] EMERGENCY DEPARTMENT Provider Note   CSN: [id number] Arrival date & time: 03/10/18  0847     History   Chief Complaint Chief Complaint  Patient presents with  . Abdominal Pain    HPI [name] is a 52 y.o. male with DM, h/o MI, chronic back pain and history of etoh induced pancreatitis returns today for complaint of persistent abdominal pain in association with complaint of constipation.  He reports no bm x 1 week and has generalized abdominal pain, described as cramping and localizing pain in his LUQ c/w his pancreatitis.  He was seen here 3 days ago and labs and CT imaging was stable but with suggesting possible early pancreatic inflammation.  He had maintained clear liquid diet for one day, but was hungry so ate a full meal 2 nights ago then ate a whole pizza yesterday, but still no bm (and also no worsening of pain).  He reports having an intestinal blockage in the 1980's and is concerned this is happening again.  He denies n/v, fever, back pain, dysuria, diarrhea, chest pain and sob.  He has found no alleviators for his sx.   The history is provided by the patient.    Past Medical History:  Diagnosis Date  . Anxiety   . Borderline diabetes   . Bulging disc   . Chronic back pain   . Diabetes mellitus without complication ([place])   . Hypercholesteremia   . Myocardial infarction ([place])   . Pancreatitis     Patient Active Problem List   Diagnosis Date Noted  . Angina, class II ([place]) - Low Risk ST, but persistent chest pain. 05/08/2017  . History of colonic polyps   . Diverticulosis of colon without hemorrhage   . ST-segment elevation myocardial infarction (STEMI) of inferior wall ([place]) 03/14/2014  . Presence of bare metal stent in right coronary artery: VeriFlex BMS 5.0 mm x 15 mm (5.6 mm) 03/14/2014    Class: Status post  . CAD S/P percutaneous coronary angioplasty 03/14/2014  . Chest pain 03/13/2014  . Pancreatitis 01/19/2014  . Hyperlipidemia associated  with type 2 diabetes mellitus ([place]) 01/19/2014  . Diabetes mellitus ([place]) 01/19/2014    Past Surgical History:  Procedure Laterality Date  . ABDOMINAL SURGERY    . APPENDECTOMY    . BACK SURGERY    . CARDIAC CATHETERIZATION  2003   "trivial coronary artery disease," normal EF  . COLONOSCOPY N/A 04/16/2015   Procedure: COLONOSCOPY;  Surgeon: Daneil Dolin, MD;  [facility];  Service: Endoscopy;  Laterality: N/A;  12:45 PM  . LEFT HEART CATH AND CORONARY ANGIOGRAPHY N/A 05/09/2017   Procedure: LEFT HEART CATH AND CORONARY ANGIOGRAPHY;  Surgeon: Leonie Man, MD;  Location: [place] CV LAB;  Service: Cardiovascular;  Laterality: N/A;  . LEFT HEART CATHETERIZATION WITH CORONARY ANGIOGRAM N/A 03/13/2014   Procedure: LEFT HEART CATHETERIZATION WITH CORONARY ANGIOGRAM;  Surgeon: Leonie Man, MD;  Location: [facility] CATH LAB;  Service: Cardiovascular;  Laterality: N/A;        Home Medications    Prior to Admission medications   Medication Sig Start Date End Date Taking? Authorizing Provider  ALPRAZolam Duanne Moron) 1 MG tablet Take 1 tablet by mouth 2 (two) times daily.  05/02/17  Yes [provider]  aspirin 81 MG chewable tablet Chew 1 tablet (81 mg total) by mouth daily. 03/15/14  Yes Eileen Stanford, PA-C  calcium carbonate (TUMS - DOSED IN MG ELEMENTAL CALCIUM) 500 MG  chewable tablet Chew 1 tablet by mouth daily.   Yes [provider]  carvedilol (COREG) 3.125 MG tablet TAKE 1 TABLET(3.125 MG) BY MOUTH TWICE DAILY WITH A MEAL 07/31/17  Yes Branch, Alphonse Guild, MD  furosemide (LASIX) 20 MG tablet TAKE 1 TABLET BY MOUTH AS NEEDED FOR SWELLING 02/08/18  Yes Branch, Alphonse Guild, MD  ibuprofen (ADVIL,MOTRIN) 200 MG tablet Take 400 mg by mouth every 6 (six) hours as needed for fever, headache or mild pain.   Yes [provider]  isosorbide mononitrate (IMDUR) 30 MG 24 hr tablet Take 1.5 tablets (45 mg total) by mouth daily. 11/23/17  Yes Branch, Alphonse Guild, MD    lisinopril (PRINIVIL,ZESTRIL) 2.5 MG tablet TAKE 1 TABLET(2.5 MG) BY MOUTH DAILY 02/07/18  Yes Branch, Alphonse Guild, MD  nitroGLYCERIN (NITROSTAT) 0.4 MG SL tablet Place 1 tablet (0.4 mg total) under the tongue every 5 (five) minutes as needed for chest pain. 03/02/17  Yes Branch, Alphonse Guild, MD  ondansetron (ZOFRAN) 4 MG tablet Take 1 tablet (4 mg total) by mouth every 8 (eight) hours as needed for nausea or vomiting. 03/07/18  Yes Mesner, Corene Cornea, MD  oxyCODONE-acetaminophen (PERCOCET) 5-325 MG tablet Take 1 tablet by mouth every 8 (eight) hours as needed for severe pain. 03/07/18  Yes Mesner, Corene Cornea, MD  pantoprazole (PROTONIX) 20 MG tablet Take 1 tablet (20 mg total) by mouth daily. 03/07/18  Yes Mesner, Corene Cornea, MD  pravastatin (PRAVACHOL) 20 MG tablet Take 1 tablet (20 mg total) by mouth every evening. 12/06/17 03/10/18 Yes Branch, Alphonse Guild, MD  ranitidine (ZANTAC) 150 MG tablet Take 1 tablet (150 mg total) by mouth 2 (two) times daily. 03/07/18  Yes Mesner, Corene Cornea, MD  metFORMIN (GLUCOPHAGE) 500 MG tablet Take 1 tablet by mouth 2 (two) times daily as needed.  01/31/18   [provider]  polyethylene glycol (MIRALAX / GLYCOLAX) packet Take 17 g by mouth daily. 03/10/18   Evalee Jefferson, PA-C    Family History Family History  Problem Relation Age of Onset  . Diabetes Father   . Hyperlipidemia Father   . Hypertension Father   . Hypertension Mother   . Hyperlipidemia Mother   . Hyperlipidemia Sister   . Hypertension Sister   . Diabetes Sister     Social History Social History   Tobacco Use  . Smoking status: Current Every Day Smoker    Packs/day: 0.50    Years: 30.00    Pack years: 15.00    Types: Cigarettes    Start date: 10/25/1977  . Smokeless tobacco: Never Used  Substance Use Topics  . Alcohol use: Yes    Alcohol/week: 6.0 standard drinks    Types: 6 Cans of beer per week  . Drug use: No     Allergies   Patient has no known allergies.   Review of Systems Review of  Systems  Constitutional: Negative for chills and fever.  HENT: Negative for congestion and sore throat.   Eyes: Negative.   Respiratory: Negative for chest tightness and shortness of breath.   Cardiovascular: Negative for chest pain.  Gastrointestinal: Positive for abdominal pain and constipation. Negative for abdominal distention, diarrhea, nausea and vomiting.  Genitourinary: Negative.   Musculoskeletal: Negative for arthralgias, joint swelling and neck pain.  Skin: Negative.   Neurological: Negative for weakness.  Psychiatric/Behavioral: Negative.      Physical Exam Updated Vital Signs BP 109/72   Pulse 78   Temp 98.7 F (37.1 C) (Oral)   Resp 15  Ht 6\' 1"  (1.854 m)   Wt 98.4 kg   SpO2 97%   BMI 28.63 kg/m   Physical Exam  Constitutional: He appears well-developed and well-nourished.  HENT:  Head: Normocephalic and atraumatic.  Eyes: Conjunctivae are normal.  Neck: Normal range of motion.  Cardiovascular: Normal rate, regular rhythm, normal heart sounds and intact distal pulses.  Pulmonary/Chest: Effort normal and breath sounds normal. He has no wheezes.  Abdominal: Soft. Normal aorta and bowel sounds are normal. He exhibits no distension and no mass. There is tenderness in the left upper quadrant. There is no rigidity, no rebound, no guarding and negative Murphy's sign.  Musculoskeletal: Normal range of motion.  Neurological: He is alert.  Skin: Skin is warm and dry.  Psychiatric: He has a normal mood and affect.  Nursing note and vitals reviewed.    ED Treatments / Results  Labs (all labs ordered are listed, but only abnormal results are displayed) Labs Reviewed  CBC WITH DIFFERENTIAL/PLATELET - Abnormal; Notable for the following components:      Result Value   Platelets 134 (*)    All other components within normal limits  COMPREHENSIVE METABOLIC PANEL - Abnormal; Notable for the following components:   Potassium 3.2 (*)    Glucose, Bld 163 (*)    All  other components within normal limits  LIPASE, BLOOD - Abnormal; Notable for the following components:   Lipase 68 (*)    All other components within normal limits  URINALYSIS, ROUTINE W REFLEX MICROSCOPIC - Abnormal; Notable for the following components:   Ketones, ur 20 (*)    All other components within normal limits    EKG None  Radiology Dg Abd Acute W/chest  Result Date: 03/10/2018 CLINICAL DATA:  Abdominal pain and constipation. EXAM: DG ABDOMEN ACUTE W/ 1V CHEST COMPARISON:  CT abdomen pelvis and chest x-ray dated March 07, 2018. FINDINGS: The cardiomediastinal silhouette is normal in size. Normal pulmonary vascularity. No focal consolidation, pleural effusion, or pneumothorax. There is no evidence of dilated bowel loops or free intraperitoneal air. Mild stool burden in the right colon, unchanged. No radiopaque calculi or other significant radiographic abnormality is seen. No acute osseous abnormality. IMPRESSION: 1. Mild stool burden in the right colon, unchanged.  No obstruction. 2.  No active cardiopulmonary disease. Electronically Signed   By: Titus Dubin M.D.   On: 03/10/2018 10:01    Procedures Procedures (including critical care time)  Medications Ordered in ED Medications - No data to display   Initial Impression / Assessment and Plan / ED Course  I have reviewed the triage vital signs and the nursing notes.  Pertinent labs & imaging results that were available during my care of the patient were reviewed by me and considered in my medical decision making (see chart for details).     Pt with RUQ pain and mild elevation in lipase and pancreatic inflammation per CT imaging from visit on 8/28. Exam is reassuring with no guarding, non acute abd, acute abd series today confirms no obstruction with normal bowel gas pattern.  No indication for repeat Ct imaging today.  No increased stool burden on todays imaging but with no bm x 1 week and pt on chronic narcotic pain med,  will add miralax to improve stooling pattern.  He was advised to continue with a bland/ liquid diet until his sx improve as there is a mild degree of pancreatic inflammation at this time.  Plan recheck by pcp if sx persist or worsen.  The patient appears reasonably screened and/or stabilized for discharge and I doubt any other medical condition or other [facility] [employer] requiring further screening, evaluation, or treatment in the ED at this time prior to discharge.   Final Clinical Impressions(s) / ED Diagnoses   Final diagnoses:  Left upper quadrant pain  Constipation, unspecified constipation type    ED Discharge Orders         Ordered    polyethylene glycol (MIRALAX / GLYCOLAX) packet  Daily     03/10/18 1144           Evalee Jefferson, PA-C 03/10/18 2352    Fredia Sorrow, MD 03/11/18 1024

## 2018-04-17 ENCOUNTER — Ambulatory Visit: Payer: Medicaid Other | Admitting: Internal Medicine

## 2018-04-24 ENCOUNTER — Ambulatory Visit: Payer: Self-pay | Admitting: Internal Medicine

## 2018-04-24 ENCOUNTER — Other Ambulatory Visit: Payer: Self-pay

## 2018-04-24 ENCOUNTER — Telehealth: Payer: Self-pay

## 2018-04-24 ENCOUNTER — Encounter: Payer: Self-pay | Admitting: Internal Medicine

## 2018-04-24 VITALS — BP 105/66 | HR 73 | Temp 97.0°F | Ht 73.0 in | Wt 222.2 lb

## 2018-04-24 DIAGNOSIS — Z86010 Personal history of colonic polyps: Secondary | ICD-10-CM

## 2018-04-24 NOTE — Patient Instructions (Signed)
Schedule a surveillance colonoscopy - hx of colon polyps - propofol  Limit alcohol consumption   Further recommendations to follow

## 2018-04-24 NOTE — Telephone Encounter (Signed)
Tried to call pt to inform of pre-op appt 05/24/18 at 12:45pm, no answer, LMOVM. Letter mailed.

## 2018-04-24 NOTE — Progress Notes (Signed)
Primary Care Physician:  Sharilyn Sites, MD Primary Gastroenterologist:  Dr. Gala Romney  Pre-Procedure History & Physical: HPI:  [name] is a 52 y.o. male here for follow-up - surveillance colonoscopy.  Had 3 adenomas-one 1 cm or greater removed at  colonoscopy 3 years ago.  He is due for surveillance at this time.  Is not having any bleeding; he is occasionally constipated for which he takes MiraLAX. Infrequent GERD symptoms which he takes antiacids.  He does drink beer  -  sometimes in large quantities.  I note that he has been evaluated in the ED with abdominal pain twice this past year.  Both episodes he appears to have uncomplicated pancreatitis. Past Medical History:  Diagnosis Date  . Anxiety   . Borderline diabetes   . Bulging disc   . Chronic back pain   . Diabetes mellitus without complication ([place])   . Hypercholesteremia   . Myocardial infarction ([place])   . Pancreatitis     Past Surgical History:  Procedure Laterality Date  . ABDOMINAL SURGERY    . APPENDECTOMY    . BACK SURGERY    . CARDIAC CATHETERIZATION  2003   "trivial coronary artery disease," normal EF  . COLONOSCOPY N/A 04/16/2015   Procedure: COLONOSCOPY;  Surgeon: Daneil Dolin, MD;  Location: [facility];  Service: Endoscopy;  Laterality: N/A;  12:45 PM  . LEFT HEART CATH AND CORONARY ANGIOGRAPHY N/A 05/09/2017   Procedure: LEFT HEART CATH AND CORONARY ANGIOGRAPHY;  Surgeon: Leonie Man, MD;  Location: [facility];  Service: Cardiovascular;  Laterality: N/A;  . LEFT HEART CATHETERIZATION WITH CORONARY ANGIOGRAM N/A 03/13/2014   Procedure: LEFT HEART CATHETERIZATION WITH CORONARY ANGIOGRAM;  Surgeon: Leonie Man, MD;  Location: [facility] CATH LAB;  Service: Cardiovascular;  Laterality: N/A;    Prior to Admission medications   Medication Sig Start Date End Date Taking? Authorizing Provider  ALPRAZolam Duanne Moron) 1 MG tablet Take 1 tablet by mouth 3 (three) times daily.  05/02/17  Yes [provider]  aspirin 81 MG chewable tablet Chew 1 tablet (81 mg total) by mouth daily. 03/15/14  Yes Eileen Stanford, PA-C  calcium carbonate (TUMS - DOSED IN MG ELEMENTAL CALCIUM) 500 MG chewable tablet Chew 1 tablet by mouth as needed.    Yes [provider]  carvedilol (COREG) 3.125 MG tablet TAKE 1 TABLET(3.125 MG) BY MOUTH TWICE DAILY WITH A MEAL 07/31/17  Yes Branch, Alphonse Guild, MD  furosemide (LASIX) 20 MG tablet TAKE 1 TABLET BY MOUTH AS NEEDED FOR SWELLING Patient taking differently: Take 20 mg by mouth daily. TAKE 1 TABLET BY MOUTH AS NEEDED FOR SWELLING 02/08/18  Yes Branch, Alphonse Guild, MD  ibuprofen (ADVIL,MOTRIN) 200 MG tablet Take 800 mg by mouth every 6 (six) hours as needed for fever, headache or mild pain.    Yes [provider]  isosorbide mononitrate (IMDUR) 30 MG 24 hr tablet Take 1.5 tablets (45 mg total) by mouth daily. 11/23/17  Yes Branch, Alphonse Guild, MD  lisinopril (PRINIVIL,ZESTRIL) 2.5 MG tablet TAKE 1 TABLET(2.5 MG) BY MOUTH DAILY 02/07/18  Yes Branch, Alphonse Guild, MD  metFORMIN (GLUCOPHAGE) 500 MG tablet Take 1 tablet by mouth 2 (two) times daily.  01/31/18  Yes [provider]  nitroGLYCERIN (NITROSTAT) 0.4 MG SL tablet Place 1 tablet (0.4 mg total) under the tongue every 5 (five) minutes as needed for chest pain. 03/02/17  Yes Branch, Alphonse Guild, MD  oxyCODONE-acetaminophen (PERCOCET) 5-325 MG  tablet Take 1 tablet by mouth every 8 (eight) hours as needed for severe pain. 03/07/18  Yes Mesner, Corene Cornea, MD  polyethylene glycol (MIRALAX / GLYCOLAX) packet Take 17 g by mouth daily. Patient taking differently: Take 17 g by mouth daily as needed.  03/10/18  Yes Idol, Almyra Free, PA-C  pravastatin (PRAVACHOL) 20 MG tablet Take 1 tablet (20 mg total) by mouth every evening. 12/06/17 04/24/18 Yes Branch, Alphonse Guild, MD  ondansetron (ZOFRAN) 4 MG tablet Take 1 tablet (4 mg total) by mouth every 8 (eight) hours as needed for nausea or vomiting. Patient not taking:  Reported on 04/24/2018 03/07/18   Mesner, Corene Cornea, MD  pantoprazole (PROTONIX) 20 MG tablet Take 1 tablet (20 mg total) by mouth daily. Patient not taking: Reported on 04/24/2018 03/07/18   Mesner, Corene Cornea, MD  ranitidine (ZANTAC) 150 MG tablet Take 1 tablet (150 mg total) by mouth 2 (two) times daily. Patient not taking: Reported on 04/24/2018 03/07/18   Merrily Pew, MD    Allergies as of 04/24/2018  . (No Known Allergies)    Family History  Problem Relation Age of Onset  . Diabetes Father   . Hyperlipidemia Father   . Hypertension Father   . Hypertension Mother   . Hyperlipidemia Mother   . Hyperlipidemia Sister   . Hypertension Sister   . Diabetes Sister     Social History   Socioeconomic History  . Marital status: Single    Spouse name: Not on file  . Number of children: Not on file  . Years of education: Not on file  . Highest education level: Not on file  Occupational History  . Not on file  Social Needs  . Financial resource strain: Not on file  . Food insecurity:    Worry: Not on file    Inability: Not on file  . Transportation needs:    Medical: Not on file    Non-medical: Not on file  Tobacco Use  . Smoking status: Current Every Day Smoker    Packs/day: 0.50    Years: 30.00    Pack years: 15.00    Types: Cigarettes    Start date: 10/25/1977  . Smokeless tobacco: Never Used  Substance and Sexual Activity  . Alcohol use: Yes    Alcohol/week: 6.0 standard drinks    Types: 6 Cans of beer per week  . Drug use: No  . Sexual activity: Yes  Lifestyle  . Physical activity:    Days per week: Not on file    Minutes per session: Not on file  . Stress: Not on file  Relationships  . Social connections:    Talks on phone: Not on file    Gets together: Not on file    Attends religious service: Not on file    Active member of club or organization: Not on file    Attends meetings of clubs or organizations: Not on file    Relationship status: Not on file  .  Intimate partner violence:    Fear of current or ex partner: Not on file    Emotionally abused: Not on file    Physically abused: Not on file    Forced sexual activity: Not on file  Other Topics Concern  . Not on file  Social History Narrative  . Not on file    Review of Systems: See HPI, otherwise negative ROS  Physical Exam: BP 105/66   Pulse 73   Temp (!) 97 F (36.1 C) (Oral)  Ht 6\' 1"  (1.854 m)   Wt 222 lb 3.2 oz (100.8 kg)   BMI 29.32 kg/m  General:   Alert,  pleasant and cooperative in NAD Lungs:  Clear throughout to auscultation.   No wheezes, crackles, or rhonchi. No acute distress. Heart:  Regular rate and rhythm; no murmurs, clicks, rubs,  or gallops. Abdomen: Non-distended, normal bowel sounds.  Soft and nontender without appreciable mass or hepatosplenomegaly.  Pulses:  Normal pulses noted. Extremities:  Without clubbing or edema.  Impression/Plan: 52 year old gentleman with a history of multiple colonic adenomas removed back in 2016.  Due for surveillance examination at this time.  Has only occasional GERD.  He has had abdominal pain this year felt to be related to pancreatitis - likely related to alcohol use.  Occasional constipation.  No GI symptoms at this time.  Recommendations:   I have offered the  patient a surveillance colonoscopy utilizing propofol in the near future.  The risks, benefits, limitations, alternatives and imponderables have been reviewed with the patient. Questions have been answered. All parties are agreeable. chedule a surveillance colonoscopy - hx of colon polyps - propofol  Was urged to limit his alcohol consumption    Further recommendations to follow     Notice: This dictation was prepared with Dragon dictation along with smaller phrase technology. Any transcriptional errors that result from this process are unintentional and may not be corrected upon review.

## 2018-05-24 ENCOUNTER — Encounter (HOSPITAL_COMMUNITY)
Admission: RE | Admit: 2018-05-24 | Discharge: 2018-05-24 | Disposition: A | Discharge status: Home / Self Care | Payer: Self-pay | Source: Ambulatory Visit | Attending: Internal Medicine | Admitting: Internal Medicine

## 2018-05-24 ENCOUNTER — Encounter (HOSPITAL_COMMUNITY): Payer: Self-pay

## 2018-05-31 ENCOUNTER — Encounter (HOSPITAL_COMMUNITY): Payer: Self-pay | Admitting: Anesthesiology

## 2018-05-31 ENCOUNTER — Telehealth: Payer: Self-pay | Admitting: Internal Medicine

## 2018-05-31 ENCOUNTER — Ambulatory Visit (HOSPITAL_COMMUNITY)
Admission: RE | Admit: 2018-05-31 | Discharge: 2018-05-31 | Disposition: A | Discharge status: Home / Self Care | Payer: Medicaid Other | Source: Ambulatory Visit | Attending: Internal Medicine | Admitting: Internal Medicine

## 2018-05-31 ENCOUNTER — Encounter (HOSPITAL_COMMUNITY)
Admission: RE | Disposition: A | Discharge status: Home / Self Care | Payer: Self-pay | Source: Ambulatory Visit | Attending: Internal Medicine

## 2018-05-31 ENCOUNTER — Other Ambulatory Visit: Payer: Self-pay

## 2018-05-31 ENCOUNTER — Encounter (HOSPITAL_COMMUNITY): Payer: Self-pay | Admitting: *Deleted

## 2018-05-31 DIAGNOSIS — Z538 Procedure and treatment not carried out for other reasons: Secondary | ICD-10-CM | POA: Insufficient documentation

## 2018-05-31 DIAGNOSIS — Z86010 Personal history of colonic polyps: Secondary | ICD-10-CM | POA: Insufficient documentation

## 2018-05-31 LAB — GLUCOSE, CAPILLARY: Glucose-Capillary: 171 mg/dL — ABNORMAL HIGH (ref 70–99)

## 2018-05-31 SURGERY — COLONOSCOPY WITH PROPOFOL
Anesthesia: Monitor Anesthesia Care

## 2018-05-31 MED ORDER — CHLORHEXIDINE GLUCONATE CLOTH 2 % EX PADS: 6.0000 | MEDICATED_PAD | Freq: Once | CUTANEOUS | Status: DC

## 2018-05-31 NOTE — Telephone Encounter (Signed)
Pt came by office. TCS w/Propofol w/RMR rescheduled to 06/28/18 at 9:45am. Spoke to endo scheduler. Pre-op appt 06/21/18 at 1:45pm. Suprep sample given. Instructions given and reviewed, he verbalized understanding. Pre-op letter given. Orders entered.

## 2018-05-31 NOTE — Final Progress Note (Signed)
Upon arrival, pt. Stated that he ate a double cheeseburger for lunch yesterday on 05/30/18. Pt. Was to be NPO except for clear liquids starting 05/29/18 @ 1400.  Dr. Gala Romney informed & cancelled pt.  Office called to make aware & to notify pt. When rescheduled.  Pt. Verbalized understanding of situation.

## 2018-05-31 NOTE — Telephone Encounter (Signed)
Called pt, he will come by office to pick up sample prep and reschedule TCS.

## 2018-05-31 NOTE — Telephone Encounter (Signed)
Christine from [employer] called to say patient needed to reschedule his procedure for today because he ate lunch yesterday.

## 2018-06-20 ENCOUNTER — Encounter (HOSPITAL_COMMUNITY): Payer: Self-pay

## 2018-06-21 ENCOUNTER — Encounter (HOSPITAL_COMMUNITY)
Admission: RE | Admit: 2018-06-21 | Discharge: 2018-06-21 | Disposition: A | Discharge status: Home / Self Care | Payer: Medicaid Other | Source: Ambulatory Visit | Attending: Internal Medicine | Admitting: Internal Medicine

## 2018-06-27 ENCOUNTER — Telehealth: Payer: Self-pay | Admitting: Cardiology

## 2018-06-27 NOTE — Telephone Encounter (Signed)
Patient c/o having a dull, intermittent chest pain for the past 4-5 days rated 4/10. Has not used nitroglycerin. No c/o dizziness or sob. Patient given an appointment to see Dr. Harl Bowie on 07/03/18 and advised if his symptoms get worse between now and then, to go to the ED for an evaluation. Verbalized understanding of plan.

## 2018-06-27 NOTE — Telephone Encounter (Signed)
Dull pain in chest for last several days.  Hurts when sitting down and he takes a deep breath  No chest pain at time of call

## 2018-06-28 ENCOUNTER — Ambulatory Visit (HOSPITAL_COMMUNITY): Payer: Medicaid Other | Admitting: Anesthesiology

## 2018-06-28 ENCOUNTER — Other Ambulatory Visit: Payer: Self-pay

## 2018-06-28 ENCOUNTER — Encounter (HOSPITAL_COMMUNITY)
Admission: RE | Disposition: A | Discharge status: Home / Self Care | Payer: Self-pay | Source: Home / Self Care | Attending: Internal Medicine

## 2018-06-28 ENCOUNTER — Encounter (HOSPITAL_COMMUNITY): Payer: Self-pay | Admitting: *Deleted

## 2018-06-28 ENCOUNTER — Ambulatory Visit (HOSPITAL_COMMUNITY)
Admission: RE | Admit: 2018-06-28 | Discharge: 2018-06-28 | Disposition: A | Discharge status: Home / Self Care | Payer: Medicaid Other | Attending: Internal Medicine | Admitting: Internal Medicine

## 2018-06-28 DIAGNOSIS — E7800 Pure hypercholesterolemia, unspecified: Secondary | ICD-10-CM | POA: Insufficient documentation

## 2018-06-28 DIAGNOSIS — F419 Anxiety disorder, unspecified: Secondary | ICD-10-CM | POA: Diagnosis not present

## 2018-06-28 DIAGNOSIS — Z7984 Long term (current) use of oral hypoglycemic drugs: Secondary | ICD-10-CM | POA: Insufficient documentation

## 2018-06-28 DIAGNOSIS — Z86010 Personal history of colonic polyps: Secondary | ICD-10-CM | POA: Diagnosis not present

## 2018-06-28 DIAGNOSIS — K5730 Diverticulosis of large intestine without perforation or abscess without bleeding: Secondary | ICD-10-CM | POA: Diagnosis not present

## 2018-06-28 DIAGNOSIS — D122 Benign neoplasm of ascending colon: Secondary | ICD-10-CM | POA: Insufficient documentation

## 2018-06-28 DIAGNOSIS — Z79899 Other long term (current) drug therapy: Secondary | ICD-10-CM | POA: Diagnosis not present

## 2018-06-28 DIAGNOSIS — Z1211 Encounter for screening for malignant neoplasm of colon: Secondary | ICD-10-CM | POA: Diagnosis present

## 2018-06-28 DIAGNOSIS — Z791 Long term (current) use of non-steroidal anti-inflammatories (NSAID): Secondary | ICD-10-CM | POA: Diagnosis not present

## 2018-06-28 DIAGNOSIS — I252 Old myocardial infarction: Secondary | ICD-10-CM | POA: Diagnosis not present

## 2018-06-28 DIAGNOSIS — F17210 Nicotine dependence, cigarettes, uncomplicated: Secondary | ICD-10-CM | POA: Diagnosis not present

## 2018-06-28 DIAGNOSIS — Z7982 Long term (current) use of aspirin: Secondary | ICD-10-CM | POA: Insufficient documentation

## 2018-06-28 DIAGNOSIS — E119 Type 2 diabetes mellitus without complications: Secondary | ICD-10-CM | POA: Insufficient documentation

## 2018-06-28 HISTORY — PX: POLYPECTOMY: SHX5525

## 2018-06-28 HISTORY — PX: COLONOSCOPY WITH PROPOFOL: SHX5780

## 2018-06-28 LAB — GLUCOSE, CAPILLARY: Glucose-Capillary: 108 mg/dL — ABNORMAL HIGH (ref 70–99)

## 2018-06-28 SURGERY — COLONOSCOPY WITH PROPOFOL
Anesthesia: Monitor Anesthesia Care

## 2018-06-28 MED ORDER — CHLORHEXIDINE GLUCONATE CLOTH 2 % EX PADS: 6.0000 | MEDICATED_PAD | Freq: Once | CUTANEOUS | Status: DC

## 2018-06-28 MED ORDER — PROPOFOL 10 MG/ML IV BOLUS
INTRAVENOUS | Status: DC | PRN
  Administered 2018-06-28: 40 mg via INTRAVENOUS

## 2018-06-28 MED ORDER — LACTATED RINGERS IV SOLN: INTRAVENOUS | Status: DC

## 2018-06-28 MED ORDER — PROPOFOL 500 MG/50ML IV EMUL
INTRAVENOUS | Status: DC | PRN
  Administered 2018-06-28: 150 ug/kg/min via INTRAVENOUS

## 2018-06-28 MED ORDER — STERILE WATER FOR IRRIGATION IR SOLN
Status: DC | PRN
  Administered 2018-06-28: 1.5 mL

## 2018-06-28 MED ORDER — LACTATED RINGERS IV SOLN
INTRAVENOUS | Status: DC | PRN
  Administered 2018-06-28: 09:00:00 via INTRAVENOUS

## 2018-06-28 NOTE — Discharge Instructions (Signed)
Colonoscopy Discharge Instructions  Read the instructions outlined below and refer to this sheet in the next few weeks. These discharge instructions provide you with general information on caring for yourself after you leave the hospital. Your doctor may also give you specific instructions. While your treatment has been planned according to the most current medical practices available, unavoidable complications occasionally occur. If you have any problems or questions after discharge, call Dr. Gala Romney at [phone]. ACTIVITY  You may resume your regular activity, but move at a slower pace for the next 24 hours.   Take frequent rest periods for the next 24 hours.   Walking will help get rid of the air and reduce the bloated feeling in your belly (abdomen).   No driving for 24 hours (because of the medicine (anesthesia) used during the test).    Do not sign any important legal documents or operate any machinery for 24 hours (because of the anesthesia used during the test).  NUTRITION  Drink plenty of fluids.   You may resume your normal diet as instructed by your doctor.   Begin with a light meal and progress to your normal diet. Heavy or fried foods are harder to digest and may make you feel sick to your stomach (nauseated).   Avoid alcoholic beverages for 24 hours or as instructed.  MEDICATIONS  You may resume your normal medications unless your doctor tells you otherwise.  WHAT YOU CAN EXPECT TODAY  Some feelings of bloating in the abdomen.   Passage of more gas than usual.   Spotting of blood in your stool or on the toilet paper.  IF YOU HAD POLYPS REMOVED DURING THE COLONOSCOPY:  No aspirin products for 7 days or as instructed.   No alcohol for 7 days or as instructed.   Eat a soft diet for the next 24 hours.  FINDING OUT THE RESULTS OF YOUR TEST Not all test results are available during your visit. If your test results are not back during the visit, make an appointment  with your caregiver to find out the results. Do not assume everything is normal if you have not heard from your caregiver or the medical facility. It is important for you to follow up on all of your test results.  SEEK IMMEDIATE MEDICAL ATTENTION IF:  You have more than a spotting of blood in your stool.   Your belly is swollen (abdominal distention).   You are nauseated or vomiting.   You have a temperature over 101.   You have abdominal pain or discomfort that is severe or gets worse throughout the day.   Colon Polyps  Polyps are tissue growths inside the body. Polyps can grow in many places, including the large intestine (colon). A polyp may be a round bump or a mushroom-shaped growth. You could have one polyp or several. Most colon polyps are noncancerous (benign). However, some colon polyps can become cancerous over time. Finding and removing the polyps early can help prevent this. What are the causes? The exact cause of colon polyps is not known. What increases the risk? You are more likely to develop this condition if you:  Have a family history of colon cancer or colon polyps.  Are older than 81 or older than 45 if you are African American.  Have inflammatory bowel disease, such as ulcerative colitis or Crohn's disease.  Have certain hereditary conditions, such as: ? Familial adenomatous polyposis. ? Lynch syndrome. ? Turcot syndrome. ? Peutz-Jeghers syndrome.  Are overweight.  Smoke cigarettes.  Do not get enough exercise.  Drink too much alcohol.  Eat a diet that is high in fat and red meat and low in fiber.  Had childhood cancer that was treated with abdominal radiation. What are the signs or symptoms? Most polyps do not cause symptoms. If you have symptoms, they may include:  Blood coming from your rectum when having a bowel movement.  Blood in your stool. The stool may look dark red or black.  Abdominal pain.  A change in bowel habits, such as  constipation or diarrhea. How is this diagnosed? This condition is diagnosed with a colonoscopy. This is a procedure in which a lighted, flexible scope is inserted into the anus and then passed into the colon to examine the area. Polyps are sometimes found when a colonoscopy is done as part of routine cancer screening tests. How is this treated? Treatment for this condition involves removing any polyps that are found. Most polyps can be removed during a colonoscopy. Those polyps will then be tested for cancer. Additional treatment may be needed depending on the results of testing. Follow these instructions at home: Lifestyle  Maintain a healthy weight, or lose weight if recommended by your health care provider.  Exercise every day or as told by your health care provider.  Do not use any products that contain nicotine or tobacco, such as cigarettes and e-cigarettes. If you need help quitting, ask your health care provider.  If you drink alcohol, limit how much you have: ? 0-1 drink a day for women. ? 0-2 drinks a day for men.  Be aware of how much alcohol is in your drink. In the [place] one drink equals one 12 oz bottle of beer (355 mL), one 5 oz glass of wine (148 mL), or one 1 oz shot of hard liquor (44 mL). Eating and drinking   Eat foods that are high in fiber, such as fruits, vegetables, and whole grains.  Eat foods that are high in calcium and vitamin D, such as milk, cheese, yogurt, eggs, liver, fish, and broccoli.  Limit foods that are high in fat, such as fried foods and desserts.  Limit the amount of red meat and processed meat you eat, such as hot dogs, sausage, bacon, and lunch meats. General instructions  Keep all follow-up visits as told by your health care provider. This is important. ? This includes having regularly scheduled colonoscopies. ? Talk to your health care provider about when you need a colonoscopy. Contact a health care provider if:  You have new or  worsening bleeding during a bowel movement.  You have new or increased blood in your stool.  You have a change in bowel habits.  You lose weight for no known reason. Summary  Polyps are tissue growths inside the body. Polyps can grow in many places, including the colon.  Most colon polyps are noncancerous (benign), but some can become cancerous over time.  This condition is diagnosed with a colonoscopy.  Treatment for this condition involves removing any polyps that are found. Most polyps can be removed during a colonoscopy. This information is not intended to replace advice given to you by your health care provider. Make sure you discuss any questions you have with your health care provider. Document Released: 03/23/2004 Document Revised: 10/12/2017 Document Reviewed: 10/12/2017 Elsevier Interactive Patient Education  2019 [employer].   Diverticulosis  Diverticulosis is a condition that develops when small pouches (diverticula) form in the wall of the large  intestine (colon). The colon is where water is absorbed and stool is formed. The pouches form when the inside layer of the colon pushes through weak spots in the outer layers of the colon. You may have a few pouches or many of them. What are the causes? The cause of this condition is not known. What increases the risk? The following factors may make you more likely to develop this condition:  Being older than age 36. Your risk for this condition increases with age. Diverticulosis is rare among people younger than age 70. By age 66, many people have it.  Eating a low-fiber diet.  Having frequent constipation.  Being overweight.  Not getting enough exercise.  Smoking.  Taking over-the-counter pain medicines, like aspirin and ibuprofen.  Having a family history of diverticulosis. What are the signs or symptoms? In most people, there are no symptoms of this condition. If you do have symptoms, they may  include:  Bloating.  Cramps in the abdomen.  Constipation or diarrhea.  Pain in the lower left side of the abdomen. How is this diagnosed? This condition is most often diagnosed during an exam for other colon problems. Because diverticulosis usually has no symptoms, it often cannot be diagnosed independently. This condition may be diagnosed by:  Using a flexible scope to examine the colon (colonoscopy).  Taking an X-ray of the colon after dye has been put into the colon (barium enema).  Doing a CT scan. How is this treated? You may not need treatment for this condition if you have never developed an infection related to diverticulosis. If you have had an infection before, treatment may include:  Eating a high-fiber diet. This may include eating more fruits, vegetables, and grains.  Taking a fiber supplement.  Taking a live bacteria supplement (probiotic).  Taking medicine to relax your colon.  Taking antibiotic medicines. Follow these instructions at home:  Drink 6-8 glasses of water or more each day to prevent constipation.  Try not to strain when you have a bowel movement.  If you have had an infection before: ? Eat more fiber as directed by your health care provider or your diet and nutrition specialist (dietitian). ? Take a fiber supplement or probiotic, if your health care provider approves.  Take over-the-counter and prescription medicines only as told by your health care provider.  If you were prescribed an antibiotic, take it as told by your health care provider. Do not stop taking the antibiotic even if you start to feel better.  Keep all follow-up visits as told by your health care provider. This is important. Contact a health care provider if:  You have pain in your abdomen.  You have bloating.  You have cramps.  You have not had a bowel movement in 3 days. Get help right away if:  Your pain gets worse.  Your bloating becomes very bad.  You have a  fever or chills, and your symptoms suddenly get worse.  You vomit.  You have bowel movements that are bloody or black.  You have bleeding from your rectum. Summary  Diverticulosis is a condition that develops when small pouches (diverticula) form in the wall of the large intestine (colon).  You may have a few pouches or many of them.  This condition is most often diagnosed during an exam for other colon problems.  If you have had an infection related to diverticulosis, treatment may include increasing the fiber in your diet, taking supplements, or taking medicines. This information is  not intended to replace advice given to you by your health care provider. Make sure you discuss any questions you have with your health care provider. Document Released: 03/24/2004 Document Revised: 05/16/2016 Document Reviewed: 05/16/2016 Elsevier Interactive Patient Education  2019 [employer].   Colon polyp and diverticulosis information provided  Further recommendations to follow pending review of pathology report

## 2018-06-28 NOTE — Anesthesia Preprocedure Evaluation (Signed)
Anesthesia Evaluation  Patient identified by MRN, date of birth, ID band Patient awake    Reviewed: Allergy & Precautions, H&P , NPO status , Patient's Chart, lab work & pertinent test results, reviewed documented beta blocker date and time   Airway Mallampati: II  TM Distance: >3 FB Neck ROM: full    Dental  (+) Edentulous Lower, Edentulous Upper   Pulmonary neg pulmonary ROS, Current Smoker,    Pulmonary exam normal breath sounds clear to auscultation       Cardiovascular Exercise Tolerance: Good + angina + CAD and + Past MI   Rhythm:regular Rate:Normal     Neuro/Psych Anxiety negative neurological ROS  negative psych ROS   GI/Hepatic negative GI ROS, Neg liver ROS,   Endo/Other  negative endocrine ROSdiabetes  Renal/GU negative Renal ROS  negative genitourinary   Musculoskeletal   Abdominal   Peds  Hematology negative hematology ROS (+)   Anesthesia Other Findings   Reproductive/Obstetrics negative OB ROS                             Anesthesia Physical Anesthesia Plan  ASA: III  Anesthesia Plan: MAC   Post-op Pain Management:    Induction:   PONV Risk Score and Plan:   Airway Management Planned:   Additional Equipment:   Intra-op Plan:   Post-operative Plan:   Informed Consent: I have reviewed the patients History and Physical, chart, labs and discussed the procedure including the risks, benefits and alternatives for the proposed anesthesia with the patient or authorized representative who has indicated his/her understanding and acceptance.   Dental Advisory Given  Plan Discussed with: CRNA  Anesthesia Plan Comments:         Anesthesia Quick Evaluation

## 2018-06-28 NOTE — Anesthesia Procedure Notes (Signed)
Procedure Name: MAC Performed by: Adams, Amy A, CRNA Pre-anesthesia Checklist: Patient identified, Emergency Drugs available, Suction available, Patient being monitored and Timeout performed Oxygen Delivery Method: Simple face mask       

## 2018-06-28 NOTE — Transfer of Care (Signed)
Immediate Anesthesia Transfer of Care Note  Patient: [name]  Procedure(s) Performed: COLONOSCOPY WITH PROPOFOL (N/A ) POLYPECTOMY  Patient Location: PACU  Anesthesia Type:MAC  Level of Consciousness: awake, alert , oriented and patient cooperative  Airway & Oxygen Therapy: Patient Spontanous Breathing  Post-op Assessment: Report given to RN and Post -op Vital signs reviewed and stable  Post vital signs: Reviewed and stable  Last Vitals:  Vitals Value Taken Time  BP 98/65 06/28/2018 10:34 AM  Temp 36.8 C 06/28/2018 10:34 AM  Pulse 52 06/28/2018 10:36 AM  Resp 13 06/28/2018 10:36 AM  SpO2 100 % 06/28/2018 10:36 AM  Vitals shown include unvalidated device data.  Last Pain:  Vitals:   06/28/18 1034  TempSrc:   PainSc: 0-No pain      Patients Stated Pain Goal: 7 (70/01/74 9449)  Complications: No apparent anesthesia complications

## 2018-06-28 NOTE — Anesthesia Postprocedure Evaluation (Signed)
Anesthesia Post Note  Patient: [name]  Procedure(s) Performed: COLONOSCOPY WITH PROPOFOL (N/A ) POLYPECTOMY  Patient location during evaluation: PACU Anesthesia Type: MAC Level of consciousness: awake and alert and oriented Pain management: pain level controlled Vital Signs Assessment: post-procedure vital signs reviewed and stable Respiratory status: spontaneous breathing Cardiovascular status: stable Postop Assessment: no apparent nausea or vomiting Anesthetic complications: no     Last Vitals:  Vitals:   06/28/18 0854 06/28/18 1034  BP: 110/76 98/65  Pulse: (!) 53 (!) 56  Resp: (!) 22 14  Temp: 36.8 C 36.8 C  SpO2: 92% 100%    Last Pain:  Vitals:   06/28/18 1034  TempSrc:   PainSc: 0-No pain                 ADAMS, AMY A

## 2018-06-28 NOTE — Op Note (Signed)
[facility] Patient Name: [name] Procedure Date: 06/28/2018 9:58 AM MRN: [id number] Date of Birth: [birth year] Attending MD: Norvel Richards , MD CSN: [id number] Age: 52 Admit Type: Outpatient Procedure:                Colonoscopy Indications:              High risk colon cancer surveillance: Personal                            history of colonic polyps Providers:                Norvel Richards, MD, Lurline Del, RN, Gerome Sam, RN, Nelma Rothman, Technician Referring MD:              Medicines:                Propofol per Anesthesia Complications:            No immediate complications. Estimated Blood Loss:     Estimated blood loss: none. Procedure:                Pre-Anesthesia Assessment:                           - Prior to the procedure, a History and Physical                            was performed, and patient medications and                            allergies were reviewed. The patient's tolerance of                            previous anesthesia was also reviewed. The risks                            and benefits of the procedure and the sedation                            options and risks were discussed with the patient.                            All questions were answered, and informed consent                            was obtained. Prior Anticoagulants: The patient has                            taken no previous anticoagulant or antiplatelet                            agents. After reviewing the risks and benefits, the  patient was deemed in satisfactory condition to                            undergo the procedure.                           After obtaining informed consent, the colonoscope                            was passed under direct vision. Throughout the                            procedure, the patient's blood pressure, pulse, and                            oxygen saturations were  monitored continuously. The                            [id number]-[id number] ([id number]) scope was introduced through                            the and advanced to the the cecum, identified by                            appendiceal orifice and ileocecal valve. The                            colonoscopy was performed without difficulty. The                            patient tolerated the procedure well. The quality                            of the bowel preparation was adequate. Scope In: 10:04:21 AM Scope Out: 10:23:52 AM Scope Withdrawal Time: 0 hours 16 minutes 6 seconds  Total Procedure Duration: 0 hours 19 minutes 31 seconds  Findings:      The perianal and digital rectal examinations were normal.      Scattered medium-mouthed diverticula were found in the sigmoid colon and       descending colon.      Two sessile polyps were found in the ascending colon. The polyps were 5       to 6 mm in size. These polyps were removed with a cold snare. Resection       and retrieval were complete. Estimated blood loss was minimal.      The exam was otherwise without abnormality on direct and retroflexion       views. Impression:               - Diverticulosis in the sigmoid colon and in the                            descending colon.                           - Two 5 to 6 mm polyps in  the ascending colon,                            removed with a cold snare. Resected and retrieved.                           - The examination was otherwise normal on direct                            and retroflexion views. Moderate Sedation:      Moderate (conscious) sedation was personally administered by an       anesthesia professional. The following parameters were monitored: oxygen       saturation, heart rate, blood pressure, respiratory rate, EKG, adequacy       of pulmonary ventilation, and response to care. Recommendation:           - Patient has a contact number available for                             emergencies. The signs and symptoms of potential                            delayed complications were discussed with the                            patient. Return to normal activities tomorrow.                            Written discharge instructions were provided to the                            patient.                           - Advance diet as tolerated.                           - Continue present medications.                           - Repeat colonoscopy date to be determined after                            pending pathology results are reviewed for                            surveillance based on pathology results.                           - Return to GI office after studies are complete. Procedure Code(s):        --- Professional ---                           [phone], Colonoscopy, flexible; with removal of  tumor(s), polyp(s), or other lesion(s) by snare                            technique Diagnosis Code(s):        --- Professional ---                           Z86.010, Personal history of colonic polyps                           D12.2, Benign neoplasm of ascending colon                           K57.30, Diverticulosis of large intestine without                            perforation or abscess without bleeding CPT copyright 2018 [employer]. All rights reserved. The codes documented in this report are preliminary and upon coder review may  be revised to meet current compliance requirements. Cristopher Estimable. Vlada Uriostegui, MD Norvel Richards, MD 06/28/2018 10:30:32 AM This report has been signed electronically. Number of Addenda: 0

## 2018-06-28 NOTE — H&P (Signed)
@LOGO @   Primary Care Physician:  Sharilyn Sites, MD Primary Gastroenterologist:  Dr. Gala Romney  Pre-Procedure History & Physical: HPI:  [name] is a 52 y.o. male here for surveillance colonoscopy.  He has a history of multiple colonic adenomas removed 2016.  Past Medical History:  Diagnosis Date  . Anxiety   . Borderline diabetes   . Bulging disc   . Chronic back pain   . Diabetes mellitus without complication ([place])   . Hypercholesteremia   . Myocardial infarction ([place])   . Pancreatitis     Past Surgical History:  Procedure Laterality Date  . ABDOMINAL SURGERY    . APPENDECTOMY    . BACK SURGERY    . CARDIAC CATHETERIZATION  2003   "trivial coronary artery disease," normal EF  . COLONOSCOPY N/A 04/16/2015   Procedure: COLONOSCOPY;  Surgeon: Daneil Dolin, MD;  [facility];  Service: Endoscopy;  Laterality: N/A;  12:45 PM  . LEFT HEART CATH AND CORONARY ANGIOGRAPHY N/A 05/09/2017   Procedure: LEFT HEART CATH AND CORONARY ANGIOGRAPHY;  Surgeon: Leonie Man, MD;  Location: [place] [facility];  Service: Cardiovascular;  Laterality: N/A;  . LEFT HEART CATHETERIZATION WITH CORONARY ANGIOGRAM N/A 03/13/2014   Procedure: LEFT HEART CATHETERIZATION WITH CORONARY ANGIOGRAM;  Surgeon: Leonie Man, MD;  Location: [facility] CATH LAB;  Service: Cardiovascular;  Laterality: N/A;    Prior to Admission medications   Medication Sig Start Date End Date Taking? Authorizing Provider  ALPRAZolam Duanne Moron) 1 MG tablet Take 1 tablet by mouth 3 (three) times daily.  05/02/17  Yes [provider]  ibuprofen (ADVIL,MOTRIN) 200 MG tablet Take 800 mg by mouth every 6 (six) hours as needed for fever, headache or mild pain.    Yes [provider]  aspirin 81 MG chewable tablet Chew 1 tablet (81 mg total) by mouth daily. 03/15/14   Eileen Stanford, PA-C  carvedilol (COREG) 3.125 MG tablet TAKE 1 TABLET(3.125 MG) BY MOUTH TWICE DAILY WITH A MEAL Patient taking differently:  Take 3.125 mg by mouth 2 (two) times daily with a meal.  07/31/17   Branch, Alphonse Guild, MD  furosemide (LASIX) 20 MG tablet TAKE 1 TABLET BY MOUTH AS NEEDED FOR SWELLING Patient taking differently: Take 20 mg by mouth daily. TAKE 1 TABLET BY MOUTH AS NEEDED FOR SWELLING 02/08/18   Arnoldo Lenis, MD  isosorbide mononitrate (IMDUR) 30 MG 24 hr tablet Take 1.5 tablets (45 mg total) by mouth daily. 11/23/17   Arnoldo Lenis, MD  lisinopril (PRINIVIL,ZESTRIL) 2.5 MG tablet TAKE 1 TABLET(2.5 MG) BY MOUTH DAILY Patient taking differently: Take 2.5 mg by mouth daily.  02/07/18   Arnoldo Lenis, MD  metFORMIN (GLUCOPHAGE) 500 MG tablet Take 500 mg by mouth 2 (two) times daily.  01/31/18   [provider]  nitroGLYCERIN (NITROSTAT) 0.4 MG SL tablet Place 1 tablet (0.4 mg total) under the tongue every 5 (five) minutes as needed for chest pain. 03/02/17   Arnoldo Lenis, MD  oxyCODONE-acetaminophen (PERCOCET) 5-325 MG tablet Take 1 tablet by mouth every 8 (eight) hours as needed for severe pain. 03/07/18   Mesner, Corene Cornea, MD  pravastatin (PRAVACHOL) 20 MG tablet Take 1 tablet (20 mg total) by mouth every evening. 12/06/17 05/18/18  Arnoldo Lenis, MD    Allergies as of 05/31/2018  . (No Known Allergies)    Family History  Problem Relation Age of Onset  . Diabetes Father   . Hyperlipidemia Father   .  Hypertension Father   . Hypertension Mother   . Hyperlipidemia Mother   . Hyperlipidemia Sister   . Hypertension Sister   . Diabetes Sister     Social History   Socioeconomic History  . Marital status: Single    Spouse name: Not on file  . Number of children: Not on file  . Years of education: Not on file  . Highest education level: Not on file  Occupational History  . Not on file  Social Needs  . Financial resource strain: Not on file  . Food insecurity:    Worry: Not on file    Inability: Not on file  . Transportation needs:    Medical: Not on file    Non-medical: Not  on file  Tobacco Use  . Smoking status: Current Every Day Smoker    Packs/day: 0.50    Years: 30.00    Pack years: 15.00    Types: Cigarettes    Start date: 10/25/1977  . Smokeless tobacco: Never Used  Substance and Sexual Activity  . Alcohol use: Yes    Alcohol/week: 6.0 standard drinks    Types: 6 Cans of beer per week  . Drug use: No  . Sexual activity: Yes  Lifestyle  . Physical activity:    Days per week: Not on file    Minutes per session: Not on file  . Stress: Not on file  Relationships  . Social connections:    Talks on phone: Not on file    Gets together: Not on file    Attends religious service: Not on file    Active member of club or organization: Not on file    Attends meetings of clubs or organizations: Not on file    Relationship status: Not on file  . Intimate partner violence:    Fear of current or ex partner: Not on file    Emotionally abused: Not on file    Physically abused: Not on file    Forced sexual activity: Not on file  Other Topics Concern  . Not on file  Social History Narrative  . Not on file    Review of Systems: See HPI, otherwise negative ROS  Physical Exam: BP 110/76   Pulse (!) 53   Temp 98.2 F (36.8 C) (Oral)   Resp (!) 22   Ht 6\' 1"  (1.854 m)   Wt 98.4 kg   SpO2 92%   BMI 28.63 kg/m  General:   Alert,  Well-developed, well-nourished, pleasant and cooperative in NAD Lungs:  Clear throughout to auscultation.   No wheezes, crackles, or rhonchi. No acute distress. Heart:  Regular rate and rhythm; no murmurs, clicks, rubs,  or gallops. Abdomen: Non-distended, normal bowel sounds.  Soft and nontender without appreciable mass or hepatosplenomegaly.  Pulses:  Normal pulses noted. Extremities:  Without clubbing or edema.  Impression/Plan: 52 year old gentleman here with for surveillance colonoscopy.  History of multiple colonic adenomas removed previously.  The risks, benefits, limitations, alternatives and imponderables have been  reviewed with the patient. Questions have been answered. All parties are agreeable.      Notice: This dictation was prepared with Dragon dictation along with smaller phrase technology. Any transcriptional errors that result from this process are unintentional and may not be corrected upon review.

## 2018-06-30 ENCOUNTER — Encounter: Payer: Self-pay | Admitting: Internal Medicine

## 2018-07-03 ENCOUNTER — Encounter: Payer: Self-pay | Admitting: *Deleted

## 2018-07-03 ENCOUNTER — Encounter: Payer: Self-pay | Admitting: Cardiology

## 2018-07-03 ENCOUNTER — Ambulatory Visit (INDEPENDENT_AMBULATORY_CARE_PROVIDER_SITE_OTHER): Payer: Medicaid Other | Admitting: Cardiology

## 2018-07-03 VITALS — BP 111/65 | HR 54 | Ht 73.0 in | Wt 220.0 lb

## 2018-07-03 DIAGNOSIS — I2510 Atherosclerotic heart disease of native coronary artery without angina pectoris: Secondary | ICD-10-CM | POA: Diagnosis not present

## 2018-07-03 MED ORDER — ISOSORBIDE MONONITRATE ER 60 MG PO TB24: 60.0000 mg | ORAL_TABLET | Freq: Every day | ORAL | 6 refills | Status: DC

## 2018-07-03 NOTE — Progress Notes (Signed)
Clinical Summary [name] is a 52 y.o.male seen today for follow up of the following medical problems.   1. CAD - admit 03/2014 with inferior STEMI, received BMS to RCA. LVEF 45-50% by LV gram. 02/2017 nuclear stress: mild apical ischemia vs artifact. Overall low risk -02/2017 nuclear stress: inferior ischemia 04/2017 cath: patent coronaries, patent RCA stent. Elevated LVEDP 26   - some recent chest pain. Dull pain. Cramp like feeling. Can occur at any time, not specifically exertional - compliant with meds   2. Hyperlipidemia - tolerates pravastatin, reports prior myaglias on a prior statins  - compliant with statin    Past Medical History:  Diagnosis Date  . Anxiety   . Borderline diabetes   . Bulging disc   . Chronic back pain   . Diabetes mellitus without complication ([place])   . Hypercholesteremia   . Myocardial infarction ([place])   . Pancreatitis      No Known Allergies   Current Outpatient Medications  Medication Sig Dispense Refill  . ALPRAZolam (XANAX) 1 MG tablet Take 1 tablet by mouth 3 (three) times daily.   2  . aspirin 81 MG chewable tablet Chew 1 tablet (81 mg total) by mouth daily.    . carvedilol (COREG) 3.125 MG tablet TAKE 1 TABLET(3.125 MG) BY MOUTH TWICE DAILY WITH A MEAL (Patient taking differently: Take 3.125 mg by mouth 2 (two) times daily with a meal. ) 60 tablet 6  . furosemide (LASIX) 20 MG tablet TAKE 1 TABLET BY MOUTH AS NEEDED FOR SWELLING (Patient taking differently: Take 20 mg by mouth daily. TAKE 1 TABLET BY MOUTH AS NEEDED FOR SWELLING) 90 tablet 0  . ibuprofen (ADVIL,MOTRIN) 200 MG tablet Take 800 mg by mouth every 6 (six) hours as needed for fever, headache or mild pain.     . isosorbide mononitrate (IMDUR) 30 MG 24 hr tablet Take 1.5 tablets (45 mg total) by mouth daily. 135 tablet 1  . lisinopril (PRINIVIL,ZESTRIL) 2.5 MG tablet TAKE 1 TABLET(2.5 MG) BY MOUTH DAILY (Patient taking differently: Take 2.5 mg by mouth daily. ) 90  tablet 2  . metFORMIN (GLUCOPHAGE) 500 MG tablet Take 500 mg by mouth 2 (two) times daily.   5  . nitroGLYCERIN (NITROSTAT) 0.4 MG SL tablet Place 1 tablet (0.4 mg total) under the tongue every 5 (five) minutes as needed for chest pain. 25 tablet 3  . oxyCODONE-acetaminophen (PERCOCET) 5-325 MG tablet Take 1 tablet by mouth every 8 (eight) hours as needed for severe pain. 10 tablet 0  . pravastatin (PRAVACHOL) 20 MG tablet Take 1 tablet (20 mg total) by mouth every evening. 90 tablet 1   No current facility-administered medications for this visit.      Past Surgical History:  Procedure Laterality Date  . ABDOMINAL SURGERY    . APPENDECTOMY    . BACK SURGERY    . CARDIAC CATHETERIZATION  2003   "trivial coronary artery disease," normal EF  . COLONOSCOPY N/A 04/16/2015   Procedure: COLONOSCOPY;  Surgeon: Daneil Dolin, MD;  [facility];  Service: Endoscopy;  Laterality: N/A;  12:45 PM  . LEFT HEART CATH AND CORONARY ANGIOGRAPHY N/A 05/09/2017   Procedure: LEFT HEART CATH AND CORONARY ANGIOGRAPHY;  Surgeon: Leonie Man, MD;  Location: [facility];  Service: Cardiovascular;  Laterality: N/A;  . LEFT HEART CATHETERIZATION WITH CORONARY ANGIOGRAM N/A 03/13/2014   Procedure: LEFT HEART CATHETERIZATION WITH CORONARY ANGIOGRAM;  Surgeon: Leonie Man, MD;  Location: [place] CATH LAB;  Service: Cardiovascular;  Laterality: N/A;     No Known Allergies    Family History  Problem Relation Age of Onset  . Diabetes Father   . Hyperlipidemia Father   . Hypertension Father   . Hypertension Mother   . Hyperlipidemia Mother   . Hyperlipidemia Sister   . Hypertension Sister   . Diabetes Sister      Social History [name] reports that he has been smoking cigarettes. He started smoking about 40 years ago. He has a 15.00 pack-year smoking history. He has never used smokeless tobacco. [name] reports current alcohol use of about 6.0 standard drinks of alcohol per  week.   Review of Systems CONSTITUTIONAL: No weight loss, fever, chills, weakness or fatigue.  HEENT: Eyes: No visual loss, blurred vision, double vision or yellow sclerae.No hearing loss, sneezing, congestion, runny nose or sore throat.  SKIN: No rash or itching.  CARDIOVASCULAR: per hpi RESPIRATORY: No shortness of breath, cough or sputum.  GASTROINTESTINAL: No anorexia, nausea, vomiting or diarrhea. No abdominal pain or blood.  GENITOURINARY: No burning on urination, no polyuria NEUROLOGICAL: No headache, dizziness, syncope, paralysis, ataxia, numbness or tingling in the extremities. No change in bowel or bladder control.  MUSCULOSKELETAL: No muscle, back pain, joint pain or stiffness.  LYMPHATICS: No enlarged nodes. No history of splenectomy.  PSYCHIATRIC: No history of depression or anxiety.  ENDOCRINOLOGIC: No reports of sweating, cold or heat intolerance. No polyuria or polydipsia.  Marland Kitchen   Physical Examination Vitals:   07/03/18 1012  BP: 111/65  Pulse: (!) 54  SpO2: 98%   Vitals:   07/03/18 1012  Weight: 220 lb (99.8 kg)  Height: 6\' 1"  (1.854 m)    Gen: resting comfortably, no acute distress HEENT: no scleral icterus, pupils equal round and reactive, no palptable cervical adenopathy,  CV Resp: Clear to auscultation bilaterally GI: abdomen is soft, non-tender, non-distended, normal bowel sounds, no hepatosplenomegaly MSK: extremities are warm, no edema.  Skin: warm, no rash Neuro:  no focal deficits Psych: appropriate affect   Diagnostic Studies 02/2017 nuclear stress  No diagnostic ST segment changes to indicate ischemia.  Small, mild intensity, apical to basal inferior defect is partially reversible and suggestive of ischemia, although there is gut radiotracer uptake artifact adjacent to the inferior wall on rest imaging which could also be contributing to the defect reversibility.  This is a low risk study.  Nuclear stress EF: 63%.   04/2017  cath   Angiographically normal coronary arteries  Dist RCA BMS (VeriFlex 5.0 x 15), 0 %stenosed.  The left ventricular systolic function is normal. The left ventricular ejection fraction is 50-55% by visual estimate.  LV end diastolic pressure is moderately elevated.  Patient essentially has intra-graphic normal coronary arteries with widely RCA. No culprit lesion to explain inferior ischemia. Elevated LVEDP could explain microvascular ischemia.  Otherwise consider non-anginal etiology for chest pain.   Plan: Discharge home today after bed rest with TR band removal.      Assessment and Plan  1. CAD -long history of chest pain symptoms, cath last year with patent vessels - current symptoms not exertional, not strong consistent with ischemia - will titrate imdur to 60mg  daily emperically. At this time no strong indication for repeat ischemic testing - EKG today SR, no acute ischemci changes   2. Hyperlipidemia - request labs from pcp, continue statin.   F/u 4 months  Arnoldo Lenis, M.D.

## 2018-07-03 NOTE — Patient Instructions (Signed)
Medication Instructions:   Increase Imdur to 60mg  daily.  Continue all other medications.    Labwork: none  Testing/Procedures: none  Follow-Up: 4 months   Any Other Special Instructions Will Be Listed Below (If Applicable).  If you need a refill on your cardiac medications before your next appointment, please call your pharmacy.

## 2018-07-06 ENCOUNTER — Encounter (HOSPITAL_COMMUNITY): Payer: Self-pay | Admitting: Internal Medicine

## 2018-07-10 ENCOUNTER — Telehealth: Payer: Self-pay

## 2018-07-10 DIAGNOSIS — E782 Mixed hyperlipidemia: Secondary | ICD-10-CM

## 2018-07-10 MED ORDER — PRAVASTATIN SODIUM 80 MG PO TABS: 80.0000 mg | ORAL_TABLET | Freq: Every evening | ORAL | 3 refills | Status: DC

## 2018-07-10 NOTE — Telephone Encounter (Signed)
-----   Message from Herminio Commons, MD sent at 07/10/2018  9:41 AM EST ----- Lipids markedly elevated.  Increase pravastatin to 80 mg and repeat a lipid panel in 2 months.  Is he complying with a low-fat/cardiac diet?

## 2018-07-10 NOTE — Telephone Encounter (Signed)
Pt will increase pravastatin to 80 mg daily at dinner.He eats mainly baked chicken   I mailed him his lab slip

## 2018-08-23 ENCOUNTER — Other Ambulatory Visit: Payer: Self-pay | Admitting: Cardiology

## 2018-09-29 ENCOUNTER — Emergency Department (HOSPITAL_COMMUNITY): Payer: Medicaid Other

## 2018-09-29 ENCOUNTER — Inpatient Hospital Stay (HOSPITAL_COMMUNITY)
Admission: EM | Admit: 2018-09-29 | Discharge: 2018-10-03 | DRG: 493 | Disposition: A | Discharge status: Home Health | Payer: Medicaid Other | Attending: Internal Medicine | Admitting: Internal Medicine

## 2018-09-29 ENCOUNTER — Other Ambulatory Visit: Payer: Self-pay

## 2018-09-29 ENCOUNTER — Encounter (HOSPITAL_COMMUNITY): Payer: Self-pay

## 2018-09-29 ENCOUNTER — Inpatient Hospital Stay (HOSPITAL_COMMUNITY): Payer: Medicaid Other

## 2018-09-29 DIAGNOSIS — D62 Acute posthemorrhagic anemia: Secondary | ICD-10-CM | POA: Diagnosis not present

## 2018-09-29 DIAGNOSIS — S82892A Other fracture of left lower leg, initial encounter for closed fracture: Secondary | ICD-10-CM

## 2018-09-29 DIAGNOSIS — T148XXA Other injury of unspecified body region, initial encounter: Secondary | ICD-10-CM | POA: Diagnosis not present

## 2018-09-29 DIAGNOSIS — E559 Vitamin D deficiency, unspecified: Secondary | ICD-10-CM | POA: Diagnosis present

## 2018-09-29 DIAGNOSIS — G8929 Other chronic pain: Secondary | ICD-10-CM | POA: Diagnosis present

## 2018-09-29 DIAGNOSIS — Z79891 Long term (current) use of opiate analgesic: Secondary | ICD-10-CM | POA: Diagnosis not present

## 2018-09-29 DIAGNOSIS — Z79899 Other long term (current) drug therapy: Secondary | ICD-10-CM | POA: Diagnosis not present

## 2018-09-29 DIAGNOSIS — Z833 Family history of diabetes mellitus: Secondary | ICD-10-CM | POA: Diagnosis not present

## 2018-09-29 DIAGNOSIS — Y92017 Garden or yard in single-family (private) house as the place of occurrence of the external cause: Secondary | ICD-10-CM | POA: Diagnosis not present

## 2018-09-29 DIAGNOSIS — S82832A Other fracture of upper and lower end of left fibula, initial encounter for closed fracture: Secondary | ICD-10-CM | POA: Diagnosis present

## 2018-09-29 DIAGNOSIS — F419 Anxiety disorder, unspecified: Secondary | ICD-10-CM | POA: Diagnosis present

## 2018-09-29 DIAGNOSIS — Z955 Presence of coronary angioplasty implant and graft: Secondary | ICD-10-CM | POA: Diagnosis not present

## 2018-09-29 DIAGNOSIS — Z01811 Encounter for preprocedural respiratory examination: Secondary | ICD-10-CM

## 2018-09-29 DIAGNOSIS — E1165 Type 2 diabetes mellitus with hyperglycemia: Secondary | ICD-10-CM

## 2018-09-29 DIAGNOSIS — Z419 Encounter for procedure for purposes other than remedying health state, unspecified: Secondary | ICD-10-CM

## 2018-09-29 DIAGNOSIS — S82202A Unspecified fracture of shaft of left tibia, initial encounter for closed fracture: Secondary | ICD-10-CM | POA: Diagnosis present

## 2018-09-29 DIAGNOSIS — Z8249 Family history of ischemic heart disease and other diseases of the circulatory system: Secondary | ICD-10-CM | POA: Diagnosis not present

## 2018-09-29 DIAGNOSIS — F17210 Nicotine dependence, cigarettes, uncomplicated: Secondary | ICD-10-CM | POA: Diagnosis present

## 2018-09-29 DIAGNOSIS — S82302A Unspecified fracture of lower end of left tibia, initial encounter for closed fracture: Secondary | ICD-10-CM

## 2018-09-29 DIAGNOSIS — E7800 Pure hypercholesterolemia, unspecified: Secondary | ICD-10-CM | POA: Diagnosis present

## 2018-09-29 DIAGNOSIS — Z7984 Long term (current) use of oral hypoglycemic drugs: Secondary | ICD-10-CM

## 2018-09-29 DIAGNOSIS — W1831XA Fall on same level due to stepping on an object, initial encounter: Secondary | ICD-10-CM | POA: Diagnosis present

## 2018-09-29 DIAGNOSIS — E119 Type 2 diabetes mellitus without complications: Secondary | ICD-10-CM | POA: Diagnosis present

## 2018-09-29 DIAGNOSIS — I252 Old myocardial infarction: Secondary | ICD-10-CM

## 2018-09-29 DIAGNOSIS — E8889 Other specified metabolic disorders: Secondary | ICD-10-CM | POA: Diagnosis present

## 2018-09-29 DIAGNOSIS — Z8349 Family history of other endocrine, nutritional and metabolic diseases: Secondary | ICD-10-CM

## 2018-09-29 DIAGNOSIS — S82872A Displaced pilon fracture of left tibia, initial encounter for closed fracture: Secondary | ICD-10-CM | POA: Diagnosis present

## 2018-09-29 DIAGNOSIS — M549 Dorsalgia, unspecified: Secondary | ICD-10-CM | POA: Diagnosis present

## 2018-09-29 DIAGNOSIS — S82392A Other fracture of lower end of left tibia, initial encounter for closed fracture: Secondary | ICD-10-CM | POA: Diagnosis present

## 2018-09-29 DIAGNOSIS — Z7982 Long term (current) use of aspirin: Secondary | ICD-10-CM

## 2018-09-29 DIAGNOSIS — F17200 Nicotine dependence, unspecified, uncomplicated: Secondary | ICD-10-CM | POA: Diagnosis present

## 2018-09-29 DIAGNOSIS — E1159 Type 2 diabetes mellitus with other circulatory complications: Secondary | ICD-10-CM

## 2018-09-29 DIAGNOSIS — Z8719 Personal history of other diseases of the digestive system: Secondary | ICD-10-CM | POA: Diagnosis not present

## 2018-09-29 DIAGNOSIS — I2510 Atherosclerotic heart disease of native coronary artery without angina pectoris: Secondary | ICD-10-CM | POA: Diagnosis present

## 2018-09-29 HISTORY — DX: Other fracture of lower end of left tibia, initial encounter for closed fracture: S82.392A

## 2018-09-29 LAB — CBC WITH DIFFERENTIAL/PLATELET
Abs Immature Granulocytes: 0.1 10*3/uL — ABNORMAL HIGH (ref 0.00–0.07)
Basophils Absolute: 0.1 10*3/uL (ref 0.0–0.1)
Basophils Relative: 0 %
Eosinophils Absolute: 0.1 10*3/uL (ref 0.0–0.5)
Eosinophils Relative: 1 %
HCT: 44.7 % (ref 39.0–52.0)
Hemoglobin: 15 g/dL (ref 13.0–17.0)
Immature Granulocytes: 1 %
Lymphocytes Relative: 26 %
Lymphs Abs: 3 10*3/uL (ref 0.7–4.0)
MCH: 32 pg (ref 26.0–34.0)
MCHC: 33.6 g/dL (ref 30.0–36.0)
MCV: 95.3 fL (ref 80.0–100.0)
Monocytes Absolute: 0.8 10*3/uL (ref 0.1–1.0)
Monocytes Relative: 7 %
Neutro Abs: 7.3 10*3/uL (ref 1.7–7.7)
Neutrophils Relative %: 65 %
Platelets: 142 10*3/uL — ABNORMAL LOW (ref 150–400)
RBC: 4.69 MIL/uL (ref 4.22–5.81)
RDW: 13.5 % (ref 11.5–15.5)
WBC: 11.4 10*3/uL — ABNORMAL HIGH (ref 4.0–10.5)
nRBC: 0 % (ref 0.0–0.2)

## 2018-09-29 LAB — BASIC METABOLIC PANEL
Anion gap: 11 (ref 5–15)
BUN: 22 mg/dL — ABNORMAL HIGH (ref 6–20)
CO2: 24 mmol/L (ref 22–32)
Calcium: 9.3 mg/dL (ref 8.9–10.3)
Chloride: 102 mmol/L (ref 98–111)
Creatinine, Ser: 0.84 mg/dL (ref 0.61–1.24)
GFR calc Af Amer: >60 mL/min (ref >60)
GFR calc non Af Amer: >60 mL/min (ref >60)
Glucose, Bld: 165 mg/dL — ABNORMAL HIGH (ref 70–99)
Potassium: 3.6 mmol/L (ref 3.5–5.1)
Sodium: 137 mmol/L (ref 135–145)

## 2018-09-29 LAB — GLUCOSE, CAPILLARY: Glucose-Capillary: 131 mg/dL — ABNORMAL HIGH (ref 70–99)

## 2018-09-29 LAB — CBG MONITORING, ED: Glucose-Capillary: 155 mg/dL — ABNORMAL HIGH (ref 70–99)

## 2018-09-29 MED ORDER — KETOROLAC TROMETHAMINE 15 MG/ML IJ SOLN
INTRAMUSCULAR | Status: AC
  Administered 2018-09-29: 15 mg
  Filled 2018-09-29: qty 1

## 2018-09-29 MED ORDER — MORPHINE SULFATE (PF) 4 MG/ML IV SOLN
8.0000 mg | Freq: Once | INTRAVENOUS | Status: AC
  Administered 2018-09-29: 8 mg via INTRAVENOUS
  Filled 2018-09-29: qty 2

## 2018-09-29 MED ORDER — LORAZEPAM 2 MG/ML IJ SOLN
1.0000 mg | Freq: Once | INTRAMUSCULAR | Status: AC
  Administered 2018-09-29: 1 mg via INTRAVENOUS
  Filled 2018-09-29: qty 1

## 2018-09-29 MED ORDER — ONDANSETRON HCL 4 MG PO TABS: 4.0000 mg | ORAL_TABLET | Freq: Four times a day (QID) | ORAL | Status: DC | PRN

## 2018-09-29 MED ORDER — POLYETHYLENE GLYCOL 3350 17 G PO PACK: 17.0000 g | PACK | Freq: Every day | ORAL | Status: DC | PRN

## 2018-09-29 MED ORDER — INSULIN ASPART 100 UNIT/ML ~~LOC~~ SOLN
0.0000 [IU] | SUBCUTANEOUS | Status: DC
  Administered 2018-09-29: 2 [IU] via SUBCUTANEOUS
  Administered 2018-09-30 (×2): 1 [IU] via SUBCUTANEOUS
  Administered 2018-09-30 – 2018-10-01 (×2): 2 [IU] via SUBCUTANEOUS
  Administered 2018-10-01 (×2): 3 [IU] via SUBCUTANEOUS
  Administered 2018-10-02: 2 [IU] via SUBCUTANEOUS
  Administered 2018-10-02: 1 [IU] via SUBCUTANEOUS
  Administered 2018-10-02 (×2): 2 [IU] via SUBCUTANEOUS
  Filled 2018-09-29: qty 1

## 2018-09-29 MED ORDER — SODIUM CHLORIDE 0.9 % IV SOLN
INTRAVENOUS | Status: DC
  Administered 2018-09-29: 16:00:00 via INTRAVENOUS

## 2018-09-29 MED ORDER — ACETAMINOPHEN 325 MG PO TABS: 650.0000 mg | ORAL_TABLET | Freq: Four times a day (QID) | ORAL | Status: DC | PRN

## 2018-09-29 MED ORDER — MORPHINE SULFATE (PF) 2 MG/ML IV SOLN
2.0000 mg | INTRAVENOUS | Status: DC | PRN
  Administered 2018-09-29 – 2018-10-01 (×7): 2 mg via INTRAVENOUS
  Filled 2018-09-29 (×8): qty 1

## 2018-09-29 MED ORDER — ONDANSETRON HCL 4 MG/2ML IJ SOLN
4.0000 mg | Freq: Four times a day (QID) | INTRAMUSCULAR | Status: DC | PRN
  Administered 2018-10-01: 4 mg via INTRAVENOUS

## 2018-09-29 MED ORDER — LORAZEPAM 2 MG/ML IJ SOLN: 1.0000 mg | Freq: Once | INTRAMUSCULAR | Status: DC

## 2018-09-29 MED ORDER — HYDROMORPHONE HCL 1 MG/ML IJ SOLN
1.0000 mg | Freq: Once | INTRAMUSCULAR | Status: AC
  Administered 2018-09-29: 1 mg via INTRAVENOUS
  Filled 2018-09-29: qty 1

## 2018-09-29 MED ORDER — KETOROLAC TROMETHAMINE 30 MG/ML IJ SOLN: 15.0000 mg | Freq: Once | INTRAMUSCULAR | Status: DC

## 2018-09-29 MED ORDER — GABAPENTIN 300 MG PO CAPS
300.0000 mg | ORAL_CAPSULE | Freq: Once | ORAL | Status: AC
  Administered 2018-09-29: 300 mg via ORAL
  Filled 2018-09-29: qty 1

## 2018-09-29 MED ORDER — ACETAMINOPHEN 650 MG RE SUPP: 650.0000 mg | Freq: Four times a day (QID) | RECTAL | Status: DC | PRN

## 2018-09-29 NOTE — ED Notes (Signed)
ED TO INPATIENT HANDOFF REPORT  ED Nurse Name and Phone #: Kiarra Kidd,rn  S Name/Age/Gender [name] 53 y.o. male Room/Bed: APA19/APA19  Code Status   Code Status: Prior  Home/SNF/Other Home Patient oriented to: self Is this baseline? Yes   Triage Complete: Triage complete  Chief Complaint Ankle Fracture  Triage Note Pt was cutting wood today when he stepped on a log with his left leg. Obvious Tib Fib deformity. Splint applied by EMS. 29 G R ac. 100 of Fentanyl given by EM. Pt rates pain 6/10   Allergies No Known Allergies  Level of Care/Admitting Diagnosis ED Disposition    ED Disposition Condition [place] [facility]: [facility] [[id number]]  Level of Care: Telemetry [5]  Diagnosis: Closed left ankle fracture [833825]  Admitting Physician: Bethena Roys [[id number]]  Attending Physician: Bethena Roys [phone]  Estimated length of stay: past midnight tomorrow  Certification:: I certify this patient will need inpatient services for at least 2 midnights  PT Class (Do Not Modify): Inpatient [101]  PT Acc Code (Do Not Modify): Private [1]       B Medical/Surgery History Past Medical History:  Diagnosis Date  . Anxiety   . Borderline diabetes   . Bulging disc   . Chronic back pain   . Diabetes mellitus without complication ([place])   . Hypercholesteremia   . Myocardial infarction ([place])   . Pancreatitis    Past Surgical History:  Procedure Laterality Date  . ABDOMINAL SURGERY    . APPENDECTOMY    . BACK SURGERY    . CARDIAC CATHETERIZATION  2003   "trivial coronary artery disease," normal EF  . COLONOSCOPY N/A 04/16/2015   Procedure: COLONOSCOPY;  Surgeon: Daneil Dolin, MD;  [facility];  Service: Endoscopy;  Laterality: N/A;  12:45 PM  . COLONOSCOPY WITH PROPOFOL N/A 06/28/2018   Procedure: COLONOSCOPY WITH PROPOFOL;  Surgeon: Daneil Dolin, MD;  [facility];  Service: Endoscopy;  Laterality: N/A;   9:45am  . LEFT HEART CATH AND CORONARY ANGIOGRAPHY N/A 05/09/2017   Procedure: LEFT HEART CATH AND CORONARY ANGIOGRAPHY;  Surgeon: Leonie Man, MD;  Location: [place] [facility] LAB;  Service: Cardiovascular;  Laterality: N/A;  . LEFT HEART CATHETERIZATION WITH CORONARY ANGIOGRAM N/A 03/13/2014   Procedure: LEFT HEART CATHETERIZATION WITH CORONARY ANGIOGRAM;  Surgeon: Leonie Man, MD;  Location: [facility] CATH LAB;  Service: Cardiovascular;  Laterality: N/A;  . POLYPECTOMY  06/28/2018   Procedure: POLYPECTOMY;  Surgeon: Daneil Dolin, MD;  [facility];  Service: Endoscopy;;  (colon)     A IV Location/Drains/Wounds Patient Lines/Drains/Airways Status   Active Line/Drains/Airways    Name:   Placement date:   Placement time:   Site:   Days:   Peripheral IV 09/29/18 Right Antecubital   09/29/18    1500    Antecubital   less than 1          Intake/Output Last 24 hours No intake or output data in the 24 hours ending 09/29/18 1816  Labs/Imaging Results for orders placed or performed during the hospital encounter of 09/29/18 (from the past 48 hour(s))  CBC with Differential     Status: Abnormal   Collection Time: 09/29/18  4:22 PM  Result Value Ref Range   WBC 11.4 (H) 4.0 - 10.5 K/uL   RBC 4.69 4.22 - 5.81 MIL/uL   Hemoglobin 15.0 13.0 - 17.0 g/dL   HCT 44.7 39.0 -  52.0 %   MCV 95.3 80.0 - 100.0 fL   MCH 32.0 26.0 - 34.0 pg   MCHC 33.6 30.0 - 36.0 g/dL   RDW 13.5 11.5 - 15.5 %   Platelets 142 (L) 150 - 400 K/uL   nRBC 0.0 0.0 - 0.2 %   Neutrophils Relative % 65 %   Neutro Abs 7.3 1.7 - 7.7 K/uL   Lymphocytes Relative 26 %   Lymphs Abs 3.0 0.7 - 4.0 K/uL   Monocytes Relative 7 %   Monocytes Absolute 0.8 0.1 - 1.0 K/uL   Eosinophils Relative 1 %   Eosinophils Absolute 0.1 0.0 - 0.5 K/uL   Basophils Relative 0 %   Basophils Absolute 0.1 0.0 - 0.1 K/uL   Immature Granulocytes 1 %   Abs Immature Granulocytes 0.10 (H) 0.00 - 0.07 K/uL    Comment: Performed at [facility], [address], [place], [place] [address]  Basic metabolic panel     Status: Abnormal   Collection Time: 09/29/18  4:22 PM  Result Value Ref Range   Sodium 137 135 - 145 mmol/L   Potassium 3.6 3.5 - 5.1 mmol/L   Chloride 102 98 - 111 mmol/L   CO2 24 22 - 32 mmol/L   Glucose, Bld 165 (H) 70 - 99 mg/dL   BUN 22 (H) 6 - 20 mg/dL   Creatinine, Ser 0.84 0.61 - 1.24 mg/dL   Calcium 9.3 8.9 - 10.3 mg/dL   GFR calc non Af Amer >60 >60 mL/min   GFR calc Af Amer >60 >60 mL/min   Anion gap 11 5 - 15    Comment: Performed at [facility], [address], [place], [place] [address]   Dg Tibia/fibula Left  Result Date: 09/29/2018 CLINICAL DATA:  Post reduction. EXAM: LEFT TIBIA AND FIBULA - 2 VIEW COMPARISON:  None. FINDINGS: Improved alignment at the distal tibia fracture site. No significant change seen at the distal fibular fracture site. Ankle mortise is symmetric. Proximal tibia and fibula appear intact and normally aligned. IMPRESSION: Improved alignment at the distal tibia fracture site status post reduction. Proximal tibia and fibula appear intact and normally aligned. Electronically Signed   By: Franki Cabot M.D.   On: 09/29/2018 17:48   Dg Tibia/fibula Left  Result Date: 09/29/2018 CLINICAL DATA:  Recent fall with lower leg deformity EXAM: LEFT TIBIA AND FIBULA - 2 VIEW COMPARISON:  None. FINDINGS: Comminuted distal tibial and fibular fractures are seen. There appears to be a rotational component to the fractures with the foot rotated laterally. The exam is somewhat limited due to the patient's positioning. IMPRESSION: Distal tibial and fibular fractures. Electronically Signed   By: [name] M.D.   On: 09/29/2018 16:31   Dg Ankle 2 Views Left  Result Date: 09/29/2018 CLINICAL DATA:  Post reduction, LEFT tib fib. EXAM: LEFT ANKLE - 2 VIEW COMPARISON:  Plain film of the LEFT ankle from earlier same day. FINDINGS: Interval casting. Alignment at the distal tibia fracture site appears  improved status post reduction and casting. Ankle mortise appears symmetric. Likely stable fracture displacement of the distal fibula. IMPRESSION: Improved alignment at the distal tibia fracture site status post reduction and casting. Electronically Signed   By: Franki Cabot M.D.   On: 09/29/2018 17:47   Dg Ankle Complete Left  Result Date: 09/29/2018 CLINICAL DATA:  Recent fall with ankle pain, initial encounter EXAM: LEFT ANKLE COMPLETE - 3+ VIEW COMPARISON:  None. FINDINGS: Comminuted distal tibial and fibular fractures  are seen. Some lateral rotation is suggested of the foot with respect to the more proximal tibial and fibular shafts. Mild soft tissue swelling is noted. IMPRESSION: Comminuted distal tibial and fibular fractures. Some rotation of the foot laterally is suggested. Electronically Signed   By: [name] M.D.   On: 09/29/2018 16:32    Pending Labs Unresulted Labs (From admission, onward)    Start     Ordered   Signed and Held  HIV antibody (Routine Testing)  Once,   R     Signed and Held          Vitals/Pain Today's Vitals   09/29/18 1619 09/29/18 1700 09/29/18 1710 09/29/18 1730  BP:  98/66  99/62  Pulse:  64    Resp:  12  17  Temp:      TempSrc:      SpO2:  94%    Weight:      Height:      PainSc: 4   4      Isolation Precautions No active isolations  Medications Medications  0.9 %  sodium chloride infusion ( Intravenous New Bag/Given 09/29/18 1544)  morphine 4 MG/ML injection 8 mg (8 mg Intravenous Given 09/29/18 1545)  LORazepam (ATIVAN) injection 1 mg (1 mg Intravenous Given 09/29/18 1637)  HYDROmorphone (DILAUDID) injection 1 mg (1 mg Intravenous Given 09/29/18 1640)  LORazepam (ATIVAN) injection 1 mg (1 mg Intravenous Given 09/29/18 1659)    Mobility  Normally ambulates independently  High fall risk   Focused Assessments    R Recommendations: See Admitting Provider Note  Report given to:   Additional Notes:

## 2018-09-29 NOTE — ED Notes (Signed)
Pt is in a lot of pain now. States leg is burning. Have inspected patient's leg and splint is WDL. Have notified MD as well.

## 2018-09-29 NOTE — ED Notes (Signed)
Admitting MD at bedside.

## 2018-09-29 NOTE — ED Notes (Signed)
Have notified NT to help Dr. Aline Brochure with splinting

## 2018-09-29 NOTE — H&P (Signed)
History and Physical    [name] OTL:[id number] DOB: [birth year] DOA: 09/29/2018  PCP: Sharilyn Sites, MD   Patient coming from: Home  I have personally briefly reviewed patient's old medical records in [place]  Chief Complaint: Ankle fracture  HPI: [name] is a 53 y.o. male with medical history significant for ST elevation MI and diabetes mellitus who presented to the ED with complaints of left ankle pain.  Patient was splitting wood when he stepped on a log of wood awkwardly pain and deformity to his left lower extremity.  EMS was called.  And splint was applied.  Patient denies chest pain or difficulty breathing.  Patient denies any flulike symptoms.  No recent travel no sick contacts.  ED Course: Blood pressure soft to stable 99-109, unremarkable CBC BMP.  X-ray of left lower extremity shows comminuted distal tibia-fibula fractures.  Dilaudid and morphine given in ED for pain control, and Ativan.  Orthopedist Dr. Aline Brochure was consulted in the ED, saw patient and did a close reduction in the ED, also talked to Dr. Marcelino Scot orthopedist on call at [facility] who agreed to consult on patient, and hospitalist to admit and transfer to [facility]  Review of Systems: As per HPI all other systems reviewed and negative.  Past Medical History:  Diagnosis Date  . Anxiety   . Borderline diabetes   . Bulging disc   . Chronic back pain   . Diabetes mellitus without complication ([place])   . Hypercholesteremia   . Myocardial infarction ([place])   . Pancreatitis     Past Surgical History:  Procedure Laterality Date  . ABDOMINAL SURGERY    . APPENDECTOMY    . BACK SURGERY    . CARDIAC CATHETERIZATION  2003   "trivial coronary artery disease," normal EF  . COLONOSCOPY N/A 04/16/2015   Procedure: COLONOSCOPY;  Surgeon: Daneil Dolin, MD;  [facility];  Service: Endoscopy;  Laterality: N/A;  12:45 PM  . COLONOSCOPY WITH PROPOFOL N/A 06/28/2018   Procedure: COLONOSCOPY  WITH PROPOFOL;  Surgeon: Daneil Dolin, MD;  [facility];  Service: Endoscopy;  Laterality: N/A;  9:45am  . LEFT HEART CATH AND CORONARY ANGIOGRAPHY N/A 05/09/2017   Procedure: LEFT HEART CATH AND CORONARY ANGIOGRAPHY;  Surgeon: Leonie Man, MD;  Location: [place] [facility] LAB;  Service: Cardiovascular;  Laterality: N/A;  . LEFT HEART CATHETERIZATION WITH CORONARY ANGIOGRAM N/A 03/13/2014   Procedure: LEFT HEART CATHETERIZATION WITH CORONARY ANGIOGRAM;  Surgeon: Leonie Man, MD;  Location: [facility] CATH LAB;  Service: Cardiovascular;  Laterality: N/A;  . POLYPECTOMY  06/28/2018   Procedure: POLYPECTOMY;  Surgeon: Daneil Dolin, MD;  [facility];  Service: Endoscopy;;  (colon)    reports that he has been smoking cigarettes. He started smoking about 40 years ago. He has a 15.00 pack-year smoking history. He has never used smokeless tobacco. He reports current alcohol use of about 6.0 standard drinks of alcohol per week. He reports that he does not use drugs.  No Known Allergies  Family History  Problem Relation Age of Onset  . Diabetes Father   . Hyperlipidemia Father   . Hypertension Father   . Hypertension Mother   . Hyperlipidemia Mother   . Hyperlipidemia Sister   . Hypertension Sister   . Diabetes Sister     Prior to Admission medications   Medication Sig Start Date End Date Taking? Authorizing Provider  ALPRAZolam Duanne Moron) 1 MG tablet  Take 1 tablet by mouth 3 (three) times daily.  05/02/17   [provider]  aspirin 81 MG chewable tablet Chew 1 tablet (81 mg total) by mouth daily. 03/15/14   Eileen Stanford, PA-C  carvedilol (COREG) 3.125 MG tablet TAKE 1 TABLET(3.125 MG) BY MOUTH TWICE DAILY WITH A MEAL Patient taking differently: Take 3.125 mg by mouth 2 (two) times daily with a meal.  07/31/17   Branch, Alphonse Guild, MD  furosemide (LASIX) 20 MG tablet TAKE 1 TABLET BY MOUTH EVERY DAY AS NEEDED FOR SWELLING 08/23/18   Arnoldo Lenis, MD   ibuprofen (ADVIL,MOTRIN) 200 MG tablet Take 800 mg by mouth every 6 (six) hours as needed for fever, headache or mild pain.     [provider]  isosorbide mononitrate (IMDUR) 60 MG 24 hr tablet Take 1 tablet (60 mg total) by mouth daily. 07/03/18   Arnoldo Lenis, MD  lisinopril (PRINIVIL,ZESTRIL) 2.5 MG tablet TAKE 1 TABLET(2.5 MG) BY MOUTH DAILY Patient taking differently: Take 2.5 mg by mouth daily.  02/07/18   Arnoldo Lenis, MD  metFORMIN (GLUCOPHAGE) 500 MG tablet Take 500 mg by mouth 2 (two) times daily.  01/31/18   [provider]  nitroGLYCERIN (NITROSTAT) 0.4 MG SL tablet Place 1 tablet (0.4 mg total) under the tongue every 5 (five) minutes as needed for chest pain. 03/02/17   Arnoldo Lenis, MD  oxyCODONE-acetaminophen (PERCOCET) 5-325 MG tablet Take 1 tablet by mouth every 8 (eight) hours as needed for severe pain. 03/07/18   Mesner, Corene Cornea, MD  pravastatin (PRAVACHOL) 80 MG tablet Take 1 tablet (80 mg total) by mouth every evening. 07/10/18 10/08/18  Herminio Commons, MD    Physical Exam: Vitals:   09/29/18 1526 09/29/18 1530 09/29/18 1700 09/29/18 1730  BP:  109/73 98/66 99/62   Pulse:  65 64   Resp:   12 17  Temp:      TempSrc:      SpO2:  96% 94%   Weight: 99.8 kg     Height: 6\' 1"  (1.854 m)       Constitutional: Awake, talking on the phone but drowsy s/p narcotic pain relief and 2 mg Ativan, calm, comfortable Vitals:   09/29/18 1526 09/29/18 1530 09/29/18 1700 09/29/18 1730  BP:  109/73 98/66 99/62   Pulse:  65 64   Resp:   12 17  Temp:      TempSrc:      SpO2:  96% 94%   Weight: 99.8 kg     Height: 6\' 1"  (1.854 m)      Eyes: PERRL, lids and conjunctivae normal ENMT: Mucous membranes are moist. Posterior pharynx clear of any exudate or lesions.  Neck: normal, supple, no masses, no thyromegaly Respiratory: clear to auscultation bilaterally, no wheezing, no crackles. Normal respiratory effort. No accessory muscle use.  Cardiovascular:  Regular rate and rhythm, no murmurs / rubs / gallops. No extremity edema. 2+ pedal pulses. No carotid bruits.  Abdomen: no tenderness, no masses palpated. No hepatosplenomegaly. Bowel sounds positive.  Musculoskeletal: no clubbing / cyanosis.  Clean dressing to left lower extremity from knee to foot. . Normal muscle tone.  Skin: no rashes, lesions, ulcers. No induration Neurologic: CN 2-12 grossly intact.  LLE not tested otherwise moving all other extremities spontaneously. Psychiatric: Normal judgment and insight. Alert and oriented x 3. Normal mood.   Labs on Admission: I have personally reviewed following labs and imaging studies  CBC: Recent Labs  Lab 09/29/18 1622  WBC 11.4*  NEUTROABS 7.3  HGB 15.0  HCT 44.7  MCV 95.3  PLT 401*   Basic Metabolic Panel: Recent Labs  Lab 09/29/18 1622  NA 137  K 3.6  CL 102  CO2 24  GLUCOSE 165*  BUN 22*  CREATININE 0.84  CALCIUM 9.3    Radiological Exams on Admission: Dg Tibia/fibula Left  Result Date: 09/29/2018 CLINICAL DATA:  Post reduction. EXAM: LEFT TIBIA AND FIBULA - 2 VIEW COMPARISON:  None. FINDINGS: Improved alignment at the distal tibia fracture site. No significant change seen at the distal fibular fracture site. Ankle mortise is symmetric. Proximal tibia and fibula appear intact and normally aligned. IMPRESSION: Improved alignment at the distal tibia fracture site status post reduction. Proximal tibia and fibula appear intact and normally aligned. Electronically Signed   By: Franki Cabot M.D.   On: 09/29/2018 17:48   Dg Tibia/fibula Left  Result Date: 09/29/2018 CLINICAL DATA:  Recent fall with lower leg deformity EXAM: LEFT TIBIA AND FIBULA - 2 VIEW COMPARISON:  None. FINDINGS: Comminuted distal tibial and fibular fractures are seen. There appears to be a rotational component to the fractures with the foot rotated laterally. The exam is somewhat limited due to the patient's positioning. IMPRESSION: Distal tibial and  fibular fractures. Electronically Signed   By: [name] M.D.   On: 09/29/2018 16:31   Dg Ankle 2 Views Left  Result Date: 09/29/2018 CLINICAL DATA:  Post reduction, LEFT tib fib. EXAM: LEFT ANKLE - 2 VIEW COMPARISON:  Plain film of the LEFT ankle from earlier same day. FINDINGS: Interval casting. Alignment at the distal tibia fracture site appears improved status post reduction and casting. Ankle mortise appears symmetric. Likely stable fracture displacement of the distal fibula. IMPRESSION: Improved alignment at the distal tibia fracture site status post reduction and casting. Electronically Signed   By: Franki Cabot M.D.   On: 09/29/2018 17:47   Dg Ankle Complete Left  Result Date: 09/29/2018 CLINICAL DATA:  Recent fall with ankle pain, initial encounter EXAM: LEFT ANKLE COMPLETE - 3+ VIEW COMPARISON:  None. FINDINGS: Comminuted distal tibial and fibular fractures are seen. Some lateral rotation is suggested of the foot with respect to the more proximal tibial and fibular shafts. Mild soft tissue swelling is noted. IMPRESSION: Comminuted distal tibial and fibular fractures. Some rotation of the foot laterally is suggested. Electronically Signed   By: [name] M.D.   On: 09/29/2018 16:32    EKG: None.  Assessment/Plan Active Problems:   Closed left ankle fracture   Closed ankle fracture-x-ray shows comminuted distal tibia-fibula fracture.  Closed reduction in the ED by Dr. Aline Brochure. -Transfer to [facility], Ortho- Dr. Marcelino Scot to consult on patient - N. P.O. midnight - EKG, portable chest x-ray as part of preop work-up -Cardiology consult in a.m. for preop clearance, considering patient's cardiac history.  CAD- 2015 with inferior STEMI, received BMS to RCA. 04/2017 cath: patent coronaries, patent RCA stent.  Patient is active, no chest pain.  04/29/2017 shows EF by cardiac cath 50 to 55%. F/w Dr. Harl Bowie. -Hold aspirin for surgery - Pending med rec resume home aspirin, Coreg, Lasix,  Imdur, lisinopril, pravastatin  Diabetes mellitus-glucose 165. -Hold metformin - SSI  HIV as part of routine health screening  DVT prophylaxis: Scds Code Status: Full Family Communication: None at bedside Disposition Plan: Per rounding team Consults called: Orthopedist Dr. Aline Brochure at [facility], and Dr. Marcelino Scot at [facility] Admission status: Inpt, tele  Enzley Kitchens Arlyce Dice MD [facility]  09/29/2018, 5:58 PM

## 2018-09-29 NOTE — ED Provider Notes (Signed)
[facility] EMERGENCY DEPARTMENT Provider Note   CSN: [id number] Arrival date & time: 09/29/18  1522    History   Chief Complaint Chief Complaint  Patient presents with  . Ankle Pain    HPI [name] is a 54 y.o. male.     HPI   52yM with L ankle/leg pain. He was cutting wood shortly before arrival when he stepped awkwardly. He isn't sure if he stepped in a hole or on a log. He had immediate onset of severe pain. EMS placed LE in splint and he had 100 mcg of fentanyl prior to arrival.   Past Medical History:  Diagnosis Date  . Anxiety   . Borderline diabetes   . Bulging disc   . Chronic back pain   . Diabetes mellitus without complication ([place])   . Hypercholesteremia   . Myocardial infarction ([place])   . Pancreatitis     Patient Active Problem List   Diagnosis Date Noted  . Angina, class II ([place]) - Low Risk ST, but persistent chest pain. 05/08/2017  . History of colonic polyps   . Diverticulosis of colon without hemorrhage   . ST-segment elevation myocardial infarction (STEMI) of inferior wall ([place]) 03/14/2014  . Presence of bare metal stent in right coronary artery: VeriFlex BMS 5.0 mm x 15 mm (5.6 mm) 03/14/2014    Class: Status post  . CAD S/P percutaneous coronary angioplasty 03/14/2014  . Chest pain 03/13/2014  . Pancreatitis 01/19/2014  . Hyperlipidemia associated with type 2 diabetes mellitus ([place]) 01/19/2014  . Diabetes mellitus ([place]) 01/19/2014    Past Surgical History:  Procedure Laterality Date  . ABDOMINAL SURGERY    . APPENDECTOMY    . BACK SURGERY    . CARDIAC CATHETERIZATION  2003   "trivial coronary artery disease," normal EF  . COLONOSCOPY N/A 04/16/2015   Procedure: COLONOSCOPY;  Surgeon: Daneil Dolin, MD;  [facility];  Service: Endoscopy;  Laterality: N/A;  12:45 PM  . COLONOSCOPY WITH PROPOFOL N/A 06/28/2018   Procedure: COLONOSCOPY WITH PROPOFOL;  Surgeon: Daneil Dolin, MD;  [facility];  Service:  Endoscopy;  Laterality: N/A;  9:45am  . LEFT HEART CATH AND CORONARY ANGIOGRAPHY N/A 05/09/2017   Procedure: LEFT HEART CATH AND CORONARY ANGIOGRAPHY;  Surgeon: Leonie Man, MD;  Location: [place] CV LAB;  Service: Cardiovascular;  Laterality: N/A;  . LEFT HEART CATHETERIZATION WITH CORONARY ANGIOGRAM N/A 03/13/2014   Procedure: LEFT HEART CATHETERIZATION WITH CORONARY ANGIOGRAM;  Surgeon: Leonie Man, MD;  Location: [facility] CATH LAB;  Service: Cardiovascular;  Laterality: N/A;  . POLYPECTOMY  06/28/2018   Procedure: POLYPECTOMY;  Surgeon: Daneil Dolin, MD;  [facility];  Service: Endoscopy;;  (colon)        Home Medications    Prior to Admission medications   Medication Sig Start Date End Date Taking? Authorizing Provider  ALPRAZolam Duanne Moron) 1 MG tablet Take 1 tablet by mouth 3 (three) times daily.  05/02/17   [provider]  aspirin 81 MG chewable tablet Chew 1 tablet (81 mg total) by mouth daily. 03/15/14   Eileen Stanford, PA-C  carvedilol (COREG) 3.125 MG tablet TAKE 1 TABLET(3.125 MG) BY MOUTH TWICE DAILY WITH A MEAL Patient taking differently: Take 3.125 mg by mouth 2 (two) times daily with a meal.  07/31/17   Arnoldo Lenis, MD  furosemide (LASIX) 20 MG tablet TAKE 1 TABLET BY MOUTH EVERY DAY AS NEEDED FOR SWELLING 08/23/18  Arnoldo Lenis, MD  ibuprofen (ADVIL,MOTRIN) 200 MG tablet Take 800 mg by mouth every 6 (six) hours as needed for fever, headache or mild pain.     [provider]  isosorbide mononitrate (IMDUR) 60 MG 24 hr tablet Take 1 tablet (60 mg total) by mouth daily. 07/03/18   Arnoldo Lenis, MD  lisinopril (PRINIVIL,ZESTRIL) 2.5 MG tablet TAKE 1 TABLET(2.5 MG) BY MOUTH DAILY Patient taking differently: Take 2.5 mg by mouth daily.  02/07/18   Arnoldo Lenis, MD  metFORMIN (GLUCOPHAGE) 500 MG tablet Take 500 mg by mouth 2 (two) times daily.  01/31/18   [provider]  nitroGLYCERIN (NITROSTAT) 0.4 MG SL  tablet Place 1 tablet (0.4 mg total) under the tongue every 5 (five) minutes as needed for chest pain. 03/02/17   Arnoldo Lenis, MD  oxyCODONE-acetaminophen (PERCOCET) 5-325 MG tablet Take 1 tablet by mouth every 8 (eight) hours as needed for severe pain. 03/07/18   Mesner, Corene Cornea, MD  pravastatin (PRAVACHOL) 80 MG tablet Take 1 tablet (80 mg total) by mouth every evening. 07/10/18 10/08/18  Herminio Commons, MD    Family History Family History  Problem Relation Age of Onset  . Diabetes Father   . Hyperlipidemia Father   . Hypertension Father   . Hypertension Mother   . Hyperlipidemia Mother   . Hyperlipidemia Sister   . Hypertension Sister   . Diabetes Sister     Social History Social History   Tobacco Use  . Smoking status: Current Every Day Smoker    Packs/day: 0.50    Years: 30.00    Pack years: 15.00    Types: Cigarettes    Start date: 10/25/1977  . Smokeless tobacco: Never Used  Substance Use Topics  . Alcohol use: Yes    Alcohol/week: 6.0 standard drinks    Types: 6 Cans of beer per week  . Drug use: No     Allergies   Patient has no known allergies.   Review of Systems Review of Systems  All systems reviewed and negative, other than as noted in HPI.  Physical Exam Updated Vital Signs BP 109/70 (BP Location: Left Arm)   Pulse (!) 58   Temp 97.6 F (36.4 C) (Oral)   Resp 14   Ht 6\' 1"  (1.854 m)   Wt 99.8 kg   SpO2 95%   BMI 29.03 kg/m   Physical Exam Vitals signs and nursing note reviewed.  Constitutional:      General: He is not in acute distress.    Appearance: He is well-developed.  HENT:     Head: Normocephalic and atraumatic.  Eyes:     General:        Right eye: No discharge.        Left eye: No discharge.     Conjunctiva/sclera: Conjunctivae normal.  Neck:     Musculoskeletal: Neck supple.  Cardiovascular:     Rate and Rhythm: Normal rate and regular rhythm.     Heart sounds: Normal heart sounds. No murmur. No friction rub.  No gallop.   Pulmonary:     Effort: Pulmonary effort is normal. No respiratory distress.     Breath sounds: Normal breath sounds.  Abdominal:     General: There is no distension.     Palpations: Abdomen is soft.     Tenderness: There is no abdominal tenderness.  Musculoskeletal:        General: Tenderness, deformity and signs of injury present.  Comments: Deformity to distal LLE just proximal to ankle. Foot rotated laterally. Closed injury. Palpable DP pulse. Sensation intact to light touch. Able to wiggle toes.   Skin:    General: Skin is warm and dry.  Neurological:     Mental Status: He is alert.  Psychiatric:        Behavior: Behavior normal.        Thought Content: Thought content normal.      ED Treatments / Results  Labs (all labs ordered are listed, but only abnormal results are displayed) Labs Reviewed  CBC WITH DIFFERENTIAL/PLATELET - Abnormal; Notable for the following components:      Result Value   WBC 11.4 (*)    Platelets 142 (*)    Abs Immature Granulocytes 0.10 (*)    All other components within normal limits  BASIC METABOLIC PANEL - Abnormal; Notable for the following components:   Glucose, Bld 165 (*)    BUN 22 (*)    All other components within normal limits  GLUCOSE, CAPILLARY - Abnormal; Notable for the following components:   Glucose-Capillary 131 (*)    All other components within normal limits  GLUCOSE, CAPILLARY - Abnormal; Notable for the following components:   Glucose-Capillary 158 (*)    All other components within normal limits  GLUCOSE, CAPILLARY - Abnormal; Notable for the following components:   Glucose-Capillary 142 (*)    All other components within normal limits  GLUCOSE, CAPILLARY - Abnormal; Notable for the following components:   Glucose-Capillary 119 (*)    All other components within normal limits  GLUCOSE, CAPILLARY - Abnormal; Notable for the following components:   Glucose-Capillary 128 (*)    All other components  within normal limits  CBG MONITORING, ED - Abnormal; Notable for the following components:   Glucose-Capillary 155 (*)    All other components within normal limits  MRSA PCR SCREENING  HIV ANTIBODY (ROUTINE TESTING W REFLEX)    EKG None  Radiology Dg Tibia/fibula Left  Result Date: 09/29/2018 CLINICAL DATA:  Post reduction. EXAM: LEFT TIBIA AND FIBULA - 2 VIEW COMPARISON:  None. FINDINGS: Improved alignment at the distal tibia fracture site. No significant change seen at the distal fibular fracture site. Ankle mortise is symmetric. Proximal tibia and fibula appear intact and normally aligned. IMPRESSION: Improved alignment at the distal tibia fracture site status post reduction. Proximal tibia and fibula appear intact and normally aligned. Electronically Signed   By: Franki Cabot M.D.   On: 09/29/2018 17:48   Dg Tibia/fibula Left  Result Date: 09/29/2018 CLINICAL DATA:  Recent fall with lower leg deformity EXAM: LEFT TIBIA AND FIBULA - 2 VIEW COMPARISON:  None. FINDINGS: Comminuted distal tibial and fibular fractures are seen. There appears to be a rotational component to the fractures with the foot rotated laterally. The exam is somewhat limited due to the patient's positioning. IMPRESSION: Distal tibial and fibular fractures. Electronically Signed   By: [name] M.D.   On: 09/29/2018 16:31   Dg Ankle 2 Views Left  Result Date: 09/29/2018 CLINICAL DATA:  Post reduction, LEFT tib fib. EXAM: LEFT ANKLE - 2 VIEW COMPARISON:  Plain film of the LEFT ankle from earlier same day. FINDINGS: Interval casting. Alignment at the distal tibia fracture site appears improved status post reduction and casting. Ankle mortise appears symmetric. Likely stable fracture displacement of the distal fibula. IMPRESSION: Improved alignment at the distal tibia fracture site status post reduction and casting. Electronically Signed   By: Franki Cabot  M.D.   On: 09/29/2018 17:47   Dg Ankle Complete Left  Result  Date: 09/29/2018 CLINICAL DATA:  Recent fall with ankle pain, initial encounter EXAM: LEFT ANKLE COMPLETE - 3+ VIEW COMPARISON:  None. FINDINGS: Comminuted distal tibial and fibular fractures are seen. Some lateral rotation is suggested of the foot with respect to the more proximal tibial and fibular shafts. Mild soft tissue swelling is noted. IMPRESSION: Comminuted distal tibial and fibular fractures. Some rotation of the foot laterally is suggested. Electronically Signed   By: [name] M.D.   On: 09/29/2018 16:32   Dg Chest Port 1 View  Result Date: 09/29/2018 CLINICAL DATA:  Preoperative evaluation, LEFT ankle fracture EXAM: PORTABLE CHEST 1 VIEW COMPARISON:  Portable exam 1804 hours compared to 03/10/2018 FINDINGS: Upper normal heart size. Mediastinal contours and pulmonary vascularity normal. Lungs clear. No infiltrate, pleural effusion or pneumothorax. Bones unremarkable. IMPRESSION: No acute abnormalities. Electronically Signed   By: Lavonia Dana M.D.   On: 09/29/2018 18:55    Procedures Procedures (including critical care time)  Medications Ordered in ED Medications  0.9 %  sodium chloride infusion ( Intravenous New Bag/Given 09/29/18 1544)  morphine 4 MG/ML injection 8 mg (8 mg Intravenous Given 09/29/18 1545)  LORazepam (ATIVAN) injection 1 mg (1 mg Intravenous Given 09/29/18 1637)  HYDROmorphone (DILAUDID) injection 1 mg (1 mg Intravenous Given 09/29/18 1640)     Initial Impression / Assessment and Plan / ED Course  I have reviewed the triage vital signs and the nursing notes.  Pertinent labs & imaging results that were available during my care of the patient were reviewed by me and considered in my medical decision making (see chart for details).        52yM with closed L distal tib/fib fractures. Closed injury. NVI. Dr Aline Brochure, orthopedic surgery, consulted. He evaluated and splinted pt in the ED. He discussed with case with Dr Marcelino Scot and plan is for admission to hospitalist  service at [facility] for definitive ORIF.   Final Clinical Impressions(s) / ED Diagnoses   Final diagnoses:  Closed extra-articular fracture of distal end of left tibia, initial encounter  Closed fracture of distal end of left fibula, unspecified fracture morphology, initial encounter    ED Discharge Orders    None       Virgel Manifold, MD 09/30/18 1306

## 2018-09-29 NOTE — Consult Note (Signed)
Patient ID: [name], male   DOB: April 27, 1966, 53 y.o.   MRN: [id number] Requested by Dr Lum Keas Reason for consultation closed left distal tib fib   Chief Complaint  Patient presents with  . Ankle Pain, left lower leg pain    DOI 3.21.2020  HPI [name] is a 53 y.o. male.  (SEE MEDICAL HISTORY) INJURED LEFT LOWER LEG TODAY  Location LEFT LOWER LEG  Duration TOADY Severity 10 Quality DULL ACHE WITH SOME BURNING  Modified by: [place] WITH ANY MOVEMENT   Review of Systems (at least 2) ROS  1. NO CHEST PAIN FOR OVER ONE YR  2. NO SOB    Past Medical History:  Diagnosis Date  . Anxiety   . Borderline diabetes   . Bulging disc   . Chronic back pain   . Diabetes mellitus without complication ([place])   . Hypercholesteremia   . Myocardial infarction ([place])   . Pancreatitis      Past Surgical History:  Procedure Laterality Date  . ABDOMINAL SURGERY    . APPENDECTOMY    . BACK SURGERY    . CARDIAC CATHETERIZATION  2003   "trivial coronary artery disease," normal EF  . COLONOSCOPY N/A 04/16/2015   Procedure: COLONOSCOPY;  Surgeon: Daneil Dolin, MD;  [facility];  Service: Endoscopy;  Laterality: N/A;  12:45 PM  . COLONOSCOPY WITH PROPOFOL N/A 06/28/2018   Procedure: COLONOSCOPY WITH PROPOFOL;  Surgeon: Daneil Dolin, MD;  [facility];  Service: Endoscopy;  Laterality: N/A;  9:45am  . LEFT HEART CATH AND CORONARY ANGIOGRAPHY N/A 05/09/2017   Procedure: LEFT HEART CATH AND CORONARY ANGIOGRAPHY;  Surgeon: Leonie Man, MD;  Location: [place] CV LAB;  Service: Cardiovascular;  Laterality: N/A;  . LEFT HEART CATHETERIZATION WITH CORONARY ANGIOGRAM N/A 03/13/2014   Procedure: LEFT HEART CATHETERIZATION WITH CORONARY ANGIOGRAM;  Surgeon: Leonie Man, MD;  Location: [facility] CATH LAB;  Service: Cardiovascular;  Laterality: N/A;  . POLYPECTOMY  06/28/2018   Procedure: POLYPECTOMY;  Surgeon: Daneil Dolin, MD;  [facility];  Service:  Endoscopy;;  (colon)     Social History  Social History   Tobacco Use  . Smoking status: Current Every Day Smoker    Packs/day: 0.50    Years: 30.00    Pack years: 15.00    Types: Cigarettes    Start date: 10/25/1977  . Smokeless tobacco: Never Used  Substance Use Topics  . Alcohol use: Yes    Alcohol/week: 6.0 standard drinks    Types: 6 Cans of beer per week  . Drug use: No    No Known Allergies  Current Facility-Administered Medications  Medication Dose Route Frequency Provider Last Rate Last Dose  . 0.9 %  sodium chloride infusion   Intravenous Continuous Virgel Manifold, MD 100 mL/hr at 09/29/18 1544    . LORazepam (ATIVAN) injection 1 mg  1 mg Intravenous Once Virgel Manifold, MD       Current Outpatient Medications  Medication Sig Dispense Refill  . ALPRAZolam (XANAX) 1 MG tablet Take 1 tablet by mouth 3 (three) times daily.   2  . aspirin 81 MG chewable tablet Chew 1 tablet (81 mg total) by mouth daily.    . carvedilol (COREG) 3.125 MG tablet TAKE 1 TABLET(3.125 MG) BY MOUTH TWICE DAILY WITH A MEAL (Patient taking differently: Take 3.125 mg by mouth 2 (two) times daily with a meal. ) 60 tablet 6  . furosemide (LASIX)  20 MG tablet TAKE 1 TABLET BY MOUTH EVERY DAY AS NEEDED FOR SWELLING 90 tablet 0  . ibuprofen (ADVIL,MOTRIN) 200 MG tablet Take 800 mg by mouth every 6 (six) hours as needed for fever, headache or mild pain.     . isosorbide mononitrate (IMDUR) 60 MG 24 hr tablet Take 1 tablet (60 mg total) by mouth daily. 30 tablet 6  . lisinopril (PRINIVIL,ZESTRIL) 2.5 MG tablet TAKE 1 TABLET(2.5 MG) BY MOUTH DAILY (Patient taking differently: Take 2.5 mg by mouth daily. ) 90 tablet 2  . metFORMIN (GLUCOPHAGE) 500 MG tablet Take 500 mg by mouth 2 (two) times daily.   5  . nitroGLYCERIN (NITROSTAT) 0.4 MG SL tablet Place 1 tablet (0.4 mg total) under the tongue every 5 (five) minutes as needed for chest pain. 25 tablet 3  . oxyCODONE-acetaminophen (PERCOCET) 5-325 MG tablet  Take 1 tablet by mouth every 8 (eight) hours as needed for severe pain. 10 tablet 0  . pravastatin (PRAVACHOL) 80 MG tablet Take 1 tablet (80 mg total) by mouth every evening. 90 tablet 3      Physical Exam-Detailed Physical Exam  Blood pressure 109/70, pulse (!) 58, temperature 97.6 F (36.4 C), temperature source Oral, resp. rate 14, height 6\' 1"  (1.854 m), weight 99.8 kg, SpO2 95 %. Gen. appearance normal habitus  Cardiovascular exam the pulses are 2+ with no peripheral edema  The ambulatory status is none due to the fracture  He appears that he has been drinking (he says one beer today) Extremity examined right leg   Inspection Range of motion assessment full range of motion as recorded Stability assessment stability test reveal no instability or laxity Muscle strength and muscle tone are normal with no atrophy or tremors Skin no major abrasions  Sensation to touch is normal The patient is oriented to person place and time The patient's mood and affect show no depression or anxiety or agitation  Left was examined and the following findings were noted  Alignment external rotation Apex anterior mild angulation  Color pink cap refill normal  Pulse DP normal  Sensation normal  Compartments soft  No motor deficits   MEDICAL DECISION MAKING (minimum/low)  Data Reviewed  I have personally reviewed the imaging studies and the report and my interpretation is:  COMMINUTED DISTAL TIB FIB FRACTURE CLOSE LEFT LOWER LEG   Assessment and Plan  Closed reduction left tib fib   Talked to Dr Marcelino Scot agreed to consult, admit to medicine AT [facility] (significant medical problems)   Post reduction xrays: Improved axial alignment and length still some angulation  I redid the splint , 1st one too tight   That relieved the pressure patient  more comfortable   Arther Abbott 09/29/2018, 4:59 PM   Carole Civil MD

## 2018-09-29 NOTE — ED Notes (Signed)
Due to increased medication given to patient, have put him on the 5 lead

## 2018-09-29 NOTE — ED Notes (Signed)
Have applied 2LNC to pt do O2 sats reading 88% on RA

## 2018-09-29 NOTE — ED Triage Notes (Signed)
Pt was cutting wood today when he stepped on a log with his left leg. Obvious Tib Fib deformity. Splint applied by EMS. 29 G R ac. 100 of Fentanyl given by EM. Pt rates pain 6/10

## 2018-09-29 NOTE — ED Notes (Signed)
Radiology at bedside

## 2018-09-30 ENCOUNTER — Inpatient Hospital Stay (HOSPITAL_COMMUNITY): Payer: Medicaid Other

## 2018-09-30 DIAGNOSIS — Z01811 Encounter for preprocedural respiratory examination: Secondary | ICD-10-CM

## 2018-09-30 DIAGNOSIS — T148XXA Other injury of unspecified body region, initial encounter: Secondary | ICD-10-CM | POA: Diagnosis not present

## 2018-09-30 LAB — GLUCOSE, CAPILLARY
Glucose-Capillary: 158 mg/dL — ABNORMAL HIGH (ref 70–99)
Glucose-Capillary: 142 mg/dL — ABNORMAL HIGH (ref 70–99)
Glucose-Capillary: 119 mg/dL — ABNORMAL HIGH (ref 70–99)
Glucose-Capillary: 128 mg/dL — ABNORMAL HIGH (ref 70–99)
Glucose-Capillary: 158 mg/dL — ABNORMAL HIGH (ref 70–99)
Glucose-Capillary: 228 mg/dL — ABNORMAL HIGH (ref 70–99)

## 2018-09-30 LAB — MRSA PCR SCREENING: MRSA by PCR: NEGATIVE

## 2018-09-30 LAB — HIV ANTIBODY (ROUTINE TESTING W REFLEX): HIV Screen 4th Generation wRfx: NONREACTIVE

## 2018-09-30 MED ORDER — NICOTINE 14 MG/24HR TD PT24
14.0000 mg | MEDICATED_PATCH | Freq: Every day | TRANSDERMAL | Status: DC
  Administered 2018-09-30: 14 mg via TRANSDERMAL
  Filled 2018-09-30: qty 1

## 2018-09-30 MED ORDER — OXYCODONE HCL 5 MG PO TABS
10.0000 mg | ORAL_TABLET | Freq: Four times a day (QID) | ORAL | Status: DC | PRN
  Administered 2018-09-30: 10 mg via ORAL
  Filled 2018-09-30: qty 2

## 2018-09-30 NOTE — Progress Notes (Signed)
PROGRESS NOTE    [name]  TOI:[id number] DOB: 21-Sep-1965 DOA: 09/29/2018 PCP: Sharilyn Sites, MD  Brief Narrative: 53 y.o. male with medical history significant for ST elevation MI and diabetes mellitus who presented to the ED with complaints of left ankle pain.  Patient was splitting wood when he stepped on a log of wood awkwardly pain and deformity to his left lower extremity.  EMS was called.  And splint was applied.  Patient denies chest pain or difficulty breathing.  Patient denies any flulike symptoms.  No recent travel no sick contacts.  ED Course: Blood pressure soft to stable 99-109, unremarkable CBC BMP.  X-ray of left lower extremity shows comminuted distal tibia-fibula fractures.  Dilaudid and morphine given in ED for pain control, and Ativan.  Orthopedist Dr. Aline Brochure was consulted in the ED, saw patient and did a close reduction in the ED, also talked to Dr. Marcelino Scot orthopedist on call at [facility] who agreed to consult on patient, and hospitalist to admit and transfer to [facility]. Assessment & Plan:   Active Problems:   Closed left ankle fracture  Closed ankle fracture-x-ray shows comminuted distal tibia-fibula fracture.  Closed reduction in the ED by Dr. Aline Brochure. -Transfer to [facility], Ortho- Dr. Marcelino Scot to consult on patient - N. P.O. midnight - EKG NSR   chest x-ray NEGATIVE. PATIENT CLEARED TO HAVE SURGERY.no further cardiac testing needed  CAD- 2015 with inferior STEMI, received BMS to RCA. 04/2017 cath: patent coronaries, patent RCA stent.  Patient is active, no chest pain.  04/29/2017 shows EF by cardiac cath 50 to 55%. F/w Dr. Harl Bowie.no echo in file .however he is very active climb stairs cuts woods without any chest pain. -Hold aspirin for surgery -  resume home aspirin, Coreg, Lasix, Imdur, lisinopril, pravastatin after surgery  Diabetes mellitus-glucose 165. -Hold metformin - SSI  DVT prophylaxis: Scds Code Status: Full Family Communication: None at  bedside Disposition Plan pending surgery Consults called: Orthopedist Dr. Aline Brochure at [facility], and Dr. Marcelino Scot at [facility]     Estimated body mass index is 29.03 kg/m as calculated from the following:   Height as of this encounter: 6\' 1"  (1.854 m).   Weight as of this encounter: 99.8 kg.    Subjective:anxiuos to have surgery and wants to leave reports he is very active climbs stairs cuttting woods  Objective: Vitals:   09/29/18 1900 09/29/18 1930 09/29/18 2118 09/30/18 0416  BP: 96/62 107/74 108/66 112/73  Pulse: (!) 56 (!) 58 67 (!) 58  Resp: 13 12    Temp:   98.2 F (36.8 C) 97.6 F (36.4 C)  TempSrc:   Oral Oral  SpO2: 95% 97% 95% 97%  Weight:      Height:        Intake/Output Summary (Last 24 hours) at 09/30/2018 0958 Last data filed at 09/30/2018 0826 Gross per 24 hour  Intake 380.62 ml  Output 1700 ml  Net -1319.38 ml   Filed Weights   09/29/18 1526  Weight: 99.8 kg    Examination:  General exam: Appears calm and comfortable  Respiratory system: Clear to auscultation. Respiratory effort normal. Cardiovascular system: S1 & S2 heard, RRR. No JVD, murmurs, rubs, gallops or clicks. No pedal edema. Gastrointestinal system: Abdomen is nondistended, soft and nontender. No organomegaly or masses felt. Normal bowel sounds heard. Central nervous system: Alert and oriented. No focal neurological deficits. Extremities:left foot in soft cast Skin: No rashes, lesions or ulcers Psychiatry: Judgement and insight appear  normal. Mood & affect appropriate.     Data Reviewed: I have personally reviewed following labs and imaging studies  CBC: Recent Labs  Lab 09/29/18 1622  WBC 11.4*  NEUTROABS 7.3  HGB 15.0  HCT 44.7  MCV 95.3  PLT 627*   Basic Metabolic Panel: Recent Labs  Lab 09/29/18 1622  NA 137  K 3.6  CL 102  CO2 24  GLUCOSE 165*  BUN 22*  CREATININE 0.84  CALCIUM 9.3   GFR: Estimated Creatinine Clearance: 126.4 mL/min (by C-G formula based  on SCr of 0.84 mg/dL). Liver Function Tests: No results for input(s): AST, ALT, ALKPHOS, BILITOT, PROT, ALBUMIN in the last 168 hours. No results for input(s): LIPASE, AMYLASE in the last 168 hours. No results for input(s): AMMONIA in the last 168 hours. Coagulation Profile: No results for input(s): INR, PROTIME in the last 168 hours. Cardiac Enzymes: No results for input(s): CKTOTAL, CKMB, CKMBINDEX, TROPONINI in the last 168 hours. BNP (last 3 results) No results for input(s): PROBNP in the last 8760 hours. HbA1C: No results for input(s): HGBA1C in the last 72 hours. CBG: Recent Labs  Lab 09/29/18 1928 09/29/18 2144 09/30/18 0105 09/30/18 0419 09/30/18 0824  GLUCAP 155* 131* 158* 142* 119*   Lipid Profile: No results for input(s): CHOL, HDL, LDLCALC, TRIG, CHOLHDL, LDLDIRECT in the last 72 hours. Thyroid Function Tests: No results for input(s): TSH, T4TOTAL, FREET4, T3FREE, THYROIDAB in the last 72 hours. Anemia Panel: No results for input(s): VITAMINB12, FOLATE, FERRITIN, TIBC, IRON, RETICCTPCT in the last 72 hours. Sepsis Labs: No results for input(s): PROCALCITON, LATICACIDVEN in the last 168 hours.  Recent Results (from the past 240 hour(s))  MRSA PCR Screening     Status: None   Collection Time: 09/29/18 11:28 PM  Result Value Ref Range Status   MRSA by PCR NEGATIVE NEGATIVE Final    Comment:        The GeneXpert MRSA Assay (FDA approved for NASAL specimens only), is one component of a comprehensive MRSA colonization surveillance program. It is not intended to diagnose MRSA infection nor to guide or monitor treatment for MRSA infections. Performed at [facility], [place] [address], [place], [place] [address]          Radiology Studies: Dg Tibia/fibula Left  Result Date: 09/29/2018 CLINICAL DATA:  Post reduction. EXAM: LEFT TIBIA AND FIBULA - 2 VIEW COMPARISON:  None. FINDINGS: Improved alignment at the distal tibia fracture site. No significant  change seen at the distal fibular fracture site. Ankle mortise is symmetric. Proximal tibia and fibula appear intact and normally aligned. IMPRESSION: Improved alignment at the distal tibia fracture site status post reduction. Proximal tibia and fibula appear intact and normally aligned. Electronically Signed   By: Franki Cabot M.D.   On: 09/29/2018 17:48   Dg Tibia/fibula Left  Result Date: 09/29/2018 CLINICAL DATA:  Recent fall with lower leg deformity EXAM: LEFT TIBIA AND FIBULA - 2 VIEW COMPARISON:  None. FINDINGS: Comminuted distal tibial and fibular fractures are seen. There appears to be a rotational component to the fractures with the foot rotated laterally. The exam is somewhat limited due to the patient's positioning. IMPRESSION: Distal tibial and fibular fractures. Electronically Signed   By: [name] M.D.   On: 09/29/2018 16:31   Dg Ankle 2 Views Left  Result Date: 09/29/2018 CLINICAL DATA:  Post reduction, LEFT tib fib. EXAM: LEFT ANKLE - 2 VIEW COMPARISON:  Plain film of the LEFT ankle from earlier same  day. FINDINGS: Interval casting. Alignment at the distal tibia fracture site appears improved status post reduction and casting. Ankle mortise appears symmetric. Likely stable fracture displacement of the distal fibula. IMPRESSION: Improved alignment at the distal tibia fracture site status post reduction and casting. Electronically Signed   By: Franki Cabot M.D.   On: 09/29/2018 17:47   Dg Ankle Complete Left  Result Date: 09/29/2018 CLINICAL DATA:  Recent fall with ankle pain, initial encounter EXAM: LEFT ANKLE COMPLETE - 3+ VIEW COMPARISON:  None. FINDINGS: Comminuted distal tibial and fibular fractures are seen. Some lateral rotation is suggested of the foot with respect to the more proximal tibial and fibular shafts. Mild soft tissue swelling is noted. IMPRESSION: Comminuted distal tibial and fibular fractures. Some rotation of the foot laterally is suggested. Electronically  Signed   By: [name] M.D.   On: 09/29/2018 16:32   Dg Chest Port 1 View  Result Date: 09/29/2018 CLINICAL DATA:  Preoperative evaluation, LEFT ankle fracture EXAM: PORTABLE CHEST 1 VIEW COMPARISON:  Portable exam 1804 hours compared to 03/10/2018 FINDINGS: Upper normal heart size. Mediastinal contours and pulmonary vascularity normal. Lungs clear. No infiltrate, pleural effusion or pneumothorax. Bones unremarkable. IMPRESSION: No acute abnormalities. Electronically Signed   By: Lavonia Dana M.D.   On: 09/29/2018 18:55        Scheduled Meds: . insulin aspart  0-9 Units Subcutaneous Q4H  . ketorolac  15 mg Intravenous Once  . nicotine  14 mg Transdermal Daily   Continuous Infusions:   LOS: 1 day    Georgette Shell, MD [facility]  If 7PM-7AM, please contact night-coverage [email or web address] Password [facility] 09/30/2018, 9:58 AM

## 2018-09-30 NOTE — Plan of Care (Signed)

## 2018-09-30 NOTE — Progress Notes (Signed)
Appreciate primary service! Patient appears stable to proceed to OR tomorrow for ORIF or external fixation depending on the skin condition.  Continue ICE and elevation. CT scan completed for operative planning.  Will see in the am pre-op for full discussion of risks, benefits, and plan. Currently on schedule at [place], [place] [facility] [phone]

## 2018-09-30 NOTE — Progress Notes (Addendum)
Contacted MD regarding pts home medications. Md advised against restarting home meds at this time stating BP was too low. Pt was educated on this and verbalized understanding. Pt was ordered some PO meds to help decrease his pain level. Continue to monitor.  Also addressed pts expiring telemetry order: MD chose not to continue the order.

## 2018-09-30 NOTE — Plan of Care (Signed)

## 2018-09-30 NOTE — Progress Notes (Signed)
Notified [facility] on call that the order was credited for the 4th generation HIV lab at [facility] due to not having the tubes. If still want the lab, it will need to be reordered for [facility] lab to perform per [facility].

## 2018-10-01 ENCOUNTER — Inpatient Hospital Stay (HOSPITAL_COMMUNITY): Payer: Medicaid Other

## 2018-10-01 ENCOUNTER — Encounter (HOSPITAL_COMMUNITY)
Admission: EM | Disposition: A | Discharge status: Home Health | Payer: Self-pay | Source: Home / Self Care | Attending: Nephrology

## 2018-10-01 DIAGNOSIS — S82302A Unspecified fracture of lower end of left tibia, initial encounter for closed fracture: Secondary | ICD-10-CM

## 2018-10-01 DIAGNOSIS — E1159 Type 2 diabetes mellitus with other circulatory complications: Secondary | ICD-10-CM

## 2018-10-01 DIAGNOSIS — E1165 Type 2 diabetes mellitus with hyperglycemia: Secondary | ICD-10-CM

## 2018-10-01 DIAGNOSIS — S82392A Other fracture of lower end of left tibia, initial encounter for closed fracture: Secondary | ICD-10-CM | POA: Diagnosis present

## 2018-10-01 DIAGNOSIS — E119 Type 2 diabetes mellitus without complications: Secondary | ICD-10-CM

## 2018-10-01 HISTORY — PX: OPEN REDUCTION INTERNAL FIXATION (ORIF) TIBIA/FIBULA FRACTURE: SHX5992

## 2018-10-01 LAB — GLUCOSE, CAPILLARY
Glucose-Capillary: 145 mg/dL — ABNORMAL HIGH (ref 70–99)
Glucose-Capillary: 171 mg/dL — ABNORMAL HIGH (ref 70–99)
Glucose-Capillary: 193 mg/dL — ABNORMAL HIGH (ref 70–99)
Glucose-Capillary: 222 mg/dL — ABNORMAL HIGH (ref 70–99)
Glucose-Capillary: 226 mg/dL — ABNORMAL HIGH (ref 70–99)

## 2018-10-01 LAB — CBC
HCT: 47.1 % (ref 39.0–52.0)
Hemoglobin: 16 g/dL (ref 13.0–17.0)
MCH: 32.3 pg (ref 26.0–34.0)
MCHC: 34 g/dL (ref 30.0–36.0)
MCV: 95.2 fL (ref 80.0–100.0)
PLATELETS: 139 10*3/uL — AB (ref 150–400)
RBC: 4.95 MIL/uL (ref 4.22–5.81)
RDW: 13.1 % (ref 11.5–15.5)
WBC: 14.5 10*3/uL — ABNORMAL HIGH (ref 4.0–10.5)
nRBC: 0 % (ref 0.0–0.2)

## 2018-10-01 LAB — CREATININE, SERUM
Creatinine, Ser: 0.99 mg/dL (ref 0.61–1.24)
GFR calc Af Amer: >60 mL/min (ref >60)
GFR calc non Af Amer: >60 mL/min (ref >60)

## 2018-10-01 SURGERY — OPEN REDUCTION INTERNAL FIXATION (ORIF) TIBIA/FIBULA FRACTURE
Anesthesia: General | Site: Leg Lower | Laterality: Left

## 2018-10-01 MED ORDER — OXYCODONE HCL 5 MG PO TABS: 5.0000 mg | ORAL_TABLET | Freq: Once | ORAL | Status: DC | PRN

## 2018-10-01 MED ORDER — ONDANSETRON HCL 4 MG/2ML IJ SOLN: 4.0000 mg | Freq: Once | INTRAMUSCULAR | Status: DC | PRN

## 2018-10-01 MED ORDER — MIDAZOLAM HCL 2 MG/2ML IJ SOLN
INTRAMUSCULAR | Status: AC
  Filled 2018-10-01: qty 2

## 2018-10-01 MED ORDER — MORPHINE SULFATE (PF) 2 MG/ML IV SOLN
2.0000 mg | Freq: Once | INTRAVENOUS | Status: AC
  Administered 2018-10-01: 2 mg via INTRAVENOUS

## 2018-10-01 MED ORDER — OXYCODONE HCL 5 MG PO TABS
10.0000 mg | ORAL_TABLET | ORAL | Status: DC | PRN
  Administered 2018-10-02 – 2018-10-03 (×4): 15 mg via ORAL
  Filled 2018-10-01 (×2): qty 3
  Filled 2018-10-01: qty 2
  Filled 2018-10-01 (×2): qty 3

## 2018-10-01 MED ORDER — OXYCODONE HCL 5 MG PO TABS
5.0000 mg | ORAL_TABLET | ORAL | Status: DC | PRN
  Administered 2018-10-01: 5 mg via ORAL
  Administered 2018-10-01 – 2018-10-02 (×3): 10 mg via ORAL
  Filled 2018-10-01: qty 2
  Filled 2018-10-01: qty 1
  Filled 2018-10-01: qty 2

## 2018-10-01 MED ORDER — ACETAMINOPHEN 500 MG PO TABS
1000.0000 mg | ORAL_TABLET | Freq: Three times a day (TID) | ORAL | Status: DC
  Administered 2018-10-01 – 2018-10-03 (×6): 1000 mg via ORAL
  Filled 2018-10-01 (×6): qty 2

## 2018-10-01 MED ORDER — LIDOCAINE 2% (20 MG/ML) 5 ML SYRINGE
INTRAMUSCULAR | Status: DC | PRN
  Administered 2018-10-01: 100 mg via INTRAVENOUS

## 2018-10-01 MED ORDER — INSULIN ASPART 100 UNIT/ML ~~LOC~~ SOLN
SUBCUTANEOUS | Status: AC
  Filled 2018-10-01: qty 1

## 2018-10-01 MED ORDER — ROCURONIUM BROMIDE 50 MG/5ML IV SOSY
PREFILLED_SYRINGE | INTRAVENOUS | Status: AC
  Filled 2018-10-01: qty 10

## 2018-10-01 MED ORDER — ACETAMINOPHEN 325 MG PO TABS: 325.0000 mg | ORAL_TABLET | Freq: Four times a day (QID) | ORAL | Status: DC | PRN

## 2018-10-01 MED ORDER — LACTATED RINGERS IV SOLN
INTRAVENOUS | Status: DC
  Administered 2018-10-01 (×2): via INTRAVENOUS

## 2018-10-01 MED ORDER — METHOCARBAMOL 1000 MG/10ML IJ SOLN
500.0000 mg | Freq: Three times a day (TID) | INTRAVENOUS | Status: DC
  Filled 2018-10-01 (×8): qty 5

## 2018-10-01 MED ORDER — LIDOCAINE 2% (20 MG/ML) 5 ML SYRINGE
INTRAMUSCULAR | Status: AC
  Filled 2018-10-01: qty 5

## 2018-10-01 MED ORDER — PROPOFOL 10 MG/ML IV BOLUS
INTRAVENOUS | Status: AC
  Filled 2018-10-01: qty 20

## 2018-10-01 MED ORDER — OXYCODONE HCL 5 MG/5ML PO SOLN: 5.0000 mg | Freq: Once | ORAL | Status: DC | PRN

## 2018-10-01 MED ORDER — FENTANYL CITRATE (PF) 250 MCG/5ML IJ SOLN
INTRAMUSCULAR | Status: AC
  Filled 2018-10-01: qty 5

## 2018-10-01 MED ORDER — HYDROMORPHONE HCL 1 MG/ML IJ SOLN: 0.2500 mg | INTRAMUSCULAR | Status: DC | PRN

## 2018-10-01 MED ORDER — PANTOPRAZOLE SODIUM 40 MG PO TBEC
40.0000 mg | DELAYED_RELEASE_TABLET | Freq: Every day | ORAL | Status: DC
  Administered 2018-10-01 – 2018-10-03 (×3): 40 mg via ORAL
  Filled 2018-10-01 (×3): qty 1

## 2018-10-01 MED ORDER — CEFAZOLIN SODIUM-DEXTROSE 2-4 GM/100ML-% IV SOLN
2.0000 g | Freq: Once | INTRAVENOUS | Status: AC
  Administered 2018-10-01: 2 g via INTRAVENOUS

## 2018-10-01 MED ORDER — PROPOFOL 10 MG/ML IV BOLUS
INTRAVENOUS | Status: DC | PRN
  Administered 2018-10-01: 200 mg via INTRAVENOUS

## 2018-10-01 MED ORDER — PRAVASTATIN SODIUM 40 MG PO TABS
80.0000 mg | ORAL_TABLET | Freq: Every evening | ORAL | Status: DC
  Administered 2018-10-01 – 2018-10-02 (×2): 80 mg via ORAL
  Filled 2018-10-01 (×2): qty 2

## 2018-10-01 MED ORDER — ONDANSETRON HCL 4 MG/2ML IJ SOLN: 4.0000 mg | Freq: Four times a day (QID) | INTRAMUSCULAR | Status: DC | PRN

## 2018-10-01 MED ORDER — SUCCINYLCHOLINE CHLORIDE 20 MG/ML IJ SOLN
INTRAMUSCULAR | Status: DC | PRN
  Administered 2018-10-01: 120 mg via INTRAVENOUS

## 2018-10-01 MED ORDER — SUGAMMADEX SODIUM 500 MG/5ML IV SOLN
INTRAVENOUS | Status: DC | PRN
  Administered 2018-10-01: 200 mg via INTRAVENOUS

## 2018-10-01 MED ORDER — ROCURONIUM BROMIDE 50 MG/5ML IV SOSY
PREFILLED_SYRINGE | INTRAVENOUS | Status: AC
  Filled 2018-10-01: qty 5

## 2018-10-01 MED ORDER — CEFAZOLIN SODIUM-DEXTROSE 2-4 GM/100ML-% IV SOLN
2.0000 g | Freq: Four times a day (QID) | INTRAVENOUS | Status: AC
  Administered 2018-10-01 – 2018-10-02 (×3): 2 g via INTRAVENOUS
  Filled 2018-10-01 (×3): qty 100

## 2018-10-01 MED ORDER — MORPHINE SULFATE (PF) 2 MG/ML IV SOLN
INTRAVENOUS | Status: AC
  Administered 2018-10-01: 2 mg via INTRAVENOUS
  Filled 2018-10-01: qty 1

## 2018-10-01 MED ORDER — ROCURONIUM BROMIDE 10 MG/ML (PF) SYRINGE
PREFILLED_SYRINGE | INTRAVENOUS | Status: DC | PRN
  Administered 2018-10-01: 50 mg via INTRAVENOUS
  Administered 2018-10-01: 10 mg via INTRAVENOUS
  Administered 2018-10-01: 30 mg via INTRAVENOUS
  Administered 2018-10-01: 10 mg via INTRAVENOUS

## 2018-10-01 MED ORDER — DEXAMETHASONE SODIUM PHOSPHATE 10 MG/ML IJ SOLN
INTRAMUSCULAR | Status: AC
  Filled 2018-10-01: qty 1

## 2018-10-01 MED ORDER — METOCLOPRAMIDE HCL 5 MG PO TABS: 5.0000 mg | ORAL_TABLET | Freq: Three times a day (TID) | ORAL | Status: DC | PRN

## 2018-10-01 MED ORDER — CARVEDILOL 3.125 MG PO TABS
3.1250 mg | ORAL_TABLET | Freq: Two times a day (BID) | ORAL | Status: DC
  Administered 2018-10-02 – 2018-10-03 (×3): 3.125 mg via ORAL
  Filled 2018-10-01 (×3): qty 1

## 2018-10-01 MED ORDER — HYDROMORPHONE HCL 1 MG/ML IJ SOLN
0.5000 mg | INTRAMUSCULAR | Status: DC | PRN
  Administered 2018-10-02 – 2018-10-03 (×3): 1 mg via INTRAVENOUS
  Filled 2018-10-01 (×3): qty 1

## 2018-10-01 MED ORDER — 0.9 % SODIUM CHLORIDE (POUR BTL) OPTIME
TOPICAL | Status: DC | PRN
  Administered 2018-10-01: 1000 mL

## 2018-10-01 MED ORDER — FENTANYL CITRATE (PF) 250 MCG/5ML IJ SOLN
INTRAMUSCULAR | Status: DC | PRN
  Administered 2018-10-01 (×3): 50 ug via INTRAVENOUS
  Administered 2018-10-01: 100 ug via INTRAVENOUS
  Administered 2018-10-01: 50 ug via INTRAVENOUS
  Administered 2018-10-01: 100 ug via INTRAVENOUS
  Administered 2018-10-01 (×3): 50 ug via INTRAVENOUS

## 2018-10-01 MED ORDER — ISOSORBIDE MONONITRATE ER 60 MG PO TB24
60.0000 mg | ORAL_TABLET | Freq: Every day | ORAL | Status: DC
  Administered 2018-10-01 – 2018-10-03 (×3): 60 mg via ORAL
  Filled 2018-10-01 (×3): qty 1

## 2018-10-01 MED ORDER — FUROSEMIDE 20 MG PO TABS: 20.0000 mg | ORAL_TABLET | Freq: Every day | ORAL | Status: DC | PRN

## 2018-10-01 MED ORDER — ONDANSETRON HCL 4 MG PO TABS: 4.0000 mg | ORAL_TABLET | Freq: Four times a day (QID) | ORAL | Status: DC | PRN

## 2018-10-01 MED ORDER — CEFAZOLIN SODIUM-DEXTROSE 2-4 GM/100ML-% IV SOLN
INTRAVENOUS | Status: AC
  Filled 2018-10-01: qty 100

## 2018-10-01 MED ORDER — METHOCARBAMOL 750 MG PO TABS
750.0000 mg | ORAL_TABLET | Freq: Three times a day (TID) | ORAL | Status: DC
  Administered 2018-10-01 – 2018-10-03 (×6): 750 mg via ORAL
  Filled 2018-10-01 (×6): qty 1

## 2018-10-01 MED ORDER — POTASSIUM CHLORIDE IN NACL 20-0.9 MEQ/L-% IV SOLN
INTRAVENOUS | Status: DC
  Administered 2018-10-01 – 2018-10-02 (×2): via INTRAVENOUS
  Filled 2018-10-01 (×3): qty 1000

## 2018-10-01 MED ORDER — DEXAMETHASONE SODIUM PHOSPHATE 10 MG/ML IJ SOLN
INTRAMUSCULAR | Status: DC | PRN
  Administered 2018-10-01: 5 mg via INTRAVENOUS

## 2018-10-01 MED ORDER — MIDAZOLAM HCL 5 MG/5ML IJ SOLN
INTRAMUSCULAR | Status: DC | PRN
  Administered 2018-10-01: 2 mg via INTRAVENOUS

## 2018-10-01 MED ORDER — BISACODYL 10 MG RE SUPP: 10.0000 mg | Freq: Every day | RECTAL | Status: DC | PRN

## 2018-10-01 MED ORDER — METOCLOPRAMIDE HCL 5 MG/ML IJ SOLN: 5.0000 mg | Freq: Three times a day (TID) | INTRAMUSCULAR | Status: DC | PRN

## 2018-10-01 MED ORDER — DOCUSATE SODIUM 100 MG PO CAPS
100.0000 mg | ORAL_CAPSULE | Freq: Two times a day (BID) | ORAL | Status: DC
  Administered 2018-10-01 – 2018-10-03 (×5): 100 mg via ORAL
  Filled 2018-10-01 (×5): qty 1

## 2018-10-01 MED ORDER — ENOXAPARIN SODIUM 40 MG/0.4ML ~~LOC~~ SOLN
40.0000 mg | SUBCUTANEOUS | Status: DC
  Administered 2018-10-02 – 2018-10-03 (×2): 40 mg via SUBCUTANEOUS
  Filled 2018-10-01 (×2): qty 0.4

## 2018-10-01 MED ORDER — SUCCINYLCHOLINE CHLORIDE 200 MG/10ML IV SOSY
PREFILLED_SYRINGE | INTRAVENOUS | Status: AC
  Filled 2018-10-01: qty 10

## 2018-10-01 SURGICAL SUPPLY — 97 items
BANDAGE ACE 4X5 VEL STRL LF (GAUZE/BANDAGES/DRESSINGS) ×7 IMPLANT
BANDAGE ACE 6X5 VEL STRL LF (GAUZE/BANDAGES/DRESSINGS) ×7 IMPLANT
BANDAGE ESMARK 6X9 LF (GAUZE/BANDAGES/DRESSINGS) ×2 IMPLANT
BIT DRILL 2.9 CANN QC NONSTRL (BIT) ×3 IMPLANT
BIT DRILL CAL 3.2 LONG (BIT) ×2 IMPLANT
BIT DRILL CAL 3.2MM LONG (BIT) ×1
BIT DRILL LCP QC 2X140 (BIT) ×6 IMPLANT
BIT DRILL SHORT 3.2MM (DRILL) ×1 IMPLANT
BLADE CLIPPER SURG (BLADE) IMPLANT
BNDG CMPR 9X6 STRL LF SNTH (GAUZE/BANDAGES/DRESSINGS) ×2
BNDG COHESIVE 4X5 TAN STRL (GAUZE/BANDAGES/DRESSINGS) IMPLANT
BNDG COHESIVE 6X5 TAN STRL LF (GAUZE/BANDAGES/DRESSINGS) IMPLANT
BNDG ESMARK 6X9 LF (GAUZE/BANDAGES/DRESSINGS) ×4
BNDG GAUZE ELAST 4 BULKY (GAUZE/BANDAGES/DRESSINGS) ×8 IMPLANT
BRUSH SCRUB SURG 4.25 DISP (MISCELLANEOUS) ×8 IMPLANT
CLOSURE WOUND 1/2 X4 (GAUZE/BANDAGES/DRESSINGS)
COVER MAYO STAND STRL (DRAPES) ×4 IMPLANT
COVER SURGICAL LIGHT HANDLE (MISCELLANEOUS) ×8 IMPLANT
COVER WAND RF STERILE (DRAPES) ×4 IMPLANT
CUFF TOURNIQUET SINGLE 18IN (TOURNIQUET CUFF) IMPLANT
DRAPE C-ARM 42X72 X-RAY (DRAPES) ×4 IMPLANT
DRAPE C-ARMOR (DRAPES) ×4 IMPLANT
DRAPE HALF SHEET 40X57 (DRAPES) ×8 IMPLANT
DRAPE INCISE IOBAN 66X45 STRL (DRAPES) ×4 IMPLANT
DRAPE U-SHAPE 47X51 STRL (DRAPES) ×4 IMPLANT
DRILL SHORT 3.2MM (DRILL) ×4
DRSG ADAPTIC 3X8 NADH LF (GAUZE/BANDAGES/DRESSINGS) ×4 IMPLANT
DRSG MEPITEL 4X7.2 (GAUZE/BANDAGES/DRESSINGS) ×6 IMPLANT
DRSG PAD ABDOMINAL 8X10 ST (GAUZE/BANDAGES/DRESSINGS) ×10 IMPLANT
ELECT REM PT RETURN 9FT ADLT (ELECTROSURGICAL) ×4
ELECTRODE REM PT RTRN 9FT ADLT (ELECTROSURGICAL) ×2 IMPLANT
EVACUATOR 1/8 PVC DRAIN (DRAIN) IMPLANT
GAUZE SPONGE 4X4 12PLY STRL (GAUZE/BANDAGES/DRESSINGS) ×7 IMPLANT
GLOVE BIO SURGEON STRL SZ7.5 (GLOVE) ×4 IMPLANT
GLOVE BIO SURGEON STRL SZ8 (GLOVE) ×4 IMPLANT
GLOVE BIOGEL PI IND STRL 7.5 (GLOVE) ×2 IMPLANT
GLOVE BIOGEL PI IND STRL 8 (GLOVE) ×2 IMPLANT
GLOVE BIOGEL PI INDICATOR 7.5 (GLOVE) ×2
GLOVE BIOGEL PI INDICATOR 8 (GLOVE) ×2
GLOVE PROGUARD SZ 7 1/2 (GLOVE) ×4 IMPLANT
GOWN STRL REUS W/ TWL LRG LVL3 (GOWN DISPOSABLE) ×4 IMPLANT
GOWN STRL REUS W/ TWL XL LVL3 (GOWN DISPOSABLE) ×2 IMPLANT
GOWN STRL REUS W/TWL LRG LVL3 (GOWN DISPOSABLE) ×8
GOWN STRL REUS W/TWL XL LVL3 (GOWN DISPOSABLE) ×4
K-WIRE ACE 1.6X6 (WIRE) ×8
KIT BASIN OR (CUSTOM PROCEDURE TRAY) ×4 IMPLANT
KIT TURNOVER KIT B (KITS) ×4 IMPLANT
KWIRE ACE 1.6X6 (WIRE) ×2 IMPLANT
MANIFOLD NEPTUNE II (INSTRUMENTS) ×4 IMPLANT
NAIL TIBAL EX 9X360MM (Nail) ×3 IMPLANT
NEEDLE 22X1 1/2 (OR ONLY) (NEEDLE) IMPLANT
NS IRRIG 1000ML POUR BTL (IV SOLUTION) ×4 IMPLANT
PACK ORTHO EXTREMITY (CUSTOM PROCEDURE TRAY) ×4 IMPLANT
PAD ARMBOARD 7.5X6 YLW CONV (MISCELLANEOUS) ×8 IMPLANT
PAD CAST 4YDX4 CTTN HI CHSV (CAST SUPPLIES) ×3 IMPLANT
PADDING CAST COTTON 4X4 STRL (CAST SUPPLIES) ×8
PADDING CAST COTTON 6X4 STRL (CAST SUPPLIES) ×11 IMPLANT
PLATE BONE LOCK COMP 6 H 118MM (Plate) ×3 IMPLANT
REAMER ROD DEEP FLUTE 2.5X950 (INSTRUMENTS) ×3 IMPLANT
SCREW ACE CAN 4.0 44M (Screw) ×3 IMPLANT
SCREW ACE CAN 4.0 46M (Screw) ×3 IMPLANT
SCREW CORT ST T8 SD REC 2.7X20 (Screw) ×3 IMPLANT
SCREW CORTEX 2.7 SLF-TPNG 18MM (Screw) ×3 IMPLANT
SCREW CORTEX 2.7X28 (Screw) ×3 IMPLANT
SCREW LOCK T8 22X2.7XST VA (Screw) ×1 IMPLANT
SCREW LOCK TITANIUM 4.0X48 (Screw) ×3 IMPLANT
SCREW LOCK TITANIUM 4.0X50 (Screw) ×3 IMPLANT
SCREW LOCK VA ST 2.7X18 (Screw) ×9 IMPLANT
SCREW LOCK VA ST 2.7X20 (Screw) ×3 IMPLANT
SCREW LOCKING 2.7X22MM (Screw) ×4 IMPLANT
SCREW LOCKING 4.0 36MM (Screw) ×3 IMPLANT
SCREW LOCKING 4.0 40MM (Screw) ×3 IMPLANT
SCREW LOCKING 4X38 (Screw) ×3 IMPLANT
SCREW METAPHYSCAL 18MM (Screw) ×3 IMPLANT
SPONGE LAP 18X18 RF (DISPOSABLE) ×7 IMPLANT
STAPLER VISISTAT 35W (STAPLE) ×4 IMPLANT
STOCKINETTE IMPERVIOUS LG (DRAPES) IMPLANT
STRIP CLOSURE SKIN 1/2X4 (GAUZE/BANDAGES/DRESSINGS) IMPLANT
SUCTION FRAZIER HANDLE 10FR (MISCELLANEOUS) ×2
SUCTION TUBE FRAZIER 10FR DISP (MISCELLANEOUS) ×2 IMPLANT
SUT ETHILON 2 0 FS 18 (SUTURE) ×9 IMPLANT
SUT ETHILON 2 0 PSLX (SUTURE) ×3 IMPLANT
SUT ETHILON 3 0 PS 1 (SUTURE) ×3 IMPLANT
SUT VIC AB 0 CT1 27 (SUTURE) ×8
SUT VIC AB 0 CT1 27XBRD ANBCTR (SUTURE) ×3 IMPLANT
SUT VIC AB 1 CT1 27 (SUTURE) ×8
SUT VIC AB 1 CT1 27XBRD ANBCTR (SUTURE) ×3 IMPLANT
SUT VIC AB 2-0 CT1 27 (SUTURE) ×12
SUT VIC AB 2-0 CT1 TAPERPNT 27 (SUTURE) ×5 IMPLANT
TOWEL OR 17X24 6PK STRL BLUE (TOWEL DISPOSABLE) ×8 IMPLANT
TOWEL OR 17X26 10 PK STRL BLUE (TOWEL DISPOSABLE) ×8 IMPLANT
TRAY FOLEY MTR SLVR 16FR STAT (SET/KITS/TRAYS/PACK) IMPLANT
TUBE CONNECTING 12'X1/4 (SUCTIONS) ×1
TUBE CONNECTING 12X1/4 (SUCTIONS) ×3 IMPLANT
UNDERPAD 30X30 (UNDERPADS AND DIAPERS) ×4 IMPLANT
WATER STERILE IRR 1000ML POUR (IV SOLUTION) ×8 IMPLANT
YANKAUER SUCT BULB TIP NO VENT (SUCTIONS) ×4 IMPLANT

## 2018-10-01 NOTE — Evaluation (Signed)
Physical Therapy Evaluation Patient Details Name: [name] MRN: [id number] DOB: December 14, 1965 Today's Date: 10/01/2018   History of Present Illness  53 y.o. male with medical history significant for ST elevation MI and diabetes mellitus who presented to the ED with complaints of left ankle pain.  Patient  stepped on a log of wood awkwardly pain and deformity to his left lower extremity. X-ray of left lower extremity shows comminuted distal tibia-fibula fractures. s/p OPEN REDUCTION INTERNAL FIXATION LEFT PILON FRACTURE (Left), TIBIA AND FIBULA, and INTRAMEDULLARY NAILING OF THE LEFT TIBIA.   Clinical Impression  Patient is s/p above surgery resulting in functional limitations due to the deficits listed below (see PT Problem List). Pt limited in safe mobility by lethargy suspected from anesthesia, as well as decrease balance and coordination while maintaining NWB through L LE. Pt requires minA for bed mobility, and modAx2 for transfers and minAx2 for hopping from bed to recliner using RW. Patient will benefit from skilled PT to increase their independence and safety with mobility to allow discharge to the venue listed below.       Follow Up Recommendations Home health PT;Supervision/Assistance - 24 hour    Equipment Recommendations  Rolling walker with 5" wheels;3in1 (PT)    Recommendations for Other Services OT consult     Precautions / Restrictions Precautions Precautions: Fall Restrictions Weight Bearing Restrictions: Yes LLE Weight Bearing: Non weight bearing      Mobility  Bed Mobility Overal bed mobility: Needs Assistance Bed Mobility: Supine to Sit     Supine to sit: Min assist     General bed mobility comments: minA for managment of L LE across bed  Transfers Overall transfer level: Needs assistance Equipment used: Rolling walker (2 wheeled) Transfers: Sit to/from Stand Sit to Stand: +2 physical assistance;From elevated surface;Mod assist         General  transfer comment: modAx2 for power up and steadying with RW  Ambulation/Gait Ambulation/Gait assistance: Min assist;+2 physical assistance Gait Distance (Feet): 3 Feet Assistive device: Rolling walker (2 wheeled) Gait Pattern/deviations: Trunk flexed(hop to) Gait velocity: slowed Gait velocity interpretation: <1.8 ft/sec, indicate of risk for recurrent falls General Gait Details: minAx2 for steadying with lateral hopping transfer from bed to recliner. increased effort and vc for sequencing        Balance Overall balance assessment: Needs assistance Sitting-balance support: Feet supported;No upper extremity supported Sitting balance-Leahy Scale: Fair     Standing balance support: Bilateral upper extremity supported Standing balance-Leahy Scale: Poor Standing balance comment: requires B UE and outside support to balance and maintain NWB L LE                             Pertinent Vitals/Pain Pain Assessment: 0-10 Pain Score: 5  Pain Location: L ankle Pain Descriptors / Indicators: Grimacing;Sore;Throbbing;Burning Pain Intervention(s): Limited activity within patient's tolerance;Monitored during session;Repositioned    Home Living Family/patient expects to be discharged to:: Private residence Living Arrangements: Alone Available Help at Discharge: Friend(s) Type of Home: House Home Access: Stairs to enter Entrance Stairs-Rails: None Entrance Stairs-Number of Steps: 5 Home Layout: One level Home Equipment: None Additional Comments: maybe able to go to girlfriends house with 2 steps to enter    Prior Function Level of Independence: Independent                  Extremity/Trunk Assessment   Upper Extremity Assessment Upper Extremity Assessment: Overall WFL for tasks assessed  Lower Extremity Assessment Lower Extremity Assessment: LLE deficits/detail LLE Deficits / Details: hip ROM and strength limited by pain in LE, knee and ankle limited by  splint LLE: Unable to fully assess due to pain;Unable to fully assess due to immobilization    Cervical / Trunk Assessment Cervical / Trunk Assessment: Normal  Communication   Communication: No difficulties  Cognition Arousal/Alertness: Lethargic Behavior During Therapy: Flat affect;WFL for tasks assessed/performed Overall Cognitive Status: Within Functional Limits for tasks assessed                                 General Comments: increased time for processing however likely due to anesthesia      General Comments General comments (skin integrity, edema, etc.): Pt dressing with drainage over L knee, rest of splint and ACE wrap clean dry and in place. VSS        Assessment/Plan    PT Assessment Patient needs continued PT services  PT Problem List Decreased activity tolerance;Decreased balance;Decreased mobility;Decreased coordination;Decreased safety awareness;Decreased knowledge of use of DME;Decreased knowledge of precautions;Pain       PT Treatment Interventions DME instruction;Gait training;Stair training;Functional mobility training;Therapeutic activities;Therapeutic exercise;Balance training;Cognitive remediation;Patient/family education    PT Goals (Current goals can be found in the Care Plan section)  Acute Rehab PT Goals Patient Stated Goal: feed dogs PT Goal Formulation: With patient Time For Goal Achievement: 10/15/18 Potential to Achieve Goals: Fair    Frequency Min 5X/week    AM-PAC PT "6 Clicks" Mobility  Outcome Measure Help needed turning from your back to your side while in a flat bed without using bedrails?: None Help needed moving from lying on your back to sitting on the side of a flat bed without using bedrails?: A Little Help needed moving to and from a bed to a chair (including a wheelchair)?: A Lot Help needed standing up from a chair using your arms (e.g., wheelchair or bedside chair)?: A Lot Help needed to walk in hospital room?:  A Lot Help needed climbing 3-5 steps with a railing? : Total 6 Click Score: 14    End of Session Equipment Utilized During Treatment: Gait belt Activity Tolerance: Patient limited by pain;Patient limited by lethargy Patient left: in chair;with call bell/phone within reach;with chair alarm set Nurse Communication: Mobility status;Precautions;Weight bearing status PT Visit Diagnosis: Unsteadiness on feet (R26.81);Other abnormalities of gait and mobility (R26.89);Muscle weakness (generalized) (M62.81);History of falling (Z91.81);Difficulty in walking, not elsewhere classified (R26.2);Pain Pain - Right/Left: Left Pain - part of body: Ankle and joints of foot    Time: 9528-4132 PT Time Calculation (min) (ACUTE ONLY): 22 min   Charges:   PT Evaluation $PT Eval Low Complexity: 1 Low          Finnegan Gatta B. Migdalia Dk PT, DPT Acute Rehabilitation Services Pager [phone] Office [phone]   [place] 10/01/2018, 5:14 PM

## 2018-10-01 NOTE — Plan of Care (Signed)
  Problem: Clinical Measurements: Goal: Diagnostic test results will improve Outcome: Progressing Goal: Respiratory complications will improve Outcome: Progressing   

## 2018-10-01 NOTE — Consult Note (Signed)
Orthopaedic Trauma Service (OTS) Consult   Patient ID: [name] MRN: [id number] DOB/AGE: [birth year] 53 y.o.   Reason for Consult: Left tibial shaft and distal tibia fracture, intra-articular Referring Physician: Arther Abbott, MD (orthopaedic surgery)   HPI: [name] is an 53 y.o. white male on disability for chronic back pain and chronic heart conditions to got tangled up in some downed tree limbs on 09/29/2018.  Patient states that his neighbor was cutting down some trees when he went outside to help his left leg got tangled in some downed limbs and he fell resulting in a twisting type mechanism to his left lower leg.  Patient had immediate onset of pain and inability to bear weight.  He was brought initially to [facility] where he was found to have a comminuted left tibia and fibula fracture with significant shortening and angulation.  Patient was seen and evaluated by the orthopedic service at [facility].  Dr. Aline Brochure asserted that this was outside the scope of his practice and felt that patient needed the expertise of an orthopedic trauma fellowship trained surgeon.  Dr. Marcelino Scot was contacted by Dr. Aline Brochure and we agreed to accept management of the patient.  Given his medical history patient was admitted to the medical service.  Upon transfer to [facility] CT scan was obtained which did confirm intra-articular involvement along with a small posterior malleolus fragment as well.  Patient seen and evaluated by the orthopedic trauma service in the preoperative holding area.  States that his pain is pretty severe in his left leg.  Denies any pain elsewhere.  Denies any numbness or tingling in his lower extremities.  Denies any baseline peripheral neuropathy related to his diabetes.   Patient is an independent ambulator.  Again he is on disability.  Lives in a house by himself with multiple animals.  States that he does have a girlfriend who lives in [place].  He lives  in [place].  States that his brother and his nephew do live close by as well.   Patient states that he has been on Percocet for many years for his chronic back pain.  Smokes about half pack a day  Does not drink all that much per his report.  Says maybe 1 beer a day if that  Denies any other drug use   He is on disability  Patient denies any active chest pain or shortness of breath.  He is able to tolerate activity.  His nuclear stress test from 2018 looked stable.  Patient does appear to be asymptomatic from a cardiac standpoint  Past Medical History:  Diagnosis Date   Anxiety    Borderline diabetes    Bulging disc    Chronic back pain    Diabetes mellitus without complication ([place])    Hypercholesteremia    Myocardial infarction [facility])    Pancreatitis     Past Surgical History:  Procedure Laterality Date   ABDOMINAL SURGERY     APPENDECTOMY     BACK SURGERY     CARDIAC CATHETERIZATION  2003   "trivial coronary artery disease," normal EF   COLONOSCOPY N/A 04/16/2015   Procedure: COLONOSCOPY;  Surgeon: Daneil Dolin, MD;  Location[facility];  Service: Endoscopy;  Laterality: N/A;  12:45 PM   COLONOSCOPY WITH PROPOFOL N/A 06/28/2018   Procedure: COLONOSCOPY WITH PROPOFOL;  Surgeon: Daneil Dolin, MD;  Location: [facility];  Service: Endoscopy;  Laterality: N/A;  9:45am   LEFT HEART CATH AND CORONARY ANGIOGRAPHY N/A 05/09/2017   Procedure: LEFT HEART CATH AND CORONARY ANGIOGRAPHY;  Surgeon: Leonie Man, MD;  Location: [place] CV LAB;  Service: Cardiovascular;  Laterality: N/A;   LEFT HEART CATHETERIZATION WITH CORONARY ANGIOGRAM N/A 03/13/2014   Procedure: LEFT HEART CATHETERIZATION WITH CORONARY ANGIOGRAM;  Surgeon: Leonie Man, MD;  Location: [facility] CATH LAB;  Service: Cardiovascular;  Laterality: N/A;   POLYPECTOMY  06/28/2018   Procedure: POLYPECTOMY;  Surgeon: Daneil Dolin, MD;  Location[facility];  Service: Endoscopy;;  (colon)     Family History  Problem Relation Age of Onset   Diabetes Father    Hyperlipidemia Father    Hypertension Father    Hypertension Mother    Hyperlipidemia Mother    Hyperlipidemia Sister    Hypertension Sister    Diabetes Sister     Social History:  reports that he has been smoking cigarettes. He started smoking about 40 years ago. He has a 15.00 pack-year smoking history. He has never used smokeless tobacco. He reports current alcohol use of about 6.0 standard drinks of alcohol per week. He reports that he does not use drugs.  Allergies: No Known Allergies  Medications: I have reviewed the patient's current medications. Current Meds  Medication Sig   aspirin 81 MG chewable tablet Chew 1 tablet (81 mg total) by mouth daily.   carvedilol (COREG) 3.125 MG tablet TAKE 1 TABLET(3.125 MG) BY MOUTH TWICE DAILY WITH A MEAL (Patient taking differently: Take 3.125 mg by mouth 2 (two) times daily with a meal. )   furosemide (LASIX) 20 MG tablet TAKE 1 TABLET BY MOUTH EVERY DAY AS NEEDED FOR SWELLING (Patient taking differently: Take 20 mg by mouth daily as needed for edema. )   ibuprofen (ADVIL,MOTRIN) 200 MG tablet Take 800 mg by mouth every 6 (six) hours as needed for fever, headache or mild pain.    isosorbide mononitrate (IMDUR) 60 MG 24 hr tablet Take 1 tablet (60 mg total) by mouth daily.   lisinopril (PRINIVIL,ZESTRIL) 2.5 MG tablet TAKE 1 TABLET(2.5 MG) BY MOUTH DAILY (Patient taking differently: Take 2.5 mg by mouth daily. )   metFORMIN (GLUCOPHAGE) 500 MG tablet Take 500 mg by mouth 2 (two) times daily.    nitroGLYCERIN (NITROSTAT) 0.4 MG SL tablet Place 1 tablet (0.4 mg total) under the tongue every 5 (five) minutes as needed for chest pain.   oxyCODONE-acetaminophen (PERCOCET) 5-325 MG tablet Take 1 tablet by mouth every 8 (eight) hours as needed for severe pain.   pravastatin (PRAVACHOL) 80 MG tablet Take 1 tablet (80 mg total) by mouth every evening.      Results for orders placed or performed during the hospital encounter of 09/29/18 (from the past 48 hour(s))  CBC with Differential     Status: Abnormal   Collection Time: 09/29/18  4:22 PM  Result Value Ref Range   WBC 11.4 (H) 4.0 - 10.5 K/uL   RBC 4.69 4.22 - 5.81 MIL/uL   Hemoglobin 15.0 13.0 - 17.0 g/dL   HCT 44.7 39.0 - 52.0 %   MCV 95.3 80.0 - 100.0 fL   MCH 32.0 26.0 - 34.0 pg   MCHC 33.6 30.0 - 36.0 g/dL   RDW 13.5 11.5 - 15.5 %   Platelets 142 (L) 150 - 400 K/uL   nRBC 0.0 0.0 - 0.2 %   Neutrophils Relative % 65 %   Neutro Abs 7.3 1.7 - 7.7 K/uL  Lymphocytes Relative 26 %   Lymphs Abs 3.0 0.7 - 4.0 K/uL   Monocytes Relative 7 %   Monocytes Absolute 0.8 0.1 - 1.0 K/uL   Eosinophils Relative 1 %   Eosinophils Absolute 0.1 0.0 - 0.5 K/uL   Basophils Relative 0 %   Basophils Absolute 0.1 0.0 - 0.1 K/uL   Immature Granulocytes 1 %   Abs Immature Granulocytes 0.10 (H) 0.00 - 0.07 K/uL    Comment: Performed at [facility], [address], [place], [place] [address]  Basic metabolic panel     Status: Abnormal   Collection Time: 09/29/18  4:22 PM  Result Value Ref Range   Sodium 137 135 - 145 mmol/L   Potassium 3.6 3.5 - 5.1 mmol/L   Chloride 102 98 - 111 mmol/L   CO2 24 22 - 32 mmol/L   Glucose, Bld 165 (H) 70 - 99 mg/dL   BUN 22 (H) 6 - 20 mg/dL   Creatinine, Ser 0.84 0.61 - 1.24 mg/dL   Calcium 9.3 8.9 - 10.3 mg/dL   GFR calc non Af Amer >60 >60 mL/min   GFR calc Af Amer >60 >60 mL/min   Anion gap 11 5 - 15    Comment: Performed at [facility], [address], [place], [place] [address]  CBG monitoring, ED     Status: Abnormal   Collection Time: 09/29/18  7:28 PM  Result Value Ref Range   Glucose-Capillary 155 (H) 70 - 99 mg/dL  Glucose, capillary     Status: Abnormal   Collection Time: 09/29/18  9:44 PM  Result Value Ref Range   Glucose-Capillary 131 (H) 70 - 99 mg/dL   Comment 1 Document in Chart   MRSA PCR Screening     Status: None   Collection  Time: 09/29/18 11:28 PM  Result Value Ref Range   MRSA by PCR NEGATIVE NEGATIVE    Comment:        The GeneXpert MRSA Assay (FDA approved for NASAL specimens only), is one component of a comprehensive MRSA colonization surveillance program. It is not intended to diagnose MRSA infection nor to guide or monitor treatment for MRSA infections. Performed at [facility], [place] [address] [place], [place] [address]   Glucose, capillary     Status: Abnormal   Collection Time: 09/30/18  1:05 AM  Result Value Ref Range   Glucose-Capillary 158 (H) 70 - 99 mg/dL   Comment 1 Document in Chart   Glucose, capillary     Status: Abnormal   Collection Time: 09/30/18  4:19 AM  Result Value Ref Range   Glucose-Capillary 142 (H) 70 - 99 mg/dL   Comment 1 Document in Chart   HIV antibody (Routine Testing)     Status: None   Collection Time: 09/30/18  4:23 AM  Result Value Ref Range   HIV Screen 4th Generation wRfx Non Reactive Non Reactive    Comment: (NOTE) Performed At: [facility] [place], [place] [id number] Rush Farmer MD HF:[id number]   Glucose, capillary     Status: Abnormal   Collection Time: 09/30/18  8:24 AM  Result Value Ref Range   Glucose-Capillary 119 (H) 70 - 99 mg/dL  Glucose, capillary     Status: Abnormal   Collection Time: 09/30/18 11:57 AM  Result Value Ref Range   Glucose-Capillary 128 (H) 70 - 99 mg/dL  Glucose, capillary     Status: Abnormal   Collection Time: 09/30/18  4:09 PM  Result  Value Ref Range   Glucose-Capillary 158 (H) 70 - 99 mg/dL  Glucose, capillary     Status: Abnormal   Collection Time: 09/30/18  9:20 PM  Result Value Ref Range   Glucose-Capillary 228 (H) 70 - 99 mg/dL  Glucose, capillary     Status: Abnormal   Collection Time: 10/01/18  8:30 AM  Result Value Ref Range   Glucose-Capillary 193 (H) 70 - 99 mg/dL   Comment 1 Notify RN    Comment 2 Document in Chart     Dg Tibia/fibula Left  Result Date:  09/29/2018 CLINICAL DATA:  Post reduction. EXAM: LEFT TIBIA AND FIBULA - 2 VIEW COMPARISON:  None. FINDINGS: Improved alignment at the distal tibia fracture site. No significant change seen at the distal fibular fracture site. Ankle mortise is symmetric. Proximal tibia and fibula appear intact and normally aligned. IMPRESSION: Improved alignment at the distal tibia fracture site status post reduction. Proximal tibia and fibula appear intact and normally aligned. Electronically Signed   By: Franki Cabot M.D.   On: 09/29/2018 17:48   Dg Tibia/fibula Left  Result Date: 09/29/2018 CLINICAL DATA:  Recent fall with lower leg deformity EXAM: LEFT TIBIA AND FIBULA - 2 VIEW COMPARISON:  None. FINDINGS: Comminuted distal tibial and fibular fractures are seen. There appears to be a rotational component to the fractures with the foot rotated laterally. The exam is somewhat limited due to the patient's positioning. IMPRESSION: Distal tibial and fibular fractures. Electronically Signed   By: [name] M.D.   On: 09/29/2018 16:31   Dg Ankle 2 Views Left  Result Date: 09/29/2018 CLINICAL DATA:  Post reduction, LEFT tib fib. EXAM: LEFT ANKLE - 2 VIEW COMPARISON:  Plain film of the LEFT ankle from earlier same day. FINDINGS: Interval casting. Alignment at the distal tibia fracture site appears improved status post reduction and casting. Ankle mortise appears symmetric. Likely stable fracture displacement of the distal fibula. IMPRESSION: Improved alignment at the distal tibia fracture site status post reduction and casting. Electronically Signed   By: Franki Cabot M.D.   On: 09/29/2018 17:47   Dg Ankle Complete Left  Result Date: 09/29/2018 CLINICAL DATA:  Recent fall with ankle pain, initial encounter EXAM: LEFT ANKLE COMPLETE - 3+ VIEW COMPARISON:  None. FINDINGS: Comminuted distal tibial and fibular fractures are seen. Some lateral rotation is suggested of the foot with respect to the more proximal tibial and  fibular shafts. Mild soft tissue swelling is noted. IMPRESSION: Comminuted distal tibial and fibular fractures. Some rotation of the foot laterally is suggested. Electronically Signed   By: [name] M.D.   On: 09/29/2018 16:32   Ct Ankle Left Wo Contrast  Result Date: 09/30/2018 CLINICAL DATA:  The patient suffered a left ankle injury when he stepped on a log a wood or possibly into a hole 09/29/2018. Initial encounter. EXAM: CT OF THE LEFT ANKLE WITHOUT CONTRAST TECHNIQUE: Multidetector CT imaging of the left ankle was performed according to the standard protocol. Multiplanar CT image reconstructions were also generated. COMPARISON:  Plain films of the left lower leg and ankle 09/29/2018. FINDINGS: Bones/Joint/Cartilage As seen on the comparison plain films, the patient has a fracture of the distal tibia. The distal diaphysis and proximal metaphysis are mildly comminuted. The fracture originates in the posterior cortex of the tibia approximately 9 cm above the plafond with a fracture line exiting the anterior cortex of the tibia 4.5 cm above the plafond. The distal fracture fragment is posteriorly displaced up to  1 cm. Nondisplaced component of the fracture extends through the plafond where a fracture line is oriented from the anterior, medial cortex through the posterior plafond centrally. There is also a nondisplaced component of the fracture through the posterior malleolus. Patient also has a displaced and mildly comminuted fracture of the distal fibula. The fracture is oblique in orientation originating 7.5 cm above the tip of the lateral malleolus. The fracture extends anteriorly and inferiorly through the anterior cortex of the fibula approximately 2 cm above the tip of the lateral malleolus. The distal fragment demonstrates approximately 1 shaft with lateral displacement and up to 1.3 cm posterior displacement. Well corticated bone fragment off the tip of the lateral malleolus consistent with remote  fracture. Small plantar calcaneal spur is incidentally noted. Ligaments Suboptimally assessed by CT. Muscles and Tendons Intact.  No evidence of tendon entrapment. Soft tissues Soft tissue contusion about the patient's fractures noted. IMPRESSION: Mildly comminuted distal tibial fracture exits the anterior cortex of the metaphysis where there is up to 1 cm posterior displacement. Nondisplaced components of the fracture extend through the central plafond and posterior malleolus. No step-off at the articular surface. Displaced and mildly comminuted distal fibular fracture. Electronically Signed   By: Inge Rise M.D.   On: 09/30/2018 16:16   Dg Chest Port 1 View  Result Date: 09/29/2018 CLINICAL DATA:  Preoperative evaluation, LEFT ankle fracture EXAM: PORTABLE CHEST 1 VIEW COMPARISON:  Portable exam 1804 hours compared to 03/10/2018 FINDINGS: Upper normal heart size. Mediastinal contours and pulmonary vascularity normal. Lungs clear. No infiltrate, pleural effusion or pneumothorax. Bones unremarkable. IMPRESSION: No acute abnormalities. Electronically Signed   By: Lavonia Dana M.D.   On: 09/29/2018 18:55    Review of Systems  Constitutional: Negative for chills and fever.  Respiratory: Negative for shortness of breath and wheezing.   Cardiovascular: Negative for chest pain and palpitations.  Gastrointestinal: Negative for abdominal pain, nausea and vomiting.  Neurological: Negative for tingling and sensory change.   Blood pressure 134/85, pulse 63, temperature 98.2 F (36.8 C), resp. rate 20, height 6\' 1"  (1.854 m), weight 99.8 kg, SpO2 95 %. Physical Exam Vitals signs and nursing note reviewed.  Constitutional:      Comments: Older appearing white male   Eyes:     Extraocular Movements: Extraocular movements intact.  Neck:     Musculoskeletal: Full passive range of motion without pain and normal range of motion.  Cardiovascular:     Rate and Rhythm: Normal rate and regular rhythm.      Heart sounds: S1 normal and S2 normal.  Pulmonary:     Comments: Clear anterior fields, diminished breath sounds at bases  Abdominal:     General: Bowel sounds are normal.     Palpations: Abdomen is soft.     Tenderness: There is no abdominal tenderness.  Musculoskeletal:     Comments: Pelvis     no traumatic wounds or rash, no ecchymosis, stable to manual stress, nontender   Left lower extremity  Inspection: SLS in place (posterior and stirrup) Thigh unremarkable  No knee effusion   Bony eval: Exquisite tenderness L ankle Knee and hip nontender  Soft tissue: Significant swelling L ankle anteriorly  Did not remove splint completely  No fracture blisters noted Moderate swelling L foot  Thigh unremarkable No open wounds/traumatic wounds  Sensation: DPN, SPN, TN sensation grossly intact  Motor: EHL, FHL, lesser toe motor intact +Quad set  Vascular: + DP pulse Ext warm  Compartments compressible No  pain out of proportion with passive stretch of lower leg compartments   Right Lower Extremity              no open wounds or lesions, no swelling or ecchymosis   Nontender hip, knee, ankle and foot             No crepitus or gross motion noted with manipulation of the leg  No knee or ankle effusion             No pain with axial loading or logrolling of the hip. Negative Stinchfield test   Knee stable to varus/ valgus and anterior/posterior stress             No pain with manipulation of the ankle or foot             No blocks to motion noted  Sens DPN, SPN, TN intact  Motor EHL, FHL, lesser toe motor, Ext, flex, evers 5/5  DP 2+, PT 2+, No significant edema             Compartments are soft and nontender, no pain with passive stretching  Bilateral upper extremity      shoulder, elbow, wrist, digits- no skin wounds, nontender, no instability, no blocks to motion  Sens  Ax/R/M/U intact  Mot   Ax/ R/ PIN/ M/ AIN/ U intact  Rad 2+   Skin:    General: Skin is warm.      Capillary Refill: Capillary refill takes less than 2 seconds.  Neurological:     Mental Status: He is alert and oriented to person, place, and time.     Comments: Unable to assess gait and coordination   Psychiatric:        Attention and Perception: Attention and perception normal.        Mood and Affect: Mood normal.        Behavior: Behavior is cooperative.      Assessment/Plan:  53 year old white male low energy left tibial shaft and tibial plafond fracture, left fibula fracture.  Chronic opiate use for chronic back pain  -Low energy fall   -Left intra-articular distal tibia fracture with shaft extension, comminuted left fibular fracture  OR today for external fixation versus intramedullary nailing with percutaneous fixation of articular block versus ORIF  Do think that the patient has a significant amount of swelling present and would anticipate significant fracture blistering this formal plate osteosynthesis would likely be delayed if that is the chosen modality.  Will evaluate today in the OR for intramedullary nailing with percutaneous fixation versus external fixation   Patient will be nonweightbearing postoperatively for the next 8 weeks  PT and OT evaluations postoperatively  No range of motion restrictions   Numerous comorbidities presents for complications including chronic opiate use which would contribute to poor bone density, nicotine dependence which also would impede his ability to heal his fracture as well as potentially lead to wound healing complications as would his diabetes.  - Pain management:  Titrate accordingly  Uses about 15 mg of oxycodone a day  - ABL anemia/Hemodynamics  Monitor  - Medical issues   Tight sugar control to minimize wound healing issues and maximize bone healing potential   - DVT/PE prophylaxis:  lovenox post op   - ID:   Ancef periop   - Metabolic Bone Disease:  Will complete metabolic bone workup given medical history  (diabetes, chronic opioid use, nicotine dependence)  - Activity:  NWB L leg  -  FEN/GI prophylaxis/Foley/Lines:  NPO for now  Reg diet post op    - Impediments to fracture healing:  DM  Chronic opioid use  Nicotine dependence   - Dispo:  OR today for L tibia/fibula     Jari Pigg, PA-C [phone] (C) 10/01/2018, 8:37 AM  [facility] [place] [id number] [phone] [name] (F)

## 2018-10-01 NOTE — Transfer of Care (Signed)
Immediate Anesthesia Transfer of Care Note  Patient: [name]  Procedure(s) Performed: OPEN REDUCTION INTERNAL FIXATION LEFT PILON FRACTURE (Left Leg Lower)  Patient Location: PACU  Anesthesia Type:General  Level of Consciousness: awake, alert  and oriented  Airway & Oxygen Therapy: Patient Spontanous Breathing and Patient connected to face mask oxygen  Post-op Assessment: Report given to RN, Post -op Vital signs reviewed and stable and Patient moving all extremities X 4  Post vital signs: Reviewed and stable  Last Vitals:  Vitals Value Taken Time  BP 164/98 10/01/2018 12:36 PM  Temp    Pulse 86 10/01/2018 12:41 PM  Resp 8 10/01/2018 12:41 PM  SpO2 89 % 10/01/2018 12:41 PM  Vitals shown include unvalidated device data.  Last Pain:  Vitals:   10/01/18 0856  TempSrc:   PainSc: 3       Patients Stated Pain Goal: 2 (70/17/79 3903)  Complications: No apparent anesthesia complications

## 2018-10-01 NOTE — Progress Notes (Signed)
Orthopedic Tech Progress Note Patient Details:  [name] 1966-02-15 [id number]  Ortho Devices Ortho Device/Splint Interventions: Application, Ordered, Adjustment       [name] Durwin Nora 10/01/2018, 3:48 PM

## 2018-10-01 NOTE — Progress Notes (Signed)
Night shift nurse prepared pt for surgery and gave report to surgical area nurse. Unable to assess pt prior to leaving floor.

## 2018-10-01 NOTE — Anesthesia Procedure Notes (Signed)
Procedure Name: Intubation Date/Time: 10/01/2018 9:22 AM Performed by: Mariea Clonts, CRNA Pre-anesthesia Checklist: Patient identified, Emergency Drugs available, Suction available, Patient being monitored and Timeout performed Patient Re-evaluated:Patient Re-evaluated prior to induction Oxygen Delivery Method: Circle system utilized Preoxygenation: Pre-oxygenation with 100% oxygen Induction Type: IV induction Laryngoscope Size: Mac and 4 Grade View: Grade I Tube type: Oral Tube size: 7.5 mm Number of attempts: 1 Placement Confirmation: ETT inserted through vocal cords under direct vision,  positive ETCO2,  CO2 detector and breath sounds checked- equal and bilateral Secured at: 23 cm Tube secured with: Tape

## 2018-10-01 NOTE — Progress Notes (Signed)
Doing well overnight. Pain up to a 7/10 this am. Was not cutting wood at time of injury rather just went to go so neighbor who was doing so.  I discussed with the patient the risks and benefits of surgery for his comminuted left tibia and fibula fractures, including the possibility of infection, nerve injury, vessel injury, wound breakdown, arthritis, symptomatic hardware, DVT/ PE, loss of motion, malunion, nonunion, and need for further surgery among others.  We also specifically discussed the potential need to stage surgery because of the elevated risk of soft tissue breakdown that could lead to amputation.  He acknowledged these risks and wished to proceed.  Altamese Corona, MD [facility], [facility] [phone]

## 2018-10-01 NOTE — Progress Notes (Addendum)
PROGRESS NOTE    SLATER MCMANAMAN  GOT:[id number] DOB: 14-May-1966 DOA: 09/29/2018 PCP: Sharilyn Sites, MD  Brief Narrative: 53 y.o. male with medical history significant for ST elevation MI and diabetes mellitus who presented to the ED with complaints of left ankle pain.  Patient was splitting wood when he stepped on a log of wood awkwardly pain and deformity to his left lower extremity.  EMS was called.  And splint was applied. Patient denies chest pain or difficulty breathing.  Patient denies any flulike symptoms.  No recent travel no sick contacts. ED Course: Blood pressure soft to stable 99-109, unremarkable CBC BMP.  X-ray of left lower extremity shows comminuted distal tibia-fibula fractures.  Dilaudid and morphine given in ED for pain control, and Ativan.  Orthopedist Dr. Aline Brochure was consulted in the ED, saw patient and did a close reduction in the ED, also talked to Dr. Marcelino Scot orthopedist on call at [facility] who agreed to consult on patient, and hospitalist to admit and transfer to [facility].    Assessment & Plan:   Active Problems:   Closed left ankle fracture   Fracture   Pre-op chest exam  Closed L ankle fracture: twisted his L leg in some downed tree branches out in his yard. In ED x-ray shows comminuted distal tibia-fibula fracture.  Closed reduction in the outside ED by Dr. Aline Brochure. Then transferred to [facility] for surgery.  - patient clear for surgery by cardiology; no further cardiac testing needed - underwent ORIF L pilon fracture, tibia and fibula today 10/01/18 by Dr Marcelino Scot  CAD- 2015 with inferior STEMI, received BMS to RCA. 04/2017 cath: patent coronaries, patent RCA stent.  Patient is active, no chest pain.  04/29/2017 shows EF by cardiac cath 50 to 55%. F/w Dr. Harl Bowie. No echo in file .however he is very active climb stairs cuts woods without any chest pain. - holding ASA for surgery -  resume home aspirin, Coreg, Lasix, Imdur, lisinopril, pravastatin after surgery  Diabetes  mellitus-glucose 165. -Holding metformin, resume after surgery POD 2 per ortho - SSI  - BS's around 150- 200    DVT prophylaxis: was SCD's, will get pharm input for postop prophylaxis Code Status: Full Family Communication: None at bedside Disposition: per ortho Consults called: Orthopedist Dr. Aline Brochure at [facility], and Dr. Marcelino Scot at [place] [facility] [phone] 10/01/2018, 5:30 PM   Objective: Vitals:   10/01/18 1406 10/01/18 1414 10/01/18 1415 10/01/18 1436  BP: 131/72   (!) 141/86  Pulse: 66 74 65 72  Resp: (!) 9 10  19   Temp:    97.7 F (36.5 C)  TempSrc:    Oral  SpO2: 96% 91% 94% 90%  Weight:      Height:        Intake/Output Summary (Last 24 hours) at 10/01/2018 1655 Last data filed at 10/01/2018 1612 Gross per 24 hour  Intake 1581.98 ml  Output 1325 ml  Net 256.98 ml   Filed Weights   09/29/18 1526  Weight: 99.8 kg    Examination:  General exam: Appears calm and comfortable  Respiratory system: Clear to auscultation. Respiratory effort normal. Cardiovascular system: S1 & S2 heard, RRR. No JVD, murmurs, rubs, gallops or clicks. No pedal edema. Gastrointestinal system: Abdomen is nondistended, soft and nontender. No organomegaly or masses felt. Normal bowel sounds heard. Central nervous system: Alert and oriented. No focal neurological deficits. Extremities:left foot in soft cast Skin: No rashes, lesions or ulcers Psychiatry: Judgement and  insight appear normal. Mood & affect appropriate.     Data Reviewed: I have personally reviewed following labs and imaging studies  CBC: Recent Labs  Lab 09/29/18 1622 10/01/18 1454  WBC 11.4* 14.5*  NEUTROABS 7.3  --   HGB 15.0 16.0  HCT 44.7 47.1  MCV 95.3 95.2  PLT 142* 761*   Basic Metabolic Panel: Recent Labs  Lab 09/29/18 1622 10/01/18 1454  NA 137  --   K 3.6  --   CL 102  --   CO2 24  --   GLUCOSE 165*  --   BUN 22*  --   CREATININE 0.84 0.99  CALCIUM 9.3  --     GFR: Estimated Creatinine Clearance: 107.3 mL/min (by C-G formula based on SCr of 0.99 mg/dL). Liver Function Tests: No results for input(s): AST, ALT, ALKPHOS, BILITOT, PROT, ALBUMIN in the last 168 hours. No results for input(s): LIPASE, AMYLASE in the last 168 hours. No results for input(s): AMMONIA in the last 168 hours. Coagulation Profile: No results for input(s): INR, PROTIME in the last 168 hours. Cardiac Enzymes: No results for input(s): CKTOTAL, CKMB, CKMBINDEX, TROPONINI in the last 168 hours. BNP (last 3 results) No results for input(s): PROBNP in the last 8760 hours. HbA1C: No results for input(s): HGBA1C in the last 72 hours. CBG: Recent Labs  Lab 09/30/18 1609 09/30/18 2120 10/01/18 0830 10/01/18 1306 10/01/18 1608  GLUCAP 158* 228* 193* 222* 226*   Lipid Profile: No results for input(s): CHOL, HDL, LDLCALC, TRIG, CHOLHDL, LDLDIRECT in the last 72 hours. Thyroid Function Tests: No results for input(s): TSH, T4TOTAL, FREET4, T3FREE, THYROIDAB in the last 72 hours. Anemia Panel: No results for input(s): VITAMINB12, FOLATE, FERRITIN, TIBC, IRON, RETICCTPCT in the last 72 hours. Sepsis Labs: No results for input(s): PROCALCITON, LATICACIDVEN in the last 168 hours.  Recent Results (from the past 240 hour(s))  MRSA PCR Screening     Status: None   Collection Time: 09/29/18 11:28 PM  Result Value Ref Range Status   MRSA by PCR NEGATIVE NEGATIVE Final    Comment:        The GeneXpert MRSA Assay (FDA approved for NASAL specimens only), is one component of a comprehensive MRSA colonization surveillance program. It is not intended to diagnose MRSA infection nor to guide or monitor treatment for MRSA infections. Performed at [facility], [place] [address], [place], [place] [address]          Radiology Studies: Dg Tibia/fibula Left  Result Date: 10/01/2018 CLINICAL DATA:  ORIF of left ankle fracture EXAM: LEFT TIBIA AND FIBULA - 2 VIEW; DG C-ARM  61-120 MIN COMPARISON:  09/29/2018 FLUOROSCOPY TIME:  Fluoroscopy Time:  1 minutes 55 seconds Radiation Exposure Index (if provided by the fluoroscopic device): 2.19 mGy Number of Acquired Spot Images: 11 FINDINGS: Initial images demonstrate a fixation sideplate along the distal fibula. Fracture fragments are in near anatomic alignment. Medullary rod is subsequently placed within the tibia with proximal and distal fixation screws. Fixation screws are also noted in the distal aspect of the tibia. Fracture fragments are in near anatomic alignment. IMPRESSION: ORIF of distal tibial and fibular fractures. Electronically Signed   By: [name] M.D.   On: 10/01/2018 12:50   Dg Tibia/fibula Left  Result Date: 09/29/2018 CLINICAL DATA:  Post reduction. EXAM: LEFT TIBIA AND FIBULA - 2 VIEW COMPARISON:  None. FINDINGS: Improved alignment at the distal tibia fracture site. No significant change seen at the distal fibular fracture  site. Ankle mortise is symmetric. Proximal tibia and fibula appear intact and normally aligned. IMPRESSION: Improved alignment at the distal tibia fracture site status post reduction. Proximal tibia and fibula appear intact and normally aligned. Electronically Signed   By: Franki Cabot M.D.   On: 09/29/2018 17:48   Dg Ankle 2 Views Left  Result Date: 09/29/2018 CLINICAL DATA:  Post reduction, LEFT tib fib. EXAM: LEFT ANKLE - 2 VIEW COMPARISON:  Plain film of the LEFT ankle from earlier same day. FINDINGS: Interval casting. Alignment at the distal tibia fracture site appears improved status post reduction and casting. Ankle mortise appears symmetric. Likely stable fracture displacement of the distal fibula. IMPRESSION: Improved alignment at the distal tibia fracture site status post reduction and casting. Electronically Signed   By: Franki Cabot M.D.   On: 09/29/2018 17:47   Dg Ankle Complete Left  Result Date: 10/01/2018 CLINICAL DATA:  Left lower leg ORIF EXAM: LEFT ANKLE COMPLETE -  3+ VIEW COMPARISON:  10/01/2018 FINDINGS: Comminuted distal tibial metaphyseal fracture transfixed with a intramedullary nail and multiple interlocking screws. 2 screws transfixing the posterior tibial malleolus. Comminuted fracture of the distal fibula transfixed with a lateral sideplate and multiple interlocking screws. Alignment of the fractures is near anatomic. Ankle mortise is intact. Postsurgical changes in the surrounding soft tissues. IMPRESSION: 1. Interval ORIF of the distal tibial and fibular fractures. Electronically Signed   By: Kathreen Devoid   On: 10/01/2018 13:33   Ct Ankle Left Wo Contrast  Result Date: 09/30/2018 CLINICAL DATA:  The patient suffered a left ankle injury when he stepped on a log a wood or possibly into a hole 09/29/2018. Initial encounter. EXAM: CT OF THE LEFT ANKLE WITHOUT CONTRAST TECHNIQUE: Multidetector CT imaging of the left ankle was performed according to the standard protocol. Multiplanar CT image reconstructions were also generated. COMPARISON:  Plain films of the left lower leg and ankle 09/29/2018. FINDINGS: Bones/Joint/Cartilage As seen on the comparison plain films, the patient has a fracture of the distal tibia. The distal diaphysis and proximal metaphysis are mildly comminuted. The fracture originates in the posterior cortex of the tibia approximately 9 cm above the plafond with a fracture line exiting the anterior cortex of the tibia 4.5 cm above the plafond. The distal fracture fragment is posteriorly displaced up to 1 cm. Nondisplaced component of the fracture extends through the plafond where a fracture line is oriented from the anterior, medial cortex through the posterior plafond centrally. There is also a nondisplaced component of the fracture through the posterior malleolus. Patient also has a displaced and mildly comminuted fracture of the distal fibula. The fracture is oblique in orientation originating 7.5 cm above the tip of the lateral malleolus. The  fracture extends anteriorly and inferiorly through the anterior cortex of the fibula approximately 2 cm above the tip of the lateral malleolus. The distal fragment demonstrates approximately 1 shaft with lateral displacement and up to 1.3 cm posterior displacement. Well corticated bone fragment off the tip of the lateral malleolus consistent with remote fracture. Small plantar calcaneal spur is incidentally noted. Ligaments Suboptimally assessed by CT. Muscles and Tendons Intact.  No evidence of tendon entrapment. Soft tissues Soft tissue contusion about the patient's fractures noted. IMPRESSION: Mildly comminuted distal tibial fracture exits the anterior cortex of the metaphysis where there is up to 1 cm posterior displacement. Nondisplaced components of the fracture extend through the central plafond and posterior malleolus. No step-off at the articular surface. Displaced and mildly comminuted distal  fibular fracture. Electronically Signed   By: Inge Rise M.D.   On: 09/30/2018 16:16   Dg Chest Port 1 View  Result Date: 09/29/2018 CLINICAL DATA:  Preoperative evaluation, LEFT ankle fracture EXAM: PORTABLE CHEST 1 VIEW COMPARISON:  Portable exam 1804 hours compared to 03/10/2018 FINDINGS: Upper normal heart size. Mediastinal contours and pulmonary vascularity normal. Lungs clear. No infiltrate, pleural effusion or pneumothorax. Bones unremarkable. IMPRESSION: No acute abnormalities. Electronically Signed   By: Lavonia Dana M.D.   On: 09/29/2018 18:55   Dg Tibia/fibula Left Port  Result Date: 10/01/2018 CLINICAL DATA:  Status post ORIF EXAM: PORTABLE LEFT TIBIA AND FIBULA - 2 VIEW COMPARISON:  Intraoperative films from earlier in the same day. FINDINGS: Medullary rod is noted within the tibia with proximal and distal fixation screws. Fixation sideplate along the distal fibula is seen. Splinting material is noted. No acute abnormality is noted. IMPRESSION: Status post ORIF of distal tibial and fibular  fractures. Electronically Signed   By: [name] M.D.   On: 10/01/2018 13:30   Dg C-arm 1-60 Min  Result Date: 10/01/2018 CLINICAL DATA:  ORIF of left ankle fracture EXAM: LEFT TIBIA AND FIBULA - 2 VIEW; DG C-ARM 61-120 MIN COMPARISON:  09/29/2018 FLUOROSCOPY TIME:  Fluoroscopy Time:  1 minutes 55 seconds Radiation Exposure Index (if provided by the fluoroscopic device): 2.19 mGy Number of Acquired Spot Images: 11 FINDINGS: Initial images demonstrate a fixation sideplate along the distal fibula. Fracture fragments are in near anatomic alignment. Medullary rod is subsequently placed within the tibia with proximal and distal fixation screws. Fixation screws are also noted in the distal aspect of the tibia. Fracture fragments are in near anatomic alignment. IMPRESSION: ORIF of distal tibial and fibular fractures. Electronically Signed   By: [name] M.D.   On: 10/01/2018 12:50        Scheduled Meds:  acetaminophen  1,000 mg Oral Q8H   docusate sodium  100 mg Oral BID   [START ON 10/02/2018] enoxaparin (LOVENOX) injection  40 mg Subcutaneous Q24H   insulin aspart       insulin aspart  0-9 Units Subcutaneous Q4H   methocarbamol  750 mg Oral TID   pantoprazole  40 mg Oral Daily   Continuous Infusions:  0.9 % NaCl with KCl 20 mEq / L 50 mL/hr at 10/01/18 1517    ceFAZolin (ANCEF) IV 2 g (10/01/18 1601)   lactated ringers 10 mL/hr at 10/01/18 0850   methocarbamol (ROBAXIN) IV       LOS: 2 days    Sol Blazing, MD [facility]  If 7PM-7AM, please contact night-coverage [email or web address] Password [facility] 10/01/2018, 4:55 PM

## 2018-10-01 NOTE — Op Note (Signed)
09/29/2018 - 10/01/2018  12:14 PM  PATIENT:  [name]  53 y.o. male  PRE-OPERATIVE DIAGNOSIS:  LEFT TIBIAL PILON FRACTURE, TIBIA AND FIBULA  POST-OPERATIVE DIAGNOSIS:  LEFT TIBIAL PILON FRACTURE, TIBIA AND FIBULA  PROCEDURE:  Procedure(s): 1. OPEN REDUCTION INTERNAL FIXATION LEFT PILON FRACTURE (Left), TIBIA AND FIBULA 2. INTRAMEDULLARY NAILING OF THE LEFT TIBIA WITH SYNTHES 9 X 360 MM, STATICALLY LOCKED  SURGEON:  Surgeon(s) and Role:    Altamese Vaughn, MD - Primary  PHYSICIAN ASSISTANT: Ainsley Spinner, PA-C  ANESTHESIA:   general  EBL:  50 mL   BLOOD ADMINISTERED:none  DRAINS: none   LOCAL MEDICATIONS USED:  NONE  SPECIMEN:  No Specimen  DISPOSITION OF SPECIMEN:  N/A  COUNTS:  YES  TOURNIQUET:  * No tourniquets in log *  DICTATION: .Note written in EPIC  PLAN OF CARE: Admit to inpatient   PATIENT DISPOSITION:  PACU - hemodynamically stable.   Delay start of Pharmacological VTE agent (>24hrs) due to surgical blood loss or risk of bleeding: no  BRIEF SUMMARY AND INDICATION FOR PROCEDURE:   [name] is a 53 y.o.-year- old who sustained severe left leg injuries in ground level fall over tree limbs. The patient required splinting, elevation, and a period of soft tissue swelling resolution before safely undergoing surgical repair.  We did discuss the risks and benefits of surgical reconstruction including the possibility of infection, nerve injury, vessel injury, malunion, nonunion, DVT, PE, heart attack, stroke, loss of motion, symptomatic hardware, need for further surgery, and others.   The patient acknowledged these risks and wished to proceed.   BRIEF SUMMARY OF PROCEDURE:  After administration of preoperative antibiotic, the patient was taken to the operating room.  Following a chlorhexidine wash, the left lower extremity was prepped with betadine scrub and paint and then draped in usual sterile fashion.  No tourniquet was used during the procedure. I  began with the fibula fracture because of the need to restore length and rotation. I made a full length incision but protected all deep periosteal attachments except for the posterior proximal edge where I was able to evaluate the reduction. I used a tenaculum distally to restore length and then derotated the fracture into a reduced position. I secured this with screws into a [employer] anatomic fibula plate and had to place it somewhat anterior to get the best fit and accommodate the clamp. X-rays confirmed reduction and so I continued with securing 5 locked distal screws and three bicortical proximal screws. The wound was irrigated thoroughly and then closed in standard layered fashion with #1 vicryl for fragment of anterior cortex, 0 Vicryl, 2-0 Vicryl, 2-0 nylon. Ainsley Spinner, PA-C, assisted me and his assistance was necessary to maintain reduction while applying fixation.  There was simply too much soft tissue swelling to allow for a second incision and exposure of the tibia. Consequently the decision was made to place percutaneous screws at the joint line for internal fixation and then follow that with intramedullary nailing of the entire tibia. I began with the tibial plafond component, brought in the C-arm,and then employed lag screw fixation perpendicular to the fracture lines at the joint through stab incisions to reduce and compress the articular injury. This consisted of securing the tillaux fragment of the anterolateral corner with an anterior-to-posterior screw along the lateral side of the tibia, and then placing a second anterolateral to posteromedial screw at the joint line in similar fashion with a cannulated 4.30mm titanium screw from [employer].  These were all placed to secure and compress the fractures so that I could seat the nail in the middle of this nest of screws without jeopardizing fixation of the articular component.  Once reconstruction of the plafond was complete, the bone foam was  removed, radiolucent triangle brought in.  The knee was bent up to 90 degrees and a 2.5 cm incision was made extending proximally from the distal pole of the patella.  In the deep tissues a medial parapatellar retinacular incision was made and then the curved cannulated awl inserted anterior to the edge of the articular surface and just medial to the lateral tibial spine. It was advance into the center of the proximal tibia.  A ball-tipped guidewire was bent and then advanced into the proximal tibia and then across the fracture site, watching on orthogonal views until it was centered in the distal plafond.  While maintaining fracture reduction, the tibia was then reamed sequentially.  We encountered chatter at 9 mm, reamed to 10 mm, and placed 9 x 360 mm, statically locked nail. We did use the proximal locks off the jig and perfect circle technique for the distal locking bolts.  I also placed an anteriormedial to posterolateral distal lock through a formal incision. All screws were checked for position within the nail and length.  Ainsley Spinner, PA-C, assisted me throughout, and his assistance was necessary as I held the reduction while he reamed and placed the nail.  Wounds irrigated thoroughly, closed in standard layered fashion using 0 Vicryl, 2-0 Vicryl, 3-0 nylon.  Sterile gently compressive dressing and splint were applied.  The patient was then taken to the PACU in stable condition.   PROGNOSIS:  Patient will be nonweightbearing on the left lower extremity with unrestricted range of motion of the knee.  Will anticipate removing the sutures, re-evaluate in 10 to 14 days, at which time, he will likely be transitioned from splint into a CAM boot to allow for unrestricted range of motion of the ankle as well.  Weightbearing will be anticipated to resume at 6 weeks.  He will be on a formal pharmacologic DVT prophylaxis while in the hospital and Rehab consultation is anticipated at this time.  Metformin to resume POD 2. D/c tomorrow if does sufficiently well with PT.  Altamese [place], MD [facility] [phone] (p)

## 2018-10-01 NOTE — Anesthesia Postprocedure Evaluation (Signed)
Anesthesia Post Note  Patient: Javyon D Borntreger  Procedure(s) Performed: OPEN REDUCTION INTERNAL FIXATION LEFT PILON FRACTURE (Left Leg Lower)     Patient location during evaluation: PACU Anesthesia Type: General Level of consciousness: awake and alert Pain management: pain level controlled Vital Signs Assessment: post-procedure vital signs reviewed and stable Respiratory status: spontaneous breathing, nonlabored ventilation and respiratory function stable Cardiovascular status: blood pressure returned to baseline and stable Postop Assessment: no apparent nausea or vomiting Anesthetic complications: no    Last Vitals:  Vitals:   10/01/18 1415 10/01/18 1436  BP:  (!) 141/86  Pulse: 65 72  Resp:  19  Temp:  36.5 C  SpO2: 94% 90%    Last Pain:  Vitals:   10/01/18 1436  TempSrc: Oral  PainSc:                  Lidia Collum

## 2018-10-01 NOTE — Anesthesia Preprocedure Evaluation (Addendum)
Anesthesia Evaluation  Patient identified by MRN, date of birth, ID band Patient awake    Reviewed: Allergy & Precautions, NPO status , Patient's Chart, lab work & pertinent test results, reviewed documented beta blocker date and time   History of Anesthesia Complications Negative for: history of anesthetic complications  Airway Mallampati: II  TM Distance: >3 FB Neck ROM: Full    Dental  (+) Edentulous Upper, Edentulous Lower   Pulmonary neg pulmonary ROS, Current Smoker,    Pulmonary exam normal        Cardiovascular hypertension, Pt. on medications and Pt. on home beta blockers + angina + CAD, + Past MI and + Cardiac Stents  Normal cardiovascular exam     Neuro/Psych negative neurological ROS  negative psych ROS   GI/Hepatic negative GI ROS, Neg liver ROS,   Endo/Other  diabetes  Renal/GU negative Renal ROS  negative genitourinary   Musculoskeletal negative musculoskeletal ROS (+)   Abdominal   Peds  Hematology negative hematology ROS (+)   Anesthesia Other Findings Last saw cardiology 06/2018- no additional testing recommended at that time.  "CAD - admit 03/2014 with inferior STEMI, received BMS to RCA. LVEF 45-50% by LV gram. 02/2017 nuclear stress: mild apical ischemia vs artifact. Overall low risk -02/2017 nuclear stress: inferior ischemia 04/2017 cath: patent coronaries, patent RCA stent. Elevated LVEDP 26  - some recent chest pain. Dull pain. Cramp like feeling. Can occur at any time, not specifically exertional - compliant with meds"  Reproductive/Obstetrics                            Anesthesia Physical Anesthesia Plan  ASA: III  Anesthesia Plan: General   Post-op Pain Management:    Induction: Intravenous  PONV Risk Score and Plan: 2 and Ondansetron, Dexamethasone, Midazolam and Treatment may vary due to age or medical condition  Airway Management Planned: Oral  ETT  Additional Equipment: None  Intra-op Plan:   Post-operative Plan: Extubation in OR  Informed Consent: I have reviewed the patients History and Physical, chart, labs and discussed the procedure including the risks, benefits and alternatives for the proposed anesthesia with the patient or authorized representative who has indicated his/her understanding and acceptance.     Dental advisory given  Plan Discussed with:   Anesthesia Plan Comments:        Anesthesia Quick Evaluation

## 2018-10-02 ENCOUNTER — Encounter (HOSPITAL_COMMUNITY): Payer: Self-pay | Admitting: Orthopedic Surgery

## 2018-10-02 DIAGNOSIS — S82832A Other fracture of upper and lower end of left fibula, initial encounter for closed fracture: Secondary | ICD-10-CM

## 2018-10-02 DIAGNOSIS — S82202A Unspecified fracture of shaft of left tibia, initial encounter for closed fracture: Secondary | ICD-10-CM | POA: Diagnosis present

## 2018-10-02 DIAGNOSIS — F17200 Nicotine dependence, unspecified, uncomplicated: Secondary | ICD-10-CM | POA: Diagnosis present

## 2018-10-02 LAB — BASIC METABOLIC PANEL
Anion gap: 9 (ref 5–15)
BUN: 13 mg/dL (ref 6–20)
CO2: 28 mmol/L (ref 22–32)
Calcium: 8.8 mg/dL — ABNORMAL LOW (ref 8.9–10.3)
Chloride: 98 mmol/L (ref 98–111)
Creatinine, Ser: 0.92 mg/dL (ref 0.61–1.24)
GFR calc Af Amer: >60 mL/min (ref >60)
GFR calc non Af Amer: >60 mL/min (ref >60)
Glucose, Bld: 164 mg/dL — ABNORMAL HIGH (ref 70–99)
Potassium: 3.9 mmol/L (ref 3.5–5.1)
Sodium: 135 mmol/L (ref 135–145)

## 2018-10-02 LAB — CBC
HEMATOCRIT: 43.4 % (ref 39.0–52.0)
HEMOGLOBIN: 14.8 g/dL (ref 13.0–17.0)
MCH: 32.2 pg (ref 26.0–34.0)
MCHC: 34.1 g/dL (ref 30.0–36.0)
MCV: 94.3 fL (ref 80.0–100.0)
Platelets: 144 10*3/uL — ABNORMAL LOW (ref 150–400)
RBC: 4.6 MIL/uL (ref 4.22–5.81)
RDW: 13.1 % (ref 11.5–15.5)
WBC: 16.1 10*3/uL — ABNORMAL HIGH (ref 4.0–10.5)
nRBC: 0 % (ref 0.0–0.2)

## 2018-10-02 LAB — GLUCOSE, CAPILLARY
Glucose-Capillary: 165 mg/dL — ABNORMAL HIGH (ref 70–99)
Glucose-Capillary: 192 mg/dL — ABNORMAL HIGH (ref 70–99)
Glucose-Capillary: 156 mg/dL — ABNORMAL HIGH (ref 70–99)
Glucose-Capillary: 224 mg/dL — ABNORMAL HIGH (ref 70–99)
Glucose-Capillary: 170 mg/dL — ABNORMAL HIGH (ref 70–99)

## 2018-10-02 LAB — HEMOGLOBIN A1C
Hgb A1c MFr Bld: 8 % — ABNORMAL HIGH (ref 4.8–5.6)
Mean Plasma Glucose: 182.9 mg/dL

## 2018-10-02 MED ORDER — INSULIN ASPART 100 UNIT/ML ~~LOC~~ SOLN: 2.0000 [IU] | Freq: Three times a day (TID) | SUBCUTANEOUS | Status: DC

## 2018-10-02 MED ORDER — INSULIN ASPART 100 UNIT/ML ~~LOC~~ SOLN: 0.0000 [IU] | Freq: Three times a day (TID) | SUBCUTANEOUS | Status: DC

## 2018-10-02 MED ORDER — ASPIRIN 81 MG PO CHEW
81.0000 mg | CHEWABLE_TABLET | Freq: Every day | ORAL | Status: DC
  Administered 2018-10-02 – 2018-10-03 (×2): 81 mg via ORAL
  Filled 2018-10-02 (×2): qty 1

## 2018-10-02 MED ORDER — VITAMIN D 25 MCG (1000 UNIT) PO TABS
2000.0000 [IU] | ORAL_TABLET | Freq: Two times a day (BID) | ORAL | Status: DC
  Administered 2018-10-02 – 2018-10-03 (×3): 2000 [IU] via ORAL
  Filled 2018-10-02 (×3): qty 2

## 2018-10-02 MED ORDER — INSULIN ASPART 100 UNIT/ML ~~LOC~~ SOLN: 0.0000 [IU] | Freq: Every day | SUBCUTANEOUS | Status: DC

## 2018-10-02 MED ORDER — INSULIN ASPART 100 UNIT/ML ~~LOC~~ SOLN
2.0000 [IU] | Freq: Three times a day (TID) | SUBCUTANEOUS | Status: DC
  Administered 2018-10-02 – 2018-10-03 (×2): 2 [IU] via SUBCUTANEOUS

## 2018-10-02 MED ORDER — INSULIN ASPART 100 UNIT/ML ~~LOC~~ SOLN
0.0000 [IU] | Freq: Three times a day (TID) | SUBCUTANEOUS | Status: DC
  Administered 2018-10-02: 3 [IU] via SUBCUTANEOUS
  Administered 2018-10-03: 2 [IU] via SUBCUTANEOUS

## 2018-10-02 MED ORDER — VITAMIN C 500 MG PO TABS
500.0000 mg | ORAL_TABLET | Freq: Every day | ORAL | Status: DC
  Administered 2018-10-02 – 2018-10-03 (×2): 500 mg via ORAL
  Filled 2018-10-02 (×2): qty 1

## 2018-10-02 MED ORDER — LISINOPRIL 2.5 MG PO TABS
2.5000 mg | ORAL_TABLET | Freq: Every day | ORAL | Status: DC
  Administered 2018-10-03: 2.5 mg via ORAL
  Filled 2018-10-02: qty 1

## 2018-10-02 MED ORDER — GABAPENTIN 300 MG PO CAPS
300.0000 mg | ORAL_CAPSULE | Freq: Two times a day (BID) | ORAL | Status: DC
  Administered 2018-10-02 – 2018-10-03 (×3): 300 mg via ORAL
  Filled 2018-10-02 (×3): qty 1

## 2018-10-02 NOTE — Progress Notes (Signed)
Inpatient Diabetes Program Recommendations  AACE/ADA: New Consensus Statement on Inpatient Glycemic Control (2015)  Target Ranges:  Prepandial:   less than 140 mg/dL      Peak postprandial:   less than 180 mg/dL (1-2 hours)      Critically ill patients:  140 - 180 mg/dL   Lab Results  Component Value Date   GLUCAP 156 (H) 10/02/2018   HGBA1C 8.0 (H) 10/02/2018    Review of Glycemic Control Results for [name], [name] (MRN [id number]) as of 10/02/2018 13:32  Ref. Range 10/01/2018 23:54 10/02/2018 04:21 10/02/2018 09:58 10/02/2018 12:03  Glucose-Capillary Latest Ref Range: 70 - 99 mg/dL 145 (H) 165 (H) 192 (H) 156 (H)   Diabetes history: Type 2 DM Outpatient Diabetes medications: Metformin 500 mg BID Current orders for Inpatient glycemic control: Novolog 0-9 units TID, Novolog 0-5 units QHS, Novolog 2 units TID  Inpatient Diabetes Program Recommendations:    Noted consult for recs for DC. Patient was taking Metformin, but experienced GI side effects. Wants alternative. Could consider Amaryl 2 mg QAM and decreasing dose of Metformin to 500 mg QD and have patient follow up with PCP. Will place consult for case management to help arrange PCP outpatient.  Thanks, Bronson Curb, MSN, RNC-OB Diabetes Coordinator [phone] (8a-5p)

## 2018-10-02 NOTE — Progress Notes (Signed)
Occupational Therapy Evaluation Patient Details Name: [name] MRN: [id number] DOB: [birth year] Today's Date: 10/02/2018    History of Present Illness 53 y.o. male with medical history significant for ST elevation MI and diabetes mellitus who presented to the ED with complaints of left ankle pain.  Patient  stepped on a log of wood awkwardly pain and deformity to his left lower extremity. X-ray of left lower extremity shows comminuted distal tibia-fibula fractures. s/p OPEN REDUCTION INTERNAL FIXATION LEFT PILON FRACTURE (Left), TIBIA AND FIBULA, and INTRAMEDULLARY NAILING OF THE LEFT TIBIA.    Clinical Impression   PTA pt PLOF Independent in all ADLs and IADLs. Pt currently requires Min A for functional mobility, and set up for  For LB ADLs due to current functional limitations. Pt will benefit from skilled acute OT to prepare for safe transition to  home setting. DC recommendation to surgeons recommendation, however, Home Health OT would be beneficial for home safety education for completion of ADLs. OT will continue to follow acutely.   Follow Up Recommendations  Supervision - Intermittent;Follow surgeon's recommendation for DC plan and follow-up therapies    Equipment Recommendations  Tub/shower bench;3 in 1 bedside commode    Recommendations for Other Services       Precautions / Restrictions Precautions Precautions: Fall Restrictions Weight Bearing Restrictions: Yes LLE Weight Bearing: Non weight bearing      Mobility Bed Mobility Overal bed mobility: Needs Assistance Bed Mobility: Supine to Sit     Supine to sit: Supervision;HOB elevated     General bed mobility comments: min gaurd for managment of L LE across bed onto floor at EOB.  Transfers Overall transfer level: Needs assistance Equipment used: Rolling walker (2 wheeled) Transfers: Sit to/from Stand Sit to Stand: Min assist         General transfer comment: min A for safety and steadying with RW. Pt  demonstrated UE strength for initial power up to stand,.     Balance Overall balance assessment: Needs assistance Sitting-balance support: Feet supported;No upper extremity supported Sitting balance-Leahy Scale: Fair     Standing balance support: Bilateral upper extremity supported Standing balance-Leahy Scale: Poor Standing balance comment: requires B UE and outside support to balance and maintain NWB L LE                           ADL either performed or assessed with clinical judgement   ADL Overall ADL's : Needs assistance/impaired Eating/Feeding: Independent;Sitting   Grooming: Wash/dry hands;Wash/dry face;Oral care;Set up;Sitting               Lower Body Dressing: Set up;Sitting/lateral leans   Toilet Transfer: Minimal assistance;RW;Cueing for safety;Cueing for sequencing Toilet Transfer Details (indicate cue type and reason): Simulated toilet transfer from bed to recliner. Min A for safety, VCs for sequencing and hand placment.          Functional mobility during ADLs: Minimal assistance;Rolling walker;Cueing for safety;Cueing for sequencing General ADL Comments: Pt requires assist with functional transfer due to limitation and NWB status.      Vision         Perception     Praxis      Pertinent Vitals/Pain Pain Assessment: 0-10 Pain Score: 6  Pain Location: L ankle Pain Descriptors / Indicators: Grimacing;Sore;Throbbing;Burning Pain Intervention(s): Monitored during session;Limited activity within patient's tolerance;Repositioned;Ice applied     Hand Dominance     Extremity/Trunk Assessment Upper Extremity Assessment Upper Extremity Assessment: Overall [facility]  for tasks assessed   Lower Extremity Assessment Lower Extremity Assessment: Defer to PT evaluation   Cervical / Trunk Assessment Cervical / Trunk Assessment: Normal   Communication Communication Communication: No difficulties   Cognition Arousal/Alertness: Lethargic Behavior  During Therapy: Flat affect;WFL for tasks assessed/performed Overall Cognitive Status: Within Functional Limits for tasks assessed                                 General Comments: increased time for processing   General Comments  Pt dressing with drainage over L knee, rest of splint and ACE wrap clean dry and in place. Physician came to adjust dressing while seated at EOB. VSS    Exercises     Shoulder Instructions      Home Living Family/patient expects to be discharged to:: Private residence Living Arrangements: Alone Available Help at Discharge: Friend(s) Type of Home: House Home Access: Stairs to enter Technical brewer of Steps: 5 Entrance Stairs-Rails: None Home Layout: One level     Bathroom Shower/Tub: Teacher, early years/pre: Standard     Home Equipment: None   Additional Comments: maybe able to go to girlfriends house with 2 steps to enter      Prior Functioning/Environment Level of Independence: Independent                 OT Problem List: Impaired balance (sitting and/or standing);Decreased safety awareness;Decreased knowledge of use of DME or AE;Decreased knowledge of precautions;Pain      OT Treatment/Interventions: Self-care/ADL training;Therapeutic exercise;DME and/or AE instruction;Patient/family education;Balance training    OT Goals(Current goals can be found in the care plan section) Acute Rehab OT Goals Patient Stated Goal: feed dogs OT Goal Formulation: With patient Time For Goal Achievement: 10/16/18 Potential to Achieve Goals: Good  OT Frequency: Min 2X/week   Barriers to D/C: Inaccessible home environment  Pt reports 5 steps to enter home.        Co-evaluation              AM-PAC OT "6 Clicks" Daily Activity     Outcome Measure Help from another person eating meals?: None Help from another person taking care of personal grooming?: A Little Help from another person toileting, which includes  using toliet, bedpan, or urinal?: None Help from another person bathing (including washing, rinsing, drying)?: A Little Help from another person to put on and taking off regular upper body clothing?: None Help from another person to put on and taking off regular lower body clothing?: A Little 6 Click Score: 21   End of Session Equipment Utilized During Treatment: Gait belt;Rolling walker Nurse Communication: Mobility status;Weight bearing status  Activity Tolerance: Patient tolerated treatment well(complains of burning sensation. ) Patient left: in chair;with call bell/phone within reach;with chair alarm set  OT Visit Diagnosis: Unsteadiness on feet (R26.81);History of falling (Z91.81);Pain Pain - Right/Left: Left Pain - part of body: Leg                Time: 8416-6063 OT Time Calculation (min): 29 min Charges:  OT General Charges $OT Visit: 1 Visit OT Evaluation $OT Eval Low Complexity: 1 Low OT Treatments $Self Care/Home Management : 8-22 mins  Minus Breeding, MSOT, OTR/L  Supplemental Rehabilitation Services  [phone]  Marius Ditch 10/02/2018, 10:02 AM

## 2018-10-02 NOTE — Progress Notes (Signed)
Physical Therapy Treatment Patient Details Name: [name] MRN: [id number] DOB: Apr 30, 1966 Today's Date: 10/02/2018    History of Present Illness 53 y.o. male with medical history significant for ST elevation MI and diabetes mellitus who presented to the ED with complaints of left ankle pain.  Patient  stepped on a log of wood awkwardly pain and deformity to his left lower extremity. X-ray of left lower extremity shows comminuted distal tibia-fibula fractures. s/p OPEN REDUCTION INTERNAL FIXATION LEFT PILON FRACTURE (Left), TIBIA AND FIBULA, and INTRAMEDULLARY NAILING OF THE LEFT TIBIA.     PT Comments    Pt is making good progress towards his goals however continues to be limited in safe mobility by L LE pain and generalized weakness with mobility while maintaining NWB. Pt is minA for bed mobility, min A for transfers and min A with chair follow for ambulation of 70 feet with RW. Pt will need stair training tomorrow prior to d/c home.     Follow Up Recommendations  Home health PT;Supervision/Assistance - 24 hour     Equipment Recommendations  Rolling walker with 5" wheels;3in1 (PT)    Recommendations for Other Services OT consult     Precautions / Restrictions Precautions Precautions: Fall Restrictions Weight Bearing Restrictions: Yes LLE Weight Bearing: Non weight bearing    Mobility  Bed Mobility Overal bed mobility: Needs Assistance Bed Mobility: Supine to Sit     Supine to sit: Min assist     General bed mobility comments: minA for managment of L LE across bed  Transfers Overall transfer level: Needs assistance Equipment used: Rolling walker (2 wheeled) Transfers: Sit to/from Stand Sit to Stand: Min assist;+2 physical assistance         General transfer comment: minAx2 for power up and steadying from bed, min Ax1 for powerup from toilet  Ambulation/Gait Ambulation/Gait assistance: Min assist;+2 safety/equipment Gait Distance (Feet): 70 Feet Assistive  device: Rolling walker (2 wheeled) Gait Pattern/deviations: Trunk flexed(hop to) Gait velocity: slowed Gait velocity interpretation: 1.31 - 2.62 ft/sec, indicative of limited community ambulator General Gait Details: minA for steadying with RW, maximal vc for slowing down and improving upright posture to maximize efficiency of UE power       Balance Overall balance assessment: Needs assistance Sitting-balance support: Feet supported;No upper extremity supported Sitting balance-Leahy Scale: Fair     Standing balance support: Bilateral upper extremity supported Standing balance-Leahy Scale: Poor Standing balance comment: requires B UE and outside support to balance and maintain NWB L LE                            Cognition Arousal/Alertness: Lethargic Behavior During Therapy: WFL for tasks assessed/performed Overall Cognitive Status: Within Functional Limits for tasks assessed                                           General Comments General comments (skin integrity, edema, etc.): Pt dressing has been loosened by RN due to increased pressure on L calf      Pertinent Vitals/Pain Pain Assessment: 0-10 Pain Score: 7  Pain Location: L ankle Pain Descriptors / Indicators: Grimacing;Sore;Throbbing;Burning Pain Intervention(s): Limited activity within patient's tolerance;Monitored during session;Repositioned    Home Living Family/patient expects to be discharged to:: Private residence Living Arrangements: Alone Available Help at Discharge: Friend(s) Type of Home: House Home Access: Stairs to  enter Entrance Stairs-Rails: None Home Layout: One level Home Equipment: None Additional Comments: maybe able to go to girlfriends house with 2 steps to enter    Prior Function Level of Independence: Independent          PT Goals (current goals can now be found in the care plan section) Acute Rehab PT Goals Patient Stated Goal: feed dogs PT Goal  Formulation: With patient Time For Goal Achievement: 10/15/18 Potential to Achieve Goals: Fair Progress towards PT goals: Progressing toward goals    Frequency    Min 5X/week      PT Plan Current plan remains appropriate       AM-PAC PT "6 Clicks" Mobility   Outcome Measure  Help needed turning from your back to your side while in a flat bed without using bedrails?: None Help needed moving from lying on your back to sitting on the side of a flat bed without using bedrails?: A Little Help needed moving to and from a bed to a chair (including a wheelchair)?: A Little Help needed standing up from a chair using your arms (e.g., wheelchair or bedside chair)?: A Little Help needed to walk in hospital room?: A Little Help needed climbing 3-5 steps with a railing? : Total 6 Click Score: 17    End of Session Equipment Utilized During Treatment: Gait belt Activity Tolerance: Patient limited by pain;Patient limited by lethargy Patient left: in chair;with call bell/phone within reach;with chair alarm set Nurse Communication: Mobility status;Precautions;Weight bearing status PT Visit Diagnosis: Unsteadiness on feet (R26.81);Other abnormalities of gait and mobility (R26.89);Muscle weakness (generalized) (M62.81);History of falling (Z91.81);Difficulty in walking, not elsewhere classified (R26.2);Pain Pain - Right/Left: Left Pain - part of body: Ankle and joints of foot     Time: 4287-6811 PT Time Calculation (min) (ACUTE ONLY): 20 min  Charges:  $Gait Training: 8-22 mins                     Nzinga Ferran B. Migdalia Dk PT, DPT Acute Rehabilitation Services Pager [phone] Office [phone]    [place] 10/02/2018, 5:26 PM

## 2018-10-02 NOTE — TOC Initial Note (Signed)
Transition of Care [facility]) - Initial/Assessment Note    Patient Details  Name: [name] MRN: [id number] Date of Birth: [birth year]  [facility] CM/SW Contact:    Ninfa Meeker, RN Phone Number: 10/02/2018, 3:06 PM . Clinical Narrative:   53 yr old male admitted with left tib/fib fracture after missing a step while splitting wood. Patient underwent Left ankle ORIF 3/22. Case manager spoke with patient concerning discharge plan and DME. Referral called to Joen Laura, [facility]. Patient will go home with his girlfriend, [name]. [place],   His cell is [phone].               Expected Discharge Plan: [place] Barriers to Discharge: No Barriers Identified   Patient Goals and CMS Choice     Choice offered to / list presented to : Patient  Expected Discharge Plan and Services Expected Discharge Plan: [place]   Discharge Planning Services: CM Consult Post Acute Care Choice: Durable Medical Equipment, Home Health Living arrangements for the past 2 months: Apartment Expected Discharge Date: 10/01/18               DME Arranged: 3-N-1, Walker rolling DME Agency: [employer]      Prior Living Arrangements/Services Living arrangements for the past 2 months: Apartment Lives with:: Self   Do you feel safe going back to the place where you live?: Yes      Need for Family Participation in Patient Care: No (Comment) Care giver support system in place?: Yes (comment)   Criminal Activity/Legal Involvement Pertinent to Current Situation/Hospitalization: No - Comment as needed  Activities of Daily Living Home Assistive Devices/Equipment: None ADL Screening (condition at time of admission) Patient's cognitive ability adequate to safely complete daily activities?: Yes Is the patient deaf or have difficulty hearing?: No Does the patient have difficulty seeing, even when wearing glasses/contacts?: No Does the  patient have difficulty concentrating, remembering, or making decisions?: No Patient able to express need for assistance with ADLs?: No Does the patient have difficulty dressing or bathing?: No Independently performs ADLs?: Yes (appropriate for developmental age) Does the patient have difficulty walking or climbing stairs?: Yes Weakness of Legs: Left Weakness of Arms/Hands: None  Permission Sought/Granted                  Emotional Assessment Appearance:: Appears stated age Attitude/Demeanor/Rapport: Gracious Affect (typically observed): Accepting, Adaptable Orientation: : Oriented to Self, Oriented to Place, Oriented to  Time, Oriented to Situation Alcohol / Substance Use: Not Applicable    Admission diagnosis:  Fracture [T14.8XXA] Pre-op chest exam [B01.751] Closed left ankle fracture [S82.892A] Patient Active Problem List   Diagnosis Date Noted  . Fracture of tibial shaft, left, closed 10/02/2018  . Nicotine dependence 10/02/2018  . Closed intra-articular fracture of distal end of left tibia   . Type 2 diabetes mellitus with hemoglobin A1c goal of less than 7.5% (HCC)   . Pre-op chest exam   . Closed fracture of left distal fibula 09/29/2018  . Angina, class II ([place]) - Low Risk ST, but persistent chest pain. 05/08/2017  . History of colonic polyps   . Diverticulosis of colon without hemorrhage   . ST-segment elevation myocardial infarction (STEMI) of inferior wall ([place]) 03/14/2014  . Presence of bare metal stent in right coronary artery: VeriFlex BMS 5.0 mm x 15 mm (5.6 mm) 03/14/2014    Class: Status post  . CAD S/P  percutaneous coronary angioplasty 03/14/2014  . Chest pain 03/13/2014  . Pancreatitis 01/19/2014  . Hyperlipidemia associated with type 2 diabetes mellitus ([place]) 01/19/2014  . Diabetes mellitus ([place]) 01/19/2014   PCP:  Sharilyn Sites, MD Pharmacy:   [facility], [address] AT [place] [place] [address] Phone: [phone] Fax: [phone]     Social Determinants of Health (SDOH) Interventions    Readmission Risk Interventions No flowsheet data found.

## 2018-10-02 NOTE — Progress Notes (Addendum)
Patient complaining of a stinging sensation on mid anterior part of his foot. Patient has been given ice, pain meds & foot is elevated. Seems to be helping patient. Does have some old drainage at knee and on his heel. Will continue to monitor.

## 2018-10-02 NOTE — Progress Notes (Signed)
PROGRESS NOTE    [name]  UVO:[id number] DOB: Sep 24, 1965 DOA: 09/29/2018 PCP: Sharilyn Sites, MD  Brief Narrative: 53 y.o. male with medical history significant for ST elevation MI and diabetes mellitus who presented to the ED with complaints of left ankle pain.  Patient was splitting wood when he stepped on a log of wood awkwardly pain and deformity to his left lower extremity.  EMS was called.  And splint was applied. Patient denies chest pain or difficulty breathing.  Patient denies any flulike symptoms.  No recent travel no sick contacts. ED Course: Blood pressure soft to stable 99-109, unremarkable CBC BMP.  X-ray of left lower extremity shows comminuted distal tibia-fibula fractures.  Dilaudid and morphine given in ED for pain control, and Ativan.  Orthopedist Dr. Aline Brochure was consulted in the ED, saw patient and did a close reduction in the ED, also talked to Dr. Marcelino Scot orthopedist on call at [facility] who agreed to consult on patient, and hospitalist to admit and transfer to [facility].    Assessment & Plan:   Principal Problem:   Closed intra-articular fracture of distal end of left tibia Active Problems:   Type 2 diabetes mellitus with hemoglobin A1c goal of less than 7.5% (HCC)   Closed left ankle fracture   Pre-op chest exam   Fracture of tibial shaft, left, closed   Nicotine dependence  Closed L ankle fracture: twisted his L leg in some downed tree branches out in his yard. In ED x-ray shows comminuted distal tibia-fibula fracture.  Closed reduction in the outside ED by Dr. Aline Brochure. Then transferred to [facility] for surgery.  - patient clear for surgery by cardiology; no further cardiac testing needed - underwent ORIF L pilon fracture, tibia and fibula 10/01/18 by Dr Marcelino Scot - per ortho notes pt doing well, prob dc home in 1-2 days   CAD- 2015 with inferior STEMI, received BMS to RCA. 04/2017 cath: patent coronaries, patent RCA stent.  Patient is active, no chest pain.  04/29/2017  shows EF by cardiac cath 50 to 55%. F/w Dr. Harl Bowie. No echo in file .however he is very active climb stairs cuts woods without any chest pain. - held ASA for surgery - have resumed home aspirin, Coreg, Lasix prn, Imdur, lisinopril, pravastatin after surgery  Diabetes mellitus-glucose 145- 195 -Holding metformin, resume after surgery POD 2 per ortho - will cont sensitive SSI and add 2u meal coverage, keep BS's <200 per ortho recs - pt not taking home metformin regularly due to nausea > will consult DM team for recommendations when pt leaves     DVT prophylaxis: lovenox per ortho Code Status: Full Family Communication: None at bedside Disposition: per ortho Consults called: Orthopedist Dr. Aline Brochure at [facility], Dr. Marcelino Scot at [facility] [phone] 10/02/2018, 12:17 PM    Subjective: doing better today, not as sleepy, pain is somewhat controlled. No new c/o's.  Objective: Vitals:   10/01/18 2350 10/02/18 0423 10/02/18 0508 10/02/18 0956  BP: 137/83 126/73 137/86 100/75  Pulse: 62 63 65 68  Resp: 14 14 16    Temp: 98.3 F (36.8 C) 98.1 F (36.7 C) 97.7 F (36.5 C) 98.3 F (36.8 C)  TempSrc: Oral Oral Oral Oral  SpO2: 93% 98% 97% 97%  Weight:      Height:        Intake/Output Summary (Last 24 hours) at 10/02/2018 1217 Last data filed at 10/02/2018 0500 Gross per 24 hour  Intake 2108.47 ml  Output 1750 ml  Net 358.47 ml   Filed Weights   09/29/18 1526  Weight: 99.8 kg    Examination:  General exam: Appears calm and comfortable  Respiratory system: Clear to auscultation. Respiratory effort normal. Cardiovascular system: S1 & S2 heard, RRR. No JVD, murmurs, rubs, gallops or clicks. No pedal edema. Gastrointestinal system: Abdomen is nondistended, soft and nontender. No organomegaly or masses felt. Normal bowel sounds heard. Central nervous system: Alert and oriented. No focal neurological deficits. Extremities:left foot in soft cast Skin: No  rashes, lesions or ulcers Psychiatry: Judgement and insight appear normal. Mood & affect appropriate.     Data Reviewed: I have personally reviewed following labs and imaging studies  CBC: Recent Labs  Lab 09/29/18 1622 10/01/18 1454 10/02/18 0412  WBC 11.4* 14.5* 16.1*  NEUTROABS 7.3  --   --   HGB 15.0 16.0 14.8  HCT 44.7 47.1 43.4  MCV 95.3 95.2 94.3  PLT 142* 139* 063*   Basic Metabolic Panel: Recent Labs  Lab 09/29/18 1622 10/01/18 1454 10/02/18 0412  NA 137  --  135  K 3.6  --  3.9  CL 102  --  98  CO2 24  --  28  GLUCOSE 165*  --  164*  BUN 22*  --  13  CREATININE 0.84 0.99 0.92  CALCIUM 9.3  --  8.8*   GFR: Estimated Creatinine Clearance: 115.4 mL/min (by C-G formula based on SCr of 0.92 mg/dL). Liver Function Tests: No results for input(s): AST, ALT, ALKPHOS, BILITOT, PROT, ALBUMIN in the last 168 hours. No results for input(s): LIPASE, AMYLASE in the last 168 hours. No results for input(s): AMMONIA in the last 168 hours. Coagulation Profile: No results for input(s): INR, PROTIME in the last 168 hours. Cardiac Enzymes: No results for input(s): CKTOTAL, CKMB, CKMBINDEX, TROPONINI in the last 168 hours. BNP (last 3 results) No results for input(s): PROBNP in the last 8760 hours. HbA1C: Recent Labs    10/02/18 0412  HGBA1C 8.0*   CBG: Recent Labs  Lab 10/01/18 2115 10/01/18 2354 10/02/18 0421 10/02/18 0958 10/02/18 1203  GLUCAP 171* 145* 165* 192* 156*   Lipid Profile: No results for input(s): CHOL, HDL, LDLCALC, TRIG, CHOLHDL, LDLDIRECT in the last 72 hours. Thyroid Function Tests: No results for input(s): TSH, T4TOTAL, FREET4, T3FREE, THYROIDAB in the last 72 hours. Anemia Panel: No results for input(s): VITAMINB12, FOLATE, FERRITIN, TIBC, IRON, RETICCTPCT in the last 72 hours. Sepsis Labs: No results for input(s): PROCALCITON, LATICACIDVEN in the last 168 hours.  Recent Results (from the past 240 hour(s))  MRSA PCR Screening      Status: None   Collection Time: 09/29/18 11:28 PM  Result Value Ref Range Status   MRSA by PCR NEGATIVE NEGATIVE Final    Comment:        The GeneXpert MRSA Assay (FDA approved for NASAL specimens only), is one component of a comprehensive MRSA colonization surveillance program. It is not intended to diagnose MRSA infection nor to guide or monitor treatment for MRSA infections. Performed at [facility], [place] [address], [place], [place] [address]          Radiology Studies: Dg Tibia/fibula Left  Result Date: 10/01/2018 CLINICAL DATA:  ORIF of left ankle fracture EXAM: LEFT TIBIA AND FIBULA - 2 VIEW; DG C-ARM 61-120 MIN COMPARISON:  09/29/2018 FLUOROSCOPY TIME:  Fluoroscopy Time:  1 minutes 55 seconds Radiation Exposure Index (if provided by the fluoroscopic device): 2.19 mGy Number of Acquired  Spot Images: 11 FINDINGS: Initial images demonstrate a fixation sideplate along the distal fibula. Fracture fragments are in near anatomic alignment. Medullary rod is subsequently placed within the tibia with proximal and distal fixation screws. Fixation screws are also noted in the distal aspect of the tibia. Fracture fragments are in near anatomic alignment. IMPRESSION: ORIF of distal tibial and fibular fractures. Electronically Signed   By: [name] M.D.   On: 10/01/2018 12:50   Dg Ankle Complete Left  Result Date: 10/01/2018 CLINICAL DATA:  Left lower leg ORIF EXAM: LEFT ANKLE COMPLETE - 3+ VIEW COMPARISON:  10/01/2018 FINDINGS: Comminuted distal tibial metaphyseal fracture transfixed with a intramedullary nail and multiple interlocking screws. 2 screws transfixing the posterior tibial malleolus. Comminuted fracture of the distal fibula transfixed with a lateral sideplate and multiple interlocking screws. Alignment of the fractures is near anatomic. Ankle mortise is intact. Postsurgical changes in the surrounding soft tissues. IMPRESSION: 1. Interval ORIF of the distal tibial and  fibular fractures. Electronically Signed   By: Kathreen Devoid   On: 10/01/2018 13:33   Ct Ankle Left Wo Contrast  Result Date: 09/30/2018 CLINICAL DATA:  The patient suffered a left ankle injury when he stepped on a log a wood or possibly into a hole 09/29/2018. Initial encounter. EXAM: CT OF THE LEFT ANKLE WITHOUT CONTRAST TECHNIQUE: Multidetector CT imaging of the left ankle was performed according to the standard protocol. Multiplanar CT image reconstructions were also generated. COMPARISON:  Plain films of the left lower leg and ankle 09/29/2018. FINDINGS: Bones/Joint/Cartilage As seen on the comparison plain films, the patient has a fracture of the distal tibia. The distal diaphysis and proximal metaphysis are mildly comminuted. The fracture originates in the posterior cortex of the tibia approximately 9 cm above the plafond with a fracture line exiting the anterior cortex of the tibia 4.5 cm above the plafond. The distal fracture fragment is posteriorly displaced up to 1 cm. Nondisplaced component of the fracture extends through the plafond where a fracture line is oriented from the anterior, medial cortex through the posterior plafond centrally. There is also a nondisplaced component of the fracture through the posterior malleolus. Patient also has a displaced and mildly comminuted fracture of the distal fibula. The fracture is oblique in orientation originating 7.5 cm above the tip of the lateral malleolus. The fracture extends anteriorly and inferiorly through the anterior cortex of the fibula approximately 2 cm above the tip of the lateral malleolus. The distal fragment demonstrates approximately 1 shaft with lateral displacement and up to 1.3 cm posterior displacement. Well corticated bone fragment off the tip of the lateral malleolus consistent with remote fracture. Small plantar calcaneal spur is incidentally noted. Ligaments Suboptimally assessed by CT. Muscles and Tendons Intact.  No evidence of  tendon entrapment. Soft tissues Soft tissue contusion about the patient's fractures noted. IMPRESSION: Mildly comminuted distal tibial fracture exits the anterior cortex of the metaphysis where there is up to 1 cm posterior displacement. Nondisplaced components of the fracture extend through the central plafond and posterior malleolus. No step-off at the articular surface. Displaced and mildly comminuted distal fibular fracture. Electronically Signed   By: Inge Rise M.D.   On: 09/30/2018 16:16   Dg Tibia/fibula Left Port  Result Date: 10/01/2018 CLINICAL DATA:  Status post ORIF EXAM: PORTABLE LEFT TIBIA AND FIBULA - 2 VIEW COMPARISON:  Intraoperative films from earlier in the same day. FINDINGS: Medullary rod is noted within the tibia with proximal and distal fixation screws. Fixation sideplate  along the distal fibula is seen. Splinting material is noted. No acute abnormality is noted. IMPRESSION: Status post ORIF of distal tibial and fibular fractures. Electronically Signed   By: [name] M.D.   On: 10/01/2018 13:30   Dg C-arm 1-60 Min  Result Date: 10/01/2018 CLINICAL DATA:  ORIF of left ankle fracture EXAM: LEFT TIBIA AND FIBULA - 2 VIEW; DG C-ARM 61-120 MIN COMPARISON:  09/29/2018 FLUOROSCOPY TIME:  Fluoroscopy Time:  1 minutes 55 seconds Radiation Exposure Index (if provided by the fluoroscopic device): 2.19 mGy Number of Acquired Spot Images: 11 FINDINGS: Initial images demonstrate a fixation sideplate along the distal fibula. Fracture fragments are in near anatomic alignment. Medullary rod is subsequently placed within the tibia with proximal and distal fixation screws. Fixation screws are also noted in the distal aspect of the tibia. Fracture fragments are in near anatomic alignment. IMPRESSION: ORIF of distal tibial and fibular fractures. Electronically Signed   By: [name] M.D.   On: 10/01/2018 12:50        Scheduled Meds:  acetaminophen  1,000 mg Oral Q8H   carvedilol   3.125 mg Oral BID WC   cholecalciferol  2,000 Units Oral BID   docusate sodium  100 mg Oral BID   enoxaparin (LOVENOX) injection  40 mg Subcutaneous Q24H   gabapentin  300 mg Oral BID   insulin aspart  0-9 Units Subcutaneous Q4H   isosorbide mononitrate  60 mg Oral Daily   methocarbamol  750 mg Oral TID   pantoprazole  40 mg Oral Daily   pravastatin  80 mg Oral QPM   vitamin C  500 mg Oral Daily   Continuous Infusions:  0.9 % NaCl with KCl 20 mEq / L 10 mL/hr at 10/02/18 1002   lactated ringers 10 mL/hr at 10/01/18 0850   methocarbamol (ROBAXIN) IV       LOS: 3 days    If 7PM-7AM, please contact night-coverage [email or web address] Password [facility] 10/02/2018, 12:17 PM

## 2018-10-02 NOTE — Progress Notes (Signed)
Orthopedic Trauma Service Progress Note  Patient ID: [name] MRN: [id number] DOB/AGE: 1965/09/28 54 y.o.  Subjective:  Doing fair this am Pain moderate Notes burning pain on the dorsum of his L foot  Working with OT at current time  Family states that pt smokes about 1-2 ppd but pt still states he only smokes about 1/2 pack per day   Pt also states that he does not take his metformin regular due to GI distress   + void + flatus    Review of Systems  Constitutional: Negative for chills and fever.  Respiratory: Negative for shortness of breath and wheezing.   Cardiovascular: Negative for chest pain and palpitations.  Gastrointestinal: Negative for nausea and vomiting.    Objective:   VITALS:   Vitals:   10/01/18 2047 10/01/18 2350 10/02/18 0423 10/02/18 0508  BP: 134/87 137/83 126/73 137/86  Pulse: 72 62 63 65  Resp: 18 14 14 16   Temp: 98.5 F (36.9 C) 98.3 F (36.8 C) 98.1 F (36.7 C) 97.7 F (36.5 C)  TempSrc: Oral Oral Oral Oral  SpO2: 98% 93% 98% 97%  Weight:      Height:        Estimated body mass index is 29.03 kg/m as calculated from the following:   Height as of this encounter: 6\' 1"  (1.854 m).   Weight as of this encounter: 99.8 kg.   Intake/Output      03/23 0701 - 03/24 0700 03/24 0701 - 03/25 0700   P.O. 440    I.V. (mL/kg) 1368.4 (13.7)    IV Piggyback 300.1    Total Intake(mL/kg) 2108.5 (21.1)    Urine (mL/kg/hr) 1750 (0.7)    Blood 50    Total Output 1800    Net +308.5           LABS  Results for orders placed or performed during the hospital encounter of 09/29/18 (from the past 24 hour(s))  Glucose, capillary     Status: Abnormal   Collection Time: 10/01/18  1:06 PM  Result Value Ref Range   Glucose-Capillary 222 (H) 70 - 99 mg/dL   Comment 1 Notify RN    Comment 2 Document in Chart   CBC     Status: Abnormal   Collection Time: 10/01/18  2:54 PM   Result Value Ref Range   WBC 14.5 (H) 4.0 - 10.5 K/uL   RBC 4.95 4.22 - 5.81 MIL/uL   Hemoglobin 16.0 13.0 - 17.0 g/dL   HCT 47.1 39.0 - 52.0 %   MCV 95.2 80.0 - 100.0 fL   MCH 32.3 26.0 - 34.0 pg   MCHC 34.0 30.0 - 36.0 g/dL   RDW 13.1 11.5 - 15.5 %   Platelets 139 (L) 150 - 400 K/uL   nRBC 0.0 0.0 - 0.2 %  Creatinine, serum     Status: None   Collection Time: 10/01/18  2:54 PM  Result Value Ref Range   Creatinine, Ser 0.99 0.61 - 1.24 mg/dL   GFR calc non Af Amer >60 >60 mL/min   GFR calc Af Amer >60 >60 mL/min  Glucose, capillary     Status: Abnormal   Collection Time: 10/01/18  4:08 PM  Result Value Ref Range   Glucose-Capillary 226 (H) 70 - 99 mg/dL  Glucose, capillary  Status: Abnormal   Collection Time: 10/01/18  9:15 PM  Result Value Ref Range   Glucose-Capillary 171 (H) 70 - 99 mg/dL  Glucose, capillary     Status: Abnormal   Collection Time: 10/01/18 11:54 PM  Result Value Ref Range   Glucose-Capillary 145 (H) 70 - 99 mg/dL  Basic metabolic panel     Status: Abnormal   Collection Time: 10/02/18  4:12 AM  Result Value Ref Range   Sodium 135 135 - 145 mmol/L   Potassium 3.9 3.5 - 5.1 mmol/L   Chloride 98 98 - 111 mmol/L   CO2 28 22 - 32 mmol/L   Glucose, Bld 164 (H) 70 - 99 mg/dL   BUN 13 6 - 20 mg/dL   Creatinine, Ser 0.92 0.61 - 1.24 mg/dL   Calcium 8.8 (L) 8.9 - 10.3 mg/dL   GFR calc non Af Amer >60 >60 mL/min   GFR calc Af Amer >60 >60 mL/min   Anion gap 9 5 - 15  Hemoglobin A1c     Status: Abnormal   Collection Time: 10/02/18  4:12 AM  Result Value Ref Range   Hgb A1c MFr Bld 8.0 (H) 4.8 - 5.6 %   Mean Plasma Glucose 182.9 mg/dL  CBC     Status: Abnormal   Collection Time: 10/02/18  4:12 AM  Result Value Ref Range   WBC 16.1 (H) 4.0 - 10.5 K/uL   RBC 4.60 4.22 - 5.81 MIL/uL   Hemoglobin 14.8 13.0 - 17.0 g/dL   HCT 43.4 39.0 - 52.0 %   MCV 94.3 80.0 - 100.0 fL   MCH 32.2 26.0 - 34.0 pg   MCHC 34.1 30.0 - 36.0 g/dL   RDW 13.1 11.5 - 15.5 %    Platelets 144 (L) 150 - 400 K/uL   nRBC 0.0 0.0 - 0.2 %  Glucose, capillary     Status: Abnormal   Collection Time: 10/02/18  4:21 AM  Result Value Ref Range   Glucose-Capillary 165 (H) 70 - 99 mg/dL     PHYSICAL EXAM:   Gen: sitting on EOB, NAD Lungs: breathing unlabored Cardiac: regular  Ext:       Left Lower extremity   Splint in place  Ace wrap bunched up behind knee, this was adjusted  Swelling well controlled distally   DPN, SPN, TN sensation grossly intact  EHL, FHL, lesser toe motor intact  Ext warm   + DP pulse  No pain with passive stretch   Assessment/Plan: 1 Day Post-Op   Principal Problem:   Closed intra-articular fracture of distal end of left tibia Active Problems:   Closed left ankle fracture   Pre-op chest exam   Type 2 diabetes mellitus with hemoglobin A1c goal of less than 7.5% (HCC)   Fracture of tibial shaft, left, closed   Nicotine dependence   Anti-infectives (From admission, onward)   Start     Dose/Rate Route Frequency Ordered Stop   10/01/18 1445  ceFAZolin (ANCEF) IVPB 2g/100 mL premix     2 g 200 mL/hr over 30 Minutes Intravenous Every 6 hours 10/01/18 1440 10/02/18 0241   10/01/18 0845  ceFAZolin (ANCEF) IVPB 2g/100 mL premix     2 g 200 mL/hr over 30 Minutes Intravenous  Once 10/01/18 0836 10/01/18 0957   10/01/18 0836  ceFAZolin (ANCEF) 2-4 GM/100ML-% IVPB    Note to Pharmacy:  Ernesta Amble   : cabinet override      10/01/18 0836 10/01/18 0927    .  POD/HD#: 26  53 year old white male low energy left tibial shaft and tibial plafond fracture, left fibula fracture.  Chronic opiate use for chronic back pain   -Low energy fall    -Left intra-articular distal tibia fracture with shaft extension, comminuted left fibular fracture s/p ORIF L fibular, percutaneous fixation L distal tibia and IMN L tibia                NWB L leg x 6-8 weeks  Unrestricted ROM L knee  Splint L leg x 2 weeks then convert to CAM boot to begin ROM  exercises   PT/OT   Ok to do quad sets, SLRs, SAQ, LAQs   Ice and elevate above heart for swelling and pain control     - Pain management:             scheduled tylenol   Oxy IR 5-15 mg po q4h prn pain   Robaxin 750 mg po q8h   Will add neurontin 300 mg po q12h    Will manage pain meds x 8 weeks then turn care back over to PCP   - ABL anemia/Hemodynamics             stable    - Medical issues              A1c is 8%  Pt is not consistent with medications due to side effects per his report  ? Alternative med for him     Need to have tight sugar control for pt to limit risk of complications   Ideally sugars should be <200   Uncontrolled blood sugars increase his risk for nonunion and deep infection and given the location of his injury could lead to amputation    - DVT/PE prophylaxis:             lovenox x 4 weeks    - ID:              Ancef periop    - Metabolic Bone Disease:             labs pending   Will start on vitamin d and vitamin c    - Activity:             NWB L leg   - FEN/GI prophylaxis/Foley/Lines:            CHO mod diet                - Impediments to fracture healing:             DM             Chronic opioid use             Nicotine dependence    - Dispo:             PT/OT  Hopeful pt will be ready for dc home tomorrow or Thursday      Weightbearing: NWB LLE Insicional and dressing care: Dressings left intact until follow-up Orthopedic device(s): None, Splint and walker/crutches Showering: ok to shower but keep splint dry  VTE prophylaxis: Lovenox 40mg  qd 4 weeks  Pain control: plan to dc home with percocet and robaxin Follow - up plan: 2 weeks Contact information:  [place] MD, Ainsley Spinner PA-C  Jari Pigg, PA-C [phone] (C) 10/02/2018, 9:02 AM  [facility] [place] [address] [phone] [name] (F)

## 2018-10-02 NOTE — Plan of Care (Signed)
  Problem: Education: Goal: Knowledge of General Education information will improve Description Including pain rating scale, medication(s)/side effects and non-pharmacologic comfort measures 10/02/2018 0304 by Verdia Kuba, RN Outcome: Progressing 10/02/2018 0302 by Verdia Kuba, RN Outcome: Progressing   Problem: Clinical Measurements: Goal: Will remain free from infection 10/02/2018 0304 by Verdia Kuba, RN Outcome: Progressing 10/02/2018 0302 by Verdia Kuba, RN Outcome: Progressing   Problem: Activity: Goal: Risk for activity intolerance will decrease 10/02/2018 0304 by Verdia Kuba, RN Outcome: Progressing 10/02/2018 0302 by Verdia Kuba, RN Outcome: Progressing   Problem: Pain Managment: Goal: General experience of comfort will improve 10/02/2018 0304 by Verdia Kuba, RN Outcome: Progressing 10/02/2018 0302 by Verdia Kuba, RN Outcome: Progressing   Problem: Safety: Goal: Ability to remain free from injury will improve 10/02/2018 0304 by Verdia Kuba, RN Outcome: Progressing 10/02/2018 0302 by Verdia Kuba, RN Outcome: Progressing   Problem: Skin Integrity: Goal: Risk for impaired skin integrity will decrease 10/02/2018 0304 by Verdia Kuba, RN Outcome: Progressing 10/02/2018 0302 by Verdia Kuba, RN Outcome: Progressing

## 2018-10-02 NOTE — Plan of Care (Signed)

## 2018-10-03 LAB — BASIC METABOLIC PANEL
Anion gap: 8 (ref 5–15)
BUN: 21 mg/dL — ABNORMAL HIGH (ref 6–20)
CALCIUM: 8.5 mg/dL — AB (ref 8.9–10.3)
CO2: 27 mmol/L (ref 22–32)
Chloride: 101 mmol/L (ref 98–111)
Creatinine, Ser: 0.89 mg/dL (ref 0.61–1.24)
GFR calc Af Amer: >60 mL/min (ref >60)
Glucose, Bld: 186 mg/dL — ABNORMAL HIGH (ref 70–99)
Potassium: 3.8 mmol/L (ref 3.5–5.1)
SODIUM: 136 mmol/L (ref 135–145)

## 2018-10-03 LAB — DRUG PROFILE, UR, 9 DRUGS (LABCORP)
Amphetamines, Urine: NEGATIVE ng/mL
BENZODIAZEPINE QUANT UR: NEGATIVE ng/mL
Barbiturate, Ur: NEGATIVE ng/mL
Cannabinoid Quant, Ur: NEGATIVE ng/mL
Cocaine (Metab.): NEGATIVE ng/mL
Methadone Screen, Urine: NEGATIVE ng/mL
Opiate Quant, Ur: POSITIVE ng/mL — AB
Phencyclidine, Ur: NEGATIVE ng/mL
Propoxyphene, Urine: NEGATIVE ng/mL

## 2018-10-03 LAB — GLUCOSE, CAPILLARY
Glucose-Capillary: 155 mg/dL — ABNORMAL HIGH (ref 70–99)
Glucose-Capillary: 136 mg/dL — ABNORMAL HIGH (ref 70–99)

## 2018-10-03 LAB — VITAMIN D 25 HYDROXY (VIT D DEFICIENCY, FRACTURES): Vit D, 25-Hydroxy: 23.4 ng/mL — ABNORMAL LOW (ref 30.0–100.0)

## 2018-10-03 LAB — CALCITRIOL (1,25 DI-OH VIT D): Vit D, 1,25-Dihydroxy: 85 pg/mL — ABNORMAL HIGH (ref 19.9–79.3)

## 2018-10-03 MED ORDER — ENOXAPARIN SODIUM 40 MG/0.4ML ~~LOC~~ SOLN: 40.0000 mg | SUBCUTANEOUS | 0 refills | Status: DC

## 2018-10-03 MED ORDER — GLIMEPIRIDE 2 MG PO TABS: 2.0000 mg | ORAL_TABLET | ORAL | 11 refills | Status: DC

## 2018-10-03 MED ORDER — METHOCARBAMOL 500 MG PO TABS: 500.0000 mg | ORAL_TABLET | Freq: Three times a day (TID) | ORAL | 0 refills | Status: DC | PRN

## 2018-10-03 MED ORDER — METFORMIN HCL 500 MG PO TABS: 500.0000 mg | ORAL_TABLET | Freq: Every day | ORAL | 5 refills | Status: DC

## 2018-10-03 MED ORDER — POLYETHYLENE GLYCOL 3350 17 G PO PACK: 17.0000 g | PACK | Freq: Every day | ORAL | 0 refills | Status: DC | PRN

## 2018-10-03 MED ORDER — VITAMIN D 25 MCG (1000 UNIT) PO TABS: 2000.0000 [IU] | ORAL_TABLET | Freq: Two times a day (BID) | ORAL | 0 refills | Status: DC

## 2018-10-03 MED ORDER — ASCORBIC ACID 500 MG PO TABS: 500.0000 mg | ORAL_TABLET | Freq: Every day | ORAL | 0 refills | Status: DC

## 2018-10-03 MED ORDER — DOCUSATE SODIUM 100 MG PO CAPS: 100.0000 mg | ORAL_CAPSULE | Freq: Two times a day (BID) | ORAL | 0 refills | Status: DC

## 2018-10-03 MED ORDER — OXYCODONE-ACETAMINOPHEN 7.5-325 MG PO TABS: 1.0000 | ORAL_TABLET | Freq: Four times a day (QID) | ORAL | 0 refills | Status: AC | PRN

## 2018-10-03 MED ORDER — ENOXAPARIN (LOVENOX) PATIENT EDUCATION KIT
PACK | Freq: Once | Status: AC
  Administered 2018-10-03: 10:00:00

## 2018-10-03 MED FILL — OXYCODON-ACETAMINOPHEN 7.5-: 7.5-325 | 7 days supply | Qty: 56 | Fill #0

## 2018-10-03 MED FILL — VITAMIN D3 1,000 UNIT TAB: 25 MCG | 15 days supply | Qty: 60 | Fill #0

## 2018-10-03 MED FILL — VITAMIN C 500 MG TABLET: 500 | 30 days supply | Qty: 30 | Fill #0

## 2018-10-03 MED FILL — ENOXAPARIN 40 MG/0.4 ML SYR: 40 | 30 days supply | Qty: 12 | Fill #0

## 2018-10-03 MED FILL — METHOCARBAMOL 500 MG TABLET: 500 | 10 days supply | Qty: 60 | Fill #0

## 2018-10-03 NOTE — Progress Notes (Signed)
Physical Therapy Treatment Patient Details Name: [name] MRN: [id number] DOB: Dec 04, 1965 Today's Date: 10/03/2018    History of Present Illness 53 y.o. male with medical history significant for ST elevation MI and diabetes mellitus who presented to the ED with complaints of left ankle pain.  Patient  stepped on a log of wood awkwardly pain and deformity to his left lower extremity. X-ray of left lower extremity shows comminuted distal tibia-fibula fractures. s/p OPEN REDUCTION INTERNAL FIXATION LEFT PILON FRACTURE (Left), TIBIA AND FIBULA, and INTRAMEDULLARY NAILING OF THE LEFT TIBIA.     PT Comments    Pt is making good progress towards his goals today, however continues to be limited by decreased safety awareness and pain. Pt educated on HEP and stair training today. Pt currently min guard for all mobility with RW and is ready for d/c home.   HEP Access Code: [email or web address]  URL: [email or web address]  Date: 10/03/2018  Prepared by: Leia Alf Fleet    Exercises Supine Quadricep Sets- 10 reps- 3 sets- 10 hold- 3x daily- 7x weekly  Supine Short Arc Quad- 10 reps- 3 sets- 3x daily- 7x weekly  Supine Straight Leg Raises- 10 reps- 3 sets- 3x daily- 7x weekly  Seated Long Arc Quad- 10 reps- 3 sets- 3x daily- 7x weekly      Follow Up Recommendations  Home health PT;Supervision/Assistance - 24 hour     Equipment Recommendations  Rolling walker with 5" wheels;3in1 (PT)    Recommendations for Other Services OT consult     Precautions / Restrictions Precautions Precautions: Fall Restrictions Weight Bearing Restrictions: Yes LLE Weight Bearing: Non weight bearing    Mobility  Bed Mobility Overal bed mobility: Needs Assistance Bed Mobility: Supine to Sit     Supine to sit: Min guard     General bed mobility comments: min guard for safety   Transfers Overall transfer level: Needs assistance Equipment used: Rolling walker (2 wheeled) Transfers: Sit to/from  Stand Sit to Stand: Min guard         General transfer comment: min guard for safety  Ambulation/Gait Ambulation/Gait assistance: Min guard Gait Distance (Feet): 70 Feet Assistive device: Rolling walker (2 wheeled) Gait Pattern/deviations: Trunk flexed(hop to) Gait velocity: slowed Gait velocity interpretation: 1.31 - 2.62 ft/sec, indicative of limited community ambulator General Gait Details: min guard for safety, vc for proximity to RW, and upright posture.    Stairs Stairs: Yes Stairs assistance: Min assist;Min guard Stair Management: No rails;Backwards;With walker Number of Stairs: 1(1x5) General stair comments: min A progressing to min guard for ascent/descent of 1 step with RW, vc for sequencing and power up       Balance Overall balance assessment: Needs assistance Sitting-balance support: Feet supported;No upper extremity supported Sitting balance-Leahy Scale: Normal     Standing balance support: Bilateral upper extremity supported Standing balance-Leahy Scale: Poor Standing balance comment: requires B UE and outside support to balance and maintain NWB L LE                            Cognition Arousal/Alertness: Lethargic Behavior During Therapy: WFL for tasks assessed/performed Overall Cognitive Status: Within Functional Limits for tasks assessed                                        Exercises General Exercises - Lower Extremity Quad Sets: AROM;Left;5 reps;Supine(10 sec  hold) Short Arc Quad: AROM;Left;10 reps;Supine Straight Leg Raises: AROM;Left;10 reps;Supine    General Comments General comments (skin integrity, edema, etc.): Pt educated on HEP and given hand out with access code      Pertinent Vitals/Pain Pain Assessment: 0-10 Pain Score: 5  Pain Location: L ankle Pain Descriptors / Indicators: Grimacing;Sore;Throbbing;Burning Pain Intervention(s): Limited activity within patient's tolerance;Monitored during  session;Repositioned           PT Goals (current goals can now be found in the care plan section) Acute Rehab PT Goals Patient Stated Goal: feed dogs PT Goal Formulation: With patient Time For Goal Achievement: 10/15/18 Potential to Achieve Goals: Fair Progress towards PT goals: Progressing toward goals    Frequency    Min 5X/week      PT Plan Current plan remains appropriate       AM-PAC PT "6 Clicks" Mobility   Outcome Measure  Help needed turning from your back to your side while in a flat bed without using bedrails?: None Help needed moving from lying on your back to sitting on the side of a flat bed without using bedrails?: None Help needed moving to and from a bed to a chair (including a wheelchair)?: None Help needed standing up from a chair using your arms (e.g., wheelchair or bedside chair)?: None Help needed to walk in hospital room?: None Help needed climbing 3-5 steps with a railing? : None 6 Click Score: 24    End of Session Equipment Utilized During Treatment: Gait belt Activity Tolerance: Patient tolerated treatment well Patient left: in chair;with call bell/phone within reach;with chair alarm set Nurse Communication: Mobility status;Precautions;Weight bearing status PT Visit Diagnosis: Unsteadiness on feet (R26.81);Other abnormalities of gait and mobility (R26.89);Muscle weakness (generalized) (M62.81);History of falling (Z91.81);Difficulty in walking, not elsewhere classified (R26.2);Pain Pain - Right/Left: Left Pain - part of body: Ankle and joints of foot     Time: 0911-0940 PT Time Calculation (min) (ACUTE ONLY): 29 min  Charges:  $Gait Training: 23-37 mins                     Ryelynn Guedea B. Migdalia Dk PT, DPT Acute Rehabilitation Services Pager [phone] Office [phone]    [place] 10/03/2018, 10:25 AM

## 2018-10-03 NOTE — Plan of Care (Signed)
  Problem: Education: Goal: Knowledge of General Education information will improve Description: Including pain rating scale, medication(s)/side effects and non-pharmacologic comfort measures Outcome: Progressing   Problem: Clinical Measurements: Goal: Will remain free from infection Outcome: Progressing   Problem: Clinical Measurements: Goal: Respiratory complications will improve Outcome: Progressing   Problem: Pain Managment: Goal: General experience of comfort will improve Outcome: Progressing   Problem: Safety: Goal: Ability to remain free from injury will improve Outcome: Progressing   Problem: Skin Integrity: Goal: Risk for impaired skin integrity will decrease Outcome: Progressing   

## 2018-10-03 NOTE — Progress Notes (Signed)
Orthopedic Trauma Service Progress Note  Patient ID: [name] MRN: [id number] DOB/AGE: 01-22-1966 53 y.o.  Subjective:  Doing well Pain controlled Ready to go home  No new issues    ROS As above  Objective:   VITALS:   Vitals:   10/02/18 1259 10/02/18 1749 10/02/18 2120 10/03/18 0330  BP: 116/70 115/76 113/73 116/78  Pulse: 62 65 62 62  Resp:   16 16  Temp: 98.1 F (36.7 C) 98.2 F (36.8 C) 98.6 F (37 C) 98.4 F (36.9 C)  TempSrc: Oral Oral Oral Oral  SpO2: 97% 94% 97% 93%  Weight:      Height:        Estimated body mass index is 29.03 kg/m as calculated from the following:   Height as of this encounter: 6\' 1"  (1.854 m).   Weight as of this encounter: 99.8 kg.   Intake/Output      03/24 0701 - 03/25 0700 03/25 0701 - 03/26 0700   P.O. 1380    I.V. (mL/kg)     IV Piggyback     Total Intake(mL/kg) 1380 (13.8)    Urine (mL/kg/hr)  400 (3.3)   Blood     Total Output  400   Net +1380 -400        Urine Occurrence 2 x      LABS  Results for orders placed or performed during the hospital encounter of 09/29/18 (from the past 24 hour(s))  Glucose, capillary     Status: Abnormal   Collection Time: 10/02/18  9:58 AM  Result Value Ref Range   Glucose-Capillary 192 (H) 70 - 99 mg/dL  Glucose, capillary     Status: Abnormal   Collection Time: 10/02/18 12:03 PM  Result Value Ref Range   Glucose-Capillary 156 (H) 70 - 99 mg/dL  Glucose, capillary     Status: Abnormal   Collection Time: 10/02/18  4:45 PM  Result Value Ref Range   Glucose-Capillary 224 (H) 70 - 99 mg/dL  Glucose, capillary     Status: Abnormal   Collection Time: 10/02/18  9:22 PM  Result Value Ref Range   Glucose-Capillary 170 (H) 70 - 99 mg/dL  Basic metabolic panel     Status: Abnormal   Collection Time: 10/03/18  3:44 AM  Result Value Ref Range   Sodium 136 135 - 145 mmol/L   Potassium 3.8 3.5 - 5.1 mmol/L   Chloride 101 98 - 111 mmol/L   CO2 27 22 - 32 mmol/L   Glucose, Bld 186 (H) 70 - 99 mg/dL   BUN 21 (H) 6 - 20 mg/dL   Creatinine, Ser 0.89 0.61 - 1.24 mg/dL   Calcium 8.5 (L) 8.9 - 10.3 mg/dL   GFR calc non Af Amer >60 >60 mL/min   GFR calc Af Amer >60 >60 mL/min   Anion gap 8 5 - 15  Glucose, capillary     Status: Abnormal   Collection Time: 10/03/18  7:49 AM  Result Value Ref Range   Glucose-Capillary 155 (H) 70 - 99 mg/dL     PHYSICAL EXAM:   Gen: in bed, NAD, appears well  Lungs: breathing unlabored Cardiac: regular  Ext:       Left Lower extremity              Splint  in place             dressing stable              Swelling well controlled distally              DPN, SPN, TN sensation grossly intact             EHL, FHL, lesser toe motor intact             Ext warm              + DP pulse     Assessment/Plan: 2 Days Post-Op   Principal Problem:   Closed intra-articular fracture of distal end of left tibia Active Problems:   Closed fracture of left distal fibula   Pre-op chest exam   Type 2 diabetes mellitus with hemoglobin A1c goal of less than 7.5% (HCC)   Fracture of tibial shaft, left, closed   Nicotine dependence   Anti-infectives (From admission, onward)   Start     Dose/Rate Route Frequency Ordered Stop   10/01/18 1445  ceFAZolin (ANCEF) IVPB 2g/100 mL premix     2 g 200 mL/hr over 30 Minutes Intravenous Every 6 hours 10/01/18 1440 10/02/18 0241   10/01/18 0845  ceFAZolin (ANCEF) IVPB 2g/100 mL premix     2 g 200 mL/hr over 30 Minutes Intravenous  Once 10/01/18 0836 10/01/18 0957   10/01/18 0836  ceFAZolin (ANCEF) 2-4 GM/100ML-% IVPB    Note to Pharmacy:  [name]   : cabinet override      10/01/18 0836 10/01/18 0927    .  POD/HD#: 25   53 year old white male low energy left tibial shaft and tibial plafond fracture, left fibula fracture.  Chronic opiate use for chronic back pain   -Low energy fall    -Left intra-articular distal tibia  fracture with shaft extension, comminuted left fibular fracture s/p ORIF L fibular, percutaneous fixation L distal tibia and IMN L tibia                NWB L leg x 6-8 weeks             Unrestricted ROM L knee             Splint L leg x 2 weeks then convert to CAM boot to begin ROM exercises              PT/OT                         Ok to do quad sets, SLRs, SAQ, LAQs               Ice and elevate above heart for swelling and pain control                - Pain management:            will dc home with percocet 7.5/325 1-2 po q6h prn pain   Robaxin 500 mg 1-2 po q8h prn spasms    - ABL anemia/Hemodynamics             stable    - Medical issues             per medical team   Discussed importance of good sugar control with patient.  Uncontrolled sugars could lead to complications such as infection or nonunion    - DVT/PE prophylaxis:  lovenox x 4 weeks    - ID:              Ancef periop completed    - Metabolic Bone Disease:             + vitamin d insufficiency    Supplement    - Activity:             NWB L leg   - FEN/GI prophylaxis/Foley/Lines:            CHO mod diet     - Impediments to fracture healing:             DM             Chronic opioid use             Nicotine dependence    - Dispo:             dc home today   Follow up with ortho in 10-14 days, needs to call for appointment       Thomas Pigg, PA-C [phone] (C) 10/03/2018, 8:12 AM  [facility] [place] [address] [phone] [name] (F)

## 2018-10-03 NOTE — Progress Notes (Signed)
Swat nurse went over discharge paper work with patient and Lovenox instructions with full understanding

## 2018-10-03 NOTE — Discharge Instructions (Signed)
Orthopaedic Trauma Service Discharge Instructions   General Discharge Instructions  WEIGHT BEARING STATUS:  Nonweightbearing Left leg  RANGE OF MOTION/ACTIVITY: activity as tolerated while maintaining weightbearing restrictions noted above.  Unrestricted range of motion of L knee. Do not leg Left knee rest in a flexed position. If not working on motion keep it full straight. Do this by placing pillows under the ankle   Wound Care: keep splint clean and dry. Ok to shower but cover splint with bag and try to make as waterproof as possible. If the splint gets saturated please call office. Do not remove splint/dressing, we will remove at first follow up appointment   DVT/PE prophylaxis: Lovenox 40 mg subcutaneous injection daily x 4 weeks to prevent blood clots   Diet: as you were eating previously.  Can use over the counter stool softeners and bowel preparations, such as Miralax, to help with bowel movements.  Narcotics can be constipating.  Be sure to drink plenty of fluids  PAIN MEDICATION USE AND EXPECTATIONS  You have likely been given narcotic medications to help control your pain.  After a traumatic event that results in an fracture (broken bone) with or without surgery, it is ok to use narcotic pain medications to help control one's pain.  We understand that everyone responds to pain differently and each individual patient will be evaluated on a regular basis for the continued need for narcotic medications. Ideally, narcotic medication use should last no more than 6-8 weeks (coinciding with fracture healing).   As a patient it is your responsibility as well to monitor narcotic medication use and report the amount and frequency you use these medications when you come to your office visit.   We would also advise that if you are using narcotic medications, you should take a dose prior to therapy to maximize you participation.  IF YOU ARE ON NARCOTIC MEDICATIONS IT IS NOT PERMISSIBLE TO  OPERATE A MOTOR VEHICLE (MOTORCYCLE/CAR/TRUCK/MOPED) OR HEAVY MACHINERY DO NOT MIX NARCOTICS WITH OTHER CNS (CENTRAL NERVOUS SYSTEM) DEPRESSANTS SUCH AS ALCOHOL   STOP SMOKING OR USING NICOTINE PRODUCTS!!!!  As discussed nicotine severely impairs your body's ability to heal surgical and traumatic wounds but also impairs bone healing.  Wounds and bone heal by forming microscopic blood vessels (angiogenesis) and nicotine is a vasoconstrictor (essentially, shrinks blood vessels).  Therefore, if vasoconstriction occurs to these microscopic blood vessels they essentially disappear and are unable to deliver necessary nutrients to the healing tissue.  This is one modifiable factor that you can do to dramatically increase your chances of healing your injury.    (This means no smoking, no nicotine gum, patches, etc)  DO NOT USE NONSTEROIDAL ANTI-INFLAMMATORY DRUGS (NSAID'S)  Using products such as Advil (ibuprofen), Aleve (naproxen), Motrin (ibuprofen) for additional pain control during fracture healing can delay and/or prevent the healing response.  If you would like to take over the counter (OTC) medication, Tylenol (acetaminophen) is ok.  However, some narcotic medications that are given for pain control contain acetaminophen as well. Therefore, you should not exceed more than 4000 mg of tylenol in a day if you do not have liver disease.  Also note that there are may OTC medicines, such as cold medicines and allergy medicines that my contain tylenol as well.  If you have any questions about medications and/or interactions please ask your doctor/PA or your pharmacist.      ICE AND ELEVATE INJURED/OPERATIVE EXTREMITY  Using ice and elevating the injured extremity above your heart  can help with swelling and pain control.  Icing in a pulsatile fashion, such as 20 minutes on and 20 minutes off, can be followed.    Do not place ice directly on skin. Make sure there is a barrier between to skin and the ice pack.      Using frozen items such as frozen peas works well as the conform nicely to the are that needs to be iced.  USE AN ACE WRAP OR TED HOSE FOR SWELLING CONTROL  In addition to icing and elevation, Ace wraps or TED hose are used to help limit and resolve swelling.  It is recommended to use Ace wraps or TED hose until you are informed to stop.    When using Ace Wraps start the wrapping distally (farthest away from the body) and wrap proximally (closer to the body)   Example: If you had surgery on your leg or thing and you do not have a splint on, start the ace wrap at the toes and work your way up to the thigh        If you had surgery on your upper extremity and do not have a splint on, start the ace wrap at your fingers and work your way up to the upper arm  IF YOU ARE IN A SPLINT OR CAST DO NOT [place]   If your splint gets wet for any reason please contact the office immediately. You may shower in your splint or cast as long as you keep it dry.  This can be done by wrapping in a cast cover or garbage back (or similar)  Do Not stick any thing down your splint or cast such as pencils, money, or hangers to try and scratch yourself with.  If you feel itchy take benadryl as prescribed on the bottle for itching  IF YOU ARE IN A CAM BOOT (BLACK BOOT)  You may remove boot periodically. Perform daily dressing changes as noted below.  Wash the liner of the boot regularly and wear a sock when wearing the boot. It is recommended that you sleep in the boot until told otherwise  CALL THE OFFICE WITH ANY QUESTIONS OR CONCERNS: [phone]   Cast or Splint Care, Adult Casts and splints are supports that are worn to protect broken bones and other injuries. A cast or splint may hold a bone still and in the correct position while it heals. Casts and splints may also help to ease pain, swelling, and muscle spasms. How to care for your cast   Do not stick anything inside the cast to scratch your  skin.  Check the skin around the cast every day. Tell your doctor about any concerns.  You may put lotion on dry skin around the edges of the cast. Do not put lotion on the skin under the cast.  Keep the cast clean.  If the cast is not waterproof: ? Do not let it get wet. ? Cover it with a watertight covering when you take a bath or a shower. How to care for your splint   Wear it as told by your doctor. Take it off only as told by your doctor.  Loosen the splint if your fingers or toes tingle, get numb, or turn cold and blue.  Keep the splint clean.  If the splint is not waterproof: ? Do not let it get wet. ? Cover it with a watertight covering when you take a bath or a shower. Follow these  instructions at home: Bathing  Do not take baths or swim until your doctor says it is okay. Ask your doctor if you can take showers. You may only be allowed to take sponge baths for bathing.  If your cast or splint is not waterproof, cover it with a watertight covering when you take a bath or shower. Managing pain, stiffness, and swelling  Move your fingers or toes often to avoid stiffness and to lessen swelling.  Raise (elevate) the injured area above the level of your heart while sitting or lying down. Safety  Do not use the injured limb to support your body weight until your doctor says that it is okay.  Use crutches or other assistive devices as told by your doctor. General instructions  Do not put pressure on any part of the cast or splint until it is fully hardened. This may take many hours.  Return to your normal activities as told by your doctor. Ask your doctor what activities are safe for you.  Keep all follow-up visits as told by your doctor. This is important. Contact a doctor if:  Your cast or splint gets damaged.  The skin around the cast gets red or raw.  The skin under the cast is very itchy or painful.  Your cast or splint feels very uncomfortable.  Your  cast or splint is too tight or too loose.  Your cast becomes wet or it starts to have a soft spot or area.  You get an object stuck under your cast. Get help right away if:  Your pain gets worse.  The injured area tingles, gets numb, or turns blue and cold.  The part of your body above or below the cast is swollen and it turns a different color (is discolored).  You cannot feel or move your fingers or toes.  There is fluid leaking through the cast.  You have very bad pain or pressure under the cast.  You have trouble breathing.  You have shortness of breath.  You have chest pain. This information is not intended to replace advice given to you by your health care provider. Make sure you discuss any questions you have with your health care provider. Document Released: 10/27/2010 Document Revised: 06/17/2016 Document Reviewed: 06/17/2016 Elsevier Interactive Patient Education  2019 [employer].

## 2018-10-03 NOTE — Progress Notes (Signed)
Occupational Therapy Treatment Patient Details Name: [name] MRN: [id number] DOB: 01-Nov-1965 Today's Date: 10/03/2018    History of present illness 53 y.o. male with medical history significant for ST elevation MI and diabetes mellitus who presented to the ED with complaints of left ankle pain.  Patient  stepped on a log of wood awkwardly pain and deformity to his left lower extremity. X-ray of left lower extremity shows comminuted distal tibia-fibula fractures. s/p OPEN REDUCTION INTERNAL FIXATION LEFT PILON FRACTURE (Left), TIBIA AND FIBULA, and INTRAMEDULLARY NAILING OF THE LEFT TIBIA.    OT comments  Pt is at adequate level for d/c home at this time. Pt is dressed and provided wallet out of personal belongings during session. Pt educated on bathing and dressing precautions. All education is complete and patient indicates understanding.    Follow Up Recommendations  No OT follow up    Equipment Recommendations  Tub/shower bench;3 in 1 bedside commode    Recommendations for Other Services      Precautions / Restrictions Precautions Precautions: Fall Restrictions LLE Weight Bearing: Non weight bearing       Mobility Bed Mobility Overal bed mobility: Independent             General bed mobility comments: pt able to progress to EOb for dressing  Transfers Overall transfer level: Needs assistance Equipment used: Rolling walker (2 wheeled) Transfers: Sit to/from Stand Sit to Stand: Supervision              Balance     Sitting balance-Leahy Scale: Normal       Standing balance-Leahy Scale: Good                             ADL either performed or assessed with clinical judgement   ADL Overall ADL's : Needs assistance/impaired                     Lower Body Dressing: Minimal assistance;Sit to/from stand Lower Body Dressing Details (indicate cue type and reason): pt requires educated on dressing L LE first and threading over L foot.  pt abl e to complete transfer and sustain balance. pt requires hospital scrubs due to lack of clothing present at this time.  Toilet Transfer: Supervision/safety             General ADL Comments: pt asking about crutches during session and advised to remain with RW use at this time     Vision       Perception     Praxis      Cognition Arousal/Alertness: Awake/alert Behavior During Therapy: WFL for tasks assessed/performed Overall Cognitive Status: Within Functional Limits for tasks assessed                                          Exercises     Shoulder Instructions       General Comments educated on clean linen use for bathing/ educated on blanket or towel for dog being in patients lap. Advised on having dog up in the home prior to entry to prevent falls and then releasing the dog into the home after seated.     Pertinent Vitals/ Pain       Pain Assessment: No/denies pain  Home Living  Prior Functioning/Environment              Frequency  Min 2X/week        Progress Toward Goals  OT Goals(current goals can now be found in the care plan section)  Progress towards OT goals: Progressing toward goals  Acute Rehab OT Goals Patient Stated Goal: to go home to girlfriends today OT Goal Formulation: With patient Time For Goal Achievement: 10/16/18 Potential to Achieve Goals: Good ADL Goals Pt Will Perform Lower Body Bathing: Independently;sitting/lateral leans Pt Will Perform Lower Body Dressing: Independently;sitting/lateral leans Pt Will Transfer to Toilet: with modified independence;bedside commode;ambulating Pt Will Perform Tub/Shower Transfer: Tub transfer;tub bench;rolling walker;ambulating;grab bars  Plan Discharge plan remains appropriate    Co-evaluation                 AM-PAC OT "6 Clicks" Daily Activity     Outcome Measure   Help from another person eating  meals?: None Help from another person taking care of personal grooming?: None Help from another person toileting, which includes using toliet, bedpan, or urinal?: None Help from another person bathing (including washing, rinsing, drying)?: A Little Help from another person to put on and taking off regular upper body clothing?: None Help from another person to put on and taking off regular lower body clothing?: A Little 6 Click Score: 22    End of Session Equipment Utilized During Treatment: Rolling walker  OT Visit Diagnosis: Unsteadiness on feet (R26.81);History of falling (Z91.81);Pain Pain - Right/Left: Left Pain - part of body: Leg   Activity Tolerance Patient tolerated treatment well   Patient Left in bed;with call bell/phone within reach   Nurse Communication Mobility status;Precautions        Time: 9563-8756 OT Time Calculation (min): 21 min  Charges: OT General Charges $OT Visit: 1 Visit OT Treatments $Self Care/Home Management : 8-22 mins   Jeri Modena, OTR/L  Acute Rehabilitation Services Pager: [phone] Office: [phone] .    Jeri Modena 10/03/2018, 9:09 AM

## 2018-10-03 NOTE — Discharge Summary (Signed)
Physician Discharge Summary   Patient ID: [name] MRN: [id number] DOB/AGE: 1966-05-21 53 y.o.  Admit date: 09/29/2018 Discharge date: 10/03/2018  Primary Care Physician:  Sharilyn Sites, MD   Recommendations for Outpatient Follow-up:  Per orthopedics:  NWB L leg x 6-8 weeks Unrestricted ROM L knee Splint L leg x 2 weeks then convert to CAM boot to begin ROM exercises  PT/OT Ok to do quad sets, SLRs, SAQ, LAQs  Ice and elevate above heart for swelling and pain control   Home Health: Home health PT OT, walker Equipment/Devices:   Discharge Condition: stable  CODE STATUS: FULL Diet recommendation: Carb modified diet   Discharge Diagnoses:    . Closed intra-articular fracture of distal end of left tibia . Status post ORIF on 3/23 . Nicotine dependence History of coronary disease Diabetes mellitus type 2, NIIDM  Consults: Orthopedics, Dr. Marcelino Scot    Allergies:  No Known Allergies   DISCHARGE MEDICATIONS: Allergies as of 10/03/2018   No Known Allergies     Medication List    STOP taking these medications   oxyCODONE-acetaminophen 5-325 MG tablet Commonly known as:  Percocet Replaced by:  oxyCODONE-acetaminophen 7.5-325 MG tablet     TAKE these medications   ascorbic acid 500 MG tablet Commonly known as:  VITAMIN C Take 1 tablet (500 mg total) by mouth daily.   aspirin 81 MG chewable tablet Chew 1 tablet (81 mg total) by mouth daily.   carvedilol 3.125 MG tablet Commonly known as:  COREG TAKE 1 TABLET(3.125 MG) BY MOUTH TWICE DAILY WITH A MEAL What changed:  See the new instructions.   cholecalciferol 25 MCG (1000 UT) tablet Commonly known as:  VITAMIN D3 Take 2 tablets (2,000 Units total) by mouth 2 (two) times daily.   docusate sodium 100 MG capsule Commonly known as:  COLACE Take 1 capsule (100 mg total) by mouth 2 (two) times daily. FOR CONSTIPATION   enoxaparin 40  MG/0.4ML injection Commonly known as:  LOVENOX Inject 0.4 mLs (40 mg total) into the skin daily.   furosemide 20 MG tablet Commonly known as:  LASIX TAKE 1 TABLET BY MOUTH EVERY DAY AS NEEDED FOR SWELLING What changed:    how much to take  how to take this  when to take this  reasons to take this  additional instructions   glimepiride 2 MG tablet Commonly known as:  Amaryl Take 1 tablet (2 mg total) by mouth every morning.   ibuprofen 200 MG tablet Commonly known as:  ADVIL,MOTRIN Take 800 mg by mouth every 6 (six) hours as needed for fever, headache or mild pain.   isosorbide mononitrate 60 MG 24 hr tablet Commonly known as:  IMDUR Take 1 tablet (60 mg total) by mouth daily.   lisinopril 2.5 MG tablet Commonly known as:  PRINIVIL,ZESTRIL TAKE 1 TABLET(2.5 MG) BY MOUTH DAILY What changed:  See the new instructions.   metFORMIN 500 MG tablet Commonly known as:  GLUCOPHAGE Take 1 tablet (500 mg total) by mouth daily with breakfast. What changed:  when to take this   methocarbamol 500 MG tablet Commonly known as:  Robaxin Take 1-2 tablets (500-1,000 mg total) by mouth every 8 (eight) hours as needed for muscle spasms.   nitroGLYCERIN 0.4 MG SL tablet Commonly known as:  NITROSTAT Place 1 tablet (0.4 mg total) under the tongue every 5 (five) minutes as needed for chest pain.   oxyCODONE-acetaminophen 7.5-325 MG tablet Commonly known as:  Percocet Take 1-2 tablets by mouth  every 6 (six) hours as needed for moderate pain or severe pain. Replaces:  oxyCODONE-acetaminophen 5-325 MG tablet   polyethylene glycol packet Commonly known as:  MIRALAX / GLYCOLAX Take 17 g by mouth daily as needed for moderate constipation or severe constipation.   pravastatin 80 MG tablet Commonly known as:  PRAVACHOL Take 1 tablet (80 mg total) by mouth every evening.            Durable Medical Equipment  (From admission, onward)         Start     Ordered   10/03/18 0908   For home use only DME Walker rolling  Once    Comments:  5 inches wheels  Question:  Patient needs a walker to treat with the following condition  Answer:  Closed left ankle fracture   10/03/18 0908   10/02/18 1412  For home use only DME 3 n 1  Once     10/02/18 1412   10/02/18 1412  For home use only DME Walker rolling  Once    Question:  Patient needs a walker to treat with the following condition  Answer:  Ankle fracture, left   10/02/18 1412           Brief H and P: For complete details please refer to admission H and P, but in brief 53 y.o.malewith medical history significant forST elevation MI and diabetes mellitus who presented to the ED with complaints of left ankle pain. Patient was splitting wood when he stepped on a log of wood awkwardly pain and deformity to his left lower extremity. EMS was called. And splint was applied. Patient denies chest pain or difficulty breathing. Patient denies any flulike symptoms. No recent travel no sick contacts. ED Course:Blood pressure soft to stable 99-109, unremarkable CBC BMP. X-ray of left lower extremity shows comminuted distal tibia-fibula fractures. Dilaudid and morphine given in ED for pain control, andAtivan. Orthopedist Dr. Aline Brochure was consulted in the ED, sawpatientand did a close reduction in the ED,also talked to Dr. Burt Knack on call at [facility] who agreed to consult on patient, and hospitalist to admit and transfer to [facility].  Hospital Course:   Closed L ankle fracture:  -Patient twisted his left leg when he stepped on a log of wood awkwardly in his yard.   -In ED x-ray showed a comminuted distal tibia-fibula fracture.  Closed reduction was done in the ED at [facility].  Orthopedics was consulted and .  Patient was transferred for further work-up. -Patient underwent open reduction and internal fixation left pilon fracture, tibia and fibula, IM nailing of left tibia on 3/23 -PT OT recommended  home health PT, per instructions by orthopedics  CAD-2015 with inferior STEMI, received BMS to RCA.04/2017 cath: patent coronaries, patent RCA stent.Patient is active, no chest pain.04/29/2017 shows EF by cardiac cath50 to 55%.F/wDr. Harl Bowie. No echo in file, however he is very active climb stairs cuts woods without any chest pain. -Resumedaspirin, Coreg, Lasix prn, Imdur, lisinopril,pravastatin after surgery  Diabetes mellitus, type II, NIDDM -Metformin was held for the surgery Patient patient reported that he was not taking metformin as it caused GI upset.  Metformin was decreased to 500 mg daily and Amaryl added 2 mg daily.  Recommended to follow-up with his PCP.   Day of Discharge S: Feeling better, hoping to go home today.  Pain controlled.  Ambulating in the hallway with physical therapy.  BP 116/78 (BP Location: Left Arm)   Pulse 62   Temp  98.4 F (36.9 C) (Oral)   Resp 16   Ht 6\' 1"  (1.854 m)   Wt 99.8 kg   SpO2 93%   BMI 29.03 kg/m   Physical Exam: General: Alert and awake oriented x3 not in any acute distress. HEENT: anicteric sclera, pupils reactive to light and accommodation CVS: S1-S2 clear no murmur rubs or gallops Chest: clear to auscultation bilaterally, no wheezing rales or rhonchi Abdomen: soft nontender, nondistended, normal bowel sounds Extremities: no cyanosis, clubbing or edema noted bilaterally Neuro: Cranial nerves II-XII intact, no focal neurological deficits   The results of significant diagnostics from this hospitalization (including imaging, microbiology, ancillary and laboratory) are listed below for reference.      Procedures/Studies:  Dg Tibia/fibula Left  Result Date: 10/01/2018 CLINICAL DATA:  ORIF of left ankle fracture EXAM: LEFT TIBIA AND FIBULA - 2 VIEW; DG C-ARM 61-120 MIN COMPARISON:  09/29/2018 FLUOROSCOPY TIME:  Fluoroscopy Time:  1 minutes 55 seconds Radiation Exposure Index (if provided by the fluoroscopic device): 2.19  mGy Number of Acquired Spot Images: 11 FINDINGS: Initial images demonstrate a fixation sideplate along the distal fibula. Fracture fragments are in near anatomic alignment. Medullary rod is subsequently placed within the tibia with proximal and distal fixation screws. Fixation screws are also noted in the distal aspect of the tibia. Fracture fragments are in near anatomic alignment. IMPRESSION: ORIF of distal tibial and fibular fractures. Electronically Signed   By: [name] M.D.   On: 10/01/2018 12:50   Dg Tibia/fibula Left  Result Date: 09/29/2018 CLINICAL DATA:  Post reduction. EXAM: LEFT TIBIA AND FIBULA - 2 VIEW COMPARISON:  None. FINDINGS: Improved alignment at the distal tibia fracture site. No significant change seen at the distal fibular fracture site. Ankle mortise is symmetric. Proximal tibia and fibula appear intact and normally aligned. IMPRESSION: Improved alignment at the distal tibia fracture site status post reduction. Proximal tibia and fibula appear intact and normally aligned. Electronically Signed   By: Franki Cabot M.D.   On: 09/29/2018 17:48   Dg Tibia/fibula Left  Result Date: 09/29/2018 CLINICAL DATA:  Recent fall with lower leg deformity EXAM: LEFT TIBIA AND FIBULA - 2 VIEW COMPARISON:  None. FINDINGS: Comminuted distal tibial and fibular fractures are seen. There appears to be a rotational component to the fractures with the foot rotated laterally. The exam is somewhat limited due to the patient's positioning. IMPRESSION: Distal tibial and fibular fractures. Electronically Signed   By: [name] M.D.   On: 09/29/2018 16:31   Dg Ankle 2 Views Left  Result Date: 09/29/2018 CLINICAL DATA:  Post reduction, LEFT tib fib. EXAM: LEFT ANKLE - 2 VIEW COMPARISON:  Plain film of the LEFT ankle from earlier same day. FINDINGS: Interval casting. Alignment at the distal tibia fracture site appears improved status post reduction and casting. Ankle mortise appears symmetric. Likely  stable fracture displacement of the distal fibula. IMPRESSION: Improved alignment at the distal tibia fracture site status post reduction and casting. Electronically Signed   By: Franki Cabot M.D.   On: 09/29/2018 17:47   Dg Ankle Complete Left  Result Date: 10/01/2018 CLINICAL DATA:  Left lower leg ORIF EXAM: LEFT ANKLE COMPLETE - 3+ VIEW COMPARISON:  10/01/2018 FINDINGS: Comminuted distal tibial metaphyseal fracture transfixed with a intramedullary nail and multiple interlocking screws. 2 screws transfixing the posterior tibial malleolus. Comminuted fracture of the distal fibula transfixed with a lateral sideplate and multiple interlocking screws. Alignment of the fractures is near anatomic. Ankle mortise is intact.  Postsurgical changes in the surrounding soft tissues. IMPRESSION: 1. Interval ORIF of the distal tibial and fibular fractures. Electronically Signed   By: Kathreen Devoid   On: 10/01/2018 13:33   Dg Ankle Complete Left  Result Date: 09/29/2018 CLINICAL DATA:  Recent fall with ankle pain, initial encounter EXAM: LEFT ANKLE COMPLETE - 3+ VIEW COMPARISON:  None. FINDINGS: Comminuted distal tibial and fibular fractures are seen. Some lateral rotation is suggested of the foot with respect to the more proximal tibial and fibular shafts. Mild soft tissue swelling is noted. IMPRESSION: Comminuted distal tibial and fibular fractures. Some rotation of the foot laterally is suggested. Electronically Signed   By: [name] M.D.   On: 09/29/2018 16:32   Ct Ankle Left Wo Contrast  Result Date: 09/30/2018 CLINICAL DATA:  The patient suffered a left ankle injury when he stepped on a log a wood or possibly into a hole 09/29/2018. Initial encounter. EXAM: CT OF THE LEFT ANKLE WITHOUT CONTRAST TECHNIQUE: Multidetector CT imaging of the left ankle was performed according to the standard protocol. Multiplanar CT image reconstructions were also generated. COMPARISON:  Plain films of the left lower leg and ankle  09/29/2018. FINDINGS: Bones/Joint/Cartilage As seen on the comparison plain films, the patient has a fracture of the distal tibia. The distal diaphysis and proximal metaphysis are mildly comminuted. The fracture originates in the posterior cortex of the tibia approximately 9 cm above the plafond with a fracture line exiting the anterior cortex of the tibia 4.5 cm above the plafond. The distal fracture fragment is posteriorly displaced up to 1 cm. Nondisplaced component of the fracture extends through the plafond where a fracture line is oriented from the anterior, medial cortex through the posterior plafond centrally. There is also a nondisplaced component of the fracture through the posterior malleolus. Patient also has a displaced and mildly comminuted fracture of the distal fibula. The fracture is oblique in orientation originating 7.5 cm above the tip of the lateral malleolus. The fracture extends anteriorly and inferiorly through the anterior cortex of the fibula approximately 2 cm above the tip of the lateral malleolus. The distal fragment demonstrates approximately 1 shaft with lateral displacement and up to 1.3 cm posterior displacement. Well corticated bone fragment off the tip of the lateral malleolus consistent with remote fracture. Small plantar calcaneal spur is incidentally noted. Ligaments Suboptimally assessed by CT. Muscles and Tendons Intact.  No evidence of tendon entrapment. Soft tissues Soft tissue contusion about the patient's fractures noted. IMPRESSION: Mildly comminuted distal tibial fracture exits the anterior cortex of the metaphysis where there is up to 1 cm posterior displacement. Nondisplaced components of the fracture extend through the central plafond and posterior malleolus. No step-off at the articular surface. Displaced and mildly comminuted distal fibular fracture. Electronically Signed   By: Inge Rise M.D.   On: 09/30/2018 16:16   Dg Chest Port 1 View  Result Date:  09/29/2018 CLINICAL DATA:  Preoperative evaluation, LEFT ankle fracture EXAM: PORTABLE CHEST 1 VIEW COMPARISON:  Portable exam 1804 hours compared to 03/10/2018 FINDINGS: Upper normal heart size. Mediastinal contours and pulmonary vascularity normal. Lungs clear. No infiltrate, pleural effusion or pneumothorax. Bones unremarkable. IMPRESSION: No acute abnormalities. Electronically Signed   By: Lavonia Dana M.D.   On: 09/29/2018 18:55   Dg Tibia/fibula Left Port  Result Date: 10/01/2018 CLINICAL DATA:  Status post ORIF EXAM: PORTABLE LEFT TIBIA AND FIBULA - 2 VIEW COMPARISON:  Intraoperative films from earlier in the same day. FINDINGS: Medullary rod  is noted within the tibia with proximal and distal fixation screws. Fixation sideplate along the distal fibula is seen. Splinting material is noted. No acute abnormality is noted. IMPRESSION: Status post ORIF of distal tibial and fibular fractures. Electronically Signed   By: [name] M.D.   On: 10/01/2018 13:30   Dg C-arm 1-60 Min  Result Date: 10/01/2018 CLINICAL DATA:  ORIF of left ankle fracture EXAM: LEFT TIBIA AND FIBULA - 2 VIEW; DG C-ARM 61-120 MIN COMPARISON:  09/29/2018 FLUOROSCOPY TIME:  Fluoroscopy Time:  1 minutes 55 seconds Radiation Exposure Index (if provided by the fluoroscopic device): 2.19 mGy Number of Acquired Spot Images: 11 FINDINGS: Initial images demonstrate a fixation sideplate along the distal fibula. Fracture fragments are in near anatomic alignment. Medullary rod is subsequently placed within the tibia with proximal and distal fixation screws. Fixation screws are also noted in the distal aspect of the tibia. Fracture fragments are in near anatomic alignment. IMPRESSION: ORIF of distal tibial and fibular fractures. Electronically Signed   By: [name] M.D.   On: 10/01/2018 12:50      LAB RESULTS: Basic Metabolic Panel: Recent Labs  Lab 10/02/18 0412 10/03/18 0344  NA 135 136  K 3.9 3.8  CL 98 101  CO2 28 27   GLUCOSE 164* 186*  BUN 13 21*  CREATININE 0.92 0.89  CALCIUM 8.8* 8.5*   Liver Function Tests: No results for input(s): AST, ALT, ALKPHOS, BILITOT, PROT, ALBUMIN in the last 168 hours. No results for input(s): LIPASE, AMYLASE in the last 168 hours. No results for input(s): AMMONIA in the last 168 hours. CBC: Recent Labs  Lab 09/29/18 1622 10/01/18 1454 10/02/18 0412  WBC 11.4* 14.5* 16.1*  NEUTROABS 7.3  --   --   HGB 15.0 16.0 14.8  HCT 44.7 47.1 43.4  MCV 95.3 95.2 94.3  PLT 142* 139* 144*   Cardiac Enzymes: No results for input(s): CKTOTAL, CKMB, CKMBINDEX, TROPONINI in the last 168 hours. BNP: Invalid input(s): POCBNP CBG: Recent Labs  Lab 10/02/18 2122 10/03/18 0749  GLUCAP 170* 155*      Disposition and Follow-up: Discharge Instructions    Diet Carb Modified   Complete by:  As directed    Increase activity slowly   Complete by:  As directed        DISPOSITION: [facility] Follow up.   Specialty:  [place] Why:  A representative from [facility] will contact you to arrange start date and time for your therapy. Contact information: [address] [place] [address] [phone]        Altamese [place], MD. Schedule an appointment as soon as possible for a visit in 2 week(s).   Specialty:  Orthopedic Surgery Contact information: [place] [address] [phone]            Time coordinating discharge:  35 minutes  Signed:   Estill Cotta M.D. [facility] 10/03/2018, 11:25 AM

## 2018-11-09 ENCOUNTER — Ambulatory Visit: Payer: Medicaid Other | Admitting: Cardiology

## 2018-11-27 ENCOUNTER — Other Ambulatory Visit: Payer: Self-pay | Admitting: Cardiology

## 2019-01-09 ENCOUNTER — Telehealth (INDEPENDENT_AMBULATORY_CARE_PROVIDER_SITE_OTHER): Payer: Medicaid Other | Admitting: Cardiology

## 2019-01-09 ENCOUNTER — Encounter: Payer: Self-pay | Admitting: Cardiology

## 2019-01-09 VITALS — Ht 73.0 in | Wt 220.0 lb

## 2019-01-09 DIAGNOSIS — I2510 Atherosclerotic heart disease of native coronary artery without angina pectoris: Secondary | ICD-10-CM | POA: Diagnosis not present

## 2019-01-09 DIAGNOSIS — E782 Mixed hyperlipidemia: Secondary | ICD-10-CM

## 2019-01-09 NOTE — Progress Notes (Signed)
Virtual Visit via Telephone Note   This visit type was conducted due to national recommendations for restrictions regarding the COVID-19 Pandemic (e.g. social distancing) in an effort to limit this patient's exposure and mitigate transmission in our community.  Due to his co-morbid illnesses, this patient is at least at moderate risk for complications without adequate follow up.  This format is felt to be most appropriate for this patient at this time.  The patient did not have access to video technology/had technical difficulties with video requiring transitioning to audio format only (telephone).  All issues noted in this document were discussed and addressed.  No physical exam could be performed with this format.  Please refer to the patient's chart for his  consent to telehealth for [facility].   Date:  01/09/2019   ID:  Thomas AMBROCIO, DOB 1965/08/04, MRN [id number]  Patient Location: Home Provider Location: Office  PCP:  Sharilyn Sites, MD  Cardiologist:  Carlyle Dolly, MD  Electrophysiologist:  None   Evaluation Performed:  Follow-Up Visit  Chief Complaint:  Follow up  History of Present Illness:    [name] is a 53 y.o. male seen today for follow up of the following medical problems.   1. CAD - admit 03/2014 with inferior STEMI, received BMS to RCA. LVEF 45-50% by LV gram. 02/2017 nuclear stress: mild apical ischemia vs artifact. Overall low risk -02/2017 nuclear stress: inferior ischemia 04/2017 cath: patent coronaries, patent RCA stent. Elevated LVEDP 26  - no recent chest pain. No SOB/DOE - compliant with meds   2. Hyperlipidemia - tolerates pravastatin, reports prior myaglias on a prior statins  - compliant with statin. Labs followed by pcp   The patient does not have symptoms concerning for COVID-19 infection (fever, chills, cough, or new shortness of breath).    Past Medical History:  Diagnosis Date  . Anxiety   . Borderline diabetes   . Bulging  disc   . Chronic back pain   . Closed intra-articular fracture of distal end of left tibia   . Diabetes mellitus without complication ([place])   . Hypercholesteremia   . Myocardial infarction ([place])   . Pancreatitis    Past Surgical History:  Procedure Laterality Date  . ABDOMINAL SURGERY    . APPENDECTOMY    . BACK SURGERY    . CARDIAC CATHETERIZATION  2003   "trivial coronary artery disease," normal EF  . COLONOSCOPY N/A 04/16/2015   Procedure: COLONOSCOPY;  Surgeon: Daneil Dolin, MD;  [facility];  Service: Endoscopy;  Laterality: N/A;  12:45 PM  . COLONOSCOPY WITH PROPOFOL N/A 06/28/2018   Procedure: COLONOSCOPY WITH PROPOFOL;  Surgeon: Daneil Dolin, MD;  [facility]: [facility];  Service: Endoscopy;  Laterality: N/A;  9:45am  . LEFT HEART CATH AND CORONARY ANGIOGRAPHY N/A 05/09/2017   Procedure: LEFT HEART CATH AND CORONARY ANGIOGRAPHY;  Surgeon: Leonie Man, MD;  Location: [place] CV LAB;  Service: Cardiovascular;  Laterality: N/A;  . LEFT HEART CATHETERIZATION WITH CORONARY ANGIOGRAM N/A 03/13/2014   Procedure: LEFT HEART CATHETERIZATION WITH CORONARY ANGIOGRAM;  Surgeon: Leonie Man, MD;  Location: [facility] CATH LAB;  Service: Cardiovascular;  Laterality: N/A;  . OPEN REDUCTION INTERNAL FIXATION (ORIF) TIBIA/FIBULA FRACTURE Left 10/01/2018   Procedure: OPEN REDUCTION INTERNAL FIXATION LEFT PILON FRACTURE;  Surgeon: Altamese Lafayette, MD;  Location: [place];  Service: Orthopedics;  Laterality: Left;  . POLYPECTOMY  06/28/2018   Procedure: POLYPECTOMY;  Surgeon: Daneil Dolin, MD;  [facility];  Service: Endoscopy;;  (colon)     No outpatient medications have been marked as taking for the 01/09/19 encounter (Appointment) with Arnoldo Lenis, MD.     Allergies:   Patient has no known allergies.   Social History   Tobacco Use  . Smoking status: Current Every Day Smoker    Packs/day: 0.50    Years: 30.00    Pack years: 15.00    Types: Cigarettes     Start date: 10/25/1977  . Smokeless tobacco: Never Used  Substance Use Topics  . Alcohol use: Yes    Alcohol/week: 6.0 standard drinks    Types: 6 Cans of beer per week  . Drug use: No     Family Hx: The patient's family history includes Diabetes in his father and sister; Hyperlipidemia in his father, mother, and sister; Hypertension in his father, mother, and sister.  ROS:   Please see the history of present illness.     All other systems reviewed and are negative.   Prior CV studies:   The following studies were reviewed today:  02/2017 nuclear stress  No diagnostic ST segment changes to indicate ischemia.  Small, mild intensity, apical to basal inferior defect is partially reversible and suggestive of ischemia, although there is gut radiotracer uptake artifact adjacent to the inferior wall on rest imaging which could also be contributing to the defect reversibility.  This is a low risk study.  Nuclear stress EF: 63%.   04/2017 cath   Angiographically normal coronary arteries  Dist RCA BMS (VeriFlex 5.0 x 15), 0 %stenosed.  The left ventricular systolic function is normal. The left ventricular ejection fraction is 50-55% by visual estimate.  LV end diastolic pressure is moderately elevated.  Patient essentially has intra-graphic normal coronary arteries with widely RCA. No culprit lesion to explain inferior ischemia. Elevated LVEDP could explain microvascular ischemia.  Otherwise consider non-anginal etiology for chest pain.   Plan: Discharge home today after bed rest with TR band removal.  Labs/Other Tests and Data Reviewed:    EKG:  No ECG reviewed.  Recent Labs: 03/10/2018: ALT 26 10/02/2018: Hemoglobin 14.8; Platelets 144 10/03/2018: BUN 21; Creatinine, Ser 0.89; Potassium 3.8; Sodium 136   Recent Lipid Panel Lab Results  Component Value Date/Time   CHOL 345 (H) 01/20/2014 05:28 AM   TRIG 1,132 (H) 01/20/2014 05:28 AM   HDL 20 (L)  01/20/2014 05:28 AM   CHOLHDL 17.3 01/20/2014 05:28 AM   LDLCALC UNABLE TO CALCULATE IF TRIGLYCERIDE OVER 400 mg/dL 01/20/2014 05:28 AM    Wt Readings from Last 3 Encounters:  09/29/18 220 lb (99.8 kg)  07/03/18 220 lb (99.8 kg)  06/28/18 217 lb (98.4 kg)     Objective:    Vital Signs:   Today's Vitals   01/09/19 1524  Weight: 220 lb (99.8 kg)  Height: 6\' 1"  (1.854 m)   Body mass index is 29.03 kg/m.  Normal affect. Normal speech pattern and tone. No audible signs of SOB or wheezing. Comfortable, no apparent distress.   ASSESSMENT & PLAN:    1. CAD -no recent symptoms, continue currentmeds   2. Hyperlipidemia - continue statin, request pcp labs    COVID-19 Education: The signs and symptoms of COVID-19 were discussed with the patient and how to seek care for testing (follow up with PCP or arrange E-visit).  The importance of social distancing was discussed today.  Time:   Today, I have spent 13 minutes with the patient with  telehealth technology discussing the above problems.     Medication Adjustments/Labs and Tests Ordered: Current medicines are reviewed at length with the patient today.  Concerns regarding medicines are outlined above.   Tests Ordered: No orders of the defined types were placed in this encounter.   Medication Changes: No orders of the defined types were placed in this encounter.   Follow Up:  In Person in 6 month(s)  Signed, Carlyle Dolly, MD  01/09/2019 12:36 PM    [place]

## 2019-01-10 ENCOUNTER — Encounter: Payer: Self-pay | Admitting: *Deleted

## 2019-01-10 NOTE — Patient Instructions (Signed)

## 2019-01-22 ENCOUNTER — Telehealth: Payer: Self-pay | Admitting: *Deleted

## 2019-01-22 NOTE — Telephone Encounter (Signed)
Was advised to call our office to report his recent lipid lab work from his PCP. Was informed that his triglycerides and cholesterol was very elevated. Lab work requested and will be routed to Dr. Harl Bowie for review. Patient aware that he would be contacted once lab work reviewed by Dr. Harl Bowie.

## 2019-02-01 NOTE — Telephone Encounter (Signed)
Spoke with patient to confirm location lab work was done. 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 2nd request sent today.

## 2019-02-01 NOTE — Telephone Encounter (Signed)
I don't see where we received his labs, can we try again and have them scanned in   Zandra Abts MD

## 2019-02-06 ENCOUNTER — Telehealth: Payer: Self-pay | Admitting: *Deleted

## 2019-02-06 DIAGNOSIS — E782 Mixed hyperlipidemia: Secondary | ICD-10-CM

## 2019-02-06 NOTE — Telephone Encounter (Signed)
-----   Message from Arnoldo Lenis, MD sent at 02/05/2019 10:07 AM EDT ----- Cholesterol is very high. Please clarify with him that he is 100% sure this was a fasting sample, nothing to eat the morning of including any milk or coffee creamers at least 8 hours before test  Zandra Abts MD

## 2019-02-06 NOTE — Telephone Encounter (Signed)
Pt was not sure at all if he was fasting - thinks he may have eaten couple hours before and had some coffee with creamer - do you want to repeat lipids?

## 2019-02-07 NOTE — Telephone Encounter (Signed)
LM to return call.

## 2019-02-07 NOTE — Telephone Encounter (Signed)
Yes please, repeat a fasting lipid panel. Be sure nothing to eat starting 8 hours before exam. Can have water only   Zandra Abts MD

## 2019-02-18 NOTE — Telephone Encounter (Signed)
Left multiple messages to return call - will mail lab orders

## 2019-04-08 ENCOUNTER — Other Ambulatory Visit: Payer: Self-pay | Admitting: *Deleted

## 2019-04-08 MED ORDER — ISOSORBIDE MONONITRATE ER 60 MG PO TB24: 60.0000 mg | ORAL_TABLET | Freq: Every day | ORAL | 6 refills | Status: DC

## 2019-05-30 ENCOUNTER — Other Ambulatory Visit: Payer: Self-pay | Admitting: *Deleted

## 2019-05-30 MED ORDER — PRAVASTATIN SODIUM 80 MG PO TABS: 80.0000 mg | ORAL_TABLET | Freq: Every evening | ORAL | 0 refills | Status: DC

## 2019-07-23 ENCOUNTER — Telehealth: Payer: Self-pay | Admitting: Cardiology

## 2019-07-23 NOTE — Telephone Encounter (Signed)
Virtual Visit Pre-Appointment Phone Call  "(Name), I am calling you today to discuss your upcoming appointment. We are currently trying to limit exposure to the virus that causes COVID-19 by seeing patients at home rather than in the office."  1. "What is the BEST phone number to call the day of the visit?" - [phone]  2. Do you have or have access to (through a family member/friend) a smartphone with video capability that we can use for your visit?" a. If yes - list this number in appt notes as cell (if different from BEST phone #) and list the appointment type as a VIDEO visit in appointment notes b. If no - list the appointment type as a PHONE visit in appointment notes  3. Confirm consent - "In the setting of the current Covid19 crisis, you are scheduled for a (phone or video) visit with your provider on (date) at (time).  Just as we do with many in-office visits, in order for you to participate in this visit, we must obtain consent.  If you'd like, I can send this to your mychart (if signed up) or email for you to review.  Otherwise, I can obtain your verbal consent now.  All virtual visits are billed to your insurance company just like a normal visit would be.  By agreeing to a virtual visit, we'd like you to understand that the technology does not allow for your provider to perform an examination, and thus may limit your provider's ability to fully assess your condition. If your provider identifies any concerns that need to be evaluated in person, we will make arrangements to do so.  Finally, though the technology is pretty good, we cannot assure that it will always work on either your or our end, and in the setting of a video visit, we may have to convert it to a phone-only visit.  In either situation, we cannot ensure that we have a secure connection.  Are you willing to proceed?" STAFF: Did the patient verbally acknowledge consent to telehealth visit? Document YES/NO here: YES    4. Advise patient to be prepared - "Two hours prior to your appointment, go ahead and check your blood pressure, pulse, oxygen saturation, and your weight (if you have the equipment to check those) and write them all down. When your visit starts, your provider will ask you for this information. If you have an Apple Watch or Kardia device, please plan to have heart rate information ready on the day of your appointment. Please have a pen and paper handy nearby the day of the visit as well."  5. Give patient instructions for MyChart download to smartphone OR Doximity/Doxy.me as below if video visit (depending on what platform provider is using)  6. Inform patient they will receive a phone call 15 minutes prior to their appointment time (may be from unknown caller ID) so they should be prepared to answer    [name] has been deemed a candidate for a follow-up tele-health visit to limit community exposure during the Covid-19 pandemic. I spoke with the patient via phone to ensure availability of phone/video source, confirm preferred email & phone number, and discuss instructions and expectations.  I reminded [name] to be prepared with any vital sign and/or heart rhythm information that could potentially be obtained via home monitoring, at the time of his visit. I reminded [name] to expect a phone call prior to his visit.  Lynnda Child Slaughter 07/23/2019 3:12 PM   INSTRUCTIONS FOR DOWNLOADING THE MYCHART APP TO SMARTPHONE  - The patient must first make sure to have activated MyChart and know their login information - If Apple, go to [employer] and type in MyChart in the search bar and download the app. If Android, ask patient to go to [employer] and type in [place] in the search bar and download the app. The app is free but as with any other app downloads, their phone may require them to verify saved payment information or Apple/Android password.  - The  patient will need to then log into the app with their MyChart username and password, and select [facility] as their healthcare provider to link the account. When it is time for your visit, go to the MyChart app, find appointments, and click Begin Video Visit. Be sure to Select Allow for your device to access the Microphone and Camera for your visit. You will then be connected, and your provider will be with you shortly.  **If they have any issues connecting, or need assistance please contact MyChart service desk [phone])**  **If using a computer, in order to ensure the best quality for their visit they will need to use either of the following Internet Browsers: [employer], or Google Chrome**  IF USING DOXIMITY or DOXY.ME - The patient will receive a link just prior to their visit by text.     FULL LENGTH CONSENT FOR TELE-HEALTH VISIT   I hereby voluntarily request, consent and authorize [facility] and its employed or contracted physicians, physician assistants, nurse practitioners or other licensed health care professionals (the Practitioner), to provide me with telemedicine health care services (the Services") as deemed necessary by the treating Practitioner. I acknowledge and consent to receive the Services by the Practitioner via telemedicine. I understand that the telemedicine visit will involve communicating with the Practitioner through live audiovisual communication technology and the disclosure of certain medical information by electronic transmission. I acknowledge that I have been given the opportunity to request an in-person assessment or other available alternative prior to the telemedicine visit and am voluntarily participating in the telemedicine visit.  I understand that I have the right to withhold or withdraw my consent to the use of telemedicine in the course of my care at any time, without affecting my right to future care or treatment, and that the  Practitioner or I may terminate the telemedicine visit at any time. I understand that I have the right to inspect all information obtained and/or recorded in the course of the telemedicine visit and may receive copies of available information for a reasonable fee.  I understand that some of the potential risks of receiving the Services via telemedicine include:   Delay or interruption in medical evaluation due to technological equipment failure or disruption;  Information transmitted may not be sufficient (e.g. poor resolution of images) to allow for appropriate medical decision making by the Practitioner; and/or   In rare instances, security protocols could fail, causing a breach of personal health information.  Furthermore, I acknowledge that it is my responsibility to provide information about my medical history, conditions and care that is complete and accurate to the best of my ability. I acknowledge that Practitioner's advice, recommendations, and/or decision may be based on factors not within their control, such as incomplete or inaccurate data provided by me or distortions of diagnostic images or specimens that may result from electronic transmissions. I understand that the  practice of medicine is not an Chief Strategy Officer and that Practitioner makes no warranties or guarantees regarding treatment outcomes. I acknowledge that I will receive a copy of this consent concurrently upon execution via email to the email address I last provided but may also request a printed copy by calling the office of [facility].    I understand that my insurance will be billed for this visit.   I have read or had this consent read to me.  I understand the contents of this consent, which adequately explains the benefits and risks of the Services being provided via telemedicine.   I have been provided ample opportunity to ask questions regarding this consent and the Services and have had my questions answered to my  satisfaction.  I give my informed consent for the services to be provided through the use of telemedicine in my medical care  By participating in this telemedicine visit I agree to the above.

## 2019-07-29 ENCOUNTER — Other Ambulatory Visit: Payer: Self-pay | Admitting: Cardiology

## 2019-07-29 MED ORDER — PRAVASTATIN SODIUM 80 MG PO TABS: 80.0000 mg | ORAL_TABLET | Freq: Every evening | ORAL | 0 refills | Status: DC

## 2019-07-29 NOTE — Telephone Encounter (Signed)
Medication sent to pharmacy  

## 2019-07-29 NOTE — Telephone Encounter (Signed)
*  STAT* If patient is at the pharmacy, call can be transferred to refill team.   1. Which medications need to be refilled?  pravastatin (PRAVACHOL) 80 MG tablet   2. Which pharmacy/location (including street and city if local pharmacy) is medication to be sent to?[facility] in [place] on [address]  3. Do they need a 30 day or 90 day supply?

## 2019-08-05 ENCOUNTER — Telehealth (INDEPENDENT_AMBULATORY_CARE_PROVIDER_SITE_OTHER): Payer: Medicare Other | Admitting: Cardiology

## 2019-08-05 ENCOUNTER — Encounter: Payer: Self-pay | Admitting: Cardiology

## 2019-08-05 ENCOUNTER — Encounter: Payer: Self-pay | Admitting: *Deleted

## 2019-08-05 VITALS — Ht 73.0 in | Wt 217.0 lb

## 2019-08-05 DIAGNOSIS — E782 Mixed hyperlipidemia: Secondary | ICD-10-CM

## 2019-08-05 DIAGNOSIS — I2510 Atherosclerotic heart disease of native coronary artery without angina pectoris: Secondary | ICD-10-CM

## 2019-08-05 NOTE — Patient Instructions (Addendum)
Medication Instructions:   Your physician recommends that you continue on your current medications as directed. Please refer to the Current Medication list given to you today.  Labwork: Your physician recommends that you return for a FASTING lipid profile: as soon as possible. Please do not eat or drink for at least 8 hours when you have this done. You may take your medications that morning with a sip of water. You can have this done at [facility] in [place].   Testing/Procedures:  NONE  Follow-Up:  Your physician recommends that you schedule a follow-up appointment in: 3 months (virtual).  Any Other Special Instructions Will Be Listed Below (If Applicable).  If you need a refill on your cardiac medications before your next appointment, please call your pharmacy.

## 2019-08-05 NOTE — Addendum Note (Signed)
Addended by: Merlene Laughter on: 08/05/2019 10:29 AM   Modules accepted: Orders

## 2019-08-05 NOTE — Progress Notes (Signed)
Virtual Visit via Telephone Note   This visit type was conducted due to national recommendations for restrictions regarding the COVID-19 Pandemic (e.g. social distancing) in an effort to limit this patient's exposure and mitigate transmission in our community.  Due to his co-morbid illnesses, this patient is at least at moderate risk for complications without adequate follow up.  This format is felt to be most appropriate for this patient at this time.  The patient did not have access to video technology/had technical difficulties with video requiring transitioning to audio format only (telephone).  All issues noted in this document were discussed and addressed.  No physical exam could be performed with this format.  Please refer to the patient's chart for his  consent to telehealth for [facility].   Date:  08/05/2019   ID:  [name], [name] 11-Mar-1966, MRN [id number]  Patient Location: Home Provider Location: Office  PCP:  Sharilyn Sites, MD  Cardiologist:  Carlyle Dolly, MD  Electrophysiologist:  None   Evaluation Performed:  Follow-Up Visit  Chief Complaint:  Follow up visit  History of Present Illness:    [name] is a 54 y.o. male seen today for follow up of the following medical problems.   1. CAD - admit 03/2014 with inferior STEMI, received BMS to RCA. LVEF 45-50% by LV gram. 02/2017 nuclear stress: mild apical ischemia vs artifact. Overall low risk -02/2017 nuclear stress: inferior ischemia 04/2017 cath: patent coronaries, patent RCA stent. Elevated LVEDP 26  - no chest pains, no sob or doe.    2. Hyperlipidemia - tolerates pravastatin, reports prior myaglias on a prior statins - elevated TGs by last labs. Was to have repeat labs but does not appear this was done. Most recent labs he reports he had coffee with whole milk prior to labs, was not a true fasting lab - muscle aches on gemfibrozil in the past, tolerated fenofibrate but had some financial issues  with costs of medicines about 5 years ago and medication was stopped     The patient does not have symptoms concerning for COVID-19 infection (fever, chills, cough, or new shortness of breath).    Past Medical History:  Diagnosis Date  . Anxiety   . Borderline diabetes   . Bulging disc   . Chronic back pain   . Closed intra-articular fracture of distal end of left tibia   . Diabetes mellitus without complication ([place])   . Hypercholesteremia   . Myocardial infarction ([place])   . Pancreatitis    Past Surgical History:  Procedure Laterality Date  . ABDOMINAL SURGERY    . APPENDECTOMY    . BACK SURGERY    . CARDIAC CATHETERIZATION  2003   "trivial coronary artery disease," normal EF  . COLONOSCOPY N/A 04/16/2015   Procedure: COLONOSCOPY;  Surgeon: Daneil Dolin, MD;  [facility];  Service: Endoscopy;  Laterality: N/A;  12:45 PM  . COLONOSCOPY WITH PROPOFOL N/A 06/28/2018   Procedure: COLONOSCOPY WITH PROPOFOL;  Surgeon: Daneil Dolin, MD;  [facility];  Service: Endoscopy;  Laterality: N/A;  9:45am  . LEFT HEART CATH AND CORONARY ANGIOGRAPHY N/A 05/09/2017   Procedure: LEFT HEART CATH AND CORONARY ANGIOGRAPHY;  Surgeon: Leonie Man, MD;  Location: [facility];  Service: Cardiovascular;  Laterality: N/A;  . LEFT HEART CATHETERIZATION WITH CORONARY ANGIOGRAM N/A 03/13/2014   Procedure: LEFT HEART CATHETERIZATION WITH CORONARY ANGIOGRAM;  Surgeon: Leonie Man, MD;  Location: [facility] CATH LAB;  Service: Cardiovascular;  Laterality: N/A;  . OPEN REDUCTION INTERNAL FIXATION (ORIF) TIBIA/FIBULA FRACTURE Left 10/01/2018   Procedure: OPEN REDUCTION INTERNAL FIXATION LEFT PILON FRACTURE;  Surgeon: Altamese Norwich, MD;  Location: [facility];  Service: Orthopedics;  Laterality: Left;  . POLYPECTOMY  06/28/2018   Procedure: POLYPECTOMY;  Surgeon: Daneil Dolin, MD;  [facility];  Service: Endoscopy;;  (colon)     No outpatient medications have been  marked as taking for the 08/05/19 encounter (Appointment) with Arnoldo Lenis, MD.     Allergies:   Patient has no known allergies.   Social History   Tobacco Use  . Smoking status: Current Every Day Smoker    Packs/day: 0.50    Years: 30.00    Pack years: 15.00    Types: Cigarettes    Start date: 10/25/1977  . Smokeless tobacco: Never Used  Substance Use Topics  . Alcohol use: Yes    Alcohol/week: 6.0 standard drinks    Types: 6 Cans of beer per week  . Drug use: No     Family Hx: The patient's family history includes Diabetes in his father and sister; Hyperlipidemia in his father, mother, and sister; Hypertension in his father, mother, and sister.  ROS:   Please see the history of present illness.     All other systems reviewed and are negative.   Prior CV studies:   The following studies were reviewed today:  02/2017 nuclear stress  No diagnostic ST segment changes to indicate ischemia.  Small, mild intensity, apical to basal inferior defect is partially reversible and suggestive of ischemia, although there is gut radiotracer uptake artifact adjacent to the inferior wall on rest imaging which could also be contributing to the defect reversibility.  This is a low risk study.  Nuclear stress EF: 63%.   04/2017 cath   Angiographically normal coronary arteries  Dist RCA BMS (VeriFlex 5.0 x 15), 0 %stenosed.  The left ventricular systolic function is normal. The left ventricular ejection fraction is 50-55% by visual estimate.  LV end diastolic pressure is moderately elevated.  Patient essentially has intra-graphic normal coronary arteries with widely RCA. No culprit lesion to explain inferior ischemia. Elevated LVEDP could explain microvascular ischemia.  Otherwise consider non-anginal etiology for chest pain.   Plan: Discharge home today after bed rest with TR band removal.  Labs/Other Tests and Data Reviewed:    EKG:  No ECG  reviewed.  Recent Labs: 10/02/2018: Hemoglobin 14.8; Platelets 144 10/03/2018: BUN 21; Creatinine, Ser 0.89; Potassium 3.8; Sodium 136   Recent Lipid Panel Lab Results  Component Value Date/Time   CHOL 345 (H) 01/20/2014 05:28 AM   TRIG 1,132 (H) 01/20/2014 05:28 AM   HDL 20 (L) 01/20/2014 05:28 AM   CHOLHDL 17.3 01/20/2014 05:28 AM   LDLCALC UNABLE TO CALCULATE IF TRIGLYCERIDE OVER 400 mg/dL 01/20/2014 05:28 AM    Wt Readings from Last 3 Encounters:  01/09/19 220 lb (99.8 kg)  09/29/18 220 lb (99.8 kg)  07/03/18 220 lb (99.8 kg)     Objective:    Vital Signs:   Today's Vitals   08/05/19 0917  Weight: 217 lb (98.4 kg)  Height: 6\' 1"  (1.854 m)   Body mass index is 28.63 kg/m. Normal affect. Normal speech pattern and tone. Comfortable, no apparent distress. No audible signs of SOB or wheezing.   ASSESSMENT & PLAN:    1. CAD - no symptoms, continue current meds   2. Hyperlipidemia -difficult to get accurate labs,  last lipid tests were not true fasting panels. Reports one with pcp just a few weeks ago where he had coffee with whole milk prior - repeat lipid panel, we discussed must be a true fasting panel - if TGs above 400 would consider restarting fenofibrate.     COVID-19 Education: The signs and symptoms of COVID-19 were discussed with the patient and how to seek care for testing (follow up with PCP or arrange E-visit).  The importance of social distancing was discussed today.  Time:   Today, I have spent 14 minutes with the patient with telehealth technology discussing the above problems.     Medication Adjustments/Labs and Tests Ordered: Current medicines are reviewed at length with the patient today.  Concerns regarding medicines are outlined above.   Tests Ordered: No orders of the defined types were placed in this encounter.   Medication Changes: No orders of the defined types were placed in this encounter.   Follow Up:  Virtual Visit  3  months  Signed, Carlyle Dolly, MD  08/05/2019 9:02 AM    [place]

## 2019-08-18 ENCOUNTER — Other Ambulatory Visit: Payer: Self-pay | Admitting: Cardiology

## 2019-09-11 ENCOUNTER — Other Ambulatory Visit: Payer: Self-pay | Admitting: Cardiology

## 2019-09-11 ENCOUNTER — Telehealth: Payer: Self-pay | Admitting: Cardiology

## 2019-09-11 MED ORDER — LISINOPRIL 2.5 MG PO TABS: ORAL_TABLET | ORAL | 1 refills | Status: DC

## 2019-09-11 NOTE — Telephone Encounter (Signed)
*  STAT* If patient is at the pharmacy, call can be transferred to refill team.   1. Which medications need to be refilled?  lisinopril (ZESTRIL) 2.5 MG tablet    2. Which pharmacy/location (including street and city if local pharmacy) is medication to be sent to? [facility] - [address]  3. Do they need a 30 day or 90 day supply?

## 2019-09-11 NOTE — Telephone Encounter (Signed)
Medication sent to pharmacy  

## 2019-10-25 ENCOUNTER — Other Ambulatory Visit: Payer: Self-pay | Admitting: *Deleted

## 2019-10-25 MED ORDER — PRAVASTATIN SODIUM 80 MG PO TABS: 80.0000 mg | ORAL_TABLET | Freq: Every evening | ORAL | 2 refills | Status: DC

## 2019-11-10 ENCOUNTER — Other Ambulatory Visit: Payer: Self-pay | Admitting: Cardiology

## 2019-11-11 ENCOUNTER — Encounter: Payer: Self-pay | Admitting: Cardiology

## 2019-11-11 ENCOUNTER — Telehealth (INDEPENDENT_AMBULATORY_CARE_PROVIDER_SITE_OTHER): Payer: Medicare Other | Admitting: Cardiology

## 2019-11-11 VITALS — Ht 73.0 in | Wt 212.0 lb

## 2019-11-11 DIAGNOSIS — E782 Mixed hyperlipidemia: Secondary | ICD-10-CM | POA: Diagnosis not present

## 2019-11-11 DIAGNOSIS — I2510 Atherosclerotic heart disease of native coronary artery without angina pectoris: Secondary | ICD-10-CM

## 2019-11-11 MED ORDER — PRAVASTATIN SODIUM 80 MG PO TABS: 80.0000 mg | ORAL_TABLET | Freq: Every evening | ORAL | 1 refills | Status: DC

## 2019-11-11 MED ORDER — FUROSEMIDE 20 MG PO TABS: ORAL_TABLET | ORAL | 1 refills | Status: DC

## 2019-11-11 MED ORDER — LISINOPRIL 2.5 MG PO TABS: ORAL_TABLET | ORAL | 1 refills | Status: DC

## 2019-11-11 MED ORDER — ISOSORBIDE MONONITRATE ER 60 MG PO TB24: ORAL_TABLET | ORAL | 1 refills | Status: DC

## 2019-11-11 MED ORDER — CARVEDILOL 3.125 MG PO TABS: ORAL_TABLET | ORAL | 1 refills | Status: DC

## 2019-11-11 NOTE — Addendum Note (Signed)
Addended by: Julian Hy T on: 11/11/2019 09:51 AM   Modules accepted: Orders

## 2019-11-11 NOTE — Patient Instructions (Signed)
Your physician recommends that you schedule a follow-up appointment in: [place] recommends that you continue on your current medications as directed. Please refer to the Current Medication list given to you today.  Your physician recommends that you return for lab work LIPIDS - PLEASE FAST FOR 6-8 HOURS PRIOR TO LAB WORK   Thank you for choosing [facility]!!

## 2019-11-11 NOTE — Progress Notes (Signed)
Virtual Visit via Telephone Note   This visit type was conducted due to national recommendations for restrictions regarding the COVID-19 Pandemic (e.g. social distancing) in an effort to limit this patient's exposure and mitigate transmission in our community.  Due to his co-morbid illnesses, this patient is at least at moderate risk for complications without adequate follow up.  This format is felt to be most appropriate for this patient at this time.  The patient did not have access to video technology/had technical difficulties with video requiring transitioning to audio format only (telephone).  All issues noted in this document were discussed and addressed.  No physical exam could be performed with this format.  Please refer to the patient's chart for his  consent to telehealth for [facility].   The patient was identified using 2 identifiers.  Date:  11/11/2019   ID:  [name], DOB 1966-07-06, MRN [id number]  Patient Location: Home Provider Location: Office  PCP:  Sharilyn Sites, MD  Cardiologist:  Carlyle Dolly, MD  Electrophysiologist:  None   Evaluation Performed:  Follow-Up Visit  Chief Complaint:  Follow up visit  History of Present Illness:    [name] is a 54 y.o. male  seen today for follow up of the following medical problems.   1. CAD - admit 03/2014 with inferior STEMI, received BMS to RCA. LVEF 45-50% by LV gram. 02/2017 nuclear stress: mild apical ischemia vs artifact. Overall low risk -02/2017 nuclear stress: inferior ischemia 04/2017 cath: patent coronaries, patent RCA stent. Elevated LVEDP 26  - dull pain mid chest to right side. 2-3/10 in severity. Seems to occur with activity. No recent SOB/DOE. Lasts just a few seconds. Occurs about once a week   2. Hyperlipidemia - tolerates pravastatin, reports prior myaglias on a prior statins - elevated TGs by last labs. Was to have repeat labs but does not appear this was done. Most recent labs he  reports he had coffee with whole milk prior to labs, was not a true fasting lab - muscle aches on gemfibrozil in the past, tolerated fenofibrate but had some financial issues with costs of medicines about 5 years ago and medication was stopped  - he did not go for the repeat labs we ordered las visit     The patient does not have symptoms concerning for COVID-19 infection (fever, chills, cough, or new shortness of breath).    Past Medical History:  Diagnosis Date  . Anxiety   . Borderline diabetes   . Bulging disc   . Chronic back pain   . Closed intra-articular fracture of distal end of left tibia   . Diabetes mellitus without complication ([place])   . Hypercholesteremia   . Myocardial infarction ([place])   . Pancreatitis    Past Surgical History:  Procedure Laterality Date  . ABDOMINAL SURGERY    . APPENDECTOMY    . BACK SURGERY    . CARDIAC CATHETERIZATION  2003   "trivial coronary artery disease," normal EF  . COLONOSCOPY N/A 04/16/2015   Procedure: COLONOSCOPY;  Surgeon: Daneil Dolin, MD;  [facility];  Service: Endoscopy;  Laterality: N/A;  12:45 PM  . COLONOSCOPY WITH PROPOFOL N/A 06/28/2018   Procedure: COLONOSCOPY WITH PROPOFOL;  Surgeon: Daneil Dolin, MD;  [facility];  Service: Endoscopy;  Laterality: N/A;  9:45am  . LEFT HEART CATH AND CORONARY ANGIOGRAPHY N/A 05/09/2017   Procedure: LEFT HEART CATH AND CORONARY ANGIOGRAPHY;  Surgeon: Leonie Man,  MD;  Location: [facility];  Service: Cardiovascular;  Laterality: N/A;  . LEFT HEART CATHETERIZATION WITH CORONARY ANGIOGRAM N/A 03/13/2014   Procedure: LEFT HEART CATHETERIZATION WITH CORONARY ANGIOGRAM;  Surgeon: Leonie Man, MD;  Location: [facility] CATH LAB;  Service: Cardiovascular;  Laterality: N/A;  . OPEN REDUCTION INTERNAL FIXATION (ORIF) TIBIA/FIBULA FRACTURE Left 10/01/2018   Procedure: OPEN REDUCTION INTERNAL FIXATION LEFT PILON FRACTURE;  Surgeon: Altamese [place], MD;  Location: [facility];  Service: Orthopedics;  Laterality: Left;  . POLYPECTOMY  06/28/2018   Procedure: POLYPECTOMY;  Surgeon: Daneil Dolin, MD;  [facility];  Service: Endoscopy;;  (colon)     No outpatient medications have been marked as taking for the 11/11/19 encounter (Appointment) with Arnoldo Lenis, MD.     Allergies:   Patient has no known allergies.   Social History   Tobacco Use  . Smoking status: Current Every Day Smoker    Packs/day: 0.50    Years: 30.00    Pack years: 15.00    Types: Cigarettes    Start date: 10/25/1977  . Smokeless tobacco: Never Used  Substance Use Topics  . Alcohol use: Yes    Alcohol/week: 6.0 standard drinks    Types: 6 Cans of beer per week  . Drug use: No     Family Hx: The patient's family history includes Diabetes in his father and sister; Hyperlipidemia in his father, mother, and sister; Hypertension in his father, mother, and sister.  ROS:   Please see the history of present illness.     All other systems reviewed and are negative.   Prior CV studies:   The following studies were reviewed today:  02/2017 nuclear stress  No diagnostic ST segment changes to indicate ischemia.  Small, mild intensity, apical to basal inferior defect is partially reversible and suggestive of ischemia, although there is gut radiotracer uptake artifact adjacent to the inferior wall on rest imaging which could also be contributing to the defect reversibility.  This is a low risk study.  Nuclear stress EF: 63%.   04/2017 cath   Angiographically normal coronary arteries  Dist RCA BMS (VeriFlex 5.0 x 15), 0 %stenosed.  The left ventricular systolic function is normal. The left ventricular ejection fraction is 50-55% by visual estimate.  LV end diastolic pressure is moderately elevated.  Patient essentially has intra-graphic normal coronary arteries with widely RCA. No culprit lesion to explain inferior ischemia. Elevated LVEDP could  explain microvascular ischemia.  Otherwise consider non-anginal etiology for chest pain.   Plan: Discharge home today after bed rest with TR band removal.  Labs/Other Tests and Data Reviewed:    EKG:  No ECG reviewed.  Recent Labs: No results found for requested labs within last 8760 hours.   Recent Lipid Panel Lab Results  Component Value Date/Time   CHOL 345 (H) 01/20/2014 05:28 AM   TRIG 1,132 (H) 01/20/2014 05:28 AM   HDL 20 (L) 01/20/2014 05:28 AM   CHOLHDL 17.3 01/20/2014 05:28 AM   LDLCALC UNABLE TO CALCULATE IF TRIGLYCERIDE OVER 400 mg/dL 01/20/2014 05:28 AM    Wt Readings from Last 3 Encounters:  08/05/19 217 lb (98.4 kg)  01/09/19 220 lb (99.8 kg)  09/29/18 220 lb (99.8 kg)     Objective:    Vital Signs:   Today's Vitals   11/11/19 0920  Weight: 212 lb (96.2 kg)  Height: 6\' 1"  (1.854 m)   Body mass index is 27.97 kg/m. Normal affect. Normal speech  pattern and tone. Comfortable, no apparent distress. No audible signs of sob or wheezing.   ASSESSMENT & PLAN:    1. CAD - mild nonspecific symptoms at times, monitor at this time - reassess with visit in 6 weeks   2. Hyperlipidemia -very high TGs by Jan labs but had coffee with whole milk just prior. He did not go for the repeat labs we ordered last visit - reorder FLP, we discussed needs to be fasting - consider fenfibrate vs vascepa pending TGs results.     COVID-19 Education: The signs and symptoms of COVID-19 were discussed with the patient and how to seek care for testing (follow up with PCP or arrange E-visit).  The importance of social distancing was discussed today.  Time:   Today, I have spent 18 minutes with the patient with telehealth technology discussing the above problems.     Medication Adjustments/Labs and Tests Ordered: Current medicines are reviewed at length with the patient today.  Concerns regarding medicines are outlined above.   Tests Ordered: No orders of the defined  types were placed in this encounter.   Medication Changes: No orders of the defined types were placed in this encounter.   Follow Up:  Virtual Visit  in 6 week(s)  Signed, Carlyle Dolly, MD  11/11/2019 8:18 AM    [place]

## 2019-12-05 ENCOUNTER — Encounter: Payer: Self-pay | Admitting: Internal Medicine

## 2019-12-05 ENCOUNTER — Other Ambulatory Visit: Payer: Self-pay

## 2019-12-05 ENCOUNTER — Ambulatory Visit (INDEPENDENT_AMBULATORY_CARE_PROVIDER_SITE_OTHER): Payer: Medicare Other | Admitting: Internal Medicine

## 2019-12-05 VITALS — BP 118/68 | HR 79 | Temp 97.9°F | Ht 73.0 in | Wt 212.0 lb

## 2019-12-05 DIAGNOSIS — R739 Hyperglycemia, unspecified: Secondary | ICD-10-CM | POA: Diagnosis not present

## 2019-12-05 DIAGNOSIS — E781 Pure hyperglyceridemia: Secondary | ICD-10-CM

## 2019-12-05 DIAGNOSIS — Z794 Long term (current) use of insulin: Secondary | ICD-10-CM | POA: Diagnosis not present

## 2019-12-05 DIAGNOSIS — E1165 Type 2 diabetes mellitus with hyperglycemia: Secondary | ICD-10-CM | POA: Diagnosis not present

## 2019-12-05 LAB — GLUCOSE, POCT (MANUAL RESULT ENTRY): POC Glucose: 308 mg/dl — AB (ref 70–99)

## 2019-12-05 MED ORDER — FENOFIBRATE 160 MG PO TABS: 160.0000 mg | ORAL_TABLET | Freq: Every day | ORAL | 6 refills | Status: DC

## 2019-12-05 MED ORDER — GLIPIZIDE 5 MG PO TABS: 5.0000 mg | ORAL_TABLET | Freq: Two times a day (BID) | ORAL | 2 refills | Status: DC

## 2019-12-05 NOTE — Progress Notes (Signed)
Name: [name]  MRN/ DOB: [id number], 05-11-1966   Age/ Sex: 54 y.o., male    PCP: Sharilyn Sites, MD   Reason for Endocrinology Evaluation: Type 2 Diabetes Mellitus     Date of Initial Endocrinology Visit: 12/05/2019     PATIENT IDENTIFIER: [name] is a 54 y.o. male with a past medical history of T2Dm, Dyslipidemia and Hx of pancreatitis  . The patient presented for initial endocrinology clinic visit on 12/05/2019 for consultative assistance with his diabetes management.    HPI: [name] was    Diagnosed with DM 2015 Prior Medications tried/Intolerance: just metformin  Currently checking blood sugars 0 x / day Hypoglycemia episodes : no            Hemoglobin A1c has ranged from 7.8% in 2015, peaking at 9.6% in 2021. Patient required assistance for hypoglycemia: no Patient has required hospitalization within the last 1 year from hyper or hypoglycemia:no   In terms of diet, the patient eats 1-2 meals, snacks regularly and drinks sugar-sweetened beverages.   Drinks occasional ETOH beverages    HOME DIABETES REGIMEN: Metformin 1000 mg BID  Glimepiride 2 mg daily - not taking it     Statin: yes ACE-I/ARB: yes Prior Diabetic Education: no   METER DOWNLOAD SUMMARY: Did not bring     DIABETIC COMPLICATIONS: Microvascular complications:   Neuropathy  Denies: retinopathy, CKD   Last eye exam: Completed 2020  Macrovascular complications:   CAD ( s/P stent)   Denies: PVD, CVA   PAST HISTORY: Past Medical History:  Past Medical History:  Diagnosis Date  . Anxiety   . Borderline diabetes   . Bulging disc   . Chronic back pain   . Closed intra-articular fracture of distal end of left tibia   . Diabetes mellitus without complication ([place])   . Hypercholesteremia   . Myocardial infarction ([place])   . Pancreatitis    Past Surgical History:  Past Surgical History:  Procedure Laterality Date  . ABDOMINAL SURGERY    . APPENDECTOMY    . BACK SURGERY     . CARDIAC CATHETERIZATION  2003   "trivial coronary artery disease," normal EF  . COLONOSCOPY N/A 04/16/2015   Procedure: COLONOSCOPY;  Surgeon: Daneil Dolin, MD;  [facility];  Service: Endoscopy;  Laterality: N/A;  12:45 PM  . COLONOSCOPY WITH PROPOFOL N/A 06/28/2018   Procedure: COLONOSCOPY WITH PROPOFOL;  Surgeon: Daneil Dolin, MD;  [facility];  Service: Endoscopy;  Laterality: N/A;  9:45am  . LEFT HEART CATH AND CORONARY ANGIOGRAPHY N/A 05/09/2017   Procedure: LEFT HEART CATH AND CORONARY ANGIOGRAPHY;  Surgeon: Leonie Man, MD;  Location: [facility] [facility];  Service: Cardiovascular;  Laterality: N/A;  . LEFT HEART CATHETERIZATION WITH CORONARY ANGIOGRAM N/A 03/13/2014   Procedure: LEFT HEART CATHETERIZATION WITH CORONARY ANGIOGRAM;  Surgeon: Leonie Man, MD;  Location: [facility];  Service: Cardiovascular;  Laterality: N/A;  . OPEN REDUCTION INTERNAL FIXATION (ORIF) TIBIA/FIBULA FRACTURE Left 10/01/2018   Procedure: OPEN REDUCTION INTERNAL FIXATION LEFT PILON FRACTURE;  Surgeon: Altamese Wells Branch, MD;  Location: [place];  Service: Orthopedics;  Laterality: Left;  . POLYPECTOMY  06/28/2018   Procedure: POLYPECTOMY;  Surgeon: Daneil Dolin, MD;  [facility];  Service: Endoscopy;;  (colon)      Social History:  reports that he has been smoking cigarettes. He started smoking about 42 years ago. He has a 15.00 pack-year smoking history.  He has never used smokeless tobacco. He reports current alcohol use of about 6.0 standard drinks of alcohol per week. He reports that he does not use drugs. Family History:  Family History  Problem Relation Age of Onset  . Diabetes Father   . Hyperlipidemia Father   . Hypertension Father   . Hypertension Mother   . Hyperlipidemia Mother   . Hyperlipidemia Sister   . Hypertension Sister   . Diabetes Sister      HOME MEDICATIONS: Allergies as of 12/05/2019   No Known Allergies     Medication List        Accurate as of Dec 05, 2019 10:31 AM. If you have any questions, ask your nurse or doctor.        aspirin 81 MG chewable tablet Chew 1 tablet (81 mg total) by mouth daily.   carvedilol 3.125 MG tablet Commonly known as: COREG TAKE 1 TABLET(3.125 MG) BY MOUTH TWICE DAILY WITH A MEAL   furosemide 20 MG tablet Commonly known as: LASIX DAILY AS NEEDED   ibuprofen 200 MG tablet Commonly known as: ADVIL Take 800 mg by mouth every 6 (six) hours as needed for fever, headache or mild pain.   isosorbide mononitrate 60 MG 24 hr tablet Commonly known as: IMDUR TAKE 1 TABLET(60 MG) BY MOUTH DAILY   lisinopril 2.5 MG tablet Commonly known as: ZESTRIL TAKE 1 TABLET(2.5 MG) BY MOUTH DAILY   meloxicam 15 MG tablet Commonly known as: MOBIC Take 1 tablet by mouth daily.   metFORMIN 1000 MG tablet Commonly known as: GLUCOPHAGE Take 1,000 mg by mouth 2 (two) times daily.   nitroGLYCERIN 0.4 MG SL tablet Commonly known as: NITROSTAT Place 1 tablet (0.4 mg total) under the tongue every 5 (five) minutes as needed for chest pain.   pravastatin 80 MG tablet Commonly known as: PRAVACHOL Take 1 tablet (80 mg total) by mouth every evening.   zolpidem 10 MG tablet Commonly known as: AMBIEN Take 10 mg by mouth at bedtime as needed.        ALLERGIES: No Known Allergies   REVIEW OF SYSTEMS: A comprehensive ROS was conducted with the patient and is negative except as per HPI and below:  Review of Systems  Gastrointestinal: Negative for diarrhea and nausea.  Genitourinary: Negative for frequency.  Neurological: Positive for tingling.  Endo/Heme/Allergies: Negative for polydipsia.      OBJECTIVE:   VITAL SIGNS: BP 118/68 (BP Location: Left Arm, Patient Position: Sitting, Cuff Size: Large)   Pulse 79   Temp 97.9 F (36.6 C)   Ht 6\' 1"  (1.854 m)   Wt 212 lb (96.2 kg)   SpO2 96%   BMI 27.97 kg/m    PHYSICAL EXAM:  General: Pt appears well and is in NAD  HEENT:  Eyes:  External eye exam normal without stare, lid lag or exophthalmos.  EOM intact.    Neck: General: Supple without adenopathy or carotid bruits. Thyroid: Thyroid size normal.  No goiter or nodules appreciated. No thyroid bruit.  Lungs: Clear with good BS bilat with no rales, rhonchi, or wheezes  Heart: RRR with normal S1 and S2 and no gallops; no murmurs; no rub  Abdomen: Normoactive bowel sounds, soft, nontender, without masses or organomegaly palpable  Extremities:  Lower extremities - No pretibial edema. No lesions.  Skin: Normal texture and temperature to palpation.   Neuro: MS is good with appropriate affect, pt is alert and Ox3    DM foot exam: 12/05/2019  The  skin of the feet is intact without sores or ulcerations. The pedal pulses are 1+ on right and 1+ on left. The sensation is decreased  to a screening 5.07, 10 gram monofilament bilaterally  DATA REVIEWED:  10/24/2019 Ma/Cr ratio 5 A1c 9.6%   07/22/2019 A1c 7.9% Tg 1159  BUN 17 Old records , labs and images have been reviewed.   ASSESSMENT / PLAN / RECOMMENDATIONS:   1) Type 2 Diabetes Mellitus, Sub-Optimally controlled, With Neuropathic and Macrovascular  complications - Most recent A1c of 9.6 %. Goal A1c < 7.0 %.    Plan: GENERAL: I have discussed with the patient the pathophysiology of diabetes. We went over the natural progression of the disease. We talked about both insulin resistance and insulin deficiency. We stressed the importance of lifestyle changes including diet and exercise. I explained the complications associated with diabetes including retinopathy, nephropathy, neuropathy as well as increased risk of cardiovascular disease. We went over the benefit seen with glycemic control.    I explained to the patient that diabetic patients are at higher than normal risk for amputations.   Pt with Hx of pancreatitis, not a candidate for GLp-1 agonists nor DPP-4 inhibitors  We discussed starting Glipizide, discussed  risk of hypoglycemia if taken without meals.   I have emphasized the importance of avoiding all sugar-sweetened beverages and sweets. His BG in the office today 308 mg/dL, he did not eat but had mountain dew.   MEDICATIONS: - Continue Metformin 1000 mg twice daily  - Glipizide 5 mg, 1 tablet before Breakfast and 1 tablet before supper  EDUCATION / INSTRUCTIONS:  BG monitoring instructions: Pt does not want to check BG's at this time  . I reviewed the Rule of 15 for the treatment of hypoglycemia in detail with the patient. Literature supplied.   2) Diabetic complications:   Eye: Does not have known diabetic retinopathy.   Neuro/ Feet: Does  have known diabetic peripheral neuropathy.  Renal: Patient does not have known baseline CKD. He is  on an ACEI/ARB at present.   3) Hypertriglyceridemia: TG level at 1159 mg/dL . Patient is on 80 mg of pravastatin. Discussed his high risk for pancreatitis . Has hx of alcoholic pancreatitis. Will start Fenofibrate 160 mg daily . Counseled about stopping alcohol but he states he doesn't drink much.       F/U in 3 months    Signed electronically by: Mack Guise, MD  [facility] Endocrinology  [address] [place], [place] [address] Phone: [phone] FAX: [phone]   CC: Sharilyn Sites, [place] [id number] Phone: [phone]  Fax: [phone]    Return to Endocrinology clinic as below: Future Appointments  Date Time Provider [place]  12/16/2019 11:40 AM Branch, Alphonse Guild, MD [facility]

## 2019-12-05 NOTE — Patient Instructions (Addendum)
-   Continue Metformin 1000 mg twice daily  - Glipizide 5 mg, 1 tablet before Breakfast and 1 tablet before supper - Start Fenofibrate 160 mg , 1 tablet daily ( this is for high triglycerides)      Choose healthy, lower carb lower calorie snacks: toss salad, vegetables, cottage cheese, peanut butter, low fat cheese / string cheese, lower sodium deli meat, tuna salad or chicken salad     HOW TO TREAT LOW BLOOD SUGARS (Blood sugar LESS THAN 70 MG/DL)  Please follow the RULE OF 15 for the treatment of hypoglycemia treatment (when your (blood sugars are less than 70 mg/dL)    STEP 1: Take 15 grams of carbohydrates when your blood sugar is low, which includes:   3-4 GLUCOSE TABS  OR  3-4 OZ OF JUICE OR REGULAR SODA OR  ONE TUBE OF GLUCOSE GEL     STEP 2: RECHECK blood sugar in 15 MINUTES STEP 3: If your blood sugar is still low at the 15 minute recheck --> then, go back to STEP 1 and treat AGAIN with another 15 grams of carbohydrates.

## 2019-12-06 DIAGNOSIS — E781 Pure hyperglyceridemia: Secondary | ICD-10-CM | POA: Insufficient documentation

## 2019-12-10 ENCOUNTER — Other Ambulatory Visit: Payer: Self-pay | Admitting: *Deleted

## 2019-12-10 MED ORDER — FUROSEMIDE 20 MG PO TABS: ORAL_TABLET | ORAL | 1 refills | Status: DC

## 2019-12-16 ENCOUNTER — Ambulatory Visit (INDEPENDENT_AMBULATORY_CARE_PROVIDER_SITE_OTHER): Payer: Medicare Other | Admitting: Cardiology

## 2019-12-16 ENCOUNTER — Other Ambulatory Visit: Payer: Self-pay

## 2019-12-16 ENCOUNTER — Encounter: Payer: Self-pay | Admitting: Cardiology

## 2019-12-16 VITALS — BP 130/68 | HR 67 | Ht 73.0 in | Wt 215.0 lb

## 2019-12-16 DIAGNOSIS — I2510 Atherosclerotic heart disease of native coronary artery without angina pectoris: Secondary | ICD-10-CM | POA: Diagnosis not present

## 2019-12-16 DIAGNOSIS — E782 Mixed hyperlipidemia: Secondary | ICD-10-CM

## 2019-12-16 MED ORDER — LISINOPRIL 2.5 MG PO TABS: ORAL_TABLET | ORAL | 1 refills | Status: DC

## 2019-12-16 MED ORDER — PRAVASTATIN SODIUM 80 MG PO TABS: 80.0000 mg | ORAL_TABLET | Freq: Every evening | ORAL | 1 refills | Status: DC

## 2019-12-16 MED ORDER — FUROSEMIDE 20 MG PO TABS: ORAL_TABLET | ORAL | 1 refills | Status: DC

## 2019-12-16 MED ORDER — CARVEDILOL 3.125 MG PO TABS: ORAL_TABLET | ORAL | 1 refills | Status: DC

## 2019-12-16 MED ORDER — ISOSORBIDE MONONITRATE ER 60 MG PO TB24: ORAL_TABLET | ORAL | 1 refills | Status: DC

## 2019-12-16 NOTE — Progress Notes (Signed)
Clinical Summary [name] is a 54 y.o.male seen today for follow up of the following medical problems.   1. CAD - admit 03/2014 with inferior STEMI, received BMS to RCA. LVEF 45-50% by LV gram. 02/2017 nuclear stress: mild apical ischemia vs artifact. Overall low risk -02/2017 nuclear stress: inferior ischemia 04/2017 cath: patent coronaries, patent RCA stent. Elevated LVEDP 26  - dull pain mid chest to right side. 2-3/10 in severity. Seems to occur with activity. No recent SOB/DOE. Lasts just a few seconds. Occurs about once a week  - since last visit symptoms have resolved. Denies any recent symptoms   2. Hyperlipidemia - tolerates pravastatin, reports prior myaglias on a prior statins - elevated TGs by last labs. Was to have repeat labs but does not appear this was done. Most recent labs he reports he had coffee with whole milk prior to labs, was not a true fasting lab - muscle aches on gemfibrozil in the past, tolerated fenofibrate but had some financial issues with costs of medicines about 5 years ago and medication was stopped  -we have tried after the last several visits to get a repeat lipid panel however he has not had completed      Past Medical History:  Diagnosis Date  . Anxiety   . Borderline diabetes   . Bulging disc   . Chronic back pain   . Closed intra-articular fracture of distal end of left tibia   . Diabetes mellitus without complication ([place])   . Hypercholesteremia   . Myocardial infarction ([place])   . Pancreatitis      No Known Allergies   Current Outpatient Medications  Medication Sig Dispense Refill  . aspirin 81 MG chewable tablet Chew 1 tablet (81 mg total) by mouth daily.    . carvedilol (COREG) 3.125 MG tablet TAKE 1 TABLET(3.125 MG) BY MOUTH TWICE DAILY WITH A MEAL 180 tablet 1  . fenofibrate 160 MG tablet Take 1 tablet (160 mg total) by mouth daily. 30 tablet 6  . furosemide (LASIX) 20 MG tablet DAILY AS NEEDED 90 tablet 1  .  glipiZIDE (GLUCOTROL) 5 MG tablet Take 1 tablet (5 mg total) by mouth 2 (two) times daily before a meal. 180 tablet 2  . ibuprofen (ADVIL,MOTRIN) 200 MG tablet Take 800 mg by mouth every 6 (six) hours as needed for fever, headache or mild pain.     . isosorbide mononitrate (IMDUR) 60 MG 24 hr tablet TAKE 1 TABLET(60 MG) BY MOUTH DAILY 90 tablet 1  . lisinopril (ZESTRIL) 2.5 MG tablet TAKE 1 TABLET(2.5 MG) BY MOUTH DAILY 90 tablet 1  . meloxicam (MOBIC) 15 MG tablet Take 1 tablet by mouth daily.    . metFORMIN (GLUCOPHAGE) 1000 MG tablet Take 1,000 mg by mouth 2 (two) times daily.    . nitroGLYCERIN (NITROSTAT) 0.4 MG SL tablet Place 1 tablet (0.4 mg total) under the tongue every 5 (five) minutes as needed for chest pain. 25 tablet 3  . pravastatin (PRAVACHOL) 80 MG tablet Take 1 tablet (80 mg total) by mouth every evening. 90 tablet 1  . zolpidem (AMBIEN) 10 MG tablet Take 10 mg by mouth at bedtime as needed.     No current facility-administered medications for this visit.     Past Surgical History:  Procedure Laterality Date  . ABDOMINAL SURGERY    . APPENDECTOMY    . BACK SURGERY    . CARDIAC CATHETERIZATION  2003   "trivial coronary artery disease," normal  EF  . COLONOSCOPY N/A 04/16/2015   Procedure: COLONOSCOPY;  Surgeon: Daneil Dolin, MD;  [facility];  Service: Endoscopy;  Laterality: N/A;  12:45 PM  . COLONOSCOPY WITH PROPOFOL N/A 06/28/2018   Procedure: COLONOSCOPY WITH PROPOFOL;  Surgeon: Daneil Dolin, MD;  [facility];  Service: Endoscopy;  Laterality: N/A;  9:45am  . LEFT HEART CATH AND CORONARY ANGIOGRAPHY N/A 05/09/2017   Procedure: LEFT HEART CATH AND CORONARY ANGIOGRAPHY;  Surgeon: Leonie Man, MD;  Location: [place] [facility];  Service: Cardiovascular;  Laterality: N/A;  . LEFT HEART CATHETERIZATION WITH CORONARY ANGIOGRAM N/A 03/13/2014   Procedure: LEFT HEART CATHETERIZATION WITH CORONARY ANGIOGRAM;  Surgeon: Leonie Man, MD;   Location[facility] C[facility];  Service: Cardiovascular;  Laterality: N/A;  . OPEN REDUCTION INTERNAL FIXATION (ORIF) TIBIA/FIBULA FRACTURE Left 10/01/2018   Procedure: OPEN REDUCTION INTERNAL FIXATION LEFT PILON FRACTURE;  Surgeon: Altamese Squir[place], MD;  Location: [place];  Service: Orthopedics;  Laterality: Left;  . POLYPECTOMY  06/28/2018   Procedure: POLYPECTOMY;  Surgeon: Daneil Dolin, MD;  [facility];  Service: Endoscopy;;  (colon)     No Known Allergies    Family History  Problem Relation Age of Onset  . Diabetes Father   . Hyperlipidemia Father   . Hypertension Father   . Hypertension Mother   . Hyperlipidemia Mother   . Hyperlipidemia Sister   . Hypertension Sister   . Diabetes Sister      Social History [name] reports that he has been smoking cigarettes. He started smoking about 42 years ago. He has a 15.00 pack-year smoking history. He has never used smokeless tobacco. [name] reports current alcohol use of about 6.0 standard drinks of alcohol per week.   Review of Systems CONSTITUTIONAL: No weight loss, fever, chills, weakness or fatigue.  HEENT: Eyes: No visual loss, blurred vision, double vision or yellow sclerae.No hearing loss, sneezing, congestion, runny nose or sore throat.  SKIN: No rash or itching.  CARDIOVASCULAR: per hpi RESPIRATORY: No shortness of breath, cough or sputum.  GASTROINTESTINAL: No anorexia, nausea, vomiting or diarrhea. No abdominal pain or blood.  GENITOURINARY: No burning on urination, no polyuria NEUROLOGICAL: No headache, dizziness, syncope, paralysis, ataxia, numbness or tingling in the extremities. No change in bowel or bladder control.  MUSCULOSKELETAL: No muscle, back pain, joint pain or stiffness.  LYMPHATICS: No enlarged nodes. No history of splenectomy.  PSYCHIATRIC: No history of depression or anxiety.  ENDOCRINOLOGIC: No reports of sweating, cold or heat intolerance. No polyuria or polydipsia.  Marland Kitchen   Physical  Examination Today's Vitals   12/16/19 1139  BP: 130/68  Pulse: 67  SpO2: 95%  Weight: 215 lb (97.5 kg)  Height: 6\' 1"  (1.854 m)   Body mass index is 28.37 kg/m.  Gen: resting comfortably, no acute distress HEENT: no scleral icterus, pupils equal round and reactive, no palptable cervical adenopathy,  CV: RRR, no m/r/g, no jvd Resp: Clear to auscultation bilaterally GI: abdomen is soft, non-tender, non-distended, normal bowel sounds, no hepatosplenomegaly MSK: extremities are warm, no edema.  Skin: warm, no rash Neuro:  no focal deficits Psych: appropriate affect   Diagnostic Studies  02/2017 nuclear stress  No diagnostic ST segment changes to indicate ischemia.  Small, mild intensity, apical to basal inferior defect is partially reversible and suggestive of ischemia, although there is gut radiotracer uptake artifact adjacent to the inferior wall on rest imaging which could also be contributing to the  defect reversibility.  This is a low risk study.  Nuclear stress EF: 63%.   04/2017 cath   Angiographically normal coronary arteries  Dist RCA BMS (VeriFlex 5.0 x 15), 0 %stenosed.  The left ventricular systolic function is normal. The left ventricular ejection fraction is 50-55% by visual estimate.  LV end diastolic pressure is moderately elevated.  Patient essentially has intra-graphic normal coronary arteries with widely RCA. No culprit lesion to explain inferior ischemia. Elevated LVEDP could explain microvascular ischemia.  Otherwise consider non-anginal etiology for chest pain.   Plan: Discharge home today after bed rest with TR band removal.   Assessment and Plan  1. CAD - no recent symptoms, continue current meds   2. Hyperlipidemia -very high TGs by Jan labs but had coffee with whole milk just prior. He did not go for the repeat labs we ordered last visit -has not gone for repeat labs after ordering after our last several visits, encouraged  him to get repeat labs so we can appropriately managed his hyperlipidemia.      Arnoldo Lenis, M.D.

## 2019-12-16 NOTE — Patient Instructions (Signed)

## 2020-01-29 ENCOUNTER — Encounter: Payer: Self-pay | Admitting: Internal Medicine

## 2020-01-31 ENCOUNTER — Ambulatory Visit: Payer: Medicare Other | Admitting: Dietician

## 2020-02-05 ENCOUNTER — Other Ambulatory Visit: Payer: Self-pay

## 2020-02-05 ENCOUNTER — Emergency Department (HOSPITAL_COMMUNITY)
Admission: EM | Admit: 2020-02-05 | Discharge: 2020-02-06 | Disposition: A | Discharge status: Home / Self Care | Payer: Medicare Other | Attending: Emergency Medicine | Admitting: Emergency Medicine

## 2020-02-05 ENCOUNTER — Ambulatory Visit (INDEPENDENT_AMBULATORY_CARE_PROVIDER_SITE_OTHER): Payer: Medicare Other | Admitting: Gastroenterology

## 2020-02-05 ENCOUNTER — Telehealth: Payer: Self-pay | Admitting: Internal Medicine

## 2020-02-05 ENCOUNTER — Encounter: Payer: Self-pay | Admitting: Gastroenterology

## 2020-02-05 ENCOUNTER — Encounter (HOSPITAL_COMMUNITY): Payer: Self-pay | Admitting: *Deleted

## 2020-02-05 DIAGNOSIS — R109 Unspecified abdominal pain: Secondary | ICD-10-CM | POA: Diagnosis not present

## 2020-02-05 DIAGNOSIS — Z7984 Long term (current) use of oral hypoglycemic drugs: Secondary | ICD-10-CM | POA: Insufficient documentation

## 2020-02-05 DIAGNOSIS — M545 Low back pain, unspecified: Secondary | ICD-10-CM

## 2020-02-05 DIAGNOSIS — Z7982 Long term (current) use of aspirin: Secondary | ICD-10-CM | POA: Insufficient documentation

## 2020-02-05 DIAGNOSIS — Z79899 Other long term (current) drug therapy: Secondary | ICD-10-CM | POA: Diagnosis not present

## 2020-02-05 DIAGNOSIS — R1010 Upper abdominal pain, unspecified: Secondary | ICD-10-CM | POA: Insufficient documentation

## 2020-02-05 DIAGNOSIS — I2510 Atherosclerotic heart disease of native coronary artery without angina pectoris: Secondary | ICD-10-CM

## 2020-02-05 DIAGNOSIS — I252 Old myocardial infarction: Secondary | ICD-10-CM | POA: Diagnosis not present

## 2020-02-05 DIAGNOSIS — R634 Abnormal weight loss: Secondary | ICD-10-CM

## 2020-02-05 DIAGNOSIS — K5900 Constipation, unspecified: Secondary | ICD-10-CM | POA: Insufficient documentation

## 2020-02-05 DIAGNOSIS — F17210 Nicotine dependence, cigarettes, uncomplicated: Secondary | ICD-10-CM | POA: Diagnosis not present

## 2020-02-05 DIAGNOSIS — E119 Type 2 diabetes mellitus without complications: Secondary | ICD-10-CM | POA: Diagnosis not present

## 2020-02-05 DIAGNOSIS — Z955 Presence of coronary angioplasty implant and graft: Secondary | ICD-10-CM | POA: Insufficient documentation

## 2020-02-05 DIAGNOSIS — R110 Nausea: Secondary | ICD-10-CM | POA: Diagnosis not present

## 2020-02-05 LAB — URINALYSIS, ROUTINE W REFLEX MICROSCOPIC
Bacteria, UA: NONE SEEN
Bilirubin Urine: NEGATIVE
Glucose, UA: >=500 mg/dL — AB
Hgb urine dipstick: NEGATIVE
Ketones, ur: NEGATIVE mg/dL
Leukocytes,Ua: NEGATIVE
Nitrite: NEGATIVE
Protein, ur: NEGATIVE mg/dL
Specific Gravity, Urine: 1.031 — ABNORMAL HIGH (ref 1.005–1.030)
pH: 5 (ref 5.0–8.0)

## 2020-02-05 MED ORDER — LUBIPROSTONE 24 MCG PO CAPS: 24.0000 ug | ORAL_CAPSULE | Freq: Two times a day (BID) | ORAL | 3 refills | Status: DC

## 2020-02-05 NOTE — Patient Instructions (Signed)
1. Start Amitiza 98mcg twice daily with food to help regulate your bowel movements. Can back down to once daily if having too frequent stools.  2. I will review labs from PCP and then make additional recommendations

## 2020-02-05 NOTE — Progress Notes (Signed)
Primary Care Physician: Sharilyn Sites, MD  Primary Gastroenterologist:  [facility] Cornea, MD   Chief Complaint  Patient presents with  . Constipation    HPI: [name] is a 54 y.o. male here at the request of Dr. Hilma Favors for further evaluation of abdominal pain.  Patient was seen back in October 2019 for history of adenomatous colon polyps.  Colonoscopy completed December 2019, 2 tubular adenomas removed.  Also with diverticulosis.  Next colonoscopy planned in 5 years.  Patient also has a history of suspected alcohol related pancreatitis on a couple of occasions in 2019/2020.  Complains of 68-month history of change in bowel habits and abdominal pain.  States symptoms started after he drank 2 beers 2 months ago.  He had laid off the alcohol for 6 to 8 months before this. Epigastric pain goes up into chest associated nausea every 3-4 days.  Upper abdominal pain seems to be worse the longer he goes without a bowel movement.  After taking milk of magnesia copiously have runny stools few hours later.  His abdominal pain resolved. Diminished appetite. Weight down 7 pounds in the past month. Before all this, BM twice per day.  Denies any dietary changes or medication changes.  He has been on chronic pain medications for 5 years, takes oxycodone about twice daily.  Admits to limited fruit and veggies in his diet.  Consumes at least 6-7 bottles of water daily.   No bcs/goody's powders.  Takes ibuprofen as needed for headache or mild pain.  Patient reports having a bowel obstruction years ago requiring surgery for lysis of adhesions by Dr. Romona Curls.  Patient reports recent labs from PCP showed no evidence of pancreatitis.  We requested those records.  He also had CT imaging at [facility].  See below.  CT chest without contrast for lung cancer screening dated Nov 29, 2019: Centrilobular emphysema, smoking-related respiratory bronchiolitis, lung RADS 1, negative recommending follow-up chest  CT without contrast in 12 months.  CT abdomen pelvis with contrast dated February 08, 2019: Mild acute pancreatitis without complications.  Current Outpatient Medications  Medication Sig Dispense Refill  . aspirin 81 MG chewable tablet Chew 1 tablet (81 mg total) by mouth daily.    . carvedilol (COREG) 3.125 MG tablet TAKE 1 TABLET(3.125 MG) BY MOUTH TWICE DAILY WITH A MEAL 180 tablet 1  . furosemide (LASIX) 20 MG tablet DAILY AS NEEDED 90 tablet 1  . ibuprofen (ADVIL,MOTRIN) 200 MG tablet Take 800 mg by mouth every 6 (six) hours as needed for fever, headache or mild pain.     . isosorbide mononitrate (IMDUR) 60 MG 24 hr tablet TAKE 1 TABLET(60 MG) BY MOUTH DAILY 90 tablet 1  . lisinopril (ZESTRIL) 2.5 MG tablet TAKE 1 TABLET(2.5 MG) BY MOUTH DAILY 90 tablet 1  . metFORMIN (GLUCOPHAGE) 1000 MG tablet Take 1,000 mg by mouth 2 (two) times daily.    . nitroGLYCERIN (NITROSTAT) 0.4 MG SL tablet Place 1 tablet (0.4 mg total) under the tongue every 5 (five) minutes as needed for chest pain. 25 tablet 3  . pravastatin (PRAVACHOL) 80 MG tablet Take 1 tablet (80 mg total) by mouth every evening. 90 tablet 1  . zolpidem (AMBIEN) 10 MG tablet Take 10 mg by mouth at bedtime as needed.    Marland Kitchen oxyCODONE-acetaminophen (PERCOCET/ROXICET) 5-325 MG tablet Take 1-2 tablets by mouth every 4 (four) hours as needed.     No current facility-administered medications for this visit.  Allergies as of 02/05/2020  . (No Known Allergies)    ROS:  General: Negative for fever, chills, fatigue, weakness.  See HPI ENT: Negative for hoarseness, difficulty swallowing , nasal congestion. CV: Negative for chest pain, angina, palpitations, dyspnea on exertion, peripheral edema.  Respiratory: Negative for dyspnea at rest, dyspnea on exertion, cough, sputum, wheezing.  GI: See history of present illness. GU:  Negative for dysuria, hematuria, urinary incontinence, urinary frequency, nocturnal urination.  Endo: See HPI     Physical Examination:   BP (!) 104/58   Pulse 72   Temp 97.8 F (36.6 C) (Oral)   Ht 6\' 1"  (1.854 m)   Wt (!) 208 lb (94.3 kg)   BMI 27.44 kg/m   General: Well-nourished, well-developed in no acute distress.  Eyes: No icterus. Mouth:masked Lungs: Clear to auscultation bilaterally.  Heart: Regular rate and rhythm, no murmurs rubs or gallops.  Abdomen: Bowel sounds are normal, nontender, nondistended, no hepatosplenomegaly or masses, no abdominal bruits or hernia , no rebound or guarding.   Extremities: No lower extremity edema. No clubbing or deformities. Neuro: Alert and oriented x 4   Skin: Warm and dry, no jaundice.   Psych: Alert and cooperative, normal mood and affect.    Impression/plan:  Pleasant 54 year old gentleman presenting for further evaluation of constipation, weight loss and abdominal pain.  Chronically on oxycodone for 4 to 5 years.  No recent changes in medication.  Complains of upper abdominal pain associated with nausea which worsens the longer he goes without a bowel movement, symptoms resolved after a bowel movement.  Remotely with history of bowel obstruction requiring lysis of adhesions based on his description.  He has had a couple episodes of uncomplicated pancreatitis related to alcohol use as previously outlined.  Last CT imaging July 2020.  Patient reports last labs showed no indications of acute pancreatitis.  Colonoscopy up-to-date.  Initially treat constipation.  Add Amitiza 24 mcg twice daily with food.  If he continues to have abdominal pain, nausea after adequate management of constipation, he will likely need to have repeat CT imaging to rule out complications from recurrent pancreatitis, pancreatic pseudocyst formation, malignancy.I doubt we are dealing with bowel obstruction.  Given significant epigastric pain associate with nausea, cannot exclude possibility of gastritis, GERD, peptic ulcer disease.  Obtain most recent labs for review.  Further  recommendations to follow.

## 2020-02-05 NOTE — Telephone Encounter (Signed)
Pt was seen today by [facility] and called back to say that the prescription was going to cost him $500. Is there something else we can recommend? [phone]

## 2020-02-05 NOTE — ED Triage Notes (Signed)
Pt c/o right flank pain for the last few days; pt is unsure if it's a kidney stone; pt states he has been using icy hot with no relief; pt denies any urinary sx

## 2020-02-06 ENCOUNTER — Emergency Department (HOSPITAL_COMMUNITY): Payer: Medicare Other

## 2020-02-06 DIAGNOSIS — M545 Low back pain: Secondary | ICD-10-CM | POA: Diagnosis not present

## 2020-02-06 LAB — COMPREHENSIVE METABOLIC PANEL
ALT: 45 U/L — ABNORMAL HIGH (ref 0–44)
AST: 42 U/L — ABNORMAL HIGH (ref 15–41)
Albumin: 4 g/dL (ref 3.5–5.0)
Alkaline Phosphatase: 96 U/L (ref 38–126)
Anion gap: 9 (ref 5–15)
BUN: 12 mg/dL (ref 6–20)
CO2: 27 mmol/L (ref 22–32)
Calcium: 8.8 mg/dL — ABNORMAL LOW (ref 8.9–10.3)
Chloride: 93 mmol/L — ABNORMAL LOW (ref 98–111)
Creatinine, Ser: 0.66 mg/dL (ref 0.61–1.24)
GFR calc Af Amer: >60 mL/min (ref >60)
GFR calc non Af Amer: >60 mL/min (ref >60)
Glucose, Bld: 313 mg/dL — ABNORMAL HIGH (ref 70–99)
Potassium: 3.9 mmol/L (ref 3.5–5.1)
Sodium: 129 mmol/L — ABNORMAL LOW (ref 135–145)
Total Bilirubin: 1 mg/dL (ref 0.3–1.2)
Total Protein: 6.3 g/dL — ABNORMAL LOW (ref 6.5–8.1)

## 2020-02-06 LAB — CBC WITH DIFFERENTIAL/PLATELET
Abs Immature Granulocytes: 0.05 10*3/uL (ref 0.00–0.07)
Basophils Absolute: 0.1 10*3/uL (ref 0.0–0.1)
Basophils Relative: 1 %
Eosinophils Absolute: 0.2 10*3/uL (ref 0.0–0.5)
Eosinophils Relative: 2 %
HCT: 45.2 % (ref 39.0–52.0)
Hemoglobin: 16.3 g/dL (ref 13.0–17.0)
Immature Granulocytes: 1 %
Lymphocytes Relative: 36 %
Lymphs Abs: 3.2 10*3/uL (ref 0.7–4.0)
MCH: 33 pg (ref 26.0–34.0)
MCHC: 36.1 g/dL — ABNORMAL HIGH (ref 30.0–36.0)
MCV: 91.5 fL (ref 80.0–100.0)
Monocytes Absolute: 0.5 10*3/uL (ref 0.1–1.0)
Monocytes Relative: 6 %
Neutro Abs: 4.9 10*3/uL (ref 1.7–7.7)
Neutrophils Relative %: 54 %
Platelets: 148 10*3/uL — ABNORMAL LOW (ref 150–400)
RBC: 4.94 MIL/uL (ref 4.22–5.81)
RDW: 13.2 % (ref 11.5–15.5)
WBC: 8.8 10*3/uL (ref 4.0–10.5)
nRBC: 0 % (ref 0.0–0.2)

## 2020-02-06 LAB — ACETAMINOPHEN LEVEL: Acetaminophen (Tylenol), Serum: <10 ug/mL — ABNORMAL LOW (ref 10–30)

## 2020-02-06 LAB — TROPONIN I (HIGH SENSITIVITY)
Troponin I (High Sensitivity): 4 ng/L (ref <18)
Troponin I (High Sensitivity): 4 ng/L (ref <18)

## 2020-02-06 LAB — LIPASE, BLOOD: Lipase: 35 U/L (ref 11–51)

## 2020-02-06 MED ORDER — KETOROLAC TROMETHAMINE 30 MG/ML IJ SOLN
30.0000 mg | Freq: Once | INTRAMUSCULAR | Status: AC
  Administered 2020-02-06: 30 mg via INTRAVENOUS
  Filled 2020-02-06: qty 1

## 2020-02-06 MED ORDER — ONDANSETRON HCL 4 MG/2ML IJ SOLN
4.0000 mg | Freq: Once | INTRAMUSCULAR | Status: AC
  Administered 2020-02-06: 4 mg via INTRAVENOUS
  Filled 2020-02-06: qty 2

## 2020-02-06 MED ORDER — HYDROMORPHONE HCL 1 MG/ML IJ SOLN
1.0000 mg | Freq: Once | INTRAMUSCULAR | Status: AC
  Administered 2020-02-06: 1 mg via INTRAVENOUS
  Filled 2020-02-06: qty 1

## 2020-02-06 NOTE — Discharge Instructions (Signed)
As we discussed, it is possible you may have passed a kidney stone.  Your urinalysis today is negative however.  The ED cannot provide chronic pain medication for you.  Follow-up with your doctor.  Return to the ED with new or worsening symptoms.

## 2020-02-06 NOTE — ED Provider Notes (Signed)
[facility] Provider Note   CSN: [id number] Arrival date & time: 02/05/20  1524     History Chief Complaint  Patient presents with  . Back Pain    [name] is a 54 y.o. male.  Patient with history of chronic back pain on Percocet, diabetes, previous MI, pancreatitis presenting with a 2-week history of left flank pain and low back pain.  States the pain comes and goes and does not feel like his chronic back pain.  He normally takes Percocet for this but is out.  Denies any radiation of the pain down his leg.  No focal weakness, numbness or tingling.  No bowel or bladder incontinence.  No fever or vomiting.  No pain with urination or blood in the urine.  No chest pain or shortness of breath.  States he is not had this kind of pain in the past but believes it could be a kidney stone.  Contrary to triage note it is the left flank and not the right.  Reports he had 2 back surgeries in the past but does not normally have pain like this.  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  The history is provided by the patient.  Back Pain Associated symptoms: abdominal pain   Associated symptoms: no dysuria, no fever, no headaches and no weakness        Past Medical History:  Diagnosis Date  . Anxiety   . Borderline diabetes   . Bulging disc   . Chronic back pain   . Closed intra-articular fracture of distal end of left tibia   . Diabetes mellitus without complication ([place])   . Hypercholesteremia   . Myocardial infarction ([place])   . Pancreatitis     Patient Active Problem List   Diagnosis Date Noted  . Upper abdominal pain 02/05/2020  . Abnormal weight loss 02/05/2020  . Constipation 02/05/2020  . Hypertriglyceridemia 12/06/2019  . Fracture of tibial shaft, left, closed 10/02/2018  . Nicotine dependence 10/02/2018  . Closed intra-articular fracture of distal end of left tibia   .  Type 2 diabetes mellitus with hemoglobin A1c goal of less than 7.5% (HCC)   . Pre-op chest exam   . Closed fracture of left distal fibula 09/29/2018  . Angina, class II ([place]) - Low Risk ST, but persistent chest pain. 05/08/2017  . History of colonic polyps   . Diverticulosis of colon without hemorrhage   . ST-segment elevation myocardial infarction (STEMI) of inferior wall ([place]) 03/14/2014  . Presence of bare metal stent in right coronary artery: VeriFlex BMS 5.0 mm x 15 mm (5.6 mm) 03/14/2014    Class: Status post  . CAD S/P percutaneous coronary angioplasty 03/14/2014  . Chest pain 03/13/2014  . Pancreatitis 01/19/2014  . Hyperlipidemia associated with type 2 diabetes mellitus ([place]) 01/19/2014  . Diabetes mellitus ([employer]) 01/19/2014    Past Surgical History:  Procedure Laterality Date  . ABDOMINAL SURGERY  1990   adhesions, blockage.   . APPENDECTOMY    . BACK SURGERY    . CARDIAC CATHETERIZATION  2003   "trivial coronary artery disease," normal EF  . COLONOSCOPY N/A 04/16/2015   Procedure: COLONOSCOPY;  Surgeon: Daneil Dolin, MD;  Location: [facility];  Service: Endoscopy;  Laterality: N/A;  12:45 PM  . COLONOSCOPY WITH PROPOFOL N/A 06/28/2018   [place]: Diverticulosis, 2 tubular adenomas removed.  Next colonoscopy in 5 years.  Marland Kitchen  LEFT HEART CATH AND CORONARY ANGIOGRAPHY N/A 05/09/2017   Procedure: LEFT HEART CATH AND CORONARY ANGIOGRAPHY;  Surgeon: Leonie Man, MD;  Location: [facility];  Service: Cardiovascular;  Laterality: N/A;  . LEFT HEART CATHETERIZATION WITH CORONARY ANGIOGRAM N/A 03/13/2014   Procedure: LEFT HEART CATHETERIZATION WITH CORONARY ANGIOGRAM;  Surgeon: Leonie Man, MD;  Location: [facility] CATH LAB;  Service: Cardiovascular;  Laterality: N/A;  . OPEN REDUCTION INTERNAL FIXATION (ORIF) TIBIA/FIBULA FRACTURE Left 10/01/2018   Procedure: OPEN REDUCTION INTERNAL FIXATION LEFT PILON FRACTURE;  Surgeon: Altamese Enoree, MD;  Location: [facility];  Service:  Orthopedics;  Laterality: Left;  . POLYPECTOMY  06/28/2018   Procedure: POLYPECTOMY;  Surgeon: Daneil Dolin, MD;  Location: [facility];  Service: Endoscopy;;  (colon)       Family History  Problem Relation Age of Onset  . Diabetes Father   . Hyperlipidemia Father   . Hypertension Father   . Hypertension Mother   . Hyperlipidemia Mother   . Hyperlipidemia Sister   . Hypertension Sister   . Diabetes Sister     Social History   Tobacco Use  . Smoking status: Current Every Day Smoker    Packs/day: 0.50    Years: 30.00    Pack years: 15.00    Types: Cigarettes    Start date: 10/25/1977  . Smokeless tobacco: Never Used  Substance Use Topics  . Alcohol use: Yes    Alcohol/week: 6.0 standard drinks    Types: 6 Cans of beer per week  . Drug use: No    Home Medications Prior to Admission medications   Medication Sig Start Date End Date Taking? Authorizing Provider  aspirin 81 MG chewable tablet Chew 1 tablet (81 mg total) by mouth daily. 03/15/14   Eileen Stanford, PA-C  carvedilol (COREG) 3.125 MG tablet TAKE 1 TABLET(3.125 MG) BY MOUTH TWICE DAILY WITH A MEAL 12/16/19   Arnoldo Lenis, MD  furosemide (LASIX) 20 MG tablet DAILY AS NEEDED 12/16/19   Arnoldo Lenis, MD  ibuprofen (ADVIL,MOTRIN) 200 MG tablet Take 800 mg by mouth every 6 (six) hours as needed for fever, headache or mild pain.     [provider]  isosorbide mononitrate (IMDUR) 60 MG 24 hr tablet TAKE 1 TABLET(60 MG) BY MOUTH DAILY 12/16/19   Arnoldo Lenis, MD  lisinopril (ZESTRIL) 2.5 MG tablet TAKE 1 TABLET(2.5 MG) BY MOUTH DAILY 12/16/19   Arnoldo Lenis, MD  lubiprostone (AMITIZA) 24 MCG capsule Take 1 capsule (24 mcg total) by mouth 2 (two) times daily with a meal. 02/05/20   Mahala Menghini, PA-C  metFORMIN (GLUCOPHAGE) 1000 MG tablet Take 1,000 mg by mouth 2 (two) times daily. 03/28/19   [provider]  nitroGLYCERIN (NITROSTAT) 0.4 MG SL tablet Place 1 tablet (0.4 mg total)  under the tongue every 5 (five) minutes as needed for chest pain. 03/02/17   Arnoldo Lenis, MD  oxyCODONE-acetaminophen (PERCOCET/ROXICET) 5-325 MG tablet Take 1-2 tablets by mouth every 4 (four) hours as needed. 01/27/20   [provider]  pravastatin (PRAVACHOL) 80 MG tablet Take 1 tablet (80 mg total) by mouth every evening. 12/16/19 03/15/20  Arnoldo Lenis, MD  zolpidem (AMBIEN) 10 MG tablet Take 10 mg by mouth at bedtime as needed. 07/28/19   [provider]    Allergies    Patient has no known allergies.  Review of Systems   Review of Systems  Constitutional: Negative for activity change, appetite  change, fatigue and fever.  HENT: Negative for congestion and rhinorrhea.   Respiratory: Negative for cough, chest tightness and shortness of breath.   Gastrointestinal: Positive for abdominal pain and nausea. Negative for vomiting.  Genitourinary: Negative for dysuria and hematuria.  Musculoskeletal: Positive for back pain. Negative for arthralgias and myalgias.  Neurological: Negative for dizziness, weakness and headaches.   all other systems are negative except as noted in the HPI and PMH.    Physical Exam Updated Vital Signs BP 118/77 (BP Location: Right Arm)   Pulse 96   Temp 98 F (36.7 C) (Oral)   Resp 16   SpO2 97%   Physical Exam Vitals and nursing note reviewed.  Constitutional:      General: He is not in acute distress.    Appearance: He is well-developed.  HENT:     Head: Normocephalic and atraumatic.     Mouth/Throat:     Pharynx: No oropharyngeal exudate.  Eyes:     Conjunctiva/sclera: Conjunctivae normal.     Pupils: Pupils are equal, round, and reactive to light.  Neck:     Comments: No meningismus. Cardiovascular:     Rate and Rhythm: Normal rate and regular rhythm.     Heart sounds: Normal heart sounds. No murmur heard.   Pulmonary:     Effort: Pulmonary effort is normal. No respiratory distress.     Breath sounds: Normal breath  sounds.  Abdominal:     Palpations: Abdomen is soft.     Tenderness: There is no abdominal tenderness. There is no guarding or rebound.     Comments: surgical incision well-healed  Musculoskeletal:        General: Tenderness present. Normal range of motion.     Cervical back: Normal range of motion and neck supple.     Comments: Left CVA tenderness, no midline tenderness, surgical incision well-healed  .5/5 strength in bilateral lower extremities. Ankle plantar and dorsiflexion intact. Great toe extension intact bilaterally. +2 DP and PT pulses. +2 patellar reflexes bilaterally. Normal gait.   Skin:    General: Skin is warm.  Neurological:     Mental Status: He is alert and oriented to person, place, and time.     Cranial Nerves: No cranial nerve deficit.     Motor: No abnormal muscle tone.     Coordination: Coordination normal.     Comments: No ataxia on finger to nose bilaterally. No pronator drift. 5/5 strength throughout. CN 2-12 intact.Equal grip strength. Sensation intact.   Psychiatric:        Behavior: Behavior normal.     ED Results / Procedures / Treatments   Labs (all labs ordered are listed, but only abnormal results are displayed) Labs Reviewed  URINALYSIS, ROUTINE W REFLEX MICROSCOPIC - Abnormal; Notable for the following components:      Result Value   Specific Gravity, Urine 1.031 (*)    Glucose, UA >=500 (*)    All other components within normal limits  CBC WITH DIFFERENTIAL/PLATELET - Abnormal; Notable for the following components:   MCHC 36.1 (*)    Platelets 148 (*)    All other components within normal limits  COMPREHENSIVE METABOLIC PANEL - Abnormal; Notable for the following components:   Sodium 129 (*)    Chloride 93 (*)    Glucose, Bld 313 (*)    Calcium 8.8 (*)    Total Protein 6.3 (*)    AST 42 (*)    ALT 45 (*)    All other  components within normal limits  ACETAMINOPHEN LEVEL - Abnormal; Notable for the following components:   Acetaminophen  (Tylenol), Serum <10 (*)    All other components within normal limits  LIPASE, BLOOD  TROPONIN I (HIGH SENSITIVITY)  TROPONIN I (HIGH SENSITIVITY)    EKG EKG Interpretation  Date/Time:  Thursday February 06 2020 00:44:55 EDT Ventricular Rate:  64 PR Interval:  198 QRS Duration: 84 QT Interval:  418 QTC Calculation: 431 R Axis:   44 Text Interpretation: Normal sinus rhythm Normal ECG No significant change was found Confirmed by Ezequiel Essex [phone]) on 02/06/2020 1:06:48 AM   Radiology CT Renal Stone Study  Result Date: 02/06/2020 CLINICAL DATA:  Right-sided flank pain EXAM: CT ABDOMEN AND PELVIS WITHOUT CONTRAST TECHNIQUE: Multidetector CT imaging of the abdomen and pelvis was performed following the standard protocol without IV contrast. COMPARISON:  02/07/2019 FINDINGS: Lower chest: No acute abnormality. Hepatobiliary: No focal liver abnormality is seen. No gallstones, gallbladder wall thickening, or biliary dilatation. Pancreas: Unremarkable. No pancreatic ductal dilatation or surrounding inflammatory changes. Spleen: Normal in size without focal abnormality. Adrenals/Urinary Tract: Adrenal glands are within normal limits. Kidneys demonstrate a normal appearance bilaterally. No renal calculi are noted. No obstructive changes are seen. The bladder is decompressed. Stomach/Bowel: Colon shows scattered diverticular change without evidence of diverticulitis. No obstructive changes are noted. The appendix has been surgically removed. Small bowel and stomach appear within normal limits. Vascular/Lymphatic: Aortic atherosclerosis. No enlarged abdominal or pelvic lymph nodes. Reproductive: Prostate is unremarkable. Other: No abdominal wall hernia or abnormality. No abdominopelvic ascites. Musculoskeletal: Degenerative changes of the lumbar spine are noted. IMPRESSION: Diverticulosis without diverticulitis. No other focal abnormality is noted. Electronically Signed   By: [name] M.D.   On:  02/06/2020 00:46    Procedures Procedures (including critical care time)  Medications Ordered in ED Medications  HYDROmorphone (DILAUDID) injection 1 mg (has no administration in time range)  ondansetron (ZOFRAN) injection 4 mg (has no administration in time range)    ED Course  I have reviewed the triage vital signs and the nursing notes.  Pertinent labs & imaging results that were available during my care of the patient were reviewed by me and considered in my medical decision making (see chart for details).    MDM Rules/Calculators/A&P                         Left flank pain for the past several weeks.  No radiation of pain down leg. Does not seem consistent with sciatica. Intact distal strength, sensation and pulses and reflexes.  Low suspicion for cord compression or cauda equina.  Urinalysis is negative.  Labs with minimal LFT elevation. Lipase normal. Troponin negative.  CT scan shows no evidence of aortic aneurysm.  There is diverticulosis without evidence of diverticulitis.  Labs are reassuring with mild hyponatremia. Hyperglycemia without DKA.  Patient reports no change in his pain after pain medication given in the ED.  Discussed he may have passed a kidney stone. Patient has 90 tablets of oxycodone per month last prescribed on July 19. He stated he was already out of this which is concerning. APAP level undetectable.   Patient sleeping on reassessment.  Pain is improved.  Suspect possibly passed kidney stone.  No evidence of acute spinal cord pathology.  Low suspicion for cord compression or cauda equina.  Urinalysis is negative.  Discussed follow-up with his PCP. Discussed that the ED cannot refill chronic pain medications.  Return precautions discussed.   Final Clinical Impression(s) / ED Diagnoses Final diagnoses:  Acute left-sided low back pain without sciatica    Rx / DC Orders ED Discharge Orders    None       Brigit Doke, Annie Main, MD 02/06/20 [phone]

## 2020-02-06 NOTE — Telephone Encounter (Signed)
Lmom, will look into why medication is too expensive.

## 2020-02-08 DIAGNOSIS — Z8639 Personal history of other endocrine, nutritional and metabolic disease: Secondary | ICD-10-CM | POA: Insufficient documentation

## 2020-02-11 MED ORDER — LINACLOTIDE 145 MCG PO CAPS: 145.0000 ug | ORAL_CAPSULE | Freq: Every day | ORAL | 3 refills | Status: DC

## 2020-02-11 NOTE — Telephone Encounter (Signed)
Let pt know we sent in linzess since amitiza not covered.

## 2020-02-11 NOTE — Addendum Note (Signed)
Addended by: Mahala Menghini on: 02/11/2020 06:02 PM   Modules accepted: Orders

## 2020-02-11 NOTE — Telephone Encounter (Signed)
Spoke with [facility] on [address]. Approved medications under pts insurance are Linzess and Lactulose. Amitiza isn't covered under pts insurance.

## 2020-02-12 NOTE — Telephone Encounter (Signed)
Spoke with pt and pt says the Linzess was over $300.00. Pt is aware that I will call the pharmacy to see if medication was covered under insurance or not covered. A PA may be required if not covered under insurance.

## 2020-02-20 ENCOUNTER — Telehealth: Payer: Self-pay | Admitting: Gastroenterology

## 2020-02-20 NOTE — Telephone Encounter (Signed)
Reviewed labs from PCP: December 26, 2019-lipase 28 October 24, 2019-A1c 9.6 July 22, 2019-White blood cell count 11,900, hemoglobin 15.7, platelets 134,000, albumin 4.3, total bilirubin 0.4, alk phos 91, AST 13, ALT 18  Reviewed ED evaluation recently.  CT renal protocol without etiology for abdominal pain. On this noncontrast study, pancreas and gallbladder unremarkable.  Labs reviewed, mild thrombocytopenia with platelet count of 148,000, white blood cell count normal, sodium was low at 129, glucose 313, AST 42, ALT 45, lipase normal.  [name], was patient able to get the Linzess? See telephone note 02/05/20.  If he is still having epig pain, nausea with better management of his constipation, he will need an upper endoscopy.

## 2020-02-25 NOTE — Telephone Encounter (Signed)
Checking into insurance and Linzess. Linzess is the preferred medication for Medicaid. Called pts insurance company and was on hold for 30 mins. Will call back.

## 2020-02-27 ENCOUNTER — Other Ambulatory Visit: Payer: Self-pay | Admitting: Cardiology

## 2020-02-28 ENCOUNTER — Telehealth: Payer: Self-pay | Admitting: Internal Medicine

## 2020-02-28 NOTE — Telephone Encounter (Addendum)
Ok. Please have patient call in two weeks with a progress report about abdominal pain after better management of constipation.  Lets also go ahead and make follow up appt in 6 weeks.

## 2020-02-28 NOTE — Telephone Encounter (Signed)
Spoke with pt. Pt will call in 2 weeks with a progress report. F/u apt was scheduled for 04/21/20.

## 2020-02-28 NOTE — Telephone Encounter (Signed)
Spoke with pts pharmacy. Linzess is covered under pts insurance. The pharmacy ran the insurance again and said the medication cost will be $9.20 for a 90 day supply. The pharmacist said they have a problem with their system sometimes giving them the cash price. When I called and told them the medication was covered, they were able to see the covered price. Pt is aware and will pick medication up today. Pt is also aware that labs were reviewed from his PCP.

## 2020-02-28 NOTE — Telephone Encounter (Signed)
Spoke with pt. See other phone note.

## 2020-02-28 NOTE — Telephone Encounter (Signed)
Pt returning call. [phone]

## 2020-03-06 ENCOUNTER — Ambulatory Visit: Payer: Medicare Other | Admitting: Internal Medicine

## 2020-03-09 ENCOUNTER — Telehealth: Payer: Self-pay | Admitting: Cardiology

## 2020-03-09 NOTE — Telephone Encounter (Signed)
Per pt phone call-- pt's been having chest pain for 1-2 weeks- states no SOB, he's breathing ok.   Pt was taken off some of his medications when he was seen in the ER @ [facility]

## 2020-03-09 NOTE — Telephone Encounter (Signed)
Pt says went to [facility] 7/30 (careeverywhere) stopped Coreg and changed pravastatin to atorvastatin - c/o chest pain off and on 1 week ago - happens multiple times daily lasting about an hour at the time rating pain 4/10 denies having to take NTG - feels like muscle spasms but was concerned with recent medication changes - doesn't have a way to check BP/HR at home - denies SOB/dizziness/swelling - pt has 9/9 appt with Katina Dung, NP - pt aware that if symptoms worsen to go back to ED or urgent care and would forward to provider

## 2020-03-09 NOTE — Telephone Encounter (Signed)
Agree with appointment with PA Morene Antu MD

## 2020-03-12 ENCOUNTER — Ambulatory Visit: Payer: Medicare Other | Admitting: Internal Medicine

## 2020-03-18 NOTE — Progress Notes (Signed)
Cardiology Office Note  Date: 03/19/2020   ID: [name], [name] 1966-01-01, MRN [id number]  PCP:  Sharilyn Sites, MD  Cardiologist:  Carlyle Dolly, MD Electrophysiologist:  None   Chief Complaint: Follow-up CAD/having chest pains for 2 weeks.  History of Present Illness: [name] is a 54 y.o. male with a history of CAD with inferior STEMI receiving BMS to RCA 2015.  Hyperlipidemia, DM 2, chronic back pain, pancreatitis.  Cardiac cath October 2018.  Patent coronary arteries, patent RCA stent, elevated LVEDP.  Last saw Dr. Harl Bowie on 12/16/2019.  Complained of dull pain mid chest to the right side.  2-3 out of 10 in severity.  Seemed to occur with activity.  No recent shortness of breath or DOE.  Lasting only a few seconds.  Occurring about once a week.  Since previous visit symptoms had resolved.  Was tolerating pravastatin.  Had reported prior myalgias on other statin medications.  He was to have fasting labs but apparently these were not done.  Had tried after the last few visits to get a repeat lipid panel but patient had not completed.   Recent hospital visit for epigastric pain at [facility] with diagnosis of acute pancreatitis, chronic midline low back pain with left-sided sciatica.  Elevated lipase, history of endogenous hyper triglyceridemia, tobacco use disorder,  type 2 diabetes.  Presented to the ED with complaint of 1 week history of diffuse abdominal pain had been initially seen 2 nights prior to admission at [facility].  Work-up in [facility] emergency department showed a lipase of 2362 with normal LFTs.  CT imaging of the abdomen yielded changes consistent with focal pancreatitis involving the tail of the pancreas which is new from prior exam on 02/06/2020.  He is admitted to hospitalist service with acute on chronic pancreatitis.  His triglyceride level was noted to be greater than 2000.  He was transferred to ICU and started on insulin drip.  Triglyceride level improved to  1120.  Insulin drip was discontinued.  He was started on Lipitor 40 mg.  His diabetes is uncontrolled with recent hemoglobin A1c of 10.7.  He was discharged with a prescription for Lantus at 14 units at bedtime and Metformin.  His carvedilol was held in the hospital due to bradycardia.  Today he states he continues to have some chest pain which appears to be associated with and without activity.  States he came in sitting still and it occurs.  He denies any palpitations or arrhythmias, orthostatic symptoms, CVA or TIA-like symptoms, PND, orthopnea, denies any nausea, vomiting, or diaphoresis when this occurs.  He states he has had occasional black tarry stools.  He has an appointment to see Dr. Gala Romney soon for his GI issues and pancreatitis.  He continues to smoke and has a long history of smoking.  States he smokes around half a pack of cigarettes per day.  His diabetes is not well controlled.  History of history of hypertriglyceridemia.  He is to follow with his PCP for management.  Providers at [facility] states this could be the cause of his pancreatitis.  His blood pressure is on the low side today at 90/60.  He denies any orthostatic symptoms such as lightheadedness, dizziness, presyncope or history of syncopal episode.   Past Medical History:  Diagnosis Date  . Anxiety   . Borderline diabetes   . Bulging disc   . Chronic back pain   . Closed intra-articular fracture of distal end of left  tibia   . Diabetes mellitus without complication ([place])   . Hypercholesteremia   . Myocardial infarction ([place])   . Pancreatitis     Past Surgical History:  Procedure Laterality Date  . ABDOMINAL SURGERY  1990   adhesions, blockage.   . APPENDECTOMY    . BACK SURGERY    . CARDIAC CATHETERIZATION  2003   "trivial coronary artery disease," normal EF  . COLONOSCOPY N/A 04/16/2015   Procedure: COLONOSCOPY;  Surgeon: Daneil Dolin, MD;  [facility];  Service: Endoscopy;  Laterality: N/A;  12:45 PM  .  COLONOSCOPY WITH PROPOFOL N/A 06/28/2018   Rourk: Diverticulosis, 2 tubular adenomas removed.  Next colonoscopy in 5 years.  Marland Kitchen LEFT HEART CATH AND CORONARY ANGIOGRAPHY N/A 05/09/2017   Procedure: LEFT HEART CATH AND CORONARY ANGIOGRAPHY;  Surgeon: Leonie Man, MD;  Location: [place] CV LAB;  Service: Cardiovascular;  Laterality: N/A;  . LEFT HEART CATHETERIZATION WITH CORONARY ANGIOGRAM N/A 03/13/2014   Procedure: LEFT HEART CATHETERIZATION WITH CORONARY ANGIOGRAM;  Surgeon: Leonie Man, MD;  [facility] C[facility] LAB;  Service: Cardiovascular;  Laterality: N/A;  . OPEN REDUCTION INTERNAL FIXATION (ORIF) TIBIA/FIBULA FRACTURE Left 10/01/2018   Procedure: OPEN REDUCTION INTERNAL FIXATION LEFT PILON FRACTURE;  Surgeon: Altamese Struthers, MD;  Location: [facility];  Service: Orthopedics;  Laterality: Left;  . POLYPECTOMY  06/28/2018   Procedure: POLYPECTOMY;  Surgeon: Daneil Dolin, MD;  [facility];  Service: Endoscopy;;  (colon)    Current Outpatient Medications  Medication Sig Dispense Refill  . aspirin 81 MG chewable tablet Chew 1 tablet (81 mg total) by mouth daily.    Marland Kitchen atorvastatin (LIPITOR) 40 MG tablet Take 40 mg by mouth at bedtime.    . furosemide (LASIX) 20 MG tablet Take 1 tablet (20 mg total) by mouth daily as needed. 90 tablet 3  . ibuprofen (ADVIL,MOTRIN) 200 MG tablet Take 800 mg by mouth every 6 (six) hours as needed for fever, headache or mild pain.     . isosorbide mononitrate (IMDUR) 60 MG 24 hr tablet TAKE 1 TABLET(60 MG) BY MOUTH DAILY 90 tablet 3  . LANTUS SOLOSTAR 100 UNIT/ML Solostar Pen Inject 14 Units into the skin at bedtime.    Marland Kitchen linaclotide (LINZESS) 145 MCG CAPS capsule Take 1 capsule (145 mcg total) by mouth daily before breakfast. 30 capsule 3  . lisinopril (ZESTRIL) 2.5 MG tablet Take 1 tablet (2.5 mg total) by mouth daily. 90 tablet 3  . metFORMIN (GLUCOPHAGE) 1000 MG tablet Take 1,000 mg by mouth 2 (two) times daily.    . nitroGLYCERIN (NITROSTAT)  0.4 MG SL tablet Place 1 tablet (0.4 mg total) under the tongue every 5 (five) minutes x 3 doses as needed for chest pain (if no relief after 3rd dose, proceed to the ED for an evaluation or call 911). 25 tablet 3  . oxyCODONE-acetaminophen (PERCOCET/ROXICET) 5-325 MG tablet Take 1-2 tablets by mouth every 4 (four) hours as needed.    . zolpidem (AMBIEN) 10 MG tablet Take 10 mg by mouth at bedtime as needed.     No current facility-administered medications for this visit.   Allergies:  Patient has no known allergies.   Social History: The patient  reports that he has been smoking cigarettes. He started smoking about 42 years ago. He has a 15.00 pack-year smoking history. He has never used smokeless tobacco. He reports current alcohol use of about 6.0 standard drinks of alcohol per week. He reports  that he does not use drugs.   Family History: The patient's family history includes Diabetes in his father and sister; Hyperlipidemia in his father, mother, and sister; Hypertension in his father, mother, and sister.   ROS:  Please see the history of present illness. Otherwise, complete review of systems is positive for none.  All other systems are reviewed and negative.   Physical Exam: VS:  BP 90/60   Pulse 62   Ht 6\' 1"  (1.854 m)   Wt 205 lb (93 kg)   SpO2 98%   BMI 27.05 kg/m , BMI Body mass index is 27.05 kg/m.  Wt Readings from Last 3 Encounters:  03/19/20 205 lb (93 kg)  02/05/20 (!) 208 lb (94.3 kg)  12/16/19 215 lb (97.5 kg)    General: Patient appears comfortable at rest. Neck: Supple, no elevated JVP or carotid bruits, no thyromegaly. Lungs: Clear to auscultation, nonlabored breathing at rest. Cardiac: Regular rate and rhythm, no S3 or significant systolic murmur, no pericardial rub. Extremities: No pitting edema, distal pulses 2+. Skin: Warm and dry. Musculoskeletal: No kyphosis. Neuropsychiatric: Alert and oriented x3, affect grossly appropriate.  ECG:    Recent  Labwork: 02/06/2020: ALT 45; AST 42; BUN 12; Creatinine, Ser 0.66; Hemoglobin 16.3; Platelets 148; Potassium 3.9; Sodium 129     Component Value Date/Time   CHOL 345 (H) 01/20/2014 0528   TRIG 1,132 (H) 01/20/2014 0528   HDL 20 (L) 01/20/2014 0528   CHOLHDL 17.3 01/20/2014 0528   VLDL UNABLE TO CALCULATE IF TRIGLYCERIDE OVER 400 mg/dL 01/20/2014 0528   LDLCALC UNABLE TO CALCULATE IF TRIGLYCERIDE OVER 400 mg/dL 01/20/2014 6256    Other Studies Reviewed Today:  02/2017 nuclear stress  No diagnostic ST segment changes to indicate ischemia.  Small, mild intensity, apical to basal inferior defect is partially reversible and suggestive of ischemia, although there is gut radiotracer uptake artifact adjacent to the inferior wall on rest imaging which could also be contributing to the defect reversibility.  This is a low risk study.  Nuclear stress EF: 63%.   04/2017 cath   Angiographically normal coronary arteries  Dist RCA BMS (VeriFlex 5.0 x 15), 0 %stenosed.  The left ventricular systolic function is normal. The left ventricular ejection fraction is 50-55% by visual estimate.  LV end diastolic pressure is moderately elevated.  Patient essentially has intra-graphic normal coronary arteries with widely RCA. No culprit lesion to explain inferior ischemia. Elevated LVEDP could explain microvascular ischemia.  Otherwise consider non-anginal etiology for chest pain.   Assessment and Plan:  1. CAD in native artery   2. Mixed hyperlipidemia   3. Chest pain, unspecified type   4. Smoking    1. CAD in native artery History of CAD with RCA stent 2015.  States he continues to have chest pain.  States it started out as mid epigastric but radiated up into midsternal area.  He has chest pain on and off usually lasting an hour at the time.  Has not used any sublingual nitroglycerin to assess if this may relieve the pain.  He states the pain is usually 3-4 out of 10 in severity.   Sometimes noted on the left side sometimes noted on the right side.  Denies any radiation to neck, arm, back, or jaw.  Denies any associated nausea, vomiting.  States he has periodic diaphoresis.  States it can occur with and without exertion.  Please get get a repeat Lexiscan Cardiolite stress test.  2. Mixed hyperlipidemia Patient was recently  switched from pravastatin to atorvastatin.  He had been intolerant to some other statins in the past.  He states he is tolerating the atorvastatin fine.  He has significant issues with triglyceridemia.  3. Chest pain, unspecified type Chest pain as mentioned above with atypical and typical symptoms.  Given history of CAD and multiple risk factors including smoking, age, history of diabetes, history of hyperlipidemia/triglyceridemia.  We have ordered a stress test as noted above in #1.  4.  Smoker.   Long history of smoking.  Patient states he has cut down on his smoking to 1/2 pack/day.  Advised cessation.   Medication Adjustments/Labs and Tests Ordered: Current medicines are reviewed at length with the patient today.  Concerns regarding medicines are outlined above.   Disposition: Follow-up with Dr. Harl Bowie or APP 4 to 6 weeks  Signed, Thomas July, NP 03/19/2020 8:52 AM    [facility] [place], [place], [place] [address] Phone: [phone]; Fax: [phone]

## 2020-03-19 ENCOUNTER — Other Ambulatory Visit: Payer: Self-pay

## 2020-03-19 ENCOUNTER — Telehealth: Payer: Self-pay | Admitting: Family Medicine

## 2020-03-19 ENCOUNTER — Ambulatory Visit (INDEPENDENT_AMBULATORY_CARE_PROVIDER_SITE_OTHER): Payer: Medicare Other | Admitting: Family Medicine

## 2020-03-19 ENCOUNTER — Encounter: Payer: Self-pay | Admitting: *Deleted

## 2020-03-19 ENCOUNTER — Encounter: Payer: Self-pay | Admitting: Family Medicine

## 2020-03-19 VITALS — BP 90/60 | HR 62 | Ht 73.0 in | Wt 205.0 lb

## 2020-03-19 DIAGNOSIS — I2510 Atherosclerotic heart disease of native coronary artery without angina pectoris: Secondary | ICD-10-CM | POA: Diagnosis not present

## 2020-03-19 DIAGNOSIS — E782 Mixed hyperlipidemia: Secondary | ICD-10-CM | POA: Diagnosis not present

## 2020-03-19 DIAGNOSIS — R079 Chest pain, unspecified: Secondary | ICD-10-CM

## 2020-03-19 DIAGNOSIS — F17200 Nicotine dependence, unspecified, uncomplicated: Secondary | ICD-10-CM | POA: Diagnosis not present

## 2020-03-19 MED ORDER — LISINOPRIL 2.5 MG PO TABS: 2.5000 mg | ORAL_TABLET | Freq: Every day | ORAL | 3 refills | Status: DC

## 2020-03-19 MED ORDER — NITROGLYCERIN 0.4 MG SL SUBL: 0.4000 mg | SUBLINGUAL_TABLET | SUBLINGUAL | 3 refills | Status: DC | PRN

## 2020-03-19 MED ORDER — FUROSEMIDE 20 MG PO TABS: 20.0000 mg | ORAL_TABLET | Freq: Every day | ORAL | 3 refills | Status: DC | PRN

## 2020-03-19 MED ORDER — ISOSORBIDE MONONITRATE ER 60 MG PO TB24: ORAL_TABLET | ORAL | 3 refills | Status: DC

## 2020-03-19 NOTE — Telephone Encounter (Signed)
Pre-cert Verification for the following procedure    Lexiscan Myoview dx: Chest Pain  DATE: 03/25/2020  LOCATION:  [facility]

## 2020-03-19 NOTE — Patient Instructions (Addendum)
Medication Instructions:   Your physician recommends that you continue on your current medications as directed. Please refer to the Current Medication list given to you today.  Labwork:  None  Testing/Procedures: Your physician has requested that you have a lexiscan myoview. For further information please visit [email or web address]. Please follow instruction sheet, as given.  Follow-Up:  Your physician recommends that you schedule a follow-up appointment in: 4 weeks.   Any Other Special Instructions Will Be Listed Below (If Applicable).  If you need a refill on your cardiac medications before your next appointment, please call your pharmacy.

## 2020-03-25 ENCOUNTER — Other Ambulatory Visit: Payer: Self-pay

## 2020-03-25 ENCOUNTER — Encounter (HOSPITAL_COMMUNITY): Payer: Self-pay

## 2020-03-25 ENCOUNTER — Encounter (HOSPITAL_BASED_OUTPATIENT_CLINIC_OR_DEPARTMENT_OTHER)
Admission: RE | Admit: 2020-03-25 | Discharge: 2020-03-25 | Disposition: A | Discharge status: Home / Self Care | Payer: Medicare Other | Source: Ambulatory Visit | Attending: Family Medicine | Admitting: Family Medicine

## 2020-03-25 ENCOUNTER — Encounter (HOSPITAL_COMMUNITY)
Admission: RE | Admit: 2020-03-25 | Discharge: 2020-03-25 | Disposition: A | Discharge status: Home / Self Care | Payer: Medicare Other | Source: Ambulatory Visit | Attending: Family Medicine | Admitting: Family Medicine

## 2020-03-25 DIAGNOSIS — R079 Chest pain, unspecified: Secondary | ICD-10-CM | POA: Diagnosis not present

## 2020-03-25 DIAGNOSIS — I2510 Atherosclerotic heart disease of native coronary artery without angina pectoris: Secondary | ICD-10-CM | POA: Diagnosis not present

## 2020-03-25 LAB — NM MYOCAR MULTI W/SPECT W/WALL MOTION / EF
LV dias vol: 111 mL (ref 62–150)
LV sys vol: 54 mL
Peak HR: 72 {beats}/min
RATE: 0.43
Rest HR: 55 {beats}/min
SDS: 0
SRS: 0
SSS: 0
TID: 1.13

## 2020-03-25 MED ORDER — REGADENOSON 0.4 MG/5ML IV SOLN
INTRAVENOUS | Status: AC
  Administered 2020-03-25: 0.4 mg via INTRAVENOUS
  Filled 2020-03-25: qty 5

## 2020-03-25 MED ORDER — SODIUM CHLORIDE FLUSH 0.9 % IV SOLN
INTRAVENOUS | Status: AC
  Administered 2020-03-25: 10 mL via INTRAVENOUS
  Filled 2020-03-25: qty 10

## 2020-03-25 MED ORDER — TECHNETIUM TC 99M TETROFOSMIN IV KIT
10.0000 | PACK | Freq: Once | INTRAVENOUS | Status: AC | PRN
  Administered 2020-03-25: 10.3 via INTRAVENOUS

## 2020-03-25 MED ORDER — TECHNETIUM TC 99M TETROFOSMIN IV KIT
30.0000 | PACK | Freq: Once | INTRAVENOUS | Status: AC | PRN
  Administered 2020-03-25: 32 via INTRAVENOUS

## 2020-03-30 ENCOUNTER — Telehealth: Payer: Self-pay | Admitting: *Deleted

## 2020-03-30 DIAGNOSIS — G894 Chronic pain syndrome: Secondary | ICD-10-CM | POA: Diagnosis not present

## 2020-03-30 NOTE — Telephone Encounter (Signed)
Patient informed. Copy sent to PCP °

## 2020-03-30 NOTE — Telephone Encounter (Signed)
-----   Message from Verta Ellen., NP sent at 03/25/2020  7:34 PM EDT ----- Stress test did not show any new areas of lack of blood flow. It does show an area that is more than likely scarring from his previous heart attack. The cardiologist who read the study stated it was a low risk study.  Thank you

## 2020-04-17 ENCOUNTER — Other Ambulatory Visit: Payer: Self-pay | Admitting: Cardiology

## 2020-04-21 ENCOUNTER — Other Ambulatory Visit: Payer: Self-pay

## 2020-04-21 ENCOUNTER — Encounter: Payer: Self-pay | Admitting: Gastroenterology

## 2020-04-21 ENCOUNTER — Ambulatory Visit (INDEPENDENT_AMBULATORY_CARE_PROVIDER_SITE_OTHER): Payer: Self-pay | Admitting: Gastroenterology

## 2020-04-21 VITALS — BP 98/63 | HR 65 | Temp 97.5°F | Ht 73.0 in | Wt 207.0 lb

## 2020-04-21 DIAGNOSIS — R1010 Upper abdominal pain, unspecified: Secondary | ICD-10-CM

## 2020-04-22 NOTE — Progress Notes (Signed)
Patient arrived to the office nearly one hour before his appointment. He left 30 minutes after his appointment time without being seen stating he would not wait any longer. Expletives used.

## 2020-04-28 DIAGNOSIS — G4700 Insomnia, unspecified: Secondary | ICD-10-CM | POA: Diagnosis not present

## 2020-04-28 DIAGNOSIS — I2510 Atherosclerotic heart disease of native coronary artery without angina pectoris: Secondary | ICD-10-CM | POA: Diagnosis not present

## 2020-04-28 DIAGNOSIS — Z23 Encounter for immunization: Secondary | ICD-10-CM | POA: Diagnosis not present

## 2020-04-28 DIAGNOSIS — E1165 Type 2 diabetes mellitus with hyperglycemia: Secondary | ICD-10-CM | POA: Diagnosis not present

## 2020-04-28 DIAGNOSIS — G8929 Other chronic pain: Secondary | ICD-10-CM | POA: Diagnosis not present

## 2020-04-28 DIAGNOSIS — M1991 Primary osteoarthritis, unspecified site: Secondary | ICD-10-CM | POA: Diagnosis not present

## 2020-04-28 DIAGNOSIS — Z0000 Encounter for general adult medical examination without abnormal findings: Secondary | ICD-10-CM | POA: Diagnosis not present

## 2020-04-29 ENCOUNTER — Ambulatory Visit (INDEPENDENT_AMBULATORY_CARE_PROVIDER_SITE_OTHER): Payer: Medicare Other | Admitting: Cardiology

## 2020-04-29 ENCOUNTER — Encounter: Payer: Self-pay | Admitting: Cardiology

## 2020-04-29 VITALS — BP 114/68 | HR 67 | Ht 73.0 in | Wt 210.2 lb

## 2020-04-29 DIAGNOSIS — E782 Mixed hyperlipidemia: Secondary | ICD-10-CM | POA: Diagnosis not present

## 2020-04-29 DIAGNOSIS — I2510 Atherosclerotic heart disease of native coronary artery without angina pectoris: Secondary | ICD-10-CM | POA: Diagnosis not present

## 2020-04-29 NOTE — Progress Notes (Signed)
Clinical Summary [name] is a 54 y.o.male seen today for follow up of the following medical problems.   1. CAD - admit 03/2014 with inferior STEMI, received BMS to RCA. LVEF 45-50% by LV gram. 04/2017 cath: patent coronaries, patent RCA stent. Elevated LVEDP 26  - dull pain mid chest to right side. 2-3/10 in severity. Seems to occur with activity. No recent SOB/DOE. Lasts just a few seconds. Occurs about once a week  - since last visit symptoms have resolved. Denies any recent symptoms   - seen 03/19/20 by PA Leonides Sake for chest pain, lexiscan was ordered - 03/2020 nuclear stress: no ischemia  - dull or sharp pain midchest, 4/10 in severity. Can occur at rest or with activity. No other associated sytmpoms. Worst with deep breathing. Sometimes can be constant x 24 hours.    2. Hyperlipidemia - tolerates pravastatin, reports prior myaglias on a prior statins - elevated TGs by last labs. Was to have repeat labs but does not appear this was done. Most recent labs he reports he had coffee with whole milk prior to labs, was not a true fasting lab - muscle aches on gemfibrozil in the past, tolerated fenofibrate but had some financial issues with costs of medicines about 5 years ago and medication was stopped   01/2020 TG 1120. Has made drastic changes in his diet, reports HgbA1c down from 10 to 7. Reports upcoming labs with pcp   Past Medical History:  Diagnosis Date  . Anxiety   . Borderline diabetes   . Bulging disc   . Chronic back pain   . Closed intra-articular fracture of distal end of left tibia   . Diabetes mellitus without complication ([employer])   . Hypercholesteremia   . Myocardial infarction ([place])   . Pancreatitis      No Known Allergies   Current Outpatient Medications  Medication Sig Dispense Refill  . aspirin 81 MG chewable tablet Chew 1 tablet (81 mg total) by mouth daily.    Marland Kitchen atorvastatin (LIPITOR) 40 MG tablet Take 40 mg by mouth at bedtime.    .  furosemide (LASIX) 20 MG tablet Take 1 tablet (20 mg total) by mouth daily as needed. 90 tablet 3  . ibuprofen (ADVIL,MOTRIN) 200 MG tablet Take 800 mg by mouth every 6 (six) hours as needed for fever, headache or mild pain.     . isosorbide mononitrate (IMDUR) 60 MG 24 hr tablet TAKE 1 TABLET(60 MG) BY MOUTH DAILY 90 tablet 3  . LANTUS SOLOSTAR 100 UNIT/ML Solostar Pen Inject 14 Units into the skin at bedtime.    Marland Kitchen linaclotide (LINZESS) 145 MCG CAPS capsule Take 1 capsule (145 mcg total) by mouth daily before breakfast. 30 capsule 3  . lisinopril (ZESTRIL) 2.5 MG tablet Take 1 tablet (2.5 mg total) by mouth daily. 90 tablet 3  . metFORMIN (GLUCOPHAGE) 1000 MG tablet Take 1,000 mg by mouth 2 (two) times daily.    . nitroGLYCERIN (NITROSTAT) 0.4 MG SL tablet Place 1 tablet (0.4 mg total) under the tongue every 5 (five) minutes x 3 doses as needed for chest pain (if no relief after 3rd dose, proceed to the ED for an evaluation or call 911). 25 tablet 3  . oxyCODONE-acetaminophen (PERCOCET/ROXICET) 5-325 MG tablet Take 1-2 tablets by mouth every 4 (four) hours as needed.    . zolpidem (AMBIEN) 10 MG tablet Take 10 mg by mouth at bedtime as needed.     No current facility-administered medications for  this visit.     Past Surgical History:  Procedure Laterality Date  . ABDOMINAL SURGERY  1990   adhesions, blockage.   . APPENDECTOMY    . BACK SURGERY    . CARDIAC CATHETERIZATION  2003   "trivial coronary artery disease," normal EF  . COLONOSCOPY N/A 04/16/2015   Procedure: COLONOSCOPY;  Surgeon: Daneil Dolin, MD;  [facility];  Service: Endoscopy;  Laterality: N/A;  12:45 PM  . COLONOSCOPY WITH PROPOFOL N/A 06/28/2018   Rourk: Diverticulosis, 2 tubular adenomas removed.  Next colonoscopy in 5 years.  Marland Kitchen LEFT HEART CATH AND CORONARY ANGIOGRAPHY N/A 05/09/2017   Procedure: LEFT HEART CATH AND CORONARY ANGIOGRAPHY;  Surgeon: Leonie Man, MD;  Location: [facility] CV LAB;   Service: Cardiovascular;  Laterality: N/A;  . LEFT HEART CATHETERIZATION WITH CORONARY ANGIOGRAM N/A 03/13/2014   Procedure: LEFT HEART CATHETERIZATION WITH CORONARY ANGIOGRAM;  Surgeon: Leonie Man, MD;  Location: [facility] CATH LAB;  Service: Cardiovascular;  Laterality: N/A;  . OPEN REDUCTION INTERNAL FIXATION (ORIF) TIBIA/FIBULA FRACTURE Left 10/01/2018   Procedure: OPEN REDUCTION INTERNAL FIXATION LEFT PILON FRACTURE;  Surgeon: Altamese [place], MD;  Location: [facility];  Service: Orthopedics;  Laterality: Left;  . POLYPECTOMY  06/28/2018   Procedure: POLYPECTOMY;  Surgeon: Daneil Dolin, MD;  [facility];  Service: Endoscopy;;  (colon)     No Known Allergies    Family History  Problem Relation Age of Onset  . Diabetes Father   . Hyperlipidemia Father   . Hypertension Father   . Hypertension Mother   . Hyperlipidemia Mother   . Hyperlipidemia Sister   . Hypertension Sister   . Diabetes Sister      Social History [name] reports that he has been smoking cigarettes. He started smoking about 42 years ago. He has a 15.00 pack-year smoking history. He has never used smokeless tobacco. [name] reports current alcohol use of about 6.0 standard drinks of alcohol per week.   Review of Systems CONSTITUTIONAL: No weight loss, fever, chills, weakness or fatigue.  HEENT: Eyes: No visual loss, blurred vision, double vision or yellow sclerae.No hearing loss, sneezing, congestion, runny nose or sore throat.  SKIN: No rash or itching.  CARDIOVASCULAR: per hpi RESPIRATORY: No shortness of breath, cough or sputum.  GASTROINTESTINAL: No anorexia, nausea, vomiting or diarrhea. No abdominal pain or blood.  GENITOURINARY: No burning on urination, no polyuria NEUROLOGICAL: No headache, dizziness, syncope, paralysis, ataxia, numbness or tingling in the extremities. No change in bowel or bladder control.  MUSCULOSKELETAL: No muscle, back pain, joint pain or stiffness.  LYMPHATICS: No enlarged  nodes. No history of splenectomy.  PSYCHIATRIC: No history of depression or anxiety.  ENDOCRINOLOGIC: No reports of sweating, cold or heat intolerance. No polyuria or polydipsia.  Marland Kitchen   Physical Examination Today's Vitals   04/29/20 1009  BP: 114/68  Pulse: 67  SpO2: 97%  Weight: 210 lb 3.2 oz (95.3 kg)  Height: 6\' 1"  (1.854 m)   Body mass index is 27.73 kg/m.  Gen: resting comfortably, no acute distress HEENT: no scleral icterus, pupils equal round and reactive, no palptable cervical adenopathy,  CV: RRR, no m/r/g, no jvd Resp: Clear to auscultation bilaterally GI: abdomen is soft, non-tender, non-distended, normal bowel sounds, no hepatosplenomegaly MSK: extremities are warm, no edema.  Skin: warm, no rash Neuro:  no focal deficits Psych: appropriate affect   Diagnostic Studies  02/2017 nuclear stress  No diagnostic ST segment changes to indicate  ischemia.  Small, mild intensity, apical to basal inferior defect is partially reversible and suggestive of ischemia, although there is gut radiotracer uptake artifact adjacent to the inferior wall on rest imaging which could also be contributing to the defect reversibility.  This is a low risk study.  Nuclear stress EF: 63%.   04/2017 cath   Angiographically normal coronary arteries  Dist RCA BMS (VeriFlex 5.0 x 15), 0 %stenosed.  The left ventricular systolic function is normal. The left ventricular ejection fraction is 50-55% by visual estimate.  LV end diastolic pressure is moderately elevated.  Patient essentially has intra-graphic normal coronary arteries with widely RCA. No culprit lesion to explain inferior ischemia. Elevated LVEDP could explain microvascular ischemia.  Otherwise consider non-anginal etiology for chest pain.   Plan: Discharge home today after bed rest with TR band removal.  03/2020 nuclear stress  No diagnostic ST segment changes to indicate ischemia.  Medium sized, moderate  intensity, fixed apical to basal inferior defect that is more prominent at rest and consistent with soft tissue attenuation versus scar. No substantial ischemic territories.  This is a low risk study.  Nuclear stress EF: 51%.  Assessment and Plan  1. CAD -recent atypical chest pains, stress test without evidence of ischemia - continue current meds   2. Hyperlipidemia -continue atorvastatin - very high TGs, has made dramatic changes in diet and also DM2 control - f/u upcoming labs with pcp - could consider fenofibrate if remains elevated, vascepa and lovaza tend to be very expensive.       Arnoldo Lenis, M.D

## 2020-04-29 NOTE — Patient Instructions (Addendum)
Medication Instructions:  Continue all current medications.  Labwork: none  Testing/Procedures: none  Follow-Up: 4 months   Any Other Special Instructions Will Be Listed Below (If Applicable).  If you need a refill on your cardiac medications before your next appointment, please call your pharmacy.\ 

## 2020-05-17 ENCOUNTER — Other Ambulatory Visit: Payer: Self-pay | Admitting: Cardiology

## 2020-05-26 DIAGNOSIS — G894 Chronic pain syndrome: Secondary | ICD-10-CM | POA: Diagnosis not present

## 2020-06-20 ENCOUNTER — Other Ambulatory Visit: Payer: Self-pay | Admitting: Cardiology

## 2020-06-25 DIAGNOSIS — G894 Chronic pain syndrome: Secondary | ICD-10-CM | POA: Diagnosis not present

## 2020-06-26 ENCOUNTER — Other Ambulatory Visit: Payer: Self-pay | Admitting: Gastroenterology

## 2020-06-30 ENCOUNTER — Ambulatory Visit: Payer: Medicare Other | Admitting: Cardiology

## 2020-07-07 DIAGNOSIS — E119 Type 2 diabetes mellitus without complications: Secondary | ICD-10-CM | POA: Diagnosis not present

## 2020-07-23 DIAGNOSIS — G894 Chronic pain syndrome: Secondary | ICD-10-CM | POA: Diagnosis not present

## 2020-08-08 DIAGNOSIS — E7849 Other hyperlipidemia: Secondary | ICD-10-CM | POA: Diagnosis not present

## 2020-08-08 DIAGNOSIS — I2510 Atherosclerotic heart disease of native coronary artery without angina pectoris: Secondary | ICD-10-CM | POA: Diagnosis not present

## 2020-08-08 DIAGNOSIS — E1165 Type 2 diabetes mellitus with hyperglycemia: Secondary | ICD-10-CM | POA: Diagnosis not present

## 2020-08-24 DIAGNOSIS — G894 Chronic pain syndrome: Secondary | ICD-10-CM | POA: Diagnosis not present

## 2020-08-24 DIAGNOSIS — E119 Type 2 diabetes mellitus without complications: Secondary | ICD-10-CM | POA: Diagnosis not present

## 2020-09-01 ENCOUNTER — Ambulatory Visit (INDEPENDENT_AMBULATORY_CARE_PROVIDER_SITE_OTHER): Payer: Medicare Other | Admitting: Cardiology

## 2020-09-01 ENCOUNTER — Encounter: Payer: Self-pay | Admitting: Cardiology

## 2020-09-01 ENCOUNTER — Encounter: Payer: Self-pay | Admitting: *Deleted

## 2020-09-01 VITALS — BP 104/68 | HR 65 | Ht 73.0 in | Wt 212.0 lb

## 2020-09-01 DIAGNOSIS — E782 Mixed hyperlipidemia: Secondary | ICD-10-CM

## 2020-09-01 DIAGNOSIS — I2510 Atherosclerotic heart disease of native coronary artery without angina pectoris: Secondary | ICD-10-CM | POA: Diagnosis not present

## 2020-09-01 MED ORDER — ISOSORBIDE MONONITRATE ER 60 MG PO TB24: ORAL_TABLET | ORAL | 1 refills | Status: DC

## 2020-09-01 NOTE — Patient Instructions (Signed)
Your physician recommends that you schedule a follow-up appointment in: 6 MONTHS WITH DR BRANCH  Your physician recommends that you continue on your current medications as directed. Please refer to the Current Medication list given to you today.  Thank you for choosing [facility]!!    

## 2020-09-01 NOTE — Progress Notes (Signed)
Clinical Summary [name] is a 55 y.o.male seen today for follow up of the following medical problems.   1. CAD - admit 03/2014 with inferior STEMI, received BMS to RCA. LVEF 45-50% by LV gram. 04/2017 cath: patent coronaries, patent RCA stent. Elevated LVEDP 26  - dull pain mid chest to right side. 2-3/10 in severity. Seems to occur with activity. No recent SOB/DOE. Lasts just a few seconds. Occurs about once a week  - since last visit symptoms have resolved.Denies any recent symptoms   - seen 03/19/20 by PA Leonides Sake for chest pain, lexiscan was ordered - 03/2020 nuclear stress: no ischemia   - no recent chest pain - no sob/doe - compliant with m eds   2. Hyperlipidemia - tolerates pravastatin, reports prior myaglias on a prior statins - elevated TGs by last labs. Was to have repeat labs but does not appear this was done. Most recent labs he reports he had coffee with whole milk prior to labs, was not a true fasting lab - muscle aches on gemfibrozil in the past, tolerated fenofibrate but had some financial issues with costs of medicines about 5 years ago and medication was stopped   01/2020 TG 1120. Has made drastic changes in his diet, reports HgbA1c down from 10 to 7. Reports upcoming labs with pcp - reports most recent labs with pcp - remains on statin, fish oil    Past Medical History:  Diagnosis Date  . Anxiety   . Borderline diabetes   . Bulging disc   . Chronic back pain   . Closed intra-articular fracture of distal end of left tibia   . Diabetes mellitus without complication ([place])   . Hypercholesteremia   . Myocardial infarction ([place])   . Pancreatitis      No Known Allergies   Current Outpatient Medications  Medication Sig Dispense Refill  . aspirin 81 MG chewable tablet Chew 1 tablet (81 mg total) by mouth daily.    Marland Kitchen atorvastatin (LIPITOR) 40 MG tablet Take 40 mg by mouth at bedtime.    . furosemide (LASIX) 20 MG tablet Take 1 tablet (20 mg  total) by mouth daily as needed. 90 tablet 3  . ibuprofen (ADVIL,MOTRIN) 200 MG tablet Take 800 mg by mouth every 6 (six) hours as needed for fever, headache or mild pain.     . isosorbide mononitrate (IMDUR) 60 MG 24 hr tablet TAKE 1 TABLET(60 MG) BY MOUTH DAILY 90 tablet 1  . LANTUS SOLOSTAR 100 UNIT/ML Solostar Pen Inject 14 Units into the skin at bedtime.    Marland Kitchen LINZESS 145 MCG CAPS capsule TAKE 1 CAPSULE(145 MCG) BY MOUTH DAILY BEFORE BREAKFAST 30 capsule 3  . lisinopril (ZESTRIL) 2.5 MG tablet Take 1 tablet (2.5 mg total) by mouth daily. 90 tablet 3  . metFORMIN (GLUCOPHAGE) 1000 MG tablet Take 1,000 mg by mouth 2 (two) times daily.    . Multiple Vitamin (MULTIVITAMIN) tablet Take 1 tablet by mouth daily.    . nitroGLYCERIN (NITROSTAT) 0.4 MG SL tablet Place 1 tablet (0.4 mg total) under the tongue every 5 (five) minutes x 3 doses as needed for chest pain (if no relief after 3rd dose, proceed to the ED for an evaluation or call 911). 25 tablet 3  . Omega-3 Fatty Acids (SALMON OIL-1000 PO) Take 1 capsule by mouth daily.    Marland Kitchen oxyCODONE-acetaminophen (PERCOCET/ROXICET) 5-325 MG tablet Take 1-2 tablets by mouth every 4 (four) hours as needed.    . zolpidem (  AMBIEN) 10 MG tablet Take 10 mg by mouth at bedtime as needed.     No current facility-administered medications for this visit.     Past Surgical History:  Procedure Laterality Date  . ABDOMINAL SURGERY  1990   adhesions, blockage.   . APPENDECTOMY    . BACK SURGERY    . CARDIAC CATHETERIZATION  2003   "trivial coronary artery disease," normal EF  . COLONOSCOPY N/A 04/16/2015   Procedure: COLONOSCOPY;  Surgeon: Daneil Dolin, MD;  [facility];  Service: Endoscopy;  Laterality: N/A;  12:45 PM  . COLONOSCOPY WITH PROPOFOL N/A 06/28/2018   Rourk: Diverticulosis, 2 tubular adenomas removed.  Next colonoscopy in 5 years.  Marland Kitchen LEFT HEART CATH AND CORONARY ANGIOGRAPHY N/A 05/09/2017   Procedure: LEFT HEART CATH AND CORONARY  ANGIOGRAPHY;  Surgeon: Leonie Man, MD;  Location: [facility];  Service: Cardiovascular;  Laterality: N/A;  . LEFT HEART CATHETERIZATION WITH CORONARY ANGIOGRAM N/A 03/13/2014   Procedure: LEFT HEART CATHETERIZATION WITH CORONARY ANGIOGRAM;  Surgeon: Leonie Man, MD;  Location: [facility];  Service: Cardiovascular;  Laterality: N/A;  . OPEN REDUCTION INTERNAL FIXATION (ORIF) TIBIA/FIBULA FRACTURE Left 10/01/2018   Procedure: OPEN REDUCTION INTERNAL FIXATION LEFT PILON FRACTURE;  Surgeon: Altamese [place], MD;  Location: [facility];  Service: Orthopedics;  Laterality: Left;  . POLYPECTOMY  06/28/2018   Procedure: POLYPECTOMY;  Surgeon: Daneil Dolin, MD;  [facility];  Service: Endoscopy;;  (colon)     No Known Allergies    Family History  Problem Relation Age of Onset  . Diabetes Father   . Hyperlipidemia Father   . Hypertension Father   . Hypertension Mother   . Hyperlipidemia Mother   . Hyperlipidemia Sister   . Hypertension Sister   . Diabetes Sister      Social History [name] reports that he has been smoking cigarettes. He started smoking about 42 years ago. He has a 15.00 pack-year smoking history. He has never used smokeless tobacco. [name] reports current alcohol use of about 6.0 standard drinks of alcohol per week.   Review of Systems CONSTITUTIONAL: No weight loss, fever, chills, weakness or fatigue.  HEENT: Eyes: No visual loss, blurred vision, double vision or yellow sclerae.No hearing loss, sneezing, congestion, runny nose or sore throat.  SKIN: No rash or itching.  CARDIOVASCULAR: per hpi RESPIRATORY: No shortness of breath, cough or sputum.  GASTROINTESTINAL: No anorexia, nausea, vomiting or diarrhea. No abdominal pain or blood.  GENITOURINARY: No burning on urination, no polyuria NEUROLOGICAL: No headache, dizziness, syncope, paralysis, ataxia, numbness or tingling in the extremities. No change in bowel or bladder control.   MUSCULOSKELETAL: No muscle, back pain, joint pain or stiffness.  LYMPHATICS: No enlarged nodes. No history of splenectomy.  PSYCHIATRIC: No history of depression or anxiety.  ENDOCRINOLOGIC: No reports of sweating, cold or heat intolerance. No polyuria or polydipsia.  Marland Kitchen   Physical Examination Today's Vitals   09/01/20 1540  BP: 104/68  Pulse: 65  SpO2: 95%  Weight: 212 lb (96.2 kg)  Height: 6\' 1"  (1.854 m)   Body mass index is 27.97 kg/m.  Gen: resting comfortably, no acute distress HEENT: no scleral icterus, pupils equal round and reactive, no palptable cervical adenopathy,  CV: RRR, no m/r/g, no jvd Resp: Clear to auscultation bilaterally GI: abdomen is soft, non-tender, non-distended, normal bowel sounds, no hepatosplenomegaly MSK: extremities are warm, no edema.  Skin: warm, no rash Neuro:  no focal  deficits Psych: appropriate affect   Diagnostic Studies 02/2017 nuclear stress  No diagnostic ST segment changes to indicate ischemia.  Small, mild intensity, apical to basal inferior defect is partially reversible and suggestive of ischemia, although there is gut radiotracer uptake artifact adjacent to the inferior wall on rest imaging which could also be contributing to the defect reversibility.  This is a low risk study.  Nuclear stress EF: 63%.   04/2017 cath   Angiographically normal coronary arteries  Dist RCA BMS (VeriFlex 5.0 x 15), 0 %stenosed.  The left ventricular systolic function is normal. The left ventricular ejection fraction is 50-55% by visual estimate.  LV end diastolic pressure is moderately elevated.  Patient essentially has intra-graphic normal coronary arteries with widely RCA. No culprit lesion to explain inferior ischemia. Elevated LVEDP could explain microvascular ischemia.  Otherwise consider non-anginal etiology for chest pain.   Plan: Discharge home today after bed rest with TR band removal.  03/2020 nuclear  stress  No diagnostic ST segment changes to indicate ischemia.  Medium sized, moderate intensity, fixed apical to basal inferior defect that is more prominent at rest and consistent with soft tissue attenuation versus scar. No substantial ischemic territories.  This is a low risk study.  Nuclear stress EF: 51%.     Assessment and Plan   1. CAD -no symptoms, continue current meds   2. Hyperlipidemia -continue statin, fish oil - request labs from pcp    Arnoldo Lenis, M.D

## 2020-09-16 DIAGNOSIS — M65342 Trigger finger, left ring finger: Secondary | ICD-10-CM | POA: Diagnosis not present

## 2020-09-16 DIAGNOSIS — M65331 Trigger finger, right middle finger: Secondary | ICD-10-CM | POA: Diagnosis not present

## 2020-09-16 DIAGNOSIS — M79642 Pain in left hand: Secondary | ICD-10-CM | POA: Diagnosis not present

## 2020-09-16 DIAGNOSIS — M79641 Pain in right hand: Secondary | ICD-10-CM | POA: Diagnosis not present

## 2020-09-21 DIAGNOSIS — G894 Chronic pain syndrome: Secondary | ICD-10-CM | POA: Diagnosis not present

## 2020-10-07 DIAGNOSIS — E7849 Other hyperlipidemia: Secondary | ICD-10-CM | POA: Diagnosis not present

## 2020-10-07 DIAGNOSIS — I2510 Atherosclerotic heart disease of native coronary artery without angina pectoris: Secondary | ICD-10-CM | POA: Diagnosis not present

## 2020-10-07 DIAGNOSIS — E1165 Type 2 diabetes mellitus with hyperglycemia: Secondary | ICD-10-CM | POA: Diagnosis not present

## 2020-10-19 DIAGNOSIS — M19019 Primary osteoarthritis, unspecified shoulder: Secondary | ICD-10-CM | POA: Diagnosis not present

## 2020-10-19 DIAGNOSIS — M7581 Other shoulder lesions, right shoulder: Secondary | ICD-10-CM | POA: Diagnosis not present

## 2020-10-19 DIAGNOSIS — G8929 Other chronic pain: Secondary | ICD-10-CM | POA: Diagnosis not present

## 2020-10-19 DIAGNOSIS — M25511 Pain in right shoulder: Secondary | ICD-10-CM | POA: Diagnosis not present

## 2020-10-21 DIAGNOSIS — E7849 Other hyperlipidemia: Secondary | ICD-10-CM | POA: Diagnosis not present

## 2020-10-21 DIAGNOSIS — E782 Mixed hyperlipidemia: Secondary | ICD-10-CM | POA: Diagnosis not present

## 2020-10-21 DIAGNOSIS — Z9119 Patient's noncompliance with other medical treatment and regimen: Secondary | ICD-10-CM | POA: Diagnosis not present

## 2020-10-21 DIAGNOSIS — Z0000 Encounter for general adult medical examination without abnormal findings: Secondary | ICD-10-CM | POA: Diagnosis not present

## 2020-10-21 DIAGNOSIS — Z23 Encounter for immunization: Secondary | ICD-10-CM | POA: Diagnosis not present

## 2020-10-21 DIAGNOSIS — Z1389 Encounter for screening for other disorder: Secondary | ICD-10-CM | POA: Diagnosis not present

## 2020-10-26 DIAGNOSIS — J302 Other seasonal allergic rhinitis: Secondary | ICD-10-CM | POA: Diagnosis not present

## 2020-10-26 DIAGNOSIS — G894 Chronic pain syndrome: Secondary | ICD-10-CM | POA: Diagnosis not present

## 2020-10-26 DIAGNOSIS — W57XXXA Bitten or stung by nonvenomous insect and other nonvenomous arthropods, initial encounter: Secondary | ICD-10-CM | POA: Diagnosis not present

## 2020-11-19 ENCOUNTER — Other Ambulatory Visit: Payer: Self-pay | Admitting: Cardiology

## 2020-11-23 DIAGNOSIS — M65342 Trigger finger, left ring finger: Secondary | ICD-10-CM | POA: Diagnosis not present

## 2020-11-23 DIAGNOSIS — G8929 Other chronic pain: Secondary | ICD-10-CM | POA: Diagnosis not present

## 2020-11-23 DIAGNOSIS — M19019 Primary osteoarthritis, unspecified shoulder: Secondary | ICD-10-CM | POA: Diagnosis not present

## 2020-11-23 DIAGNOSIS — M25511 Pain in right shoulder: Secondary | ICD-10-CM | POA: Diagnosis not present

## 2020-11-23 DIAGNOSIS — M7581 Other shoulder lesions, right shoulder: Secondary | ICD-10-CM | POA: Diagnosis not present

## 2020-11-23 DIAGNOSIS — M79641 Pain in right hand: Secondary | ICD-10-CM | POA: Diagnosis not present

## 2020-11-23 DIAGNOSIS — M65331 Trigger finger, right middle finger: Secondary | ICD-10-CM | POA: Diagnosis not present

## 2020-11-23 DIAGNOSIS — M79642 Pain in left hand: Secondary | ICD-10-CM | POA: Diagnosis not present

## 2020-11-25 DIAGNOSIS — G894 Chronic pain syndrome: Secondary | ICD-10-CM | POA: Diagnosis not present

## 2020-11-30 DIAGNOSIS — M5136 Other intervertebral disc degeneration, lumbar region: Secondary | ICD-10-CM | POA: Diagnosis not present

## 2020-11-30 DIAGNOSIS — M62830 Muscle spasm of back: Secondary | ICD-10-CM | POA: Diagnosis not present

## 2020-12-01 ENCOUNTER — Other Ambulatory Visit: Payer: Self-pay | Admitting: Cardiology

## 2020-12-08 ENCOUNTER — Telehealth: Payer: Self-pay | Admitting: Cardiology

## 2020-12-08 ENCOUNTER — Other Ambulatory Visit: Payer: Self-pay | Admitting: Cardiology

## 2020-12-08 DIAGNOSIS — I2510 Atherosclerotic heart disease of native coronary artery without angina pectoris: Secondary | ICD-10-CM | POA: Diagnosis not present

## 2020-12-08 DIAGNOSIS — E7849 Other hyperlipidemia: Secondary | ICD-10-CM | POA: Diagnosis not present

## 2020-12-08 DIAGNOSIS — E1165 Type 2 diabetes mellitus with hyperglycemia: Secondary | ICD-10-CM | POA: Diagnosis not present

## 2020-12-08 NOTE — Telephone Encounter (Signed)
Medication refill request filled via [employer].

## 2020-12-08 NOTE — Telephone Encounter (Signed)
[facility] in [place]

## 2020-12-15 DIAGNOSIS — E119 Type 2 diabetes mellitus without complications: Secondary | ICD-10-CM | POA: Diagnosis not present

## 2020-12-15 DIAGNOSIS — G894 Chronic pain syndrome: Secondary | ICD-10-CM | POA: Diagnosis not present

## 2020-12-15 DIAGNOSIS — Z0001 Encounter for general adult medical examination with abnormal findings: Secondary | ICD-10-CM | POA: Diagnosis not present

## 2021-01-25 DIAGNOSIS — G894 Chronic pain syndrome: Secondary | ICD-10-CM | POA: Diagnosis not present

## 2021-02-07 DIAGNOSIS — E1165 Type 2 diabetes mellitus with hyperglycemia: Secondary | ICD-10-CM | POA: Diagnosis not present

## 2021-02-07 DIAGNOSIS — E782 Mixed hyperlipidemia: Secondary | ICD-10-CM | POA: Diagnosis not present

## 2021-02-07 DIAGNOSIS — I2510 Atherosclerotic heart disease of native coronary artery without angina pectoris: Secondary | ICD-10-CM | POA: Diagnosis not present

## 2021-02-24 ENCOUNTER — Other Ambulatory Visit: Payer: Self-pay | Admitting: Cardiology

## 2021-02-25 DIAGNOSIS — G894 Chronic pain syndrome: Secondary | ICD-10-CM | POA: Diagnosis not present

## 2021-02-25 DIAGNOSIS — R350 Frequency of micturition: Secondary | ICD-10-CM | POA: Diagnosis not present

## 2021-02-25 DIAGNOSIS — Z7189 Other specified counseling: Secondary | ICD-10-CM | POA: Diagnosis not present

## 2021-02-27 ENCOUNTER — Other Ambulatory Visit: Payer: Self-pay

## 2021-02-27 ENCOUNTER — Encounter (HOSPITAL_COMMUNITY): Payer: Self-pay | Admitting: Emergency Medicine

## 2021-02-27 ENCOUNTER — Emergency Department (HOSPITAL_COMMUNITY)
Admission: EM | Admit: 2021-02-27 | Discharge: 2021-02-27 | Disposition: A | Discharge status: Home / Self Care | Payer: Medicare Other | Attending: Emergency Medicine | Admitting: Emergency Medicine

## 2021-02-27 DIAGNOSIS — F17210 Nicotine dependence, cigarettes, uncomplicated: Secondary | ICD-10-CM | POA: Insufficient documentation

## 2021-02-27 DIAGNOSIS — R350 Frequency of micturition: Secondary | ICD-10-CM | POA: Diagnosis not present

## 2021-02-27 DIAGNOSIS — I2510 Atherosclerotic heart disease of native coronary artery without angina pectoris: Secondary | ICD-10-CM | POA: Diagnosis not present

## 2021-02-27 DIAGNOSIS — E119 Type 2 diabetes mellitus without complications: Secondary | ICD-10-CM | POA: Insufficient documentation

## 2021-02-27 LAB — CBC WITH DIFFERENTIAL/PLATELET
Abs Immature Granulocytes: 0.11 10*3/uL — ABNORMAL HIGH (ref 0.00–0.07)
Basophils Absolute: 0.1 10*3/uL (ref 0.0–0.1)
Basophils Relative: 1 %
Eosinophils Absolute: 0.2 10*3/uL (ref 0.0–0.5)
Eosinophils Relative: 2 %
HCT: 47.6 % (ref 39.0–52.0)
Hemoglobin: 16.3 g/dL (ref 13.0–17.0)
Immature Granulocytes: 1 %
Lymphocytes Relative: 29 %
Lymphs Abs: 3.4 10*3/uL (ref 0.7–4.0)
MCH: 32.5 pg (ref 26.0–34.0)
MCHC: 34.2 g/dL (ref 30.0–36.0)
MCV: 94.8 fL (ref 80.0–100.0)
Monocytes Absolute: 0.8 10*3/uL (ref 0.1–1.0)
Monocytes Relative: 7 %
Neutro Abs: 7 10*3/uL (ref 1.7–7.7)
Neutrophils Relative %: 60 %
Platelets: 184 10*3/uL (ref 150–400)
RBC: 5.02 MIL/uL (ref 4.22–5.81)
RDW: 12.6 % (ref 11.5–15.5)
WBC: 11.5 10*3/uL — ABNORMAL HIGH (ref 4.0–10.5)
nRBC: 0 % (ref 0.0–0.2)

## 2021-02-27 LAB — BASIC METABOLIC PANEL
Anion gap: 6 (ref 5–15)
BUN: 15 mg/dL (ref 6–20)
CO2: 26 mmol/L (ref 22–32)
Calcium: 9.2 mg/dL (ref 8.9–10.3)
Chloride: 104 mmol/L (ref 98–111)
Creatinine, Ser: 0.92 mg/dL (ref 0.61–1.24)
GFR, Estimated: >60 mL/min (ref >60)
Glucose, Bld: 156 mg/dL — ABNORMAL HIGH (ref 70–99)
Potassium: 3.6 mmol/L (ref 3.5–5.1)
Sodium: 136 mmol/L (ref 135–145)

## 2021-02-27 LAB — URINALYSIS, ROUTINE W REFLEX MICROSCOPIC
Bilirubin Urine: NEGATIVE
Glucose, UA: NEGATIVE mg/dL
Hgb urine dipstick: NEGATIVE
Ketones, ur: 5 mg/dL — AB
Nitrite: NEGATIVE
Protein, ur: NEGATIVE mg/dL
Specific Gravity, Urine: 1.023 (ref 1.005–1.030)
pH: 5 (ref 5.0–8.0)

## 2021-02-27 MED ORDER — PHENAZOPYRIDINE HCL 200 MG PO TABS
200.0000 mg | ORAL_TABLET | Freq: Three times a day (TID) | ORAL | 0 refills | Status: DC | PRN
Start: 2021-02-27 — End: 2021-06-08

## 2021-02-27 NOTE — Discharge Instructions (Addendum)
You were seen in the ED today with pain with urination. I will have you complete your Cipro medication. You appear to have some urine infection on labs. I have sent in a medication to help with pain symptoms and listed the contact information for a Urologist.

## 2021-02-27 NOTE — ED Triage Notes (Signed)
Pt here for urinary retention. Pt seen by PCP on the 18th and started on Cipro (pt not sure why) but states he still cannot fully empty bladder.

## 2021-02-27 NOTE — ED Provider Notes (Signed)
Emergency Department Provider Note   I have reviewed the triage vital signs and the nursing notes.   HISTORY  Chief Complaint Urinary Frequency   HPI [name] is a 55 y.o. male with past medical history reviewed presents to the emergency department with urinary frequency and dysuria.  He has sensation of incomplete bladder emptying.  Symptoms began 2 weeks ago after sex.  No pain during sex.  He has had some general soreness in his testicles worse when he urinates.  He saw his primary care doctor and states that lab work was done and he was started on Cipro has been taking that for the past 2 days without relief.  Denies any urethral discharge.  No abdominal or flank pain.  No radiation of symptoms or other modifying factors.  Past Medical History:  Diagnosis Date   Anxiety    Borderline diabetes    Bulging disc    Chronic back pain    Closed intra-articular fracture of distal end of left tibia    Diabetes mellitus without complication (HCC)    Hypercholesteremia    Myocardial infarction [facility])    Pancreatitis     Patient Active Problem List   Diagnosis Date Noted   Upper abdominal pain 02/05/2020   Abnormal weight loss 02/05/2020   Constipation 02/05/2020   Hypertriglyceridemia 12/06/2019   Fracture of tibial shaft, left, closed 10/02/2018   Nicotine dependence 10/02/2018   Closed intra-articular fracture of distal end of left tibia    Type 2 diabetes mellitus with hemoglobin A1c goal of less than 7.5% (HCC)    Pre-op chest exam    Closed fracture of left distal fibula 09/29/2018   Angina, 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 05/08/2017   History of colonic polyps    Diverticulosis of colon without hemorrhage    ST-segment elevation myocardial infarction (STEMI) of inferior wall ([place]) 03/14/2014   Presence of bare metal stent in right coronary artery: VeriFlex BMS 5.0 mm x 15 mm (5.6 mm) 03/14/2014    Class: Status post   CAD S/P percutaneous  coronary angioplasty 03/14/2014   Chest pain 03/13/2014   Pancreatitis 01/19/2014   Hyperlipidemia associated with type 2 diabetes mellitus ([place]) 01/19/2014   Diabetes mellitus ([place]) 01/19/2014    Past Surgical History:  Procedure Laterality Date   ABDOMINAL SURGERY  1990   adhesions, blockage.    APPENDECTOMY     BACK SURGERY     CARDIAC CATHETERIZATION  2003   "trivial coronary artery disease," normal EF   COLONOSCOPY N/A 04/16/2015   Procedure: COLONOSCOPY;  Surgeon: Daneil Dolin, MD;  [facility];  Service: Endoscopy;  Laterality: N/A;  12:45 PM   COLONOSCOPY WITH PROPOFOL N/A 06/28/2018   Rourk: Diverticulosis, 2 tubular adenomas removed.  Next colonoscopy in 5 years.   LEFT HEART CATH AND CORONARY ANGIOGRAPHY N/A 05/09/2017   Procedure: LEFT HEART CATH AND CORONARY ANGIOGRAPHY;  Surgeon: Leonie Man, MD;  Location: [facility];  Service: Cardiovascular;  Laterality: N/A;   LEFT HEART CATHETERIZATION WITH CORONARY ANGIOGRAM N/A 03/13/2014   Procedure: LEFT HEART CATHETERIZATION WITH CORONARY ANGIOGRAM;  Surgeon: Leonie Man, MD;  Location: [facility] CATH LAB;  Service: Cardiovascular;  Laterality: N/A;   OPEN REDUCTION INTERNAL FIXATION (ORIF) TIBIA/FIBULA FRACTURE Left 10/01/2018   Procedure: OPEN REDUCTION INTERNAL FIXATION LEFT PILON FRACTURE;  Surgeon: Altamese Jeddito, MD;  Location: [facility];  Service: Orthopedics;  Laterality: Left;   POLYPECTOMY  06/28/2018   Procedure: POLYPECTOMY;  Surgeon: Daneil Dolin, MD;  [facility];  Service: Endoscopy;;  (colon)    Allergies Patient has no known allergies.  Family History  Problem Relation Age of Onset   Diabetes Father    Hyperlipidemia Father    Hypertension Father    Hypertension Mother    Hyperlipidemia Mother    Hyperlipidemia Sister    Hypertension Sister    Diabetes Sister     Social History Social History   Tobacco Use   Smoking status: Every Day    Packs/day: 0.50    Years:  30.00    Pack years: 15.00    Types: Cigarettes    Start date: 10/25/1977   Smokeless tobacco: Never  Substance Use Topics   Alcohol use: Yes    Alcohol/week: 6.0 standard drinks    Types: 6 Cans of beer per week   Drug use: No    Review of Systems  Constitutional: No fever/chills Eyes: No visual changes. ENT: No sore throat. Cardiovascular: Denies chest pain. Respiratory: Denies shortness of breath. Gastrointestinal: No abdominal pain.  No nausea, no vomiting.  No diarrhea.  No constipation. Genitourinary: Positive dysuria and hesitancy.  Musculoskeletal: Negative for back pain. Skin: Negative for rash. Neurological: Negative for headaches, focal weakness or numbness.  10-point ROS otherwise negative.  ____________________________________________   PHYSICAL EXAM:  VITAL SIGNS: ED Triage Vitals  Enc Vitals Group     BP 02/27/21 0602 132/82     Pulse Rate 02/27/21 0602 65     Resp 02/27/21 0602 18     Temp 02/27/21 0602 98.3 F (36.8 C)     Temp Source 02/27/21 0602 Oral     SpO2 02/27/21 0602 97 %     Weight 02/27/21 0603 215 lb (97.5 kg)     Height 02/27/21 0603 '6\' 1"'$  (1.854 m)   Constitutional: Alert and oriented. Well appearing and in no acute distress. Eyes: Conjunctivae are normal.  Head: Atraumatic. Nose: No congestion/rhinnorhea. Mouth/Throat: Mucous membranes are moist.   Neck: No stridor.  Cardiovascular: Normal rate, regular rhythm. Good peripheral circulation. Grossly normal heart sounds.   Respiratory: Normal respiratory effort.  No retractions. Lungs CTAB. Gastrointestinal: Soft and nontender. No distention.  Musculoskeletal: No lower extremity tenderness nor edema. No gross deformities of extremities. Neurologic:  Normal speech and language. No gross focal neurologic deficits are appreciated.  Skin:  Skin is warm, dry and intact. No rash noted.  ____________________________________________   LABS (all labs ordered are listed, but only abnormal  results are displayed)  Labs Reviewed  URINALYSIS, ROUTINE W REFLEX MICROSCOPIC - Abnormal; Notable for the following components:      Result Value   APPearance HAZY (*)    Ketones, ur 5 (*)    Leukocytes,Ua MODERATE (*)    Bacteria, UA RARE (*)    All other components within normal limits  BASIC METABOLIC PANEL - Abnormal; Notable for the following components:   Glucose, Bld 156 (*)    All other components within normal limits  CBC WITH DIFFERENTIAL/PLATELET - Abnormal; Notable for the following components:   WBC 11.5 (*)    Abs Immature Granulocytes 0.11 (*)    All other components within normal limits  URINE CULTURE  GC/CHLAMYDIA PROBE AMP ([place]) NOT AT [facility]    ____________________________________________   PROCEDURES  Procedure(s) performed:   Procedures  None  ____________________________________________   INITIAL IMPRESSION / ASSESSMENT AND PLAN / ED COURSE  Pertinent labs &  imaging results that were available during my care of the patient were reviewed by me and considered in my medical decision making (see chart for details).   Patient presents emergency department with dysuria and frequency with urgency.  Symptoms began in the setting of sexual activity but no urethral discharge.  He is on Cipro for the past 2 days but no relief in symptoms.  Bladder scan performed here with 40 mL in the bladder.  No retention. Plan for UA, STD testing, labs, and reassess. Some testicle soreness noted but history not consistent with torsion.   UA reviewed. Plan to continue on Cipro and follow urine culture and STD screening. Urology referral placed.  ____________________________________________  FINAL CLINICAL IMPRESSION(S) / ED DIAGNOSES  Final diagnoses:  Urinary frequency    NEW OUTPATIENT MEDICATIONS STARTED DURING THIS VISIT:  Discharge Medication List as of 02/27/2021  7:08 AM     START taking these medications   Details  phenazopyridine (PYRIDIUM) 200 MG  tablet Take 1 tablet (200 mg total) by mouth 3 (three) times daily as needed for pain., Starting Sat 02/27/2021, Normal        Note:  This document was prepared using Dragon voice recognition software and may include unintentional dictation errors.  Nanda Quinton, MD, [facility] Emergency Medicine    Laurice Kimmons, [facility], MD 03/06/21 [phone]

## 2021-02-28 LAB — URINE CULTURE: Culture: NO GROWTH

## 2021-03-01 LAB — GC/CHLAMYDIA PROBE AMP (~~LOC~~) NOT AT ARMC
Chlamydia: NEGATIVE
Comment: NEGATIVE
Comment: NORMAL
Neisseria Gonorrhea: NEGATIVE

## 2021-03-01 NOTE — Progress Notes (Signed)
Cardiology Office Note  Date: 03/02/2021   ID: Jayk, Mccommon January 20, 1966, MRN [id number]  PCP:  Sharilyn Sites, MD  Cardiologist:  Carlyle Dolly, MD Electrophysiologist:  None   Chief Complaint: 92-monthfollow-up  History of Present Illness: Thomas DJERETT TALAVERAis a 55y.o. male with a history of CAD/MI, DM2, hyperlipidemia  History of previous inferior STEMI 2015 with BMS to RCA, LVEF 45 to 50% by LV gram.  Subsequent cath October 2018 with patent coronary arteries and patent RCA stent.  Had a subsequent stress test  September 2021 showing no ischemia.  He was last seen by Dr. BHarl Bowieon 09/29/2020 for follow-up on CAD, HLD.  Had no recent chest pain, shortness of breath, DOE.  He was compliant with medications.  He was tolerating pravastatin.  He had elevated triglycerides by previous labs.  He was to have repeat labs but these were not done.  Previous triglycerides were 1120 July 2021.  He had made drastic changes in his diet and hemoglobin A1c had decreased from 10 down to 7.  He was remaining on statin and fish oil.  Recent labs were requested from PCP.  Here for 638-monthollow-up today.  He denies any issues in the interim other than having a recent urinary tract infection being treated with Cipro.  He denies any recent anginal or exertional symptoms, palpitations or arrhythmias, orthostatic symptoms, CVA or TIA-like symptoms, PND, orthopnea, bleeding issues, claudication-like symptoms, DVT or PE-like symptoms, or lower extremity edema.  At previous visit he was to have repeat lipid labs but they were not done.  He states he recently had some lab work at Dr. GoDelanna Ahmadiffice.  He states his sugar is under reasonable control.  Recent random glucose 156 on labs on 02/27/2021.  EKG today normal sinus rhythm at a rate of 63.  Blood pressure well controlled at 115/78.  He is seeing endocrinology for his diabetes.  He has an upcoming visit on September 1 with Dr. NiDorris Fetch Current cardiac regimen  includes; aspirin 81 mg daily, atorvastatin 40 mg daily, Lasix 20 mg as needed, Imdur 60 mg daily, lisinopril 2.5 mg daily, sublingual nitroglycerin as needed.  Past Medical History:  Diagnosis Date   Anxiety    Borderline diabetes    Bulging disc    Chronic back pain    Closed intra-articular fracture of distal end of left tibia    Diabetes mellitus without complication (HCC)    Hypercholesteremia    Myocardial infarction ([facility]   Pancreatitis     Past Surgical History:  Procedure Laterality Date   ABDOMINAL SURGERY  1990   adhesions, blockage.    APPENDECTOMY     BACK SURGERY     CARDIAC CATHETERIZATION  2003   "trivial coronary artery disease," normal EF   COLONOSCOPY N/A 04/16/2015   Procedure: COLONOSCOPY;  Surgeon: RoDaneil DolinMD;  [facility];  Service: Endoscopy;  Laterality: N/A;  12:45 PM   COLONOSCOPY WITH PROPOFOL N/A 06/28/2018   Rourk: Diverticulosis, 2 tubular adenomas removed.  Next colonoscopy in 5 years.   LEFT HEART CATH AND CORONARY ANGIOGRAPHY N/A 05/09/2017   Procedure: LEFT HEART CATH AND CORONARY ANGIOGRAPHY;  Surgeon: HaLeonie ManMD;  [facility];  Service: Cardiovascular;  Laterality: N/A;   LEFT HEART CATHETERIZATION WITH CORONARY ANGIOGRAM N/A 03/13/2014   Procedure: LEFT HEART CATHETERIZATION WITH CORONARY ANGIOGRAM;  Surgeon: DaLeonie ManMD;  [facility];  Service:  Cardiovascular;  Laterality: N/A;   OPEN REDUCTION INTERNAL FIXATION (ORIF) TIBIA/FIBULA FRACTURE Left 10/01/2018   Procedure: OPEN REDUCTION INTERNAL FIXATION LEFT PILON FRACTURE;  Surgeon: Altamese Walker, MD;  Location: [place];  Service: Orthopedics;  Laterality: Left;   POLYPECTOMY  06/28/2018   Procedure: POLYPECTOMY;  Surgeon: Daneil Dolin, MD;  [facility];  Service: Endoscopy;;  (colon)    Current Outpatient Medications  Medication Sig Dispense Refill   aspirin 81 MG chewable tablet Chew 1 tablet (81 mg total) by  mouth daily.     atorvastatin (LIPITOR) 40 MG tablet Take 40 mg by mouth at bedtime.     furosemide (LASIX) 20 MG tablet Take 1 tablet (20 mg total) by mouth daily as needed. 90 tablet 3   ibuprofen (ADVIL,MOTRIN) 200 MG tablet Take 800 mg by mouth every 6 (six) hours as needed for fever, headache or mild pain.      isosorbide mononitrate (IMDUR) 60 MG 24 hr tablet TAKE 1 TABLET(60 MG) BY MOUTH DAILY 30 tablet 4   LANTUS SOLOSTAR 100 UNIT/ML Solostar Pen Inject 14 Units into the skin at bedtime.     LINZESS 145 MCG CAPS capsule TAKE 1 CAPSULE(145 MCG) BY MOUTH DAILY BEFORE BREAKFAST 30 capsule 3   lisinopril (ZESTRIL) 2.5 MG tablet TAKE 1 TABLET(2.5 MG) BY MOUTH DAILY 90 tablet 3   metFORMIN (GLUCOPHAGE) 1000 MG tablet Take 1,000 mg by mouth 2 (two) times daily.     Multiple Vitamin (MULTIVITAMIN) tablet Take 1 tablet by mouth daily.     nitroGLYCERIN (NITROSTAT) 0.4 MG SL tablet Place 1 tablet (0.4 mg total) under the tongue every 5 (five) minutes x 3 doses as needed for chest pain (if no relief after 3rd dose, proceed to the ED for an evaluation or call 911). 25 tablet 3   Omega-3 Fatty Acids (SALMON OIL-1000 PO) Take 1 capsule by mouth daily.     oxyCODONE-acetaminophen (PERCOCET/ROXICET) 5-325 MG tablet Take 1-2 tablets by mouth every 4 (four) hours as needed.     phenazopyridine (PYRIDIUM) 200 MG tablet Take 1 tablet (200 mg total) by mouth 3 (three) times daily as needed for pain. 6 tablet 0   zolpidem (AMBIEN) 10 MG tablet Take 10 mg by mouth at bedtime as needed.     No current facility-administered medications for this visit.   Allergies:  Patient has no known allergies.   Social History: The patient  reports that he has been smoking cigarettes. He started smoking about 43 years ago. He has a 15.00 pack-year smoking history. He has never used smokeless tobacco. He reports current alcohol use of about 6.0 standard drinks per week. He reports that he does not use drugs.   Family History:  The patient's family history includes Diabetes in his father and sister; Hyperlipidemia in his father, mother, and sister; Hypertension in his father, mother, and sister.   ROS:  Please see the history of present illness. Otherwise, complete review of systems is positive for none.  All other systems are reviewed and negative.   Physical Exam: VS:  BP 115/78   Pulse 66   Ht 6' (1.829 m)   Wt 213 lb 12.8 oz (97 kg)   SpO2 95%   BMI 29.00 kg/m , BMI Body mass index is 29 kg/m.  Wt Readings from Last 3 Encounters:  03/02/21 213 lb 12.8 oz (97 kg)  02/27/21 215 lb (97.5 kg)  09/01/20 212 lb (96.2 kg)    General: Patient appears  comfortable at rest. Neck: Supple, no elevated JVP or carotid bruits, no thyromegaly. Lungs: Clear to auscultation, nonlabored breathing at rest. Cardiac: Regular rate and rhythm, no S3 or significant systolic murmur, no pericardial rub. Extremities: No pitting edema, distal pulses 2+. Skin: Warm and dry. Musculoskeletal: No kyphosis. Neuropsychiatric: Alert and oriented x3, affect grossly appropriate.  ECG: March 02, 2021 EKG normal sinus rhythm rate of 63.  No ST or T wave abnormalities, no ectopy noted   Recent Labwork: 02/27/2021: BUN 15; Creatinine, Ser 0.92; Hemoglobin 16.3; Platelets 184; Potassium 3.6; Sodium 136     Component Value Date/Time   CHOL 345 (H) 01/20/2014 0528   TRIG 1,132 (H) 01/20/2014 0528   HDL 20 (L) 01/20/2014 0528   CHOLHDL 17.3 01/20/2014 0528   VLDL UNABLE TO CALCULATE IF TRIGLYCERIDE OVER 400 mg/dL 01/20/2014 0528   LDLCALC UNABLE TO CALCULATE IF TRIGLYCERIDE OVER 400 mg/dL 01/20/2014 [id number]    Other Studies Reviewed Today:  02/2017 nuclear stress No diagnostic ST segment changes to indicate ischemia. Small, mild intensity, apical to basal inferior defect is partially reversible and suggestive of ischemia, although there is gut radiotracer uptake artifact adjacent to the inferior wall on rest imaging which could also be  contributing to the defect reversibility. This is a low risk study. Nuclear stress EF: 63%.     04/2017 cath   Angiographically normal coronary arteries Dist RCA BMS (VeriFlex 5.0 x 15), 0 %stenosed. The left ventricular systolic function is normal. The left ventricular ejection fraction is 50-55% by visual estimate. LV end diastolic pressure is moderately elevated.   Patient essentially has intra-graphic normal coronary arteries with widely RCA.  No culprit lesion to explain inferior ischemia. Elevated LVEDP could explain microvascular ischemia.   Otherwise consider non-anginal etiology for chest pain.     Plan: Discharge home today after bed rest with TR band removal.   03/2020 nuclear stress No diagnostic ST segment changes to indicate ischemia. Medium sized, moderate intensity, fixed apical to basal inferior defect that is more prominent at rest and consistent with soft tissue attenuation versus scar. No substantial ischemic territories. This is a low risk study. Nuclear stress EF: 51%.       Assessment and Plan:  1. CAD in native artery   2. Mixed hyperlipidemia    1. CAD in native artery Denies any recent anginal or exertional symptoms.  Continue aspirin 81 mg daily, atorvastatin 40 mg daily, Lasix 20 mg as needed, Imdur 60 mg daily, sublingual nitroglycerin as needed  2. Mixed hyperlipidemia Continue to atorvastatin 40 mg daily.  Patient states he had recent lab work at Dr. Delanna Ahmadi office.  Please request labs from PCP.  If lipids not done please order FLP's and LFTs.  Previous triglycerides were elevated at 1120 last year.  3.  Type 2 diabetes. Recent random glucose on 02/27/2021 156.  Currently on Lantus 14 units at bedtime.  Currently on metformin 1000 mg p.o. twice daily.  Currently on lisinopril 2.5 mg daily.  Has an upcoming follow-up with Dr. Dorris Fetch on March 11, 2021  Medication Adjustments/Labs and Tests Ordered: Current medicines are reviewed at length with  the patient today.  Concerns regarding medicines are outlined above.   Disposition: Follow-up with Dr. Harl Bowie or APP 6 months  Signed, Levell July, NP 03/02/2021 1:31 PM    [facility] [place], [place], [place] [address] Phone: [phone]; Fax: [phone]

## 2021-03-02 ENCOUNTER — Ambulatory Visit (INDEPENDENT_AMBULATORY_CARE_PROVIDER_SITE_OTHER): Payer: Medicare Other | Admitting: Family Medicine

## 2021-03-02 ENCOUNTER — Encounter: Payer: Self-pay | Admitting: Family Medicine

## 2021-03-02 VITALS — BP 115/78 | HR 66 | Ht 72.0 in | Wt 213.8 lb

## 2021-03-02 DIAGNOSIS — E782 Mixed hyperlipidemia: Secondary | ICD-10-CM

## 2021-03-02 DIAGNOSIS — I2510 Atherosclerotic heart disease of native coronary artery without angina pectoris: Secondary | ICD-10-CM

## 2021-03-02 NOTE — Patient Instructions (Signed)
Medication Instructions:  Continue all current medications.   Labwork: none  Testing/Procedures: none  Follow-Up: 6 months   Any Other Special Instructions Will Be Listed Below (If Applicable).   If you need a refill on your cardiac medications before your next appointment, please call your pharmacy.  

## 2021-03-02 NOTE — Addendum Note (Signed)
Addended by: Laurine Blazer on: 03/02/2021 04:38 PM   Modules accepted: Orders

## 2021-03-05 ENCOUNTER — Encounter: Payer: Self-pay | Admitting: *Deleted

## 2021-03-11 ENCOUNTER — Ambulatory Visit: Payer: Self-pay | Admitting: "Endocrinology

## 2021-03-26 DIAGNOSIS — G894 Chronic pain syndrome: Secondary | ICD-10-CM | POA: Diagnosis not present

## 2021-03-26 DIAGNOSIS — Z23 Encounter for immunization: Secondary | ICD-10-CM | POA: Diagnosis not present

## 2021-03-29 DIAGNOSIS — M25511 Pain in right shoulder: Secondary | ICD-10-CM | POA: Diagnosis not present

## 2021-03-29 DIAGNOSIS — G8929 Other chronic pain: Secondary | ICD-10-CM | POA: Diagnosis not present

## 2021-03-29 DIAGNOSIS — M7581 Other shoulder lesions, right shoulder: Secondary | ICD-10-CM | POA: Diagnosis not present

## 2021-03-29 DIAGNOSIS — M19011 Primary osteoarthritis, right shoulder: Secondary | ICD-10-CM | POA: Diagnosis not present

## 2021-03-30 ENCOUNTER — Ambulatory Visit: Payer: Medicare Other | Admitting: Urology

## 2021-03-30 DIAGNOSIS — R300 Dysuria: Secondary | ICD-10-CM

## 2021-03-30 NOTE — Progress Notes (Deleted)
Assessment: 1. Dysuria      Plan: ***  Chief Complaint: No chief complaint on file.   History of Present Illness:  [name] is a 54 y.o. year old male who is seen in consultation from Sharilyn Sites, MD  for evaluation of ***.   Past Medical History:  Past Medical History:  Diagnosis Date   Anxiety    Borderline diabetes    Bulging disc    Chronic back pain    Closed intra-articular fracture of distal end of left tibia    Diabetes mellitus without complication (HCC)    Hypercholesteremia    Myocardial infarction ([place])    Pancreatitis     Past Surgical History:  Past Surgical History:  Procedure Laterality Date   ABDOMINAL SURGERY  1990   adhesions, blockage.    APPENDECTOMY     BACK SURGERY     CARDIAC CATHETERIZATION  2003   "trivial coronary artery disease," normal EF   COLONOSCOPY N/A 04/16/2015   Procedure: COLONOSCOPY;  Surgeon: Daneil Dolin, MD;  [facility];  Service: Endoscopy;  Laterality: N/A;  12:45 PM   COLONOSCOPY WITH PROPOFOL N/A 06/28/2018   Rourk: Diverticulosis, 2 tubular adenomas removed.  Next colonoscopy in 5 years.   LEFT HEART CATH AND CORONARY ANGIOGRAPHY N/A 05/09/2017   Procedure: LEFT HEART CATH AND CORONARY ANGIOGRAPHY;  Surgeon: Leonie Man, MD;  Location: [place] [facility];  Service: Cardiovascular;  Laterality: N/A;   LEFT HEART CATHETERIZATION WITH CORONARY ANGIOGRAM N/A 03/13/2014   Procedure: LEFT HEART CATHETERIZATION WITH CORONARY ANGIOGRAM;  Surgeon: Leonie Man, MD;  [facility];  Service: Cardiovascular;  Laterality: N/A;   OPEN REDUCTION INTERNAL FIXATION (ORIF) TIBIA/FIBULA FRACTURE Left 10/01/2018   Procedure: OPEN REDUCTION INTERNAL FIXATION LEFT PILON FRACTURE;  Surgeon: Altamese Bennington, MD;  Location: [place];  Service: Orthopedics;  Laterality: Left;   POLYPECTOMY  06/28/2018   Procedure: POLYPECTOMY;  Surgeon: Daneil Dolin, MD;  [facility];  Service: Endoscopy;;  (colon)     Allergies:  No Known Allergies  Family History:  Family History  Problem Relation Age of Onset   Diabetes Father    Hyperlipidemia Father    Hypertension Father    Hypertension Mother    Hyperlipidemia Mother    Hyperlipidemia Sister    Hypertension Sister    Diabetes Sister     Social History:  Social History   Tobacco Use   Smoking status: Every Day    Packs/day: 0.50    Years: 30.00    Pack years: 15.00    Types: Cigarettes    Start date: 10/25/1977   Smokeless tobacco: Never  Substance Use Topics   Alcohol use: Yes    Alcohol/week: 6.0 standard drinks    Types: 6 Cans of beer per week   Drug use: No    Review of symptoms:  Constitutional:  Negative for unexplained weight loss, night sweats, fever, chills ENT:  Negative for nose bleeds, sinus pain, painful swallowing CV:  Negative for chest pain, shortness of breath, exercise intolerance, palpitations, loss of consciousness Resp:  Negative for cough, wheezing, shortness of breath GI:  Negative for nausea, vomiting, diarrhea, bloody stools GU:  Positives noted in HPI; otherwise negative for gross hematuria, dysuria, urinary incontinence Neuro:  Negative for seizures, poor balance, limb weakness, slurred speech Psych:  Negative for lack of energy, depression, anxiety Endocrine:  Negative for polydipsia, polyuria, symptoms of hypoglycemia (dizziness, hunger, sweating) Hematologic:  Negative for anemia, purpura, petechia, prolonged or excessive bleeding, use of anticoagulants  Allergic:  Negative for difficulty breathing or choking as a result of exposure to anything; no shellfish allergy; no allergic response (rash/itch) to materials, foods  Physical exam There were no vitals taken for this visit. GENERAL APPEARANCE:  Well appearing, well developed, well nourished, NAD HEENT: Atraumatic, Normocephalic, oropharynx clear. NECK: Supple without lymphadenopathy or thyromegaly. LUNGS: Clear to auscultation  bilaterally. HEART: Regular Rate and Rhythm without murmurs, gallops, or rubs. ABDOMEN: Soft, non-tender, No Masses. EXTREMITIES: Moves all extremities well.  Without clubbing, cyanosis, or edema. NEUROLOGIC:  Alert and oriented x 3, normal gait, CN II-XII grossly intact.  MENTAL STATUS:  Appropriate. BACK:  Non-tender to palpation.  No CVAT SKIN:  Warm, dry and intact.    Results: No results found for this or any previous visit (from the past 24 hour(s)).

## 2021-04-13 ENCOUNTER — Encounter: Payer: Self-pay | Admitting: Internal Medicine

## 2021-04-14 ENCOUNTER — Ambulatory Visit (INDEPENDENT_AMBULATORY_CARE_PROVIDER_SITE_OTHER): Payer: Medicare Other | Admitting: "Endocrinology

## 2021-04-14 ENCOUNTER — Encounter: Payer: Self-pay | Admitting: "Endocrinology

## 2021-04-14 ENCOUNTER — Other Ambulatory Visit: Payer: Self-pay

## 2021-04-14 VITALS — BP 104/76 | HR 60 | Ht 72.0 in | Wt 212.4 lb

## 2021-04-14 DIAGNOSIS — F17200 Nicotine dependence, unspecified, uncomplicated: Secondary | ICD-10-CM | POA: Diagnosis not present

## 2021-04-14 DIAGNOSIS — I10 Essential (primary) hypertension: Secondary | ICD-10-CM | POA: Insufficient documentation

## 2021-04-14 DIAGNOSIS — E1159 Type 2 diabetes mellitus with other circulatory complications: Secondary | ICD-10-CM | POA: Diagnosis not present

## 2021-04-14 DIAGNOSIS — E782 Mixed hyperlipidemia: Secondary | ICD-10-CM

## 2021-04-14 LAB — POCT GLYCOSYLATED HEMOGLOBIN (HGB A1C): HbA1c, POC (controlled diabetic range): 9 % — AB (ref 0.0–7.0)

## 2021-04-14 MED ORDER — BD PEN NEEDLE SHORT U/F 31G X 8 MM MISC: 1.0000 | 3 refills | Status: DC

## 2021-04-14 MED ORDER — LANTUS SOLOSTAR 100 UNIT/ML ~~LOC~~ SOPN: 40.0000 [IU] | PEN_INJECTOR | Freq: Every day | SUBCUTANEOUS | 1 refills | Status: DC

## 2021-04-14 NOTE — Patient Instructions (Signed)

## 2021-04-14 NOTE — Progress Notes (Signed)
Endocrinology Consult Note       04/14/2021, 4:14 PM   Subjective:    Patient ID: [name], male    DOB: 1966-06-28.  Jakob D Ledee is being seen in consultation for management of currently uncontrolled symptomatic diabetes requested by  Sharilyn Sites, MD.   Past Medical History:  Diagnosis Date   Anxiety    Borderline diabetes    Bulging disc    Chronic back pain    Closed intra-articular fracture of distal end of left tibia    Diabetes mellitus without complication ([place])    Hypercholesteremia    Myocardial infarction [facility])    Pancreatitis     Past Surgical History:  Procedure Laterality Date   ABDOMINAL SURGERY  1990   adhesions, blockage.    APPENDECTOMY     BACK SURGERY     CARDIAC CATHETERIZATION  2003   "trivial coronary artery disease," normal EF   COLONOSCOPY N/A 04/16/2015   Procedure: COLONOSCOPY;  Surgeon: Daneil Dolin, MD;  [facility];  Service: Endoscopy;  Laterality: N/A;  12:45 PM   COLONOSCOPY WITH PROPOFOL N/A 06/28/2018   [place]: Diverticulosis, 2 tubular adenomas removed.  Next colonoscopy in 5 years.   LEFT HEART CATH AND CORONARY ANGIOGRAPHY N/A 05/09/2017   Procedure: LEFT HEART CATH AND CORONARY ANGIOGRAPHY;  Surgeon: Leonie Man, MD;  Location: [facility];  Service: Cardiovascular;  Laterality: N/A;   LEFT HEART CATHETERIZATION WITH CORONARY ANGIOGRAM N/A 03/13/2014   Procedure: LEFT HEART CATHETERIZATION WITH CORONARY ANGIOGRAM;  Surgeon: Leonie Man, MD;  Location: [facility] CATH LAB;  Service: Cardiovascular;  Laterality: N/A;   OPEN REDUCTION INTERNAL FIXATION (ORIF) TIBIA/FIBULA FRACTURE Left 10/01/2018   Procedure: OPEN REDUCTION INTERNAL FIXATION LEFT PILON FRACTURE;  Surgeon: Altamese Ridgeway, MD;  Location: [facility];  Service: Orthopedics;  Laterality: Left;   POLYPECTOMY  06/28/2018   Procedure: POLYPECTOMY;  Surgeon: Daneil Dolin, MD;   Location: [facility];  Service: Endoscopy;;  (colon)    Social History   Socioeconomic History   Marital status: Single    Spouse name: Not on file   Number of children: Not on file   Years of education: Not on file   Highest education level: Not on file  Occupational History   Not on file  Tobacco Use   Smoking status: Every Day    Packs/day: 0.50    Years: 30.00    Pack years: 15.00    Types: Cigarettes    Start date: 10/25/1977   Smokeless tobacco: Never  Vaping Use   Vaping Use: Never used  Substance and Sexual Activity   Alcohol use: Yes    Alcohol/week: 6.0 standard drinks    Types: 6 Cans of beer per week   Drug use: No   Sexual activity: Yes  Other Topics Concern   Not on file  Social History Narrative   Not on file   Social Determinants of Health   Financial Resource Strain: Not on file  Food Insecurity: Not on file  Transportation Needs: Not on file  Physical Activity: Not on file  Stress: Not  on file  Social Connections: Not on file    Family History  Problem Relation Age of Onset   Diabetes Father    Hyperlipidemia Father    Hypertension Father    Hypertension Mother    Hyperlipidemia Mother    Hyperlipidemia Sister    Hypertension Sister    Diabetes Sister     Outpatient Encounter Medications as of 04/14/2021  Medication Sig   Albuterol Sulfate (PROAIR HFA IN) Inhale 2 puffs into the lungs as needed.   CRANBERRY PO Take 1 tablet by mouth every 14 (fourteen) days.   Insulin Pen Needle (B-D ULTRAFINE III SHORT PEN) 31G X 8 MM MISC 1 each by Does not apply route as directed.   aspirin 81 MG chewable tablet Chew 1 tablet (81 mg total) by mouth daily.   atorvastatin (LIPITOR) 40 MG tablet Take 40 mg by mouth at bedtime.   furosemide (LASIX) 20 MG tablet Take 1 tablet (20 mg total) by mouth daily as needed.   ibuprofen (ADVIL,MOTRIN) 200 MG tablet Take 800 mg by mouth every 6 (six) hours as needed for fever, headache or mild pain.    isosorbide  mononitrate (IMDUR) 60 MG 24 hr tablet TAKE 1 TABLET(60 MG) BY MOUTH DAILY   LANTUS SOLOSTAR 100 UNIT/ML Solostar Pen Inject 40 Units into the skin at bedtime.   LINZESS 145 MCG CAPS capsule TAKE 1 CAPSULE(145 MCG) BY MOUTH DAILY BEFORE BREAKFAST   lisinopril (ZESTRIL) 2.5 MG tablet TAKE 1 TABLET(2.5 MG) BY MOUTH DAILY   metFORMIN (GLUCOPHAGE) 1000 MG tablet Take 1,000 mg by mouth 2 (two) times daily.   Multiple Vitamin (MULTIVITAMIN) tablet Take 1 tablet by mouth daily.   nitroGLYCERIN (NITROSTAT) 0.4 MG SL tablet Place 1 tablet (0.4 mg total) under the tongue every 5 (five) minutes x 3 doses as needed for chest pain (if no relief after 3rd dose, proceed to the ED for an evaluation or call 911).   Omega-3 Fatty Acids (SALMON OIL-1000 PO) Take 1 capsule by mouth daily.   oxyCODONE-acetaminophen (PERCOCET/ROXICET) 5-325 MG tablet Take 1-2 tablets by mouth every 4 (four) hours as needed.   phenazopyridine (PYRIDIUM) 200 MG tablet Take 1 tablet (200 mg total) by mouth 3 (three) times daily as needed for pain. (Patient not taking: Reported on 04/14/2021)   triamcinolone ointment (KENALOG) 0.1 % Apply 1 application topically 2 (two) times daily. (Patient not taking: Reported on 04/14/2021)   zolpidem (AMBIEN) 10 MG tablet Take 10 mg by mouth at bedtime as needed.   [DISCONTINUED] LANTUS SOLOSTAR 100 UNIT/ML Solostar Pen Inject 40 Units into the skin at bedtime.   No facility-administered encounter medications on file as of 04/14/2021.    ALLERGIES: No Known Allergies  VACCINATION STATUS: Immunization History  Administered Date(s) Administered   Influenza,inj,Quad PF,6+ Mos 05/27/2014    Diabetes He presents for his initial diabetic visit. He has type 2 diabetes mellitus. Onset time: He was diagnosed at approximate age of 55 years. His disease course has been worsening. There are no hypoglycemic associated symptoms. Pertinent negatives for hypoglycemia include no confusion, headaches, pallor or  seizures. Associated symptoms include blurred vision, polydipsia and polyuria. Pertinent negatives for diabetes include no chest pain, no fatigue, no polyphagia and no weakness. There are no hypoglycemic complications. Symptoms are worsening. Diabetic complications include heart disease. (He is status post stent placement in 1 large coronary artery.) Risk factors for coronary artery disease include dyslipidemia, diabetes mellitus, family history, hypertension, male sex, sedentary lifestyle and tobacco exposure.  Current diabetic treatment includes insulin injections (He is currently on Lantus 14 units nightly, metformin 1000 mg p.o. twice daily.). His weight is fluctuating minimally. He is following a generally unhealthy diet. When asked about meal planning, he reported none. He has not had a previous visit with a dietitian. He never participates in exercise. (He did not bring any logs nor meter.  His point-of-care A1c is 9%.  He has a meter at home, reports verbally that his average blood glucose of 160+.  ) An ACE inhibitor/angiotensin II receptor blocker is being taken. Eye exam is current.  Hyperlipidemia This is a chronic problem. The problem is resistant. Exacerbating diseases include diabetes. Pertinent negatives include no chest pain, myalgias or shortness of breath. Current antihyperlipidemic treatment includes statins. Risk factors for coronary artery disease include dyslipidemia, diabetes mellitus, family history, male sex, hypertension and a sedentary lifestyle.  Hypertension This is a chronic problem. The current episode started more than 1 year ago. Associated symptoms include blurred vision. Pertinent negatives include no chest pain, headaches, neck pain, palpitations or shortness of breath. Risk factors for coronary artery disease include dyslipidemia, diabetes mellitus, smoking/tobacco exposure, sedentary lifestyle, male gender and family history. Past treatments include ACE inhibitors.     Review of Systems  Constitutional:  Negative for chills, fatigue, fever and unexpected weight change.  HENT:  Negative for dental problem, mouth sores and trouble swallowing.   Eyes:  Positive for blurred vision. Negative for visual disturbance.  Respiratory:  Negative for cough, choking, chest tightness, shortness of breath and wheezing.   Cardiovascular:  Negative for chest pain, palpitations and leg swelling.  Gastrointestinal:  Negative for abdominal distention, abdominal pain, constipation, diarrhea, nausea and vomiting.  Endocrine: Positive for polydipsia and polyuria. Negative for polyphagia.  Genitourinary:  Negative for dysuria, flank pain, hematuria and urgency.  Musculoskeletal:  Negative for back pain, gait problem, myalgias and neck pain.  Skin:  Negative for pallor, rash and wound.  Neurological:  Negative for seizures, syncope, weakness, numbness and headaches.  Psychiatric/Behavioral:  Negative for confusion and dysphoric mood.    Objective:    Vitals with BMI 04/14/2021 03/02/2021 02/27/2021  Height 6\' 0"  6\' 0"  -  Weight 212 lbs 6 oz 213 lbs 13 oz -  BMI 74.1 63.84 -  Systolic 536 468 032  Diastolic 76 78 78  Pulse 60 66 78    BP 104/76   Pulse 60   Ht 6' (1.829 m)   Wt 212 lb 6.4 oz (96.3 kg)   BMI 28.81 kg/m   Wt Readings from Last 3 Encounters:  04/14/21 212 lb 6.4 oz (96.3 kg)  03/02/21 213 lb 12.8 oz (97 kg)  02/27/21 215 lb (97.5 kg)     Physical Exam Constitutional:      General: He is not in acute distress.    Appearance: He is well-developed.  HENT:     Head: Normocephalic and atraumatic.  Neck:     Thyroid: No thyromegaly.     Trachea: No tracheal deviation.  Cardiovascular:     Rate and Rhythm: Normal rate.     Pulses:          Dorsalis pedis pulses are 1+ on the right side and 1+ on the left side.       Posterior tibial pulses are 1+ on the right side and 1+ on the left side.     Heart sounds: Normal heart sounds, S1 normal and S2  normal. No murmur heard.   No  gallop.  Pulmonary:     Effort: Pulmonary effort is normal. No respiratory distress.     Breath sounds: No wheezing.  Abdominal:     General: There is no distension.     Tenderness: There is no abdominal tenderness. There is no guarding.  Musculoskeletal:     Right shoulder: No swelling or deformity.     Cervical back: Normal range of motion and neck supple.  Skin:    General: Skin is warm and dry.     Findings: No rash.     Nails: There is no clubbing.  Neurological:     Mental Status: He is alert and oriented to person, place, and time.     Cranial Nerves: No cranial nerve deficit.     Sensory: No sensory deficit.     Gait: Gait normal.     Deep Tendon Reflexes: Reflexes are normal and symmetric.  Psychiatric:        Speech: Speech normal.        Behavior: Behavior is cooperative.     Comments: Patient is very hesitant and unconcerned in affect.      CMP ( most recent) CMP     Component Value Date/Time   NA 136 02/27/2021 0628   NA 137 08/28/2017 1222   K 3.6 02/27/2021 0628   CL 104 02/27/2021 0628   CO2 26 02/27/2021 0628   GLUCOSE 156 (H) 02/27/2021 0628   BUN 15 02/27/2021 0628   BUN 10 08/28/2017 1222   CREATININE 0.92 02/27/2021 0628   CALCIUM 9.2 02/27/2021 0628   PROT 6.3 (L) 02/06/2020 0035   ALBUMIN 4.0 02/06/2020 0035   AST 42 (H) 02/06/2020 0035   ALT 45 (H) 02/06/2020 0035   ALKPHOS 96 02/06/2020 0035   BILITOT 1.0 02/06/2020 0035   GFRNONAA >60 02/27/2021 0628   GFRAA >60 02/06/2020 0035     Diabetic Labs (most recent): Lab Results  Component Value Date   HGBA1C 9.0 (A) 04/14/2021   HGBA1C 8.0 (H) 10/02/2018   HGBA1C 8.9 (H) 03/14/2014     Lipid Panel ( most recent) Lipid Panel     Component Value Date/Time   CHOL 345 (H) 01/20/2014 0528   TRIG 1,132 (H) 01/20/2014 0528   HDL 20 (L) 01/20/2014 0528   CHOLHDL 17.3 01/20/2014 0528   VLDL UNABLE TO CALCULATE IF TRIGLYCERIDE OVER 400 mg/dL 01/20/2014 0528    LDLCALC UNABLE TO CALCULATE IF TRIGLYCERIDE OVER 400 mg/dL 01/20/2014 0528      Lab Results  Component Value Date   TSH 1.100 01/19/2014      Assessment & Plan:   1. Type 2 diabetes mellitus with vascular disease ([place])   - Nesanel D Biber has currently uncontrolled symptomatic type 2 DM since  55 years of age,  with most recent A1c of 9 %. Recent labs reviewed.   - I had a long discussion with him about the progressive nature of diabetes and the pathology behind its complications. -his diabetes is complicated by coronary artery disease, chronic heavy smoking, sedentary life and he remains at a high risk for more acute and chronic complications which include CAD, CVA, CKD, retinopathy, and neuropathy. These are all discussed in detail with him.  - I have counseled him on diet  and weight management  by adopting a carbohydrate restricted/protein rich diet. Patient is encouraged to switch to  unprocessed or minimally processed     complex starch and increased protein intake (animal or plant source), fruits, and  vegetables. -  he is advised to stick to a routine mealtimes to eat 3 meals  a day and avoid unnecessary snacks ( to snack only to correct hypoglycemia).   - he acknowledges that there is a room for improvement in his food and drink choices. - Suggestion is made for him to avoid simple carbohydrates  from his diet including Cakes, Sweet Desserts, Ice Cream, Soda (diet and regular), Sweet Tea, Candies, Chips, Cookies, Store Bought Juices, Alcohol in Excess of  1-2 drinks a day, Artificial Sweeteners,  Coffee Creamer, and "Sugar-free" Products. This will help patient to have more stable blood glucose profile and potentially avoid unintended weight gain.  - he will be scheduled with Jearld Fenton, RDN, CDE for diabetes education.  - I have approached him with the following individualized plan to manage  his diabetes and patient agrees:   -In light of his presentation with significant  glycemic burden evident with A1c of 9%, he will need a higher dose of insulin in order for him to achieve control of diabetes to target.  He is advised to increase his Lantus to 40 units nightly, approach to initiate monitoring of blood glucose at least twice a day-daily before breakfast and at bedtime until his next visit in 10 days. - he is warned not to take insulin without proper monitoring per orders.  - he is encouraged to call clinic for blood glucose levels less than 70 or above 200 mg /dl. - he is advised to continue metformin 1000 mg p.o. twice daily, therapeutically suitable for patient . -Reportedly, he did not tolerate Jardiance, Farxiga in the past and he does not want to start any of those medications. -He has had superhigh triglyceride numbers 1000+, current heavy smoker, not a suitable candidate for GLP-1 receptor agonist for now.  He will be assessed after repeat lipid panel on subsequent visits.    The patient was counseled on the dangers of tobacco use, and was advised to quit.  Reviewed strategies to maximize success, including removing cigarettes and smoking materials from environment.  - Specific targets for  A1c;  LDL, HDL,  and Triglycerides were discussed with the patient.  2) Blood Pressure /Hypertension:  his blood pressure is  controlled to target.   he is advised to continue his current medications including lisinopril 2.5 mg p.o. daily with breakfast . 3) Lipids/Hyperlipidemia:   Review of his recent lipid panel showed  controlled triglycerides in the past.  He is advised to continue Lipitor 40 mg p.o. nightly.  Side effects and precautions discussed with him.   This patient will benefit from plant predominant Whole [employer] lifestyle nutrition.  This is discussed in detail and recommended to the patient.  He voices that he cannot switch from meat based nutrition.   4)  Weight/Diet:  Body mass index is 28.81 kg/m.  -   clearly complicating his diabetes care.   he is  a  candidate for weight loss. I discussed with him the fact that loss of 5 - 10% of his  current body weight will have the most impact on his diabetes management.  Exercise, and detailed carbohydrates information provided  -  detailed on discharge instructions.  5) Chronic Care/Health Maintenance: 6) chronic smoker  -he  is on ACEI/ARB and Statin medications and  is encouraged to initiate and continue to follow up with Ophthalmology, Dentist,  Podiatrist at least yearly or according to recommendations, and advised to  quit smoking. I have recommended yearly flu  vaccine and pneumonia vaccine at least every 5 years; moderate intensity exercise for up to 150 minutes weekly; and  sleep for at least 7 hours a day.  - he is  advised to maintain close follow up with Sharilyn Sites, MD for primary care needs, as well as his other providers for optimal and coordinated care.   I spent 65 minutes in the care of the patient today including review of labs from [facility], Lipids, Thyroid Function, Hematology (current and previous including abstractions from other facilities); face-to-face time discussing  his blood glucose readings/logs, discussing hypoglycemia and hyperglycemia episodes and symptoms, medications doses, his options of short and long term treatment based on the latest standards of care / guidelines;  discussion about incorporating lifestyle medicine;  and documenting the encounter.     Please refer to Patient Instructions for Blood Glucose Monitoring and Insulin/Medications Dosing Guide"  in media tab for additional information. Please  also refer to " Patient Self Inventory" in the Media  tab for reviewed elements of pertinent patient history.  Kashius D Glastetter participated in the discussions, expressed understanding, and voiced agreement with the above plans.  All questions were answered to his satisfaction. he is encouraged to contact clinic should he have any questions or concerns prior to his return  visit.   Follow up plan: - Return in about 10 days (around 04/24/2021) for F/U with Meter and Logs Only - no Labs.  Glade Lloyd, MD [facility] [address] [place], [place] [address] Phone: [phone]  Fax: [phone]    04/14/2021, 4:14 PM  This note was partially dictated with voice recognition software. Similar sounding words can be transcribed inadequately or may not  be corrected upon review.

## 2021-04-23 DIAGNOSIS — G894 Chronic pain syndrome: Secondary | ICD-10-CM | POA: Diagnosis not present

## 2021-04-28 ENCOUNTER — Other Ambulatory Visit: Payer: Self-pay

## 2021-04-28 ENCOUNTER — Ambulatory Visit (INDEPENDENT_AMBULATORY_CARE_PROVIDER_SITE_OTHER): Payer: Medicare Other | Admitting: "Endocrinology

## 2021-04-28 ENCOUNTER — Encounter: Payer: Self-pay | Admitting: "Endocrinology

## 2021-04-28 VITALS — BP 110/78 | HR 68 | Ht 72.0 in | Wt 214.0 lb

## 2021-04-28 DIAGNOSIS — E782 Mixed hyperlipidemia: Secondary | ICD-10-CM

## 2021-04-28 DIAGNOSIS — E1159 Type 2 diabetes mellitus with other circulatory complications: Secondary | ICD-10-CM | POA: Diagnosis not present

## 2021-04-28 DIAGNOSIS — F17200 Nicotine dependence, unspecified, uncomplicated: Secondary | ICD-10-CM | POA: Diagnosis not present

## 2021-04-28 DIAGNOSIS — I10 Essential (primary) hypertension: Secondary | ICD-10-CM | POA: Diagnosis not present

## 2021-04-28 MED ORDER — CHOLECALCIFEROL 50 MCG (2000 UT) PO CAPS: 1.0000 | ORAL_CAPSULE | Freq: Every day | ORAL | 1 refills | Status: DC

## 2021-04-28 NOTE — Progress Notes (Signed)
04/14/2021, 4:14 PM  Endocrinology follow-up note   Subjective:    Patient ID: [name], male    DOB: May 13, 1966.  [name] is being seen in follow-up after he was seen in consultation for management of currently uncontrolled symptomatic diabetes requested by  Sharilyn Sites, MD.   Past Medical History:  Diagnosis Date  . Anxiety   . Borderline diabetes   . Bulging disc   . Chronic back pain   . Closed intra-articular fracture of distal end of left tibia   . Diabetes mellitus without complication ([place])   . Hypercholesteremia   . Myocardial infarction ([place])   . Pancreatitis     Past Surgical History:  Procedure Laterality Date  . ABDOMINAL SURGERY  1990   adhesions, blockage.   . APPENDECTOMY    . BACK SURGERY    . CARDIAC CATHETERIZATION  2003   "trivial coronary artery disease," normal EF  . COLONOSCOPY N/A 04/16/2015   Procedure: COLONOSCOPY;  Surgeon: Daneil Dolin, MD;  Location: [facility];  Service: Endoscopy;  Laterality: N/A;  12:45 PM  . COLONOSCOPY WITH PROPOFOL N/A 06/28/2018   [place]: Diverticulosis, 2 tubular adenomas removed.  Next colonoscopy in 5 years.  Marland Kitchen LEFT HEART CATH AND CORONARY ANGIOGRAPHY N/A 05/09/2017   Procedure: LEFT HEART CATH AND CORONARY ANGIOGRAPHY;  Surgeon: Leonie Man, MD;  Location: [facility];  Service: Cardiovascular;  Laterality: N/A;  . LEFT HEART CATHETERIZATION WITH CORONARY ANGIOGRAM N/A 03/13/2014   Procedure: LEFT HEART CATHETERIZATION WITH CORONARY ANGIOGRAM;  Surgeon: Leonie Man, MD;  Location: [facility] CATH LAB;  Service: Cardiovascular;  Laterality: N/A;  . OPEN REDUCTION INTERNAL FIXATION (ORIF) TIBIA/FIBULA FRACTURE Left 10/01/2018   Procedure: OPEN REDUCTION INTERNAL FIXATION LEFT PILON FRACTURE;  Surgeon: Altamese Ruskin, MD;  Location: [facility];  Service: Orthopedics;  Laterality: Left;  . POLYPECTOMY  06/28/2018   Procedure: POLYPECTOMY;  Surgeon: Daneil Dolin, MD;  Location: [facility];  Service: Endoscopy;;  (colon)    Social History   Socioeconomic History  . Marital status: Single    Spouse name: Not on file  . Number of children: Not on file  . Years of education: Not on file  . Highest education level: Not on file  Occupational History  . Not on file  Tobacco Use  . Smoking status: Every Day    Packs/day: 0.50    Years: 30.00    Pack years: 15.00    Types: Cigarettes    Start date: 10/25/1977  . Smokeless tobacco: Never  Vaping Use  . Vaping Use: Never used  Substance and Sexual Activity  . Alcohol use: Yes    Alcohol/week: 6.0 standard drinks    Types: 6 Cans of beer per week  . Drug use: No  . Sexual activity: Yes  Other Topics Concern  . Not on file  Social History Narrative  . Not on file   Social Determinants of Health   Financial Resource Strain: Not on file  Food Insecurity: Not on file  Transportation Needs: Not on file  Physical Activity: Not on file  Stress: Not on file  Social Connections: Not on file    Family History  Problem Relation Age of Onset  . Diabetes Father   . Hyperlipidemia Father   . Hypertension Father   . Hypertension Mother   . Hyperlipidemia Mother   . Hyperlipidemia Sister   . Hypertension Sister   .  Diabetes Sister     Outpatient Encounter Medications as of 04/14/2021  Medication Sig  . Albuterol Sulfate (PROAIR HFA IN) Inhale 2 puffs into the lungs as needed.  Marland Kitchen CRANBERRY PO Take 1 tablet by mouth every 14 (fourteen) days.  . Insulin Pen Needle (B-D ULTRAFINE III SHORT PEN) 31G X 8 MM MISC 1 each by Does not apply route as directed.  Marland Kitchen aspirin 81 MG chewable tablet Chew 1 tablet (81 mg total) by mouth daily.  Marland Kitchen atorvastatin (LIPITOR) 40 MG tablet Take 40 mg by mouth at bedtime.  . furosemide (LASIX) 20 MG tablet Take 1 tablet (20 mg total) by mouth daily as needed.  Marland Kitchen ibuprofen (ADVIL,MOTRIN) 200 MG tablet Take 800 mg by mouth every 6 (six) hours as needed for fever, headache or mild pain.    . isosorbide mononitrate (IMDUR) 60 MG 24 hr tablet TAKE 1 TABLET(60 MG) BY MOUTH DAILY  . LANTUS SOLOSTAR 100 UNIT/ML Solostar Pen Inject 40 Units into the skin at bedtime.  Marland Kitchen LINZESS 145 MCG CAPS capsule TAKE 1 CAPSULE(145 MCG) BY MOUTH DAILY BEFORE BREAKFAST  . lisinopril (ZESTRIL) 2.5 MG tablet TAKE 1 TABLET(2.5 MG) BY MOUTH DAILY  . metFORMIN (GLUCOPHAGE) 1000 MG tablet Take 1,000 mg by mouth 2 (two) times daily.  . Multiple Vitamin (MULTIVITAMIN) tablet Take 1 tablet by mouth daily.  . nitroGLYCERIN (NITROSTAT) 0.4 MG SL tablet Place 1 tablet (0.4 mg total) under the tongue every 5 (five) minutes x 3 doses as needed for chest pain (if no relief after 3rd dose, proceed to the ED for an evaluation or call 911).  . Omega-3 Fatty Acids (SALMON OIL-1000 PO) Take 1 capsule by mouth daily.  Marland Kitchen oxyCODONE-acetaminophen (PERCOCET/ROXICET) 5-325 MG tablet Take 1-2 tablets by mouth every 4 (four) hours as needed.  . phenazopyridine (PYRIDIUM) 200 MG tablet Take 1 tablet (200 mg total) by mouth 3 (three) times daily as needed for pain. (Patient not taking: Reported on 04/14/2021)  . triamcinolone ointment (KENALOG) 0.1 % Apply 1 application topically 2 (two) times daily. (Patient not taking: Reported on 04/14/2021)  . zolpidem (AMBIEN) 10 MG tablet Take 10 mg by mouth at bedtime as needed.  . [DISCONTINUED] LANTUS SOLOSTAR 100 UNIT/ML Solostar Pen Inject 40 Units into the skin at bedtime.   No facility-administered encounter medications on file as of 04/14/2021.    ALLERGIES: No Known Allergies  VACCINATION STATUS: Immunization History  Administered Date(s) Administered  . Influenza,inj,Quad PF,6+ Mos 05/27/2014    Diabetes He presents for his follow-up diabetic visit. He has type 2 diabetes mellitus. Onset time: He was diagnosed at approximate age of 81 years. His disease course has been improving. There are no hypoglycemic associated symptoms. Pertinent negatives for hypoglycemia include no  confusion, headaches, pallor or seizures. Associated symptoms include blurred vision, polydipsia and polyuria. Pertinent negatives for diabetes include no chest pain, no fatigue, no polyphagia and no weakness. There are no hypoglycemic complications. Symptoms are improving. Diabetic complications include heart disease. (He is status post stent placement in 1 large coronary artery.) Risk factors for coronary artery disease include dyslipidemia, diabetes mellitus, family history, hypertension, male sex, sedentary lifestyle and tobacco exposure. Current diabetic treatment includes insulin injections (He is currently on Lantus 14 units nightly, metformin 1000 mg p.o. twice daily.). His weight is fluctuating minimally. He is following a generally unhealthy diet. When asked about meal planning, he reported none. He has not had a previous visit with a dietitian. He never participates in exercise. His  home blood glucose trend is decreasing steadily. His breakfast blood glucose range is generally 140-180 mg/dl. His bedtime blood glucose range is generally 140-180 mg/dl. His overall blood glucose range is 140-180 mg/dl. (He presents with significant improvement in his glycemic profile.  His meter shows average blood glucose 149, no hypoglycemia.  His recent A1c was 9%.  ) An ACE inhibitor/angiotensin II receptor blocker is being taken. Eye exam is current.  Hyperlipidemia This is a chronic problem. The problem is resistant. Exacerbating diseases include diabetes. Pertinent negatives include no chest pain, myalgias or shortness of breath. Current antihyperlipidemic treatment includes statins. Risk factors for coronary artery disease include dyslipidemia, diabetes mellitus, family history, male sex, hypertension and a sedentary lifestyle.  Hypertension This is a chronic problem. The current episode started more than 1 year ago. Associated symptoms include blurred vision. Pertinent negatives include no chest pain, headaches,  neck pain, palpitations or shortness of breath. Risk factors for coronary artery disease include dyslipidemia, diabetes mellitus, smoking/tobacco exposure, sedentary lifestyle, male gender and family history. Past treatments include ACE inhibitors.    Review of Systems  Constitutional:  Negative for chills, fatigue, fever and unexpected weight change.  HENT:  Negative for dental problem, mouth sores and trouble swallowing.   Eyes:  Positive for blurred vision. Negative for visual disturbance.  Respiratory:  Negative for cough, choking, chest tightness, shortness of breath and wheezing.   Cardiovascular:  Negative for chest pain, palpitations and leg swelling.  Gastrointestinal:  Negative for abdominal distention, abdominal pain, constipation, diarrhea, nausea and vomiting.  Endocrine: Positive for polydipsia and polyuria. Negative for polyphagia.  Genitourinary:  Negative for dysuria, flank pain, hematuria and urgency.  Musculoskeletal:  Negative for back pain, gait problem, myalgias and neck pain.  Skin:  Negative for pallor, rash and wound.  Neurological:  Negative for seizures, syncope, weakness, numbness and headaches.  Psychiatric/Behavioral:  Negative for confusion and dysphoric mood.    Objective:    Vitals with BMI 04/14/2021 03/02/2021 02/27/2021  Height 6\' 0"  6\' 0"  -  Weight 212 lbs 6 oz 213 lbs 13 oz -  BMI 95.2 84.13 -  Systolic 244 010 272  Diastolic 76 78 78  Pulse 60 66 78    BP 104/76   Pulse 60   Ht 6' (1.829 m)   Wt 212 lb 6.4 oz (96.3 kg)   BMI 28.81 kg/m   Wt Readings from Last 3 Encounters:  04/14/21 212 lb 6.4 oz (96.3 kg)  03/02/21 213 lb 12.8 oz (97 kg)  02/27/21 215 lb (97.5 kg)     Physical Exam Constitutional:      General: He is not in acute distress.    Appearance: He is well-developed.  HENT:     Head: Normocephalic and atraumatic.  Neck:     Thyroid: No thyromegaly.     Trachea: No tracheal deviation.  Cardiovascular:     Rate and Rhythm:  Normal rate.     Pulses:          Dorsalis pedis pulses are 1+ on the right side and 1+ on the left side.       Posterior tibial pulses are 1+ on the right side and 1+ on the left side.     Heart sounds: Normal heart sounds, S1 normal and S2 normal. No murmur heard.   No gallop.  Pulmonary:     Effort: Pulmonary effort is normal. No respiratory distress.     Breath sounds: No wheezing.  Abdominal:  General: There is no distension.     Tenderness: There is no abdominal tenderness. There is no guarding.  Musculoskeletal:     Right shoulder: No swelling or deformity.     Cervical back: Normal range of motion and neck supple.  Skin:    General: Skin is warm and dry.     Findings: No rash.     Nails: There is no clubbing.  Neurological:     Mental Status: He is alert and oriented to person, place, and time.     Cranial Nerves: No cranial nerve deficit.     Sensory: No sensory deficit.     Gait: Gait normal.     Deep Tendon Reflexes: Reflexes are normal and symmetric.  Psychiatric:        Speech: Speech normal.        Behavior: Behavior is cooperative.     Comments: Patient is very hesitant and unconcerned in affect.      CMP ( most recent) CMP     Component Value Date/Time   NA 136 02/27/2021 0628   NA 137 08/28/2017 1222   K 3.6 02/27/2021 0628   CL 104 02/27/2021 0628   CO2 26 02/27/2021 0628   GLUCOSE 156 (H) 02/27/2021 0628   BUN 15 02/27/2021 0628   BUN 10 08/28/2017 1222   CREATININE 0.92 02/27/2021 0628   CALCIUM 9.2 02/27/2021 0628   PROT 6.3 (L) 02/06/2020 0035   ALBUMIN 4.0 02/06/2020 0035   AST 42 (H) 02/06/2020 0035   ALT 45 (H) 02/06/2020 0035   ALKPHOS 96 02/06/2020 0035   BILITOT 1.0 02/06/2020 0035   GFRNONAA >60 02/27/2021 0628   GFRAA >60 02/06/2020 0035     Diabetic Labs (most recent): Lab Results  Component Value Date   HGBA1C 9.0 (A) 04/14/2021   HGBA1C 8.0 (H) 10/02/2018   HGBA1C 8.9 (H) 03/14/2014     Lipid Panel ( most  recent) Lipid Panel     Component Value Date/Time   CHOL 345 (H) 01/20/2014 0528   TRIG 1,132 (H) 01/20/2014 0528   HDL 20 (L) 01/20/2014 0528   CHOLHDL 17.3 01/20/2014 0528   VLDL UNABLE TO CALCULATE IF TRIGLYCERIDE OVER 400 mg/dL 01/20/2014 0528   LDLCALC UNABLE TO CALCULATE IF TRIGLYCERIDE OVER 400 mg/dL 01/20/2014 0528      Lab Results  Component Value Date   TSH 1.100 01/19/2014      Assessment & Plan:   1. Type 2 diabetes mellitus with vascular disease ([name])   - [name] has currently uncontrolled symptomatic type 2 DM since  55 years of age.  He presents with significant improvement in his glycemic profile.  His meter shows average blood glucose 149, no hypoglycemia.  His recent A1c was 9%.  He reports significant symptomatic improvement. Recent labs reviewed.   - I had a long discussion with him about the progressive nature of diabetes and the pathology behind its complications. -his diabetes is complicated by coronary artery disease, chronic heavy smoking, sedentary life and he remains at a high risk for more acute and chronic complications which include CAD, CVA, CKD, retinopathy, and neuropathy. These are all discussed in detail with him.  - I have counseled him on diet  and weight management  by adopting a carbohydrate restricted/protein rich diet. Patient is encouraged to switch to  unprocessed or minimally processed     complex starch and increased protein intake (animal or plant source), fruits, and vegetables. -  he is  advised to stick to a routine mealtimes to eat 3 meals  a day and avoid unnecessary snacks ( to snack only to correct hypoglycemia).   - he acknowledges that there is a room for improvement in his food and drink choices. - Suggestion is made for him to avoid simple carbohydrates  from his diet including Cakes, Sweet Desserts, Ice Cream, Soda (diet and regular), Sweet Tea, Candies, Chips, Cookies, Store Bought Juices, Alcohol in Excess of  1-2  drinks a day, Artificial Sweeteners,  Coffee Creamer, and "Sugar-free" Products, Lemonade. This will help patient to have more stable blood glucose profile and potentially avoid unintended weight gain.   - he will be scheduled with Jearld Fenton, RDN, CDE for diabetes education.  - I have approached him with the following individualized plan to manage  his diabetes and patient agrees:   -In light of his presentation with continued current glycemic improvement, he will not need prandial insulin for now.  However, he will continue to need basal insulin, advised to continue Lantus 40 units nightly associated with monitoring of blood glucose twice a day-daily before request and at bedtime.  - he is warned not to take insulin without proper monitoring per orders.  - he is encouraged to call clinic for blood glucose levels less than 70 or above 200 mg /dl. - he is advised to continue metformin 1000 mg p.o. twice daily, therapeutically suitable for patient . -Reportedly, he did not tolerate Jardiance, Farxiga in the past and he does not want to start any of those medications. -He has had superhigh triglyceride numbers 1000+, current heavy smoker, not a suitable candidate for GLP-1 receptor agonist for now.  He will be assessed after repeat lipid panel on subsequent visits.    The patient was counseled on the dangers of tobacco use, and was advised to quit.  Reviewed strategies to maximize success, including removing cigarettes and smoking materials from environment.   - Specific targets for  A1c;  LDL, HDL,  and Triglycerides were discussed with the patient.  2) Blood Pressure /Hypertension: His blood pressure is controlled to target..   he is advised to continue his current medications including lisinopril 2.5 mg p.o. daily with breakfast . 3) Lipids/Hyperlipidemia:   Review of his recent lipid panel showed  controlled triglycerides in the past.  He is advised to continue Lipitor 40 mg p.o. nightly.     Side effects and precautions discussed with him.   This patient will benefit from plant predominant Whole Foods lifestyle nutrition.  This is discussed in detail and recommended to the patient.  He voices that he cannot switch from meat based nutrition.   4)  Weight/Diet:  Body mass index is 28.81 kg/m.  -      he is  a candidate for weight loss. I discussed with him the fact that loss of 5 - 10% of his  current body weight will have the most impact on his diabetes management.  Exercise, and detailed carbohydrates information provided  -  detailed on discharge instructions.  5) Chronic Care/Health Maintenance: 6) chronic smoker  -he  is on ACEI/ARB and Statin medications and  is encouraged to initiate and continue to follow up with Ophthalmology, Dentist,  Podiatrist at least yearly or according to recommendations, and advised to  quit smoking. I have recommended yearly flu vaccine and pneumonia vaccine at least every 5 years; moderate intensity exercise for up to 150 minutes weekly; and  sleep for at least 7 hours  a day.  -He is started on low-dose vitamin D3 2000 units daily.  - he is  advised to maintain close follow up with Sharilyn Sites, MD for primary care needs, as well as his other providers for optimal and coordinated care.    I spent 44 minutes in the care of the patient today including review of labs from [place], Lipids, Thyroid Function, Hematology (current and previous including abstractions from other facilities); face-to-face time discussing  his blood glucose readings/logs, discussing hypoglycemia and hyperglycemia episodes and symptoms, medications doses, his options of short and long term treatment based on the latest standards of care / guidelines;  discussion about incorporating lifestyle medicine;  and documenting the encounter.    Please refer to Patient Instructions for Blood Glucose Monitoring and Insulin/Medications Dosing Guide"  in media tab for additional information.  Please  also refer to " Patient Self Inventory" in the Media  tab for reviewed elements of pertinent patient history.  [name] participated in the discussions, expressed understanding, and voiced agreement with the above plans.  All questions were answered to his satisfaction. he is encouraged to contact clinic should he have any questions or concerns prior to his return visit.   Follow up plan: - Return in about 10 days (around 04/24/2021) for F/U with Meter and Logs Only - no Labs.  Glade Lloyd, MD [facility] [address] [place], [place] [address] Phone: [phone]  Fax: [phone]    04/14/2021, 4:14 PM  This note was partially dictated with voice recognition software. Similar sounding words can be transcribed inadequately or may not  be corrected upon review.

## 2021-04-28 NOTE — Patient Instructions (Signed)

## 2021-05-11 ENCOUNTER — Ambulatory Visit: Payer: Medicare Other | Admitting: Internal Medicine

## 2021-05-24 DIAGNOSIS — E1165 Type 2 diabetes mellitus with hyperglycemia: Secondary | ICD-10-CM | POA: Diagnosis not present

## 2021-05-24 DIAGNOSIS — G894 Chronic pain syndrome: Secondary | ICD-10-CM | POA: Diagnosis not present

## 2021-05-25 ENCOUNTER — Other Ambulatory Visit: Payer: Self-pay | Admitting: Cardiology

## 2021-05-25 ENCOUNTER — Other Ambulatory Visit: Payer: Self-pay

## 2021-05-25 MED ORDER — ISOSORBIDE MONONITRATE ER 60 MG PO TB24
ORAL_TABLET | ORAL | 4 refills | Status: DC
Start: 2021-05-25 — End: 2021-08-23

## 2021-05-25 NOTE — Telephone Encounter (Signed)
refilled Imdur 60 mg daily #30, RF:4 to [facility]

## 2021-05-29 ENCOUNTER — Emergency Department (HOSPITAL_COMMUNITY): Payer: Medicare Other

## 2021-05-29 ENCOUNTER — Emergency Department (HOSPITAL_COMMUNITY)
Admission: EM | Admit: 2021-05-29 | Discharge: 2021-05-29 | Disposition: A | Discharge status: Home / Self Care | Payer: Medicare Other | Attending: Emergency Medicine | Admitting: Emergency Medicine

## 2021-05-29 ENCOUNTER — Other Ambulatory Visit: Payer: Self-pay

## 2021-05-29 ENCOUNTER — Encounter (HOSPITAL_COMMUNITY): Payer: Self-pay | Admitting: Emergency Medicine

## 2021-05-29 DIAGNOSIS — Z951 Presence of aortocoronary bypass graft: Secondary | ICD-10-CM | POA: Diagnosis not present

## 2021-05-29 DIAGNOSIS — E119 Type 2 diabetes mellitus without complications: Secondary | ICD-10-CM | POA: Diagnosis not present

## 2021-05-29 DIAGNOSIS — Z23 Encounter for immunization: Secondary | ICD-10-CM | POA: Diagnosis not present

## 2021-05-29 DIAGNOSIS — M47812 Spondylosis without myelopathy or radiculopathy, cervical region: Secondary | ICD-10-CM | POA: Diagnosis not present

## 2021-05-29 DIAGNOSIS — S0083XA Contusion of other part of head, initial encounter: Secondary | ICD-10-CM | POA: Diagnosis not present

## 2021-05-29 DIAGNOSIS — I10 Essential (primary) hypertension: Secondary | ICD-10-CM | POA: Insufficient documentation

## 2021-05-29 DIAGNOSIS — Z7984 Long term (current) use of oral hypoglycemic drugs: Secondary | ICD-10-CM | POA: Diagnosis not present

## 2021-05-29 DIAGNOSIS — Z743 Need for continuous supervision: Secondary | ICD-10-CM | POA: Diagnosis not present

## 2021-05-29 DIAGNOSIS — Z7982 Long term (current) use of aspirin: Secondary | ICD-10-CM | POA: Insufficient documentation

## 2021-05-29 DIAGNOSIS — S0990XA Unspecified injury of head, initial encounter: Secondary | ICD-10-CM

## 2021-05-29 DIAGNOSIS — Z043 Encounter for examination and observation following other accident: Secondary | ICD-10-CM | POA: Diagnosis not present

## 2021-05-29 DIAGNOSIS — W208XXA Other cause of strike by thrown, projected or falling object, initial encounter: Secondary | ICD-10-CM | POA: Insufficient documentation

## 2021-05-29 DIAGNOSIS — Z79899 Other long term (current) drug therapy: Secondary | ICD-10-CM | POA: Insufficient documentation

## 2021-05-29 DIAGNOSIS — S3991XA Unspecified injury of abdomen, initial encounter: Secondary | ICD-10-CM | POA: Diagnosis not present

## 2021-05-29 DIAGNOSIS — F17210 Nicotine dependence, cigarettes, uncomplicated: Secondary | ICD-10-CM | POA: Insufficient documentation

## 2021-05-29 DIAGNOSIS — S01312A Laceration without foreign body of left ear, initial encounter: Secondary | ICD-10-CM | POA: Diagnosis not present

## 2021-05-29 DIAGNOSIS — W19XXXA Unspecified fall, initial encounter: Secondary | ICD-10-CM | POA: Diagnosis not present

## 2021-05-29 DIAGNOSIS — T07XXXA Unspecified multiple injuries, initial encounter: Secondary | ICD-10-CM | POA: Diagnosis not present

## 2021-05-29 DIAGNOSIS — R52 Pain, unspecified: Secondary | ICD-10-CM | POA: Diagnosis not present

## 2021-05-29 DIAGNOSIS — I2510 Atherosclerotic heart disease of native coronary artery without angina pectoris: Secondary | ICD-10-CM | POA: Insufficient documentation

## 2021-05-29 DIAGNOSIS — Z794 Long term (current) use of insulin: Secondary | ICD-10-CM | POA: Diagnosis not present

## 2021-05-29 DIAGNOSIS — R6889 Other general symptoms and signs: Secondary | ICD-10-CM | POA: Diagnosis not present

## 2021-05-29 LAB — COMPREHENSIVE METABOLIC PANEL
ALT: 23 U/L (ref 0–44)
AST: 25 U/L (ref 15–41)
Albumin: 4.2 g/dL (ref 3.5–5.0)
Alkaline Phosphatase: 71 U/L (ref 38–126)
Anion gap: 12 (ref 5–15)
BUN: 19 mg/dL (ref 6–20)
CO2: 24 mmol/L (ref 22–32)
Calcium: 9.2 mg/dL (ref 8.9–10.3)
Chloride: 102 mmol/L (ref 98–111)
Creatinine, Ser: 0.84 mg/dL (ref 0.61–1.24)
GFR, Estimated: >60 mL/min (ref >60)
Glucose, Bld: 137 mg/dL — ABNORMAL HIGH (ref 70–99)
Potassium: 3.6 mmol/L (ref 3.5–5.1)
Sodium: 138 mmol/L (ref 135–145)
Total Bilirubin: 0.7 mg/dL (ref 0.3–1.2)
Total Protein: 7.4 g/dL (ref 6.5–8.1)

## 2021-05-29 LAB — CBC WITH DIFFERENTIAL/PLATELET
Abs Immature Granulocytes: 0.07 10*3/uL (ref 0.00–0.07)
Basophils Absolute: 0 10*3/uL (ref 0.0–0.1)
Basophils Relative: 0 %
Eosinophils Absolute: 0.1 10*3/uL (ref 0.0–0.5)
Eosinophils Relative: 1 %
HCT: 44.7 % (ref 39.0–52.0)
Hemoglobin: 15.1 g/dL (ref 13.0–17.0)
Immature Granulocytes: 1 %
Lymphocytes Relative: 18 %
Lymphs Abs: 2.4 10*3/uL (ref 0.7–4.0)
MCH: 32.1 pg (ref 26.0–34.0)
MCHC: 33.8 g/dL (ref 30.0–36.0)
MCV: 95.1 fL (ref 80.0–100.0)
Monocytes Absolute: 0.9 10*3/uL (ref 0.1–1.0)
Monocytes Relative: 7 %
Neutro Abs: 9.9 10*3/uL — ABNORMAL HIGH (ref 1.7–7.7)
Neutrophils Relative %: 73 %
Platelets: 153 10*3/uL (ref 150–400)
RBC: 4.7 MIL/uL (ref 4.22–5.81)
RDW: 13.3 % (ref 11.5–15.5)
WBC: 13.4 10*3/uL — ABNORMAL HIGH (ref 4.0–10.5)
nRBC: 0 % (ref 0.0–0.2)

## 2021-05-29 MED ORDER — IOHEXOL 300 MG/ML  SOLN
100.0000 mL | Freq: Once | INTRAMUSCULAR | Status: AC | PRN
  Administered 2021-05-29: 100 mL via INTRAVENOUS

## 2021-05-29 MED ORDER — LIDOCAINE HCL (PF) 1 % IJ SOLN
10.0000 mL | Freq: Once | INTRAMUSCULAR | Status: AC
  Administered 2021-05-29: 10 mL
  Filled 2021-05-29: qty 30

## 2021-05-29 MED ORDER — TETANUS-DIPHTH-ACELL PERTUSSIS 5-2.5-18.5 LF-MCG/0.5 IM SUSY
0.5000 mL | PREFILLED_SYRINGE | Freq: Once | INTRAMUSCULAR | Status: AC
  Administered 2021-05-29: 0.5 mL via INTRAMUSCULAR

## 2021-05-29 MED ORDER — OXYCODONE-ACETAMINOPHEN 5-325 MG PO TABS: 1.0000 | ORAL_TABLET | Freq: Four times a day (QID) | ORAL | 0 refills | Status: AC | PRN

## 2021-05-29 MED ORDER — HYDROMORPHONE HCL 1 MG/ML IJ SOLN
0.5000 mg | Freq: Once | INTRAMUSCULAR | Status: AC
Start: 2021-05-29 — End: 2021-05-29
  Administered 2021-05-29: 0.5 mg via INTRAVENOUS
  Filled 2021-05-29: qty 1

## 2021-05-29 MED ORDER — ONDANSETRON HCL 4 MG/2ML IJ SOLN
4.0000 mg | Freq: Once | INTRAMUSCULAR | Status: AC
  Administered 2021-05-29: 4 mg via INTRAVENOUS
  Filled 2021-05-29: qty 2

## 2021-05-29 MED ORDER — HYDROMORPHONE HCL 1 MG/ML IJ SOLN
0.5000 mg | Freq: Once | INTRAMUSCULAR | Status: AC
  Administered 2021-05-29: 0.5 mg via INTRAVENOUS
  Filled 2021-05-29: qty 1

## 2021-05-29 NOTE — ED Notes (Signed)
I stat done but failed to cross over. Dr received printed version of answers.

## 2021-05-29 NOTE — ED Notes (Signed)
Blood cleaned off pt and lacerations cleaned.

## 2021-05-29 NOTE — ED Provider Notes (Signed)
[facility] EMERGENCY DEPARTMENT Provider Note   CSN: [id number] Arrival date & time: 05/29/21  1523     History Chief Complaint  Patient presents with   Head Injury    [name] is a 55 y.o. male.  Patient states he was hit in the head with a branch.  No loss of consciousness.  Patient does complain of laceration to the left ear  The history is provided by the patient and medical records. No language interpreter was used.  Head Injury Head/neck injury location: Left lateral head. Mechanism of injury comment:  Tree branch fell on head Pain details:    Quality:  Aching   Severity:  Moderate   Timing:  Constant   Progression:  Waxing and waning Chronicity:  New Relieved by:  Nothing Worsened by:  Nothing Ineffective treatments:  None tried Associated symptoms: no blurred vision, no headaches and no seizures       Past Medical History:  Diagnosis Date   Anxiety    Borderline diabetes    Bulging disc    Chronic back pain    Closed intra-articular fracture of distal end of left tibia    Diabetes mellitus without complication (HCC)    Hypercholesteremia    Myocardial infarction [facility])    Pancreatitis     Patient Active Problem List   Diagnosis Date Noted   Essential hypertension, benign 04/14/2021   Upper abdominal pain 02/05/2020   Abnormal weight loss 02/05/2020   Constipation 02/05/2020   Hypertriglyceridemia 12/06/2019   Fracture of tibial shaft, left, closed 10/02/2018   Current smoker 10/02/2018   Closed intra-articular fracture of distal end of left tibia    Type 2 diabetes mellitus with vascular disease ([place])    Pre-op chest exam    Closed fracture of left distal fibula 09/29/2018   Angina, class II ([place]) - Low Risk ST, but persistent chest pain. 05/08/2017   History of colonic polyps    Diverticulosis of colon without hemorrhage    ST-segment elevation myocardial infarction (STEMI) of inferior wall ([place]) 03/14/2014   Presence of bare metal stent  in right coronary artery: VeriFlex BMS 5.0 mm x 15 mm (5.6 mm) 03/14/2014    Class: Status post   CAD S/P percutaneous coronary angioplasty 03/14/2014   Chest pain 03/13/2014   Pancreatitis 01/19/2014   Mixed hyperlipidemia 01/19/2014   Diabetes mellitus ([place]) 01/19/2014    Past Surgical History:  Procedure Laterality Date   ABDOMINAL SURGERY  1990   adhesions, blockage.    APPENDECTOMY     BACK SURGERY     CARDIAC CATHETERIZATION  2003   "trivial coronary artery disease," normal EF   COLONOSCOPY N/A 04/16/2015   Procedure: COLONOSCOPY;  Surgeon: Daneil Dolin, MD;  Location[facility] ENDO [facility];  Service: Endoscopy;  Laterality: N/A;  12:45 PM   COLONOSCOPY WITH PROPOFOL N/A 06/28/2018   [place]: Diverticulosis, 2 tubular adenomas removed.  Next colonoscopy in 5 years.   LEFT HEART CATH AND CORONARY ANGIOGRAPHY N/A 05/09/2017   Procedure: LEFT HEART CATH AND CORONARY ANGIOGRAPHY;  Surgeon: Leonie Man, MD;  Location: [place] CV LAB;  Service: Cardiovascular;  Laterality: N/A;   LEFT HEART CATHETERIZATION WITH CORONARY ANGIOGRAM N/A 03/13/2014   Procedure: LEFT HEART CATHETERIZATION WITH CORONARY ANGIOGRAM;  Surgeon: Leonie Man, MD;  Location: [facility] CATH LAB;  Service: Cardiovascular;  Laterality: N/A;   OPEN REDUCTION INTERNAL FIXATION (ORIF) TIBIA/FIBULA FRACTURE Left 10/01/2018   Procedure: OPEN REDUCTION INTERNAL FIXATION LEFT PILON FRACTURE;  Surgeon: Altamese Moccasin, MD;  Location: [place];  Service: Orthopedics;  Laterality: Left;   POLYPECTOMY  06/28/2018   Procedure: POLYPECTOMY;  Surgeon: Daneil Dolin, MD;  Location: [facility];  Service: Endoscopy;;  (colon)       Family History  Problem Relation Age of Onset   Diabetes Father    Hyperlipidemia Father    Hypertension Father    Hypertension Mother    Hyperlipidemia Mother    Hyperlipidemia Sister    Hypertension Sister    Diabetes Sister     Social History   Tobacco Use   Smoking status: Every Day     Packs/day: 0.50    Years: 30.00    Pack years: 15.00    Types: Cigarettes    Start date: 10/25/1977   Smokeless tobacco: Never  Vaping Use   Vaping Use: Never used  Substance Use Topics   Alcohol use: Yes    Alcohol/week: 6.0 standard drinks    Types: 6 Cans of beer per week   Drug use: No    Home Medications Prior to Admission medications   Medication Sig Start Date End Date Taking? Authorizing Provider  oxyCODONE-acetaminophen (PERCOCET) 5-325 MG tablet Take 1 tablet by mouth every 6 (six) hours as needed. 05/29/21  Yes Milton Ferguson, MD  Albuterol Sulfate (PROAIR HFA IN) Inhale 2 puffs into the lungs as needed.    [provider]  aspirin 81 MG chewable tablet Chew 1 tablet (81 mg total) by mouth daily. 03/15/14   Eileen Stanford, PA-C  atorvastatin (LIPITOR) 40 MG tablet Take 40 mg by mouth at bedtime. 03/06/20   [provider]  Cholecalciferol 50 MCG (2000 UT) CAPS Take 1 capsule (2,000 Units total) by mouth daily with breakfast. 04/28/21   Nida, Marella Chimes, MD  CRANBERRY PO Take 1 tablet by mouth every 14 (fourteen) days.    [provider]  furosemide (LASIX) 20 MG tablet Take 1 tablet (20 mg total) by mouth daily as needed. 03/19/20   Verta Ellen., NP  ibuprofen (ADVIL,MOTRIN) 200 MG tablet Take 800 mg by mouth every 6 (six) hours as needed for fever, headache or mild pain.     [provider]  Insulin Pen Needle (B-D ULTRAFINE III SHORT PEN) 31G X 8 MM MISC 1 each by Does not apply route as directed. 04/14/21   Cassandria Anger, MD  isosorbide mononitrate (IMDUR) 60 MG 24 hr tablet Take 60 mg daily 05/25/21   Arnoldo Lenis, MD  LANTUS SOLOSTAR 100 UNIT/ML Solostar Pen Inject 40 Units into the skin at bedtime. 04/14/21   Cassandria Anger, MD  LINZESS 145 MCG CAPS capsule TAKE 1 CAPSULE(145 MCG) BY MOUTH DAILY BEFORE BREAKFAST 07/01/20   Mahala Menghini, PA-C  lisinopril (ZESTRIL) 2.5 MG tablet TAKE 1 TABLET(2.5 MG) BY  MOUTH DAILY 12/08/20   Arnoldo Lenis, MD  metFORMIN (GLUCOPHAGE) 1000 MG tablet Take 1,000 mg by mouth 2 (two) times daily. 03/28/19   [provider]  Multiple Vitamin (MULTIVITAMIN) tablet Take 1 tablet by mouth daily.    [provider]  nitroGLYCERIN (NITROSTAT) 0.4 MG SL tablet Place 1 tablet (0.4 mg total) under the tongue every 5 (five) minutes x 3 doses as needed for chest pain (if no relief after 3rd dose, proceed to the ED for an evaluation or call 911). 03/19/20   Verta Ellen., NP  Omega-3 Fatty Acids (SALMON OIL-1000 PO) Take 1 capsule by mouth  daily.    [provider]  phenazopyridine (PYRIDIUM) 200 MG tablet Take 1 tablet (200 mg total) by mouth 3 (three) times daily as needed for pain. Patient not taking: Reported on 04/14/2021 02/27/21   Margette Fast, MD  triamcinolone ointment (KENALOG) 0.1 % Apply 1 application topically 2 (two) times daily. Patient not taking: Reported on 04/14/2021    [provider]  zolpidem (AMBIEN) 10 MG tablet Take 10 mg by mouth at bedtime as needed. 07/28/19   [provider]    Allergies    Patient has no known allergies.  Review of Systems   Review of Systems  Constitutional:  Negative for appetite change and fatigue.  HENT:  Negative for congestion, ear discharge and sinus pressure.        Headache  Eyes:  Negative for blurred vision and discharge.  Respiratory:  Negative for cough.   Cardiovascular:  Negative for chest pain.  Gastrointestinal:  Negative for abdominal pain and diarrhea.  Genitourinary:  Negative for frequency and hematuria.  Musculoskeletal:  Negative for back pain.  Skin:  Negative for rash.  Neurological:  Negative for seizures and headaches.  Psychiatric/Behavioral:  Negative for hallucinations.    Physical Exam Updated Vital Signs BP 112/61   Pulse 65   Temp 98.1 F (36.7 C) (Oral)   Resp 17   Ht 6\' 1"  (1.854 m)   Wt 98.9 kg   SpO2 95%   BMI 28.76 kg/m    Physical Exam Vitals and nursing note reviewed.  Constitutional:      Appearance: He is well-developed.  HENT:     Head:     Comments: 5 cm laceration to left ear and behind left ear    Nose: Nose normal.  Eyes:     General: No scleral icterus.    Conjunctiva/sclera: Conjunctivae normal.  Neck:     Thyroid: No thyromegaly.  Cardiovascular:     Rate and Rhythm: Normal rate and regular rhythm.     Heart sounds: No murmur heard.   No friction rub. No gallop.  Pulmonary:     Breath sounds: No stridor. No wheezing or rales.  Chest:     Chest wall: No tenderness.  Abdominal:     General: There is no distension.     Tenderness: There is no abdominal tenderness. There is no rebound.  Musculoskeletal:        General: Normal range of motion.     Cervical back: Neck supple.  Lymphadenopathy:     Cervical: No cervical adenopathy.  Skin:    Findings: No erythema or rash.  Neurological:     Mental Status: He is alert and oriented to person, place, and time.     Motor: No abnormal muscle tone.     Coordination: Coordination normal.  Psychiatric:        Behavior: Behavior normal.    ED Results / Procedures / Treatments   Labs (all labs ordered are listed, but only abnormal results are displayed) Labs Reviewed  CBC WITH DIFFERENTIAL/PLATELET - Abnormal; Notable for the following components:      Result Value   WBC 13.4 (*)    Neutro Abs 9.9 (*)    All other components within normal limits  COMPREHENSIVE METABOLIC PANEL - Abnormal; Notable for the following components:   Glucose, Bld 137 (*)    All other components within normal limits  I-STAT CHEM 8, ED    EKG None  Radiology CT Head Wo Contrast  Result Date: 05/29/2021 CLINICAL DATA:  Status post fall. Lower back pain, abrasion to left side of head and laceration/wound to left hear (small part of upper ear lobe missing). Pt had a tree Fall onto his head when cutting it down. EXAM: CT HEAD WITHOUT CONTRAST CT CERVICAL  SPINE WITHOUT CONTRAST TECHNIQUE: Multidetector CT imaging of the head and cervical spine was performed following the standard protocol without intravenous contrast. Multiplanar CT image reconstructions of the cervical spine were also generated. COMPARISON:  None. FINDINGS: CT HEAD FINDINGS Brain: No evidence of large-territorial acute infarction. No parenchymal hemorrhage. No mass lesion. No extra-axial collection. No mass effect or midline shift. No hydrocephalus. Basilar cisterns are patent. Vascular: No hyperdense vessel. Skull: No acute fracture or focal lesion. Sinuses/Orbits: Paranasal sinuses and mastoid air cells are clear. The orbits are unremarkable. Other: There is an acute 7 mm left temporal subcutaneus soft tissue hematoma formation just superficial to the temporalis muscle. CT CERVICAL SPINE FINDINGS Alignment: Normal. Skull base and vertebrae: Degenerative changes at the C6-C7 level. Associated severe osseous neural foraminal stenosis bilaterally. No severe osseous central canal stenosis. No acute fracture. No aggressive appearing focal osseous lesion or focal pathologic process. Soft tissues and spinal canal: No prevertebral fluid or swelling. No visible canal hematoma. Upper chest: Unremarkable. Other: None. IMPRESSION: 1. Acute 7 mm left temporal subcutaneus soft tissue hematoma just superficial to the temporalis muscle. 2.  No acute intracranial abnormality. 3. No acute displaced fracture or traumatic listhesis of the cervical spine. 4. C6-C7 degenerative changes with associated severe osseous neural foraminal stenosis bilaterally. Electronically Signed   By: Iven Finn M.D.   On: 05/29/2021 18:00   CT Cervical Spine Wo Contrast  Result Date: 05/29/2021 CLINICAL DATA:  Status post fall. Lower back pain, abrasion to left side of head and laceration/wound to left hear (small part of upper ear lobe missing). Pt had a tree Fall onto his head when cutting it down. EXAM: CT HEAD WITHOUT  CONTRAST CT CERVICAL SPINE WITHOUT CONTRAST TECHNIQUE: Multidetector CT imaging of the head and cervical spine was performed following the standard protocol without intravenous contrast. Multiplanar CT image reconstructions of the cervical spine were also generated. COMPARISON:  None. FINDINGS: CT HEAD FINDINGS Brain: No evidence of large-territorial acute infarction. No parenchymal hemorrhage. No mass lesion. No extra-axial collection. No mass effect or midline shift. No hydrocephalus. Basilar cisterns are patent. Vascular: No hyperdense vessel. Skull: No acute fracture or focal lesion. Sinuses/Orbits: Paranasal sinuses and mastoid air cells are clear. The orbits are unremarkable. Other: There is an acute 7 mm left temporal subcutaneus soft tissue hematoma formation just superficial to the temporalis muscle. CT CERVICAL SPINE FINDINGS Alignment: Normal. Skull base and vertebrae: Degenerative changes at the C6-C7 level. Associated severe osseous neural foraminal stenosis bilaterally. No severe osseous central canal stenosis. No acute fracture. No aggressive appearing focal osseous lesion or focal pathologic process. Soft tissues and spinal canal: No prevertebral fluid or swelling. No visible canal hematoma. Upper chest: Unremarkable. Other: None. IMPRESSION: 1. Acute 7 mm left temporal subcutaneus soft tissue hematoma just superficial to the temporalis muscle. 2.  No acute intracranial abnormality. 3. No acute displaced fracture or traumatic listhesis of the cervical spine. 4. C6-C7 degenerative changes with associated severe osseous neural foraminal stenosis bilaterally. Electronically Signed   By: Iven Finn M.D.   On: 05/29/2021 18:00   CT ABDOMEN PELVIS W CONTRAST  Result Date: 05/29/2021 CLINICAL DATA:  Abdominal trauma.  Fall from tree EXAM: CT ABDOMEN AND  PELVIS WITH CONTRAST TECHNIQUE: Multidetector CT imaging of the abdomen and pelvis was performed using the standard protocol following bolus  administration of intravenous contrast. CONTRAST:  159mL OMNIPAQUE IOHEXOL 300 MG/ML  SOLN COMPARISON:  CT abdomen pelvis 02/06/2020 FINDINGS: Lower visualized hemithorax: Right lower lobe subcutaneus soft tissue atelectasis. Otherwise no acute abnormality. Liver: Not enlarged. No focal lesion. No laceration or subcapsular hematoma. Biliary System: The gallbladder is otherwise unremarkable with no radio-opaque gallstones. No biliary ductal dilatation. Pancreas: Normal pancreatic contour. No main pancreatic duct dilatation. Spleen: Not enlarged. No focal lesion. No laceration, subcapsular hematoma, or vascular injury. Adrenal Glands: No nodularity bilaterally. Kidneys: Bilateral kidneys enhance symmetrically. No hydronephrosis. No contusion, laceration, or subcapsular hematoma. No injury to the vascular structures or collecting systems. No hydroureter. Subcentimeter hypodensities too small to characterize. The urinary bladder is unremarkable. On delayed imaging, there is no urothelial wall thickening and there are no filling defects in the opacified portions of the bilateral collecting systems or ureters. Bowel: No small or large bowel wall thickening or dilatation. Diffuse sigmoid diverticulosis. Status post appendectomy. Omentum, and Peritoneum: No simple free fluid ascites. No pneumoperitoneum. No hemoperitoneum. No mesenteric hematoma identified. No organized fluid collection. Pelvic Organs: The prostate is unremarkable. Lymph Nodes: No abdominal, pelvic, inguinal lymphadenopathy. Vasculature: No abdominal aorta or iliac aneurysm. Mild atherosclerotic plaque. No active contrast extravasation or pseudoaneurysm. Musculoskeletal: No significant soft tissue hematoma. Multilevel degenerative changes of the spine. No acute pelvic fracture. No spinal fracture. Old healed left L1 transverse process fracture. IMPRESSION: 1.  No acute traumatic injury to the abdomen, or pelvis. 2. No acute fracture or traumatic  malalignment of the lumbar spine. 3. Other imaging findings of potential clinical significance: Colonic diverticulosis with no acute diverticulitis. 4.  Aortic Atherosclerosis (ICD10-I70.0). Electronically Signed   By: Iven Finn M.D.   On: 05/29/2021 18:05    Procedures Procedures   Medications Ordered in ED Medications  HYDROmorphone (DILAUDID) injection 0.5 mg (0.5 mg Intravenous Given 05/29/21 1643)  ondansetron (ZOFRAN) injection 4 mg (4 mg Intravenous Given 05/29/21 1642)  iohexol (OMNIPAQUE) 300 MG/ML solution 100 mL (100 mLs Intravenous Contrast Given 05/29/21 1726)  lidocaine (PF) (XYLOCAINE) 1 % injection 10 mL (10 mLs Infiltration Given 05/29/21 1854)  HYDROmorphone (DILAUDID) injection 0.5 mg (0.5 mg Intravenous Given 05/29/21 1815)    ED Course  I have reviewed the triage vital signs and the nursing notes.  Pertinent labs & imaging results that were available during my care of the patient were reviewed by me and considered in my medical decision making (see chart for details).    MDM Rules/Calculators/A&P                           Patient with contusion to head laceration to left ear and contusion to lower back.  CT scan is negative.  He will follow-up with PCP.  Patient with a 5 cm laceration that was sutured by my PA Hildred Alamin stage Final Clinical Impression(s) / ED Diagnoses Final diagnoses:  Injury of head, initial encounter    Rx / DC Orders ED Discharge Orders          Ordered    oxyCODONE-acetaminophen (PERCOCET) 5-325 MG tablet  Every 6 hours PRN        05/29/21 1859             Milton Ferguson, MD 05/31/21 1046

## 2021-05-29 NOTE — Discharge Instructions (Addendum)
Clean laceration gently with soap and water twice a day and follow-up with your doctor next week for recheck

## 2021-05-29 NOTE — ED Provider Notes (Addendum)
LACERATION REPAIR Performed by: Lahoma Crocker Authorized by: Lahoma Crocker Consent: Verbal consent obtained. Risks and benefits: risks, benefits and alternatives were discussed Consent given by: patient Patient identity confirmed: provided demographic data Prepped and Draped in normal sterile fashion Wound explored  Laceration Location: left ear  Laceration Length: 5 cm  No Foreign Bodies seen or palpated  Anesthesia: local infiltration  Local anesthetic: lidocaine % 0 epinephrine  Anesthetic total: 5 ml  Irrigation method: syringe Amount of cleaning: standard  Skin closure: close  Number of sutures: 18 - chromic gut, 6.0  Technique: simple interrupted   Patient tolerance: Patient tolerated the procedure well with no immediate complications.     Sherrill Raring, PA-C 05/29/21 1851    Sherrill Raring, PA-C 05/29/21 1906    Milton Ferguson, MD 05/31/21 1047

## 2021-05-29 NOTE — ED Triage Notes (Addendum)
Patient brought in via EMS. Alert and oriented. Airway patent. Patient had large oak tree fall onto his head when cutting it down. Denies LOC, dizziness, blurred vision, or headache. Patient has abrasion to left side of head and laceration/wound to left hear (small part of upper ear lobe missing). Bleeding controled at this time. Patient c/o right lower back pain. Patient ambulated to stretcher. CNS intact. Patient has been to bathroom since injury per paramedic. Denies any complications or incontinency.  Per patient prior hx of back pain.

## 2021-05-31 LAB — I-STAT CHEM 8, ED
BUN: 19 mg/dL (ref 6–20)
Calcium, Ion: 1.18 mmol/L (ref 1.15–1.40)
Chloride: 102 mmol/L (ref 98–111)
Creatinine, Ser: 0.9 mg/dL (ref 0.61–1.24)
Glucose, Bld: 134 mg/dL — ABNORMAL HIGH (ref 70–99)
HCT: 47 % (ref 39.0–52.0)
Hemoglobin: 16 g/dL (ref 13.0–17.0)
Potassium: 3.6 mmol/L (ref 3.5–5.1)
Sodium: 141 mmol/L (ref 135–145)
TCO2: 26 mmol/L (ref 22–32)

## 2021-06-02 DIAGNOSIS — G894 Chronic pain syndrome: Secondary | ICD-10-CM | POA: Diagnosis not present

## 2021-06-02 DIAGNOSIS — S0990XA Unspecified injury of head, initial encounter: Secondary | ICD-10-CM | POA: Diagnosis not present

## 2021-06-07 ENCOUNTER — Other Ambulatory Visit: Payer: Self-pay | Admitting: Family Medicine

## 2021-06-08 ENCOUNTER — Other Ambulatory Visit: Payer: Self-pay

## 2021-06-08 ENCOUNTER — Encounter: Payer: Self-pay | Admitting: Internal Medicine

## 2021-06-08 ENCOUNTER — Ambulatory Visit (INDEPENDENT_AMBULATORY_CARE_PROVIDER_SITE_OTHER): Payer: Medicare Other | Admitting: Internal Medicine

## 2021-06-08 ENCOUNTER — Telehealth: Payer: Self-pay | Admitting: Cardiology

## 2021-06-08 VITALS — BP 95/65 | HR 94 | Temp 97.5°F | Ht 72.0 in | Wt 215.0 lb

## 2021-06-08 DIAGNOSIS — K5900 Constipation, unspecified: Secondary | ICD-10-CM | POA: Diagnosis not present

## 2021-06-08 DIAGNOSIS — R109 Unspecified abdominal pain: Secondary | ICD-10-CM

## 2021-06-08 MED ORDER — FUROSEMIDE 20 MG PO TABS: 20.0000 mg | ORAL_TABLET | Freq: Every day | ORAL | 1 refills | Status: DC | PRN

## 2021-06-08 NOTE — Progress Notes (Signed)
Primary Care Physician:  Sharilyn Sites, MD Primary Gastroenterologist:  Dr. Gala Romney  Pre-Procedure History & Physical: HPI:  [name] is a 55 y.o. male here for here for further evaluation of waxing and waning right-sided abdominal pain.  History of chronic constipation.  Over the past year or so ,historically, Linzess 145 has worked in combating his constipation.  However, he takes it sporadically does not take it daily.  He has a tendency towards diarrhea if he takes it every day.  He waits until he is gone 2 to 3 days without a bowel movement then takes it.  He recently ran out.  He has had right-sided abdominal pain and abdominal bloating in association with lack of bowel movement of the past 3 days as an example.  He has not had any bleeding.  Colonoscopy 2019 demonstrated a couple of adenomas for which she is due for surveillance examination 2024.  He suffered blunt force trauma when he was cutting down a tree about 10 days ago.  Seen in the ED.  Fortunately, he had no serious intra-abdominal injuries as assessed by CT scan.  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  He has not consumed alcohol or used illicit drugs in over a year now.  No fever chills, nausea or vomiting.  Only occasional reflux symptoms.  No dysphagia.  Gallbladder remains in situ; appendectomy some 20 years ago.  Past Medical History:  Diagnosis Date   Anxiety    Borderline diabetes    Bulging disc    Chronic back pain    Closed intra-articular fracture of distal end of left tibia    Diabetes mellitus without complication (HCC)    Hypercholesteremia    Myocardial infarction ([place])    Pancreatitis     Past Surgical History:  Procedure Laterality Date   ABDOMINAL SURGERY  1990   adhesions, blockage.    APPENDECTOMY     BACK SURGERY     CARDIAC CATHETERIZATION  2003   "trivial coronary artery disease," normal EF   COLONOSCOPY N/A 04/16/2015   Procedure: COLONOSCOPY;  Surgeon:  Daneil Dolin, MD;  Location: [facility];  Service: Endoscopy;  Laterality: N/A;  12:45 PM   COLONOSCOPY WITH PROPOFOL N/A 06/28/2018   Brandi Armato: Diverticulosis, 2 tubular adenomas removed.  Next colonoscopy in 5 years.   LEFT HEART CATH AND CORONARY ANGIOGRAPHY N/A 05/09/2017   Procedure: LEFT HEART CATH AND CORONARY ANGIOGRAPHY;  Surgeon: Leonie Man, MD;  Location: [facility];  Service: Cardiovascular;  Laterality: N/A;   LEFT HEART CATHETERIZATION WITH CORONARY ANGIOGRAM N/A 03/13/2014   Procedure: LEFT HEART CATHETERIZATION WITH CORONARY ANGIOGRAM;  Surgeon: Leonie Man, MD;  Location: [facility];  Service: Cardiovascular;  Laterality: N/A;   OPEN REDUCTION INTERNAL FIXATION (ORIF) TIBIA/FIBULA FRACTURE Left 10/01/2018   Procedure: OPEN REDUCTION INTERNAL FIXATION LEFT PILON FRACTURE;  Surgeon: Altamese Monterey, MD;  Location: [facility];  Service: Orthopedics;  Laterality: Left;   POLYPECTOMY  06/28/2018   Procedure: POLYPECTOMY;  Surgeon: Daneil Dolin, MD;  Location: [facility];  Service: Endoscopy;;  (colon)    Prior to Admission medications   Medication Sig Start Date End Date Taking? Authorizing Provider  Albuterol Sulfate (PROAIR HFA IN) Inhale 2 puffs into the lungs as needed.   Yes [provider]  aspirin 81 MG chewable tablet Chew 1 tablet (81 mg total) by mouth daily. 03/15/14  Yes Eileen Stanford, PA-C  atorvastatin (  LIPITOR) 40 MG tablet Take 40 mg by mouth at bedtime. 03/06/20  Yes [provider]  CRANBERRY PO Take 1 tablet by mouth every 14 (fourteen) days.   Yes [provider]  furosemide (LASIX) 20 MG tablet Take 1 tablet (20 mg total) by mouth daily as needed. 06/08/21  Yes Branch, Alphonse Guild, MD  ibuprofen (ADVIL,MOTRIN) 200 MG tablet Take 800 mg by mouth every 6 (six) hours as needed for fever, headache or mild pain.    Yes [provider]  Insulin Pen Needle (B-D ULTRAFINE III SHORT PEN) 31G X 8 MM MISC 1 each by  Does not apply route as directed. 04/14/21  Yes Cassandria Anger, MD  isosorbide mononitrate (IMDUR) 60 MG 24 hr tablet Take 60 mg daily 05/25/21  Yes Branch, Alphonse Guild, MD  LANTUS SOLOSTAR 100 UNIT/ML Solostar Pen Inject 40 Units into the skin at bedtime. 04/14/21  Yes Cassandria Anger, MD  LINZESS 145 MCG CAPS capsule TAKE 1 CAPSULE(145 MCG) BY MOUTH DAILY BEFORE BREAKFAST 07/01/20  Yes Mahala Menghini, PA-C  lisinopril (ZESTRIL) 2.5 MG tablet TAKE 1 TABLET(2.5 MG) BY MOUTH DAILY 12/08/20  Yes Branch, Alphonse Guild, MD  metFORMIN (GLUCOPHAGE) 1000 MG tablet Take 1,000 mg by mouth 2 (two) times daily. 03/28/19  Yes [provider]  Multiple Vitamin (MULTIVITAMIN) tablet Take 1 tablet by mouth daily.   Yes [provider]  nitroGLYCERIN (NITROSTAT) 0.4 MG SL tablet Place 1 tablet (0.4 mg total) under the tongue every 5 (five) minutes x 3 doses as needed for chest pain (if no relief after 3rd dose, proceed to the ED for an evaluation or call 911). 03/19/20  Yes Verta Ellen., NP  Omega-3 Fatty Acids (SALMON OIL-1000 PO) Take 1 capsule by mouth daily.   Yes [provider]  oxyCODONE-acetaminophen (PERCOCET) 5-325 MG tablet Take 1 tablet by mouth every 6 (six) hours as needed. 05/29/21  Yes Milton Ferguson, MD  zolpidem (AMBIEN) 10 MG tablet Take 10 mg by mouth at bedtime as needed. 07/28/19  Yes [provider]    Allergies as of 06/08/2021   (No Known Allergies)    Family History  Problem Relation Age of Onset   Diabetes Father    Hyperlipidemia Father    Hypertension Father    Hypertension Mother    Hyperlipidemia Mother    Hyperlipidemia Sister    Hypertension Sister    Diabetes Sister     Social History   Socioeconomic History   Marital status: Single    Spouse name: Not on file   Number of children: Not on file   Years of education: Not on file   Highest education level: Not on file  Occupational History   Not on file  Tobacco Use    Smoking status: Every Day    Packs/day: 0.50    Years: 30.00    Pack years: 15.00    Types: Cigarettes    Start date: 10/25/1977   Smokeless tobacco: Never  Vaping Use   Vaping Use: Never used  Substance and Sexual Activity   Alcohol use: Not Currently    Alcohol/week: 6.0 standard drinks    Types: 6 Cans of beer per week    Comment: denied 06/08/21   Drug use: No   Sexual activity: Yes  Other Topics Concern   Not on file  Social History Narrative   Not on file   Social Determinants of Health   Financial Resource Strain: Not on  file  Food Insecurity: Not on file  Transportation Needs: Not on file  Physical Activity: Not on file  Stress: Not on file  Social Connections: Not on file  Intimate Partner Violence: Not on file    Review of Systems: See HPI, otherwise negative ROS  Physical Exam: BP 95/65   Pulse 94   Temp (!) 97.5 F (36.4 C) (Temporal)   Ht 6' (1.829 m)   Wt 215 lb (97.5 kg)   BMI 29.16 kg/m  General:   Slightly disheveled.   pleasant and cooperative in NAD Neck:  Supple; no masses or thyromegaly. No significant cervical adenopathy. Lungs:  Clear throughout to auscultation.   No wheezes, crackles, or rhonchi. No acute distress. Heart:  Regular rate and rhythm; no murmurs, clicks, rubs,  or gallops. Abdomen: Non-distended, normal bowel sounds.  Soft and nontender without appreciable mass or hepatosplenomegaly.  Pulses:  Normal pulses noted. Extremities:  Without clubbing or edema.  Impression/Plan: 55 year old gentleman with a history of polysubstance/alcohol use disorder here for further evaluation of right-sided abdominal pain intermittently open (incidentally predating his recent injury related to cutting down a tree) temporally associated with bowel function or lack thereof. Taking Linzess sporadically.  Waiting until he has gone a few days without a bowel movement before takes it.  This is not the ideal approach.  He needs to take Linzess every  day as an optimal regimen.  It is likely that if he takes this every day he will get diarrhea.  I suspect his his abdominal discomfort is related to constipation.  Clinically, he does not have pancreatitis nor do I think he has any sequelae from his recent injury. It sounds as though he was very fortunate not to have a serious injury from the tree cutting accident. He has a benign abdominal examination today.  He will likely do better with a lower dose of Linzess on a regular basis.    Recommendations:   As discussed, Linzess works best if you take it regularly-ideally every day.  I suspect the Linzess 145 was too strong for every day use..  We need to find the just right dosing for you.  Lets try Linzess 72 (1) capsule daily.  If stools get too loose, drop back to (1) 72 gelcap every other day.  Would avoid taking Linzess sporadically (do not wait to take this medication until constipated).  The idea here is to prevent it from occurring in the first place.  Call me in 3 to 4 days if this regimen does not help office visit in 6 weeks.  Bowel function and abdominal pain.  We will plan to see back in office in 6 weeks  Follow-up colonoscopy due 2024.      Notice: This dictation was prepared with Dragon dictation along with smaller phrase technology. Any transcriptional errors that result from this process are unintentional and may not be corrected upon review.

## 2021-06-08 NOTE — Telephone Encounter (Signed)
Medication refill request approved and sent to [facility] per pt request.

## 2021-06-08 NOTE — Patient Instructions (Signed)
It was good to see you again today!  As discussed, Linzess works best if you take it regularly-ideally every day.  I suspect the Linzess 145 was too strong for every day use..  We need to find the just right dosing for you.  Lets try Linzess 72 (1) capsule daily.  If your stools become too loose on this regimen, drop back to (1) 72 gelcap every other day.  Would avoid taking Linzess sporadically (do not take only after you get constipated).  The idea here is to prevent it from occurring in the first place.  Call me in 3 to 4 days if this regimen does not help your bowel function and abdominal pain.  We will plan to see back in office in 6 weeks  Follow-up colonoscopy due 2024.

## 2021-06-08 NOTE — Telephone Encounter (Signed)
*  STAT* If patient is at the pharmacy, call can be transferred to refill team.   1. Which medications need to be refilled? (please list name of each medication and dose if known) furosemide (LASIX) 20 MG tablet  2. Which pharmacy/location (including street and city if local pharmacy) is medication to be sent to?  [id number] - [place], [place] [address]  3. Do they need a 30 day or 90 day supply? [place]

## 2021-06-10 ENCOUNTER — Telehealth: Payer: Self-pay

## 2021-06-10 ENCOUNTER — Other Ambulatory Visit: Payer: Self-pay

## 2021-06-10 MED ORDER — LINACLOTIDE 72 MCG PO CAPS: 72.0000 ug | ORAL_CAPSULE | Freq: Every day | ORAL | 11 refills | Status: DC

## 2021-06-10 NOTE — Telephone Encounter (Signed)
Pt states that the Linzess 72 mcg is working just fine and would like for a prescription to be called in to [facility] [address]

## 2021-06-10 NOTE — Telephone Encounter (Signed)
Medication was sent in. Pt was made aware.

## 2021-06-23 DIAGNOSIS — G894 Chronic pain syndrome: Secondary | ICD-10-CM | POA: Diagnosis not present

## 2021-06-28 DIAGNOSIS — G8929 Other chronic pain: Secondary | ICD-10-CM | POA: Diagnosis not present

## 2021-06-28 DIAGNOSIS — M75101 Unspecified rotator cuff tear or rupture of right shoulder, not specified as traumatic: Secondary | ICD-10-CM | POA: Diagnosis not present

## 2021-06-28 DIAGNOSIS — M7581 Other shoulder lesions, right shoulder: Secondary | ICD-10-CM | POA: Diagnosis not present

## 2021-06-28 DIAGNOSIS — M75102 Unspecified rotator cuff tear or rupture of left shoulder, not specified as traumatic: Secondary | ICD-10-CM | POA: Diagnosis not present

## 2021-06-28 DIAGNOSIS — M25512 Pain in left shoulder: Secondary | ICD-10-CM | POA: Diagnosis not present

## 2021-07-09 DIAGNOSIS — M25511 Pain in right shoulder: Secondary | ICD-10-CM | POA: Diagnosis not present

## 2021-07-09 DIAGNOSIS — M7581 Other shoulder lesions, right shoulder: Secondary | ICD-10-CM | POA: Diagnosis not present

## 2021-07-09 DIAGNOSIS — M75101 Unspecified rotator cuff tear or rupture of right shoulder, not specified as traumatic: Secondary | ICD-10-CM | POA: Diagnosis not present

## 2021-07-09 DIAGNOSIS — G8929 Other chronic pain: Secondary | ICD-10-CM | POA: Diagnosis not present

## 2021-07-13 ENCOUNTER — Encounter: Payer: Self-pay | Admitting: Internal Medicine

## 2021-07-15 DIAGNOSIS — G8929 Other chronic pain: Secondary | ICD-10-CM | POA: Diagnosis not present

## 2021-07-15 DIAGNOSIS — M7581 Other shoulder lesions, right shoulder: Secondary | ICD-10-CM | POA: Diagnosis not present

## 2021-07-15 DIAGNOSIS — M25511 Pain in right shoulder: Secondary | ICD-10-CM | POA: Diagnosis not present

## 2021-07-15 DIAGNOSIS — M75101 Unspecified rotator cuff tear or rupture of right shoulder, not specified as traumatic: Secondary | ICD-10-CM | POA: Diagnosis not present

## 2021-07-26 DIAGNOSIS — E1159 Type 2 diabetes mellitus with other circulatory complications: Secondary | ICD-10-CM | POA: Diagnosis not present

## 2021-07-26 DIAGNOSIS — G4700 Insomnia, unspecified: Secondary | ICD-10-CM | POA: Diagnosis not present

## 2021-07-26 DIAGNOSIS — E782 Mixed hyperlipidemia: Secondary | ICD-10-CM | POA: Diagnosis not present

## 2021-07-26 DIAGNOSIS — Z0000 Encounter for general adult medical examination without abnormal findings: Secondary | ICD-10-CM | POA: Diagnosis not present

## 2021-07-26 DIAGNOSIS — S0990XA Unspecified injury of head, initial encounter: Secondary | ICD-10-CM | POA: Diagnosis not present

## 2021-07-26 DIAGNOSIS — E1165 Type 2 diabetes mellitus with hyperglycemia: Secondary | ICD-10-CM | POA: Diagnosis not present

## 2021-07-27 ENCOUNTER — Encounter: Payer: Self-pay | Admitting: Internal Medicine

## 2021-07-27 ENCOUNTER — Ambulatory Visit: Payer: Medicare Other | Admitting: Internal Medicine

## 2021-07-27 LAB — COMPREHENSIVE METABOLIC PANEL
ALT: 22 IU/L (ref 0–44)
AST: 20 IU/L (ref 0–40)
Albumin/Globulin Ratio: 1.8 (ref 1.2–2.2)
Albumin: 4.4 g/dL (ref 3.8–4.9)
Alkaline Phosphatase: 93 IU/L (ref 44–121)
BUN/Creatinine Ratio: 13 (ref 9–20)
BUN: 12 mg/dL (ref 6–24)
Bilirubin Total: 0.3 mg/dL (ref 0.0–1.2)
CO2: 26 mmol/L (ref 20–29)
Calcium: 10 mg/dL (ref 8.7–10.2)
Chloride: 102 mmol/L (ref 96–106)
Creatinine, Ser: 0.96 mg/dL (ref 0.76–1.27)
Globulin, Total: 2.4 g/dL (ref 1.5–4.5)
Glucose: 115 mg/dL — ABNORMAL HIGH (ref 70–99)
Potassium: 3.9 mmol/L (ref 3.5–5.2)
Sodium: 143 mmol/L (ref 134–144)
Total Protein: 6.8 g/dL (ref 6.0–8.5)
eGFR: 93 mL/min/{1.73_m2} (ref >59)

## 2021-07-27 LAB — LIPID PANEL
Chol/HDL Ratio: 4.8 ratio (ref 0.0–5.0)
Cholesterol, Total: 189 mg/dL (ref 100–199)
HDL: 39 mg/dL — ABNORMAL LOW (ref >39)
LDL Chol Calc (NIH): 111 mg/dL — ABNORMAL HIGH (ref 0–99)
Triglycerides: 224 mg/dL — ABNORMAL HIGH (ref 0–149)
VLDL Cholesterol Cal: 39 mg/dL (ref 5–40)

## 2021-07-27 LAB — TSH: TSH: 1.12 u[IU]/mL (ref 0.450–4.500)

## 2021-07-27 LAB — T4, FREE: Free T4: 1.37 ng/dL (ref 0.82–1.77)

## 2021-07-30 ENCOUNTER — Encounter: Payer: Self-pay | Admitting: "Endocrinology

## 2021-07-30 ENCOUNTER — Ambulatory Visit (INDEPENDENT_AMBULATORY_CARE_PROVIDER_SITE_OTHER): Payer: Medicare Other | Admitting: "Endocrinology

## 2021-07-30 ENCOUNTER — Other Ambulatory Visit: Payer: Self-pay

## 2021-07-30 VITALS — BP 102/70 | HR 60 | Ht 72.0 in | Wt 214.8 lb

## 2021-07-30 DIAGNOSIS — E1159 Type 2 diabetes mellitus with other circulatory complications: Secondary | ICD-10-CM | POA: Diagnosis not present

## 2021-07-30 DIAGNOSIS — I10 Essential (primary) hypertension: Secondary | ICD-10-CM

## 2021-07-30 DIAGNOSIS — F17200 Nicotine dependence, unspecified, uncomplicated: Secondary | ICD-10-CM | POA: Diagnosis not present

## 2021-07-30 DIAGNOSIS — E782 Mixed hyperlipidemia: Secondary | ICD-10-CM

## 2021-07-30 LAB — POCT GLYCOSYLATED HEMOGLOBIN (HGB A1C): HbA1c, POC (controlled diabetic range): 7 % (ref 0.0–7.0)

## 2021-07-30 MED ORDER — LANTUS SOLOSTAR 100 UNIT/ML ~~LOC~~ SOPN: 40.0000 [IU] | PEN_INJECTOR | Freq: Every day | SUBCUTANEOUS | 1 refills | Status: DC

## 2021-07-30 MED ORDER — INSULIN PEN NEEDLE 31G X 5 MM MISC: 1 refills | Status: DC

## 2021-07-30 NOTE — Progress Notes (Signed)
07/30/2021, 12:24 PM  Endocrinology follow-up note   Subjective:    Patient ID: [name], male    DOB: [birth year].  [name] is being seen in follow-up after he was seen in consultation for management of currently uncontrolled symptomatic diabetes requested by  Sharilyn Sites, MD.   Past Medical History:  Diagnosis Date   Anxiety    Borderline diabetes    Bulging disc    Chronic back pain    Closed intra-articular fracture of distal end of left tibia    Diabetes mellitus without complication ([place])    Hypercholesteremia    Myocardial infarction [facility])    Pancreatitis     Past Surgical History:  Procedure Laterality Date   ABDOMINAL SURGERY  1990   adhesions, blockage.    APPENDECTOMY     BACK SURGERY     CARDIAC CATHETERIZATION  2003   "trivial coronary artery disease," normal EF   COLONOSCOPY N/A 04/16/2015   Procedure: COLONOSCOPY;  Surgeon: Daneil Dolin, MD;  [facility];  Service: Endoscopy;  Laterality: N/A;  12:45 PM   COLONOSCOPY WITH PROPOFOL N/A 06/28/2018   [place]: Diverticulosis, 2 tubular adenomas removed.  Next colonoscopy in 5 years.   LEFT HEART CATH AND CORONARY ANGIOGRAPHY N/A 05/09/2017   Procedure: LEFT HEART CATH AND CORONARY ANGIOGRAPHY;  Surgeon: Leonie Man, MD;  Location: [place] [facility];  Service: Cardiovascular;  Laterality: N/A;   LEFT HEART CATHETERIZATION WITH CORONARY ANGIOGRAM N/A 03/13/2014   Procedure: LEFT HEART CATHETERIZATION WITH CORONARY ANGIOGRAM;  Surgeon: Leonie Man, MD;  Location: [facility] CATH LAB;  Service: Cardiovascular;  Laterality: N/A;   OPEN REDUCTION INTERNAL FIXATION (ORIF) TIBIA/FIBULA FRACTURE Left 10/01/2018   Procedure: OPEN REDUCTION INTERNAL FIXATION LEFT PILON FRACTURE;  Surgeon: Altamese , MD;  Location: [facility];  Service: Orthopedics;  Laterality: Left;   POLYPECTOMY  06/28/2018   Procedure: POLYPECTOMY;  Surgeon: Daneil Dolin, MD;  [facility];   Service: Endoscopy;;  (colon)    Social History   Socioeconomic History   Marital status: Single    Spouse name: Not on file   Number of children: Not on file   Years of education: Not on file   Highest education level: Not on file  Occupational History   Not on file  Tobacco Use   Smoking status: Every Day    Packs/day: 0.50    Years: 30.00    Pack years: 15.00    Types: Cigarettes    Start date: 10/25/1977   Smokeless tobacco: Never  Vaping Use   Vaping Use: Never used  Substance and Sexual Activity   Alcohol use: Not Currently    Alcohol/week: 6.0 standard drinks    Types: 6 Cans of beer per week    Comment: denied 06/08/21   Drug use: No   Sexual activity: Yes  Other Topics Concern   Not on file  Social History Narrative   Not on file   Social Determinants of Health   Financial Resource Strain: Not on file  Food Insecurity: Not on file  Transportation Needs: Not on file  Physical Activity: Not on file  Stress: Not on file  Social Connections: Not on file    Family History  Problem Relation Age of Onset   Diabetes Father    Hyperlipidemia Father    Hypertension Father    Hypertension Mother    Hyperlipidemia Mother    Hyperlipidemia Sister  Hypertension Sister    Diabetes Sister     Outpatient Encounter Medications as of 07/30/2021  Medication Sig   Insulin Pen Needle 31G X 5 MM MISC Use to inject insulin   Albuterol Sulfate (PROAIR HFA IN) Inhale 2 puffs into the lungs as needed.   aspirin 81 MG chewable tablet Chew 1 tablet (81 mg total) by mouth daily.   atorvastatin (LIPITOR) 40 MG tablet Take 40 mg by mouth at bedtime.   CRANBERRY PO Take 1 tablet by mouth every 14 (fourteen) days.   furosemide (LASIX) 20 MG tablet Take 1 tablet (20 mg total) by mouth daily as needed.   ibuprofen (ADVIL,MOTRIN) 200 MG tablet Take 800 mg by mouth every 6 (six) hours as needed for fever, headache or mild pain.    isosorbide mononitrate (IMDUR) 60 MG 24 hr tablet  Take 60 mg daily   LANTUS SOLOSTAR 100 UNIT/ML Solostar Pen Inject 40 Units into the skin at bedtime.   linaclotide (LINZESS) 72 MCG capsule Take 1 capsule (72 mcg total) by mouth daily before breakfast.   lisinopril (ZESTRIL) 2.5 MG tablet TAKE 1 TABLET(2.5 MG) BY MOUTH DAILY   metFORMIN (GLUCOPHAGE) 1000 MG tablet Take 1,000 mg by mouth 2 (two) times daily.   Multiple Vitamin (MULTIVITAMIN) tablet Take 1 tablet by mouth daily.   nitroGLYCERIN (NITROSTAT) 0.4 MG SL tablet Place 1 tablet (0.4 mg total) under the tongue every 5 (five) minutes x 3 doses as needed for chest pain (if no relief after 3rd dose, proceed to the ED for an evaluation or call 911).   Omega-3 Fatty Acids (SALMON OIL-1000 PO) Take 1 capsule by mouth daily.   oxyCODONE-acetaminophen (PERCOCET) 5-325 MG tablet Take 1 tablet by mouth every 6 (six) hours as needed.   zolpidem (AMBIEN) 10 MG tablet Take 10 mg by mouth at bedtime as needed.   [DISCONTINUED] Insulin Pen Needle (B-D ULTRAFINE III SHORT PEN) 31G X 8 MM MISC 1 each by Does not apply route as directed.   [DISCONTINUED] LANTUS SOLOSTAR 100 UNIT/ML Solostar Pen Inject 40 Units into the skin at bedtime.   No facility-administered encounter medications on file as of 07/30/2021.    ALLERGIES: No Known Allergies  VACCINATION STATUS: Immunization History  Administered Date(s) Administered   Influenza,inj,Quad PF,6+ Mos 05/27/2014   Tdap 05/29/2021    Diabetes He presents for his follow-up diabetic visit. He has type 2 diabetes mellitus. Onset time: He was diagnosed at approximate age of 16 years. His disease course has been improving. There are no hypoglycemic associated symptoms. Pertinent negatives for hypoglycemia include no confusion, headaches, pallor or seizures. Associated symptoms include blurred vision. Pertinent negatives for diabetes include no chest pain, no fatigue, no polydipsia, no polyphagia, no polyuria and no weakness. There are no hypoglycemic  complications. Symptoms are improving. Diabetic complications include heart disease. (He is status post stent placement in 1 large coronary artery.) Risk factors for coronary artery disease include dyslipidemia, diabetes mellitus, family history, hypertension, male sex, sedentary lifestyle and tobacco exposure. Current diabetic treatment includes insulin injections (He is currently on Lantus 14 units nightly, metformin 1000 mg p.o. twice daily.). His weight is fluctuating minimally. He is following a generally unhealthy diet. When asked about meal planning, he reported none. He has not had a previous visit with a dietitian. He never participates in exercise. His home blood glucose trend is decreasing steadily. (He presents with no meter nor logs.  His point-of-care A1c is 7%, improving from 9% during his last  visit.  He denies hypoglycemia.  He reports blood glucose ranges between 140-165 at fasting.   ) An ACE inhibitor/angiotensin II receptor blocker is being taken. Eye exam is current.  Hyperlipidemia This is a chronic problem. The problem is resistant. Exacerbating diseases include diabetes. Pertinent negatives include no chest pain, myalgias or shortness of breath. Current antihyperlipidemic treatment includes statins. Risk factors for coronary artery disease include dyslipidemia, diabetes mellitus, family history, male sex, hypertension and a sedentary lifestyle.  Hypertension This is a chronic problem. The current episode started more than 1 year ago. Associated symptoms include blurred vision. Pertinent negatives include no chest pain, headaches, neck pain, palpitations or shortness of breath. Risk factors for coronary artery disease include dyslipidemia, diabetes mellitus, smoking/tobacco exposure, sedentary lifestyle, male gender and family history. Past treatments include ACE inhibitors.    Review of Systems  Constitutional:  Negative for chills, fatigue, fever and unexpected weight change.   HENT:  Negative for dental problem, mouth sores and trouble swallowing.   Eyes:  Positive for blurred vision. Negative for visual disturbance.  Respiratory:  Negative for cough, choking, chest tightness, shortness of breath and wheezing.   Cardiovascular:  Negative for chest pain, palpitations and leg swelling.  Gastrointestinal:  Negative for abdominal distention, abdominal pain, constipation, diarrhea, nausea and vomiting.  Endocrine: Negative for polydipsia, polyphagia and polyuria.  Genitourinary:  Negative for dysuria, flank pain, hematuria and urgency.  Musculoskeletal:  Negative for back pain, gait problem, myalgias and neck pain.  Skin:  Negative for pallor, rash and wound.  Neurological:  Negative for seizures, syncope, weakness, numbness and headaches.  Psychiatric/Behavioral:  Negative for confusion and dysphoric mood.    Objective:    Vitals with BMI 07/30/2021 06/08/2021 05/29/2021  Height 6\' 0"  6\' 0"  -  Weight 214 lbs 13 oz 215 lbs -  BMI 81.44 81.85 -  Systolic 631 95 497  Diastolic 70 65 71  Pulse 60 94 70    BP 102/70    Pulse 60    Ht 6' (1.829 m)    Wt 214 lb 12.8 oz (97.4 kg)    BMI 29.13 kg/m   Wt Readings from Last 3 Encounters:  07/30/21 214 lb 12.8 oz (97.4 kg)  06/08/21 215 lb (97.5 kg)  05/29/21 218 lb (98.9 kg)      CMP ( most recent) CMP     Component Value Date/Time   NA 143 07/26/2021 0926   K 3.9 07/26/2021 0926   CL 102 07/26/2021 0926   CO2 26 07/26/2021 0926   GLUCOSE 115 (H) 07/26/2021 0926   GLUCOSE 134 (H) 05/29/2021 1635   BUN 12 07/26/2021 0926   CREATININE 0.96 07/26/2021 0926   CALCIUM 10.0 07/26/2021 0926   PROT 6.8 07/26/2021 0926   ALBUMIN 4.4 07/26/2021 0926   AST 20 07/26/2021 0926   ALT 22 07/26/2021 0926   ALKPHOS 93 07/26/2021 0926   BILITOT 0.3 07/26/2021 0926   GFRNONAA >60 05/29/2021 1620   GFRAA >60 02/06/2020 0035     Diabetic Labs (most recent): Lab Results  Component Value Date   HGBA1C 7.0 07/30/2021    HGBA1C 9.0 (A) 04/14/2021   HGBA1C 8.0 (H) 10/02/2018     Lipid Panel ( most recent) Lipid Panel     Component Value Date/Time   CHOL 189 07/26/2021 0926   TRIG 224 (H) 07/26/2021 0926   HDL 39 (L) 07/26/2021 0926   CHOLHDL 4.8 07/26/2021 0926   CHOLHDL 17.3 01/20/2014 0528   VLDL  UNABLE TO CALCULATE IF TRIGLYCERIDE OVER 400 mg/dL 01/20/2014 0528   LDLCALC 111 (H) 07/26/2021 0926   LABVLDL 39 07/26/2021 0926      Lab Results  Component Value Date   TSH 1.120 07/26/2021   TSH 1.100 01/19/2014   FREET4 1.37 07/26/2021      Assessment & Plan:   1. Type 2 diabetes mellitus with vascular disease ([place])   - [name] has currently uncontrolled symptomatic type 2 DM since  56 years of age.  He presents with no meter nor logs.  His point-of-care A1c is 7%, improving from 9% during his last visit.  He denies hypoglycemia.  He reports blood glucose ranges between 140-165 at fasting.     - I had a long discussion with him about the progressive nature of diabetes and the pathology behind its complications. -his diabetes is complicated by coronary artery disease, chronic heavy smoking, sedentary life and he remains at a high risk for more acute and chronic complications which include CAD, CVA, CKD, retinopathy, and neuropathy. These are all discussed in detail with him.  - I have counseled him on diet  and weight management  by adopting a carbohydrate restricted/protein rich diet. Patient is encouraged to switch to  unprocessed or minimally processed     complex starch and increased protein intake (animal or plant source), fruits, and vegetables. -  he is advised to stick to a routine mealtimes to eat 3 meals  a day and avoid unnecessary snacks ( to snack only to correct hypoglycemia).   - he acknowledges that there is a room for improvement in his food and drink choices. - Suggestion is made for him to avoid simple carbohydrates  from his diet including Cakes, Sweet Desserts, Ice  Cream, Soda (diet and regular), Sweet Tea, Candies, Chips, Cookies, Store Bought Juices, Alcohol , Artificial Sweeteners,  Coffee Creamer, and "Sugar-free" Products, Lemonade. This will help patient to have more stable blood glucose profile and potentially avoid unintended weight gain.  The following Lifestyle Medicine recommendations according to [place]  [facility]) were discussed and and offered to patient and he  agrees to start the journey:  A. Whole Foods, Plant-Based Nutrition comprising of fruits and vegetables, plant-based proteins, whole-grain carbohydrates was discussed in detail with the patient.   A list for source of those nutrients were also provided to the patient.  Patient will use only water or unsweetened tea for hydration. B.  The need to stay away from risky substances including alcohol, smoking; obtaining 7 to 9 hours of restorative sleep, at least 150 minutes of moderate intensity exercise weekly, the importance of healthy social connections,  and stress management techniques were discussed.  He is already benefiting from [employer] plant-based nutrition.   - he will be scheduled with Jearld Fenton, RDN, CDE for diabetes education.  - I have approached him with the following individualized plan to manage  his diabetes and patient agrees:   -In light of his presentation with continued current glycemic improvement and A1c of 7%, he will not need prandial insulin for now.  He is advised to continue Lantus 40 units nightly, associated with monitoring of blood glucose twice daily-daily before breakfast and at bedtime.   - he is warned not to take insulin without proper monitoring per orders.  - he is encouraged to call clinic for blood glucose levels less than 70 or above 200 mg /dl. - he is advised to continue metformin 1000  mg p.o. twice daily, therapeutically suitable for patient . -Reportedly, he did not tolerate Jardiance, Farxiga in the past and he  does not want to start any of those medications. -He has improved triglycerides of 224 from 1132.  He will be considered for GLP-1 receptor agonists if necessary for diabetes management on subsequent visits.   - Specific targets for  A1c;  LDL, HDL,  and Triglycerides were discussed with the patient.  2) Blood Pressure /Hypertension:  His blood pressure is controlled to target.  he is advised to continue his current medications including lisinopril 2.5 mg p.o. daily with breakfast . 3) Lipids/Hyperlipidemia:   Review of his recent lipid panel showed  controlled triglycerides in the past.  He is advised to continue Lipitor 40 mg p.o. nightly.  Side effects and precautions discussed with him.   4)  Weight/Diet:  Body mass index is 29.13 kg/m.  -      he is  a candidate for weight loss. I discussed with him the fact that loss of 5 - 10% of his  current body weight will have the most impact on his diabetes management.  Exercise, and detailed carbohydrates information provided  -  detailed on discharge instructions.  5) Chronic Care/Health Maintenance: 6) chronic smoker  -he  is on ACEI/ARB and Statin medications and  is encouraged to initiate and continue to follow up with Ophthalmology, Dentist,  Podiatrist at least yearly or according to recommendations, and advised to  quit smoking. I have recommended yearly flu vaccine and pneumonia vaccine at least every 5 years; moderate intensity exercise for up to 150 minutes weekly; and  sleep for at least 7 hours a day.  -He is started on low-dose vitamin D3 2000 units daily.  - he is  advised to maintain close follow up with Sharilyn Sites, MD for primary care needs, as well as his other providers for optimal and coordinated care.   I spent 44 minutes in the care of the patient today including review of labs from [facility], Lipids, Thyroid Function, Hematology (current and previous including abstractions from other facilities); face-to-face time discussing  his  blood glucose readings/logs, discussing hypoglycemia and hyperglycemia episodes and symptoms, medications doses, his options of short and long term treatment based on the latest standards of care / guidelines;  discussion about incorporating lifestyle medicine;  and documenting the encounter.    Please refer to Patient Instructions for Blood Glucose Monitoring and Insulin/Medications Dosing Guide"  in media tab for additional information. Please  also refer to " Patient Self Inventory" in the Media  tab for reviewed elements of pertinent patient history.  [name] participated in the discussions, expressed understanding, and voiced agreement with the above plans.  All questions were answered to his satisfaction. he is encouraged to contact clinic should he have any questions or concerns prior to his return visit.  Follow up plan: - Return in about 4 months (around 11/27/2021) for Bring Meter and Logs- A1c in Office.  Glade Lloyd, MD [facility] [address] [place], [place] [address] Phone: [phone]  Fax: [phone]    07/30/2021, 12:24 PM  This note was partially dictated with voice recognition software. Similar sounding words can be transcribed inadequately or may not  be corrected upon review.

## 2021-07-30 NOTE — Patient Instructions (Signed)

## 2021-08-10 ENCOUNTER — Ambulatory Visit: Payer: Medicare Other | Admitting: Internal Medicine

## 2021-08-23 ENCOUNTER — Other Ambulatory Visit: Payer: Self-pay | Admitting: Cardiology

## 2021-08-26 ENCOUNTER — Telehealth: Payer: Self-pay | Admitting: Cardiology

## 2021-08-26 DIAGNOSIS — M65331 Trigger finger, right middle finger: Secondary | ICD-10-CM | POA: Diagnosis not present

## 2021-08-26 DIAGNOSIS — G894 Chronic pain syndrome: Secondary | ICD-10-CM | POA: Diagnosis not present

## 2021-08-26 DIAGNOSIS — M65342 Trigger finger, left ring finger: Secondary | ICD-10-CM | POA: Diagnosis not present

## 2021-08-26 MED ORDER — NITROGLYCERIN 0.4 MG SL SUBL: 0.4000 mg | SUBLINGUAL_TABLET | SUBLINGUAL | 3 refills | Status: DC | PRN

## 2021-08-26 NOTE — Telephone Encounter (Signed)
°*  STAT* If patient is at the pharmacy, call can be transferred to refill team.   1. Which medications need to be refilled? (please list name of each medication and dose if known) nitroGLYCERIN (NITROSTAT) 0.4 MG SL tablet [[id number]]   2. Which pharmacy/location (including street and city if local pharmacy) is medication to be sent to? [facility] [address]

## 2021-08-29 ENCOUNTER — Other Ambulatory Visit: Payer: Self-pay | Admitting: "Endocrinology

## 2021-09-20 DIAGNOSIS — G894 Chronic pain syndrome: Secondary | ICD-10-CM | POA: Diagnosis not present

## 2021-09-22 ENCOUNTER — Other Ambulatory Visit: Payer: Self-pay | Admitting: Cardiology

## 2021-09-27 DIAGNOSIS — M19011 Primary osteoarthritis, right shoulder: Secondary | ICD-10-CM | POA: Diagnosis not present

## 2021-09-27 DIAGNOSIS — M75102 Unspecified rotator cuff tear or rupture of left shoulder, not specified as traumatic: Secondary | ICD-10-CM | POA: Diagnosis not present

## 2021-09-27 DIAGNOSIS — M19012 Primary osteoarthritis, left shoulder: Secondary | ICD-10-CM | POA: Diagnosis not present

## 2021-09-27 DIAGNOSIS — M75101 Unspecified rotator cuff tear or rupture of right shoulder, not specified as traumatic: Secondary | ICD-10-CM | POA: Diagnosis not present

## 2021-10-25 DIAGNOSIS — G894 Chronic pain syndrome: Secondary | ICD-10-CM | POA: Diagnosis not present

## 2021-10-25 DIAGNOSIS — D1720 Benign lipomatous neoplasm of skin and subcutaneous tissue of unspecified limb: Secondary | ICD-10-CM | POA: Diagnosis not present

## 2021-10-25 DIAGNOSIS — G4700 Insomnia, unspecified: Secondary | ICD-10-CM | POA: Diagnosis not present

## 2021-11-12 ENCOUNTER — Ambulatory Visit: Payer: Medicare Other | Admitting: Cardiology

## 2021-11-24 DIAGNOSIS — G894 Chronic pain syndrome: Secondary | ICD-10-CM | POA: Diagnosis not present

## 2021-11-24 DIAGNOSIS — G4700 Insomnia, unspecified: Secondary | ICD-10-CM | POA: Diagnosis not present

## 2021-11-24 NOTE — Progress Notes (Signed)
Referring Provider: Sharilyn Sites, MD Primary Care Physician:  Sharilyn Sites, MD Primary GI Physician: Dr. Gala Romney  Chief Complaint  Patient presents with   Follow-up    Rx needed    HPI:   [name] is a 56 y.o. male presenting today with history of chronic constipation, presenting today for follow-up of waxing and waning right-sided abdominal pain.  Last seen in our office on 06/08/2021 for further evaluation of her waxing and waning right-sided abdominal pain.  He was taking Linzess when he had skip 2 to 3 days between bowel movements as Linzess daily which caused diarrhea.  Associated right-sided abdominal pain and abdominal bloating in association with lack of bowel movement.  He had suffered blunt force trauma when he was cutting down a tree about 10 days prior, seen in the ED, and fortunately no serious intra-abdominal injuries as assessed by CT scan.  His abdominal exam was benign.  Recommended Linzess 72 mcg daily.  Plan to follow-up in 6 weeks.  Patient called on 12/1 reporting Linzess 72 mcg was working well.  Today: Doing well overall.  Takes Linzess as needed if he skips 1 or 2 days between a bowel movement.  This is less than weekly.  Overall, bowels have been moving fairly well.  Denies abdominal pain, BRBPR, melena, unintentional weight loss.  No upper GI symptoms such as nausea, vomiting, reflux symptoms, or dysphagia.   He reports he has tried over-the-counter medications previously such as stool softeners and MiraLAX, but these were not helpful.  Prefers to continue Linzess as needed.  Past Medical History:  Diagnosis Date   Anxiety    Borderline diabetes    Bulging disc    Chronic back pain    Closed intra-articular fracture of distal end of left tibia    Diabetes mellitus without complication (HCC)    HTN (hypertension)    Hypercholesteremia    Myocardial infarction [facility])    STEMI 2015 with BMS to RCA   Pancreatitis     Past Surgical History:   Procedure Laterality Date   ABDOMINAL SURGERY  1990   adhesions, blockage.    APPENDECTOMY     BACK SURGERY     CARDIAC CATHETERIZATION  2003   "trivial coronary artery disease," normal EF   COLONOSCOPY N/A 04/16/2015   Procedure: COLONOSCOPY;  Surgeon: Daneil Dolin, MD;  Location: [facility];  Service: Endoscopy;  Laterality: N/A;  12:45 PM   COLONOSCOPY WITH PROPOFOL N/A 06/28/2018   [place]: Diverticulosis, 2 tubular adenomas removed.  Next colonoscopy in 5 years.   LEFT HEART CATH AND CORONARY ANGIOGRAPHY N/A 05/09/2017   Procedure: LEFT HEART CATH AND CORONARY ANGIOGRAPHY;  Surgeon: Leonie Man, MD;  Location: [place] [facility];  Service: Cardiovascular;  Laterality: N/A;   LEFT HEART CATHETERIZATION WITH CORONARY ANGIOGRAM N/A 03/13/2014   Procedure: LEFT HEART CATHETERIZATION WITH CORONARY ANGIOGRAM;  Surgeon: Leonie Man, MD;  Location: [facility] CATH LAB;  Service: Cardiovascular;  Laterality: N/A;   OPEN REDUCTION INTERNAL FIXATION (ORIF) TIBIA/FIBULA FRACTURE Left 10/01/2018   Procedure: OPEN REDUCTION INTERNAL FIXATION LEFT PILON FRACTURE;  Surgeon: Altamese Cassopolis, MD;  Location: [place];  Service: Orthopedics;  Laterality: Left;   POLYPECTOMY  06/28/2018   Procedure: POLYPECTOMY;  Surgeon: Daneil Dolin, MD;  Location: [facility];  Service: Endoscopy;;  (colon)    Current Outpatient Medications  Medication Sig Dispense Refill   Albuterol Sulfate (PROAIR HFA IN) Inhale 2 puffs into the lungs  as needed.     aspirin 81 MG chewable tablet Chew 1 tablet (81 mg total) by mouth daily.     atorvastatin (LIPITOR) 40 MG tablet Take 40 mg by mouth at bedtime.     CRANBERRY PO Take 1 tablet by mouth every 14 (fourteen) days.     furosemide (LASIX) 20 MG tablet Take 1 tablet (20 mg total) by mouth daily as needed. 90 tablet 1   ibuprofen (ADVIL,MOTRIN) 200 MG tablet Take 800 mg by mouth every 6 (six) hours as needed for fever, headache or mild pain.      Insulin Pen Needle 31G X 5  MM MISC Use to inject insulin 100 each 1   isosorbide mononitrate (IMDUR) 60 MG 24 hr tablet TAKE 1 TABLET BY MOUTH DAILY 30 tablet 0   isosorbide mononitrate (IMDUR) 60 MG 24 hr tablet TAKE 1 TABLET BY MOUTH DAILY 90 tablet 3   LANTUS SOLOSTAR 100 UNIT/ML Solostar Pen ADMINISTER 40 UNITS UNDER THE SKIN AT BEDTIME 30 mL 1   lisinopril (ZESTRIL) 2.5 MG tablet TAKE 1 TABLET(2.5 MG) BY MOUTH DAILY 90 tablet 3   metFORMIN (GLUCOPHAGE) 1000 MG tablet Take 1,000 mg by mouth 2 (two) times daily.     Multiple Vitamin (MULTIVITAMIN) tablet Take 1 tablet by mouth daily.     nitroGLYCERIN (NITROSTAT) 0.4 MG SL tablet Place 1 tablet (0.4 mg total) under the tongue every 5 (five) minutes x 3 doses as needed for chest pain (if no relief after 2nd dose, proceed to the ED or call 911). 25 tablet 3   Omega-3 Fatty Acids (SALMON OIL-1000 PO) Take 1 capsule by mouth daily.     oxyCODONE-acetaminophen (PERCOCET) 5-325 MG tablet Take 1 tablet by mouth every 6 (six) hours as needed. 20 tablet 0   zolpidem (AMBIEN) 10 MG tablet Take 10 mg by mouth at bedtime as needed.     linaclotide (LINZESS) 72 MCG capsule Take 1 capsule (72 mcg total) by mouth daily before breakfast. 30 capsule 5   No current facility-administered medications for this visit.    Allergies as of 11/25/2021   (No Known Allergies)    Family History  Problem Relation Age of Onset   Hypertension Mother    Hyperlipidemia Mother    Diabetes Father    Hyperlipidemia Father    Hypertension Father    Hyperlipidemia Sister    Hypertension Sister    Diabetes Sister    Colon cancer Paternal Uncle        over age 37    Social History   Socioeconomic History   Marital status: Single    Spouse name: Not on file   Number of children: Not on file   Years of education: Not on file   Highest education level: Not on file  Occupational History   Not on file  Tobacco Use   Smoking status: Every Day    Packs/day: 0.50    Years: 30.00    Pack  years: 15.00    Types: Cigarettes    Start date: 10/25/1977   Smokeless tobacco: Never  Vaping Use   Vaping Use: Never used  Substance and Sexual Activity   Alcohol use: Not Currently    Alcohol/week: 6.0 standard drinks    Types: 6 Cans of beer per week    Comment: denied 06/08/21   Drug use: No   Sexual activity: Yes  Other Topics Concern   Not on file  Social History Narrative   Not on  file   Social Determinants of Health   Financial Resource Strain: Not on file  Food Insecurity: Not on file  Transportation Needs: Not on file  Physical Activity: Not on file  Stress: Not on file  Social Connections: Not on file    Review of Systems: Gen: Denies fever, chills, cold or flulike symptoms, presyncope, syncope. CV: Denies chest pain, palpitations. Resp: Denies dyspnea or cough. GI: See HPI Heme: See HPI  Physical Exam: BP 109/73   Pulse 85   Temp (!) 97.3 F (36.3 C)   Ht '6\' 1"'$  (1.854 m)   Wt 218 lb (98.9 kg)   BMI 28.76 kg/m  General:   Alert and oriented. No distress noted. Pleasant and cooperative.  Head:  Normocephalic and atraumatic. Eyes:  Conjuctiva clear without scleral icterus. Heart:  S1, S2 present without murmurs appreciated. Lungs:  Clear to auscultation bilaterally. No wheezes, rales, or rhonchi. No distress.  Abdomen:  +BS, soft, non-tender and non-distended. No rebound or guarding. No HSM or masses noted. Msk:  Symmetrical without gross deformities. Normal posture. Extremities:  Without edema. Neurologic:  Alert and  oriented x4 Psych:  Normal mood and affect.    Assessment:  56 year old male presenting today for follow-up of chronic constipation and previously with waxing and waning right-sided abdominal pain which has now resolved.  Constipation is fairly well managed with his diet, now using Linzess 72 mcg as needed, typically less than weekly.  If taking Linzess daily, he will have diarrhea.  Previously has tried over-the-counter stool  softeners and MiraLAX which were not helpful and he prefers to continue to use Linzess as needed.  He has no alarm symptoms.  He will be due for colonoscopy in December 2024 due to history of adenomatous colon polyps.   Plan:  Continue Linzess 72 mcg daily as needed.  Refill sent to pharmacy. To help prevent constipation, drink at least 64 ounces of water daily, consume plenty of daily fiber through whole grains, fruits, vegetables. Follow-up in 1 year or sooner if needed.   Aliene Altes, PA-C [facility] 11/25/2021

## 2021-11-25 ENCOUNTER — Ambulatory Visit (INDEPENDENT_AMBULATORY_CARE_PROVIDER_SITE_OTHER): Payer: Medicare Other | Admitting: Gastroenterology

## 2021-11-25 ENCOUNTER — Encounter: Payer: Self-pay | Admitting: Gastroenterology

## 2021-11-25 VITALS — BP 109/73 | HR 85 | Temp 97.3°F | Ht 73.0 in | Wt 218.0 lb

## 2021-11-25 DIAGNOSIS — K5900 Constipation, unspecified: Secondary | ICD-10-CM | POA: Diagnosis not present

## 2021-11-25 MED ORDER — LINACLOTIDE 72 MCG PO CAPS: 72.0000 ug | ORAL_CAPSULE | Freq: Every day | ORAL | 5 refills | Status: DC

## 2021-11-25 NOTE — Patient Instructions (Signed)
I have sent in a refill of Linzess 72 mcg to your pharmacy.  You may continue to use this as needed for constipation.  To help prevent constipation, eat plenty of fiber through fruits, vegetables, whole grains daily.  Drink at least 64 ounces of water daily.  We will plan to follow-up with you in 1 year.  Do not hesitate to call sooner if you have any questions or concerns.  It was nice meeting you today!  Aliene Altes, PA-C [facility]

## 2021-11-29 ENCOUNTER — Ambulatory Visit: Payer: Medicare Other | Admitting: "Endocrinology

## 2021-12-14 DIAGNOSIS — H1189 Other specified disorders of conjunctiva: Secondary | ICD-10-CM | POA: Diagnosis not present

## 2021-12-16 ENCOUNTER — Encounter: Payer: Self-pay | Admitting: "Endocrinology

## 2021-12-16 ENCOUNTER — Ambulatory Visit (INDEPENDENT_AMBULATORY_CARE_PROVIDER_SITE_OTHER): Payer: Medicare Other | Admitting: "Endocrinology

## 2021-12-16 VITALS — BP 108/78 | HR 92 | Ht 73.0 in | Wt 219.6 lb

## 2021-12-16 DIAGNOSIS — I10 Essential (primary) hypertension: Secondary | ICD-10-CM

## 2021-12-16 DIAGNOSIS — E782 Mixed hyperlipidemia: Secondary | ICD-10-CM | POA: Diagnosis not present

## 2021-12-16 DIAGNOSIS — E1159 Type 2 diabetes mellitus with other circulatory complications: Secondary | ICD-10-CM | POA: Diagnosis not present

## 2021-12-16 DIAGNOSIS — E119 Type 2 diabetes mellitus without complications: Secondary | ICD-10-CM | POA: Diagnosis not present

## 2021-12-16 DIAGNOSIS — F17200 Nicotine dependence, unspecified, uncomplicated: Secondary | ICD-10-CM | POA: Diagnosis not present

## 2021-12-16 LAB — POCT GLYCOSYLATED HEMOGLOBIN (HGB A1C): HbA1c, POC (controlled diabetic range): 7.9 % — AB (ref 0.0–7.0)

## 2021-12-16 MED ORDER — INSULIN PEN NEEDLE 31G X 5 MM MISC: 1 refills | Status: DC

## 2021-12-16 NOTE — Patient Instructions (Signed)

## 2021-12-16 NOTE — Progress Notes (Signed)
12/16/2021, 12:52 PM  Endocrinology follow-up note   Subjective:    Patient ID: [name], male    DOB: 23-May-1966.  [name] is being seen in follow-up after he was seen in consultation for management of currently uncontrolled symptomatic diabetes requested by  Sharilyn Sites, MD.   Past Medical History:  Diagnosis Date   Anxiety    Borderline diabetes    Bulging disc    Chronic back pain    Closed intra-articular fracture of distal end of left tibia    Diabetes mellitus without complication ([place])    HTN (hypertension)    Hypercholesteremia    Myocardial infarction [facility])    STEMI 2015 with BMS to RCA   Pancreatitis     Past Surgical History:  Procedure Laterality Date   ABDOMINAL SURGERY  1990   adhesions, blockage.    APPENDECTOMY     BACK SURGERY     CARDIAC CATHETERIZATION  2003   "trivial coronary artery disease," normal EF   COLONOSCOPY N/A 04/16/2015   Procedure: COLONOSCOPY;  Surgeon: Daneil Dolin, MD;  [facility];  Service: Endoscopy;  Laterality: N/A;  12:45 PM   COLONOSCOPY WITH PROPOFOL N/A 06/28/2018   [place]: Diverticulosis, 2 tubular adenomas removed.  Next colonoscopy in 5 years.   LEFT HEART CATH AND CORONARY ANGIOGRAPHY N/A 05/09/2017   Procedure: LEFT HEART CATH AND CORONARY ANGIOGRAPHY;  Surgeon: Leonie Man, MD;  Location: [place] CV LAB;  Service: Cardiovascular;  Laterality: N/A;   LEFT HEART CATHETERIZATION WITH CORONARY ANGIOGRAM N/A 03/13/2014   Procedure: LEFT HEART CATHETERIZATION WITH CORONARY ANGIOGRAM;  Surgeon: Leonie Man, MD;  Location: [facility] CATH LAB;  Service: Cardiovascular;  Laterality: N/A;   OPEN REDUCTION INTERNAL FIXATION (ORIF) TIBIA/FIBULA FRACTURE Left 10/01/2018   Procedure: OPEN REDUCTION INTERNAL FIXATION LEFT PILON FRACTURE;  Surgeon: Altamese Ewa Villages, MD;  Location: [place];  Service: Orthopedics;  Laterality: Left;   POLYPECTOMY  06/28/2018   Procedure: POLYPECTOMY;  Surgeon:  Daneil Dolin, MD;  [facility];  Service: Endoscopy;;  (colon)    Social History   Socioeconomic History   Marital status: Single    Spouse name: Not on file   Number of children: Not on file   Years of education: Not on file   Highest education level: Not on file  Occupational History   Not on file  Tobacco Use   Smoking status: Every Day    Packs/day: 0.50    Years: 30.00    Total pack years: 15.00    Types: Cigarettes    Start date: 10/25/1977   Smokeless tobacco: Never  Vaping Use   Vaping Use: Never used  Substance and Sexual Activity   Alcohol use: Not Currently    Alcohol/week: 6.0 standard drinks of alcohol    Types: 6 Cans of beer per week    Comment: denied 06/08/21   Drug use: No   Sexual activity: Yes  Other Topics Concern   Not on file  Social History Narrative   Not on file   Social Determinants of Health   Financial Resource Strain: Not on file  Food Insecurity: Not on file  Transportation Needs: Not on file  Physical Activity: Not on file  Stress: Not on file  Social Connections: Not on file    Family History  Problem Relation Age of Onset   Hypertension Mother    Hyperlipidemia Mother    Diabetes  Father    Hyperlipidemia Father    Hypertension Father    Hyperlipidemia Sister    Hypertension Sister    Diabetes Sister    Colon cancer Paternal Uncle        over age 25    Outpatient Encounter Medications as of 12/16/2021  Medication Sig   Albuterol Sulfate (PROAIR HFA IN) Inhale 2 puffs into the lungs as needed.   aspirin 81 MG chewable tablet Chew 1 tablet (81 mg total) by mouth daily.   atorvastatin (LIPITOR) 40 MG tablet Take 40 mg by mouth at bedtime.   CRANBERRY PO Take 1 tablet by mouth every 14 (fourteen) days.   furosemide (LASIX) 20 MG tablet Take 1 tablet (20 mg total) by mouth daily as needed.   ibuprofen (ADVIL,MOTRIN) 200 MG tablet Take 800 mg by mouth every 6 (six) hours as needed for fever, headache or mild  pain.    Insulin Pen Needle 31G X 5 MM MISC Use to inject insulin   isosorbide mononitrate (IMDUR) 60 MG 24 hr tablet TAKE 1 TABLET BY MOUTH DAILY   isosorbide mononitrate (IMDUR) 60 MG 24 hr tablet TAKE 1 TABLET BY MOUTH DAILY   LANTUS SOLOSTAR 100 UNIT/ML Solostar Pen ADMINISTER 40 UNITS UNDER THE SKIN AT BEDTIME   linaclotide (LINZESS) 72 MCG capsule Take 1 capsule (72 mcg total) by mouth daily before breakfast.   lisinopril (ZESTRIL) 2.5 MG tablet TAKE 1 TABLET(2.5 MG) BY MOUTH DAILY   metFORMIN (GLUCOPHAGE) 1000 MG tablet Take 1,000 mg by mouth 2 (two) times daily.   Multiple Vitamin (MULTIVITAMIN) tablet Take 1 tablet by mouth daily.   nitroGLYCERIN (NITROSTAT) 0.4 MG SL tablet Place 1 tablet (0.4 mg total) under the tongue every 5 (five) minutes x 3 doses as needed for chest pain (if no relief after 2nd dose, proceed to the ED or call 911).   Omega-3 Fatty Acids (SALMON OIL-1000 PO) Take 1 capsule by mouth daily.   oxyCODONE-acetaminophen (PERCOCET) 5-325 MG tablet Take 1 tablet by mouth every 6 (six) hours as needed.   zolpidem (AMBIEN) 10 MG tablet Take 10 mg by mouth at bedtime as needed.   [DISCONTINUED] Insulin Pen Needle 31G X 5 MM MISC Use to inject insulin   No facility-administered encounter medications on file as of 12/16/2021.    ALLERGIES: No Known Allergies  VACCINATION STATUS: Immunization History  Administered Date(s) Administered   Influenza,inj,Quad PF,6+ Mos 05/27/2014   Tdap 05/29/2021    Diabetes He presents for his follow-up diabetic visit. He has type 2 diabetes mellitus. Onset time: He was diagnosed at approximate age of 56 years. His disease course has been worsening. There are no hypoglycemic associated symptoms. Pertinent negatives for hypoglycemia include no confusion, headaches, pallor or seizures. Pertinent negatives for diabetes include no blurred vision, no chest pain, no fatigue, no polydipsia, no polyphagia, no polyuria and no weakness. There are no  hypoglycemic complications. Symptoms are worsening. Diabetic complications include heart disease. (He is status post stent placement in 1 large coronary artery.) Risk factors for coronary artery disease include dyslipidemia, diabetes mellitus, family history, hypertension, male sex, sedentary lifestyle and tobacco exposure. Current diabetic treatment includes insulin injections (He is currently on Lantus 14 units nightly, metformin 1000 mg p.o. twice daily.). His weight is fluctuating minimally. He is following a generally unhealthy diet. When asked about meal planning, he reported none. He has not had a previous visit with a dietitian. He never participates in exercise. His home blood glucose trend is  increasing steadily. (He did not bring any logs nor meter.  His point-of-care A1c is increasing to 7.9% from 7%.  He did not document or report hypoglycemia.  ) An ACE inhibitor/angiotensin II receptor blocker is being taken. Eye exam is current.  Hyperlipidemia This is a chronic problem. The problem is resistant. Exacerbating diseases include diabetes. Pertinent negatives include no chest pain, myalgias or shortness of breath. Current antihyperlipidemic treatment includes statins. Risk factors for coronary artery disease include dyslipidemia, diabetes mellitus, family history, male sex, hypertension and a sedentary lifestyle.  Hypertension This is a chronic problem. The current episode started more than 1 year ago. Pertinent negatives include no blurred vision, chest pain, headaches, neck pain, palpitations or shortness of breath. Risk factors for coronary artery disease include dyslipidemia, diabetes mellitus, smoking/tobacco exposure, sedentary lifestyle, male gender and family history. Past treatments include ACE inhibitors.     Review of Systems  Constitutional:  Negative for chills, fatigue, fever and unexpected weight change.  HENT:  Negative for dental problem, mouth sores and trouble swallowing.    Eyes:  Negative for blurred vision and visual disturbance.  Respiratory:  Negative for cough, choking, chest tightness, shortness of breath and wheezing.   Cardiovascular:  Negative for chest pain, palpitations and leg swelling.  Gastrointestinal:  Negative for abdominal distention, abdominal pain, constipation, diarrhea, nausea and vomiting.  Endocrine: Negative for polydipsia, polyphagia and polyuria.  Genitourinary:  Negative for dysuria, flank pain, hematuria and urgency.  Musculoskeletal:  Negative for back pain, gait problem, myalgias and neck pain.  Skin:  Negative for pallor, rash and wound.  Neurological:  Negative for seizures, syncope, weakness, numbness and headaches.  Psychiatric/Behavioral:  Negative for confusion and dysphoric mood.     Objective:       12/16/2021   10:11 AM 11/25/2021    9:59 AM 07/30/2021    9:48 AM  Vitals with BMI  Height '6\' 1"'$  '6\' 1"'$  '6\' 0"'$   Weight 219 lbs 10 oz 218 lbs 214 lbs 13 oz  BMI 28.98 42.70 62.37  Systolic 628 315 176  Diastolic 78 73 70  Pulse 92 85 60    BP 108/78   Pulse 92   Ht '6\' 1"'$  (1.854 m)   Wt 219 lb 9.6 oz (99.6 kg)   BMI 28.97 kg/m   Wt Readings from Last 3 Encounters:  12/16/21 219 lb 9.6 oz (99.6 kg)  11/25/21 218 lb (98.9 kg)  07/30/21 214 lb 12.8 oz (97.4 kg)      CMP ( most recent) CMP     Component Value Date/Time   NA 143 07/26/2021 0926   K 3.9 07/26/2021 0926   CL 102 07/26/2021 0926   CO2 26 07/26/2021 0926   GLUCOSE 115 (H) 07/26/2021 0926   GLUCOSE 134 (H) 05/29/2021 1635   BUN 12 07/26/2021 0926   CREATININE 0.96 07/26/2021 0926   CALCIUM 10.0 07/26/2021 0926   PROT 6.8 07/26/2021 0926   ALBUMIN 4.4 07/26/2021 0926   AST 20 07/26/2021 0926   ALT 22 07/26/2021 0926   ALKPHOS 93 07/26/2021 0926   BILITOT 0.3 07/26/2021 0926   GFRNONAA >60 05/29/2021 1620   GFRAA >60 02/06/2020 0035     Diabetic Labs (most recent): Lab Results  Component Value Date   HGBA1C 7.9 (A) 12/16/2021   HGBA1C  7.0 07/30/2021   HGBA1C 9.0 (A) 04/14/2021     Lipid Panel ( most recent) Lipid Panel     Component Value Date/Time   CHOL 189  07/26/2021 0926   TRIG 224 (H) 07/26/2021 0926   HDL 39 (L) 07/26/2021 0926   CHOLHDL 4.8 07/26/2021 0926   CHOLHDL 17.3 01/20/2014 0528   VLDL UNABLE TO CALCULATE IF TRIGLYCERIDE OVER 400 mg/dL 01/20/2014 0528   LDLCALC 111 (H) 07/26/2021 0926   LABVLDL 39 07/26/2021 0926      Lab Results  Component Value Date   TSH 1.120 07/26/2021   TSH 1.100 01/19/2014   FREET4 1.37 07/26/2021      Assessment & Plan:   1. Type 2 diabetes mellitus with vascular disease ([place])   - [name] has currently uncontrolled symptomatic type 2 DM since  56 years of age.  He did not bring any logs nor meter.  His point-of-care A1c is increasing to 7.9% from 7%.  He did not document or report hypoglycemia.   - I had a long discussion with him about the progressive nature of diabetes and the pathology behind its complications. -his diabetes is complicated by coronary artery disease, chronic heavy smoking, sedentary life and he remains at a high risk for more acute and chronic complications which include CAD, CVA, CKD, retinopathy, and neuropathy. These are all discussed in detail with him.  - I have counseled him on diet  and weight management  by adopting a carbohydrate restricted/protein rich diet. Patient is encouraged to switch to  unprocessed or minimally processed     complex starch and increased protein intake (animal or plant source), fruits, and vegetables. -  he is advised to stick to a routine mealtimes to eat 3 meals  a day and avoid unnecessary snacks ( to snack only to correct hypoglycemia).   - he acknowledges that there is a room for improvement in his food and drink choices. - Suggestion is made for him to avoid simple carbohydrates  from his diet including Cakes, Sweet Desserts, Ice Cream, Soda (diet and regular), Sweet Tea, Candies, Chips, Cookies, Store  Bought Juices, Alcohol , Artificial Sweeteners,  Coffee Creamer, and "Sugar-free" Products, Lemonade. This will help patient to have more stable blood glucose profile and potentially avoid unintended weight gain.  The following Lifestyle Medicine recommendations according to [place]  [facility]) were discussed and and offered to patient and he  agrees to start the journey:  A. Whole Foods, Plant-Based Nutrition comprising of fruits and vegetables, plant-based proteins, whole-grain carbohydrates was discussed in detail with the patient.   A list for source of those nutrients were also provided to the patient.  Patient will use only water or unsweetened tea for hydration. B.  The need to stay away from risky substances including alcohol, smoking; obtaining 7 to 9 hours of restorative sleep, at least 150 minutes of moderate intensity exercise weekly, the importance of healthy social connections,  and stress management techniques were discussed. C.  A full color page of  Calorie density of various food groups per pound showing examples of each food groups was provided to the patient.   - he will be scheduled with Jearld Fenton, RDN, CDE for diabetes education.  - I have approached him with the following individualized plan to manage  his diabetes and patient agrees:   -In light of his presentation with loss of control, he may need higher dose of insulin, however, he is not committed for monitoring.  He is approached to start monitoring blood glucose at least twice a day-before breakfast and at bedtime and return with his meter and logs next visit.  In the meantime, he is advised to continue Lantus 40 units nightly, continue metformin 1000 mg p.o. twice daily.    - he is warned not to take insulin without proper monitoring per orders.  - he is encouraged to call clinic for blood glucose levels less than 70 or above 200 mg /dl.  -Reportedly, he did not tolerate Jardiance,  Farxiga in the past and he does not want to start any of those medications. -He has improved triglycerides of 224 from 1132.  He will be considered for GLP-1 receptor agonists if necessary for diabetes management on subsequent visits.   - Specific targets for  A1c;  LDL, HDL,  and Triglycerides were discussed with the patient.  2) Blood Pressure /Hypertension:  -His blood pressure is controlled to target  .  he is advised to continue his current medications including lisinopril 2.5 mg p.o. daily with breakfast . 3) Lipids/Hyperlipidemia:   Review of his recent lipid panel showed uncontrolled LDL at 111 although it is improving overall from undetectable LDL due to severe hypertriglyceridemia of 2000+.     He is advised to continue Lipitor 40 mg p.o. nightly.  Side effects and precautions discussed with him.  Whole food plant-based diet will help with dyslipidemia as well.  4)  Weight/Diet:  Body mass index is 28.97 kg/m.  -      he is  a candidate for weight loss. I discussed with him the fact that loss of 5 - 10% of his  current body weight will have the most impact on his diabetes management.  Exercise, and detailed carbohydrates information provided  -  detailed on discharge instructions.  5) Chronic Care/Health Maintenance: 6) chronic smoker  -he  is on ACEI/ARB and Statin medications and  is encouraged to initiate and continue to follow up with Ophthalmology, Dentist,  Podiatrist at least yearly or according to recommendations, and advised to  quit smoking. I have recommended yearly flu vaccine and pneumonia vaccine at least every 5 years; moderate intensity exercise for up to 150 minutes weekly; and  sleep for at least 7 hours a day.  -He is started on low-dose vitamin D3 2000 units daily.  The patient was counseled on the dangers of tobacco use, and was advised to quit.  Reviewed strategies to maximize success, including removing cigarettes and smoking materials from environment.   - he  is  advised to maintain close follow up with Sharilyn Sites, MD for primary care needs, as well as his other providers for optimal and coordinated care.    I spent 40 minutes in the care of the patient today including review of labs from [place], Lipids, Thyroid Function, Hematology (current and previous including abstractions from other facilities); face-to-face time discussing  his blood glucose readings/logs, discussing hypoglycemia and hyperglycemia episodes and symptoms, medications doses, his options of short and long term treatment based on the latest standards of care / guidelines;  discussion about incorporating lifestyle medicine;  and documenting the encounter.    Please refer to Patient Instructions for Blood Glucose Monitoring and Insulin/Medications Dosing Guide"  in media tab for additional information. Please  also refer to " Patient Self Inventory" in the Media  tab for reviewed elements of pertinent patient history.  [name] participated in the discussions, expressed understanding, and voiced agreement with the above plans.  All questions were answered to his satisfaction. he is encouraged to contact clinic should he have any questions or concerns prior to his return visit.  Follow up plan: - Return in about 3 months (around 03/18/2022) for F/U with Pre-visit Labs, Meter/CGM/Logs, A1c here, Urine MA - [place].  Glade Lloyd, MD [facility] [address] [place], [place] [address] Phone: [phone]  Fax: [phone]    12/16/2021, 12:52 PM  This note was partially dictated with voice recognition software. Similar sounding words can be transcribed inadequately or may not  be corrected upon review.

## 2021-12-21 ENCOUNTER — Encounter: Payer: Self-pay | Admitting: Cardiology

## 2021-12-21 ENCOUNTER — Ambulatory Visit (INDEPENDENT_AMBULATORY_CARE_PROVIDER_SITE_OTHER): Payer: Medicare Other | Admitting: Cardiology

## 2021-12-21 VITALS — BP 110/70 | HR 66 | Ht 73.0 in | Wt 219.6 lb

## 2021-12-21 DIAGNOSIS — E782 Mixed hyperlipidemia: Secondary | ICD-10-CM | POA: Diagnosis not present

## 2021-12-21 DIAGNOSIS — I2510 Atherosclerotic heart disease of native coronary artery without angina pectoris: Secondary | ICD-10-CM

## 2021-12-21 MED ORDER — ATORVASTATIN CALCIUM 80 MG PO TABS: 80.0000 mg | ORAL_TABLET | Freq: Every day | ORAL | 1 refills | Status: DC

## 2021-12-21 NOTE — Progress Notes (Signed)
Clinical Summary [name] is a 56 y.o.male seen today for follow up of the following medical problems.    1. CAD - admit 03/2014 with inferior STEMI, received BMS to RCA. LVEF 45-50% by LV gram. 04/2017 cath: patent coronaries, patent RCA stent. Elevated LVEDP 26  - 03/2020 nuclear stress: no ischemia     - no chest pains, no SOB/DOE - compliant with meds     2. Hyperlipidemia - tolerates pravastatin, reports prior myaglias on prior statins - elevated TGs by last labs. Was to have repeat labs but does not appear this was done. Most recent labs he reports he had coffee with whole milk prior to labs, was not a true fasting lab - muscle aches on gemfibrozil in the past, tolerated fenofibrate but had some financial issues with costs of medicines about 5 years ago and medication was stopped      01/2020 TG 1120. Has made drastic changes in his diet, reports HgbA1c down from 10 to 7. Reports upcoming labs with pcp - reports most recent labs with pcp - remains on statin, fish oil  Jan 2023 TG 224, LDL 111  Past Medical History:  Diagnosis Date   Anxiety    Borderline diabetes    Bulging disc    Chronic back pain    Closed intra-articular fracture of distal end of left tibia    Diabetes mellitus without complication (HCC)    HTN (hypertension)    Hypercholesteremia    Myocardial infarction [facility])    STEMI 2015 with BMS to RCA   Pancreatitis      No Known Allergies   Current Outpatient Medications  Medication Sig Dispense Refill   Albuterol Sulfate (PROAIR HFA IN) Inhale 2 puffs into the lungs as needed.     aspirin 81 MG chewable tablet Chew 1 tablet (81 mg total) by mouth daily.     atorvastatin (LIPITOR) 40 MG tablet Take 40 mg by mouth at bedtime.     CRANBERRY PO Take 1 tablet by mouth every 14 (fourteen) days.     furosemide (LASIX) 20 MG tablet Take 1 tablet (20 mg total) by mouth daily as needed. 90 tablet 1   ibuprofen (ADVIL,MOTRIN) 200 MG tablet Take 800 mg by  mouth every 6 (six) hours as needed for fever, headache or mild pain.      Insulin Pen Needle 31G X 5 MM MISC Use to inject insulin 100 each 1   isosorbide mononitrate (IMDUR) 60 MG 24 hr tablet TAKE 1 TABLET BY MOUTH DAILY 30 tablet 0   isosorbide mononitrate (IMDUR) 60 MG 24 hr tablet TAKE 1 TABLET BY MOUTH DAILY 90 tablet 3   LANTUS SOLOSTAR 100 UNIT/ML Solostar Pen ADMINISTER 40 UNITS UNDER THE SKIN AT BEDTIME 30 mL 1   linaclotide (LINZESS) 72 MCG capsule Take 1 capsule (72 mcg total) by mouth daily before breakfast. 30 capsule 5   lisinopril (ZESTRIL) 2.5 MG tablet TAKE 1 TABLET(2.5 MG) BY MOUTH DAILY 90 tablet 3   metFORMIN (GLUCOPHAGE) 1000 MG tablet Take 1,000 mg by mouth 2 (two) times daily.     Multiple Vitamin (MULTIVITAMIN) tablet Take 1 tablet by mouth daily.     nitroGLYCERIN (NITROSTAT) 0.4 MG SL tablet Place 1 tablet (0.4 mg total) under the tongue every 5 (five) minutes x 3 doses as needed for chest pain (if no relief after 2nd dose, proceed to the ED or call 911). 25 tablet 3   Omega-3 Fatty Acids (SALMON  OIL-1000 PO) Take 1 capsule by mouth daily.     oxyCODONE-acetaminophen (PERCOCET) 5-325 MG tablet Take 1 tablet by mouth every 6 (six) hours as needed. 20 tablet 0   zolpidem (AMBIEN) 10 MG tablet Take 10 mg by mouth at bedtime as needed.     No current facility-administered medications for this visit.     Past Surgical History:  Procedure Laterality Date   ABDOMINAL SURGERY  1990   adhesions, blockage.    APPENDECTOMY     BACK SURGERY     CARDIAC CATHETERIZATION  2003   "trivial coronary artery disease," normal EF   COLONOSCOPY N/A 04/16/2015   Procedure: COLONOSCOPY;  Surgeon: Daneil Dolin, MD;  Location: [facility];  Service: Endoscopy;  Laterality: N/A;  12:45 PM   COLONOSCOPY WITH PROPOFOL N/A 06/28/2018   [place]: Diverticulosis, 2 tubular adenomas removed.  Next colonoscopy in 5 years.   LEFT HEART CATH AND CORONARY ANGIOGRAPHY N/A 05/09/2017   Procedure:  LEFT HEART CATH AND CORONARY ANGIOGRAPHY;  Surgeon: Leonie Man, MD;  Location: [place] [facility];  Service: Cardiovascular;  Laterality: N/A;   LEFT HEART CATHETERIZATION WITH CORONARY ANGIOGRAM N/A 03/13/2014   Procedure: LEFT HEART CATHETERIZATION WITH CORONARY ANGIOGRAM;  Surgeon: Leonie Man, MD;  Location: [facility];  Service: Cardiovascular;  Laterality: N/A;   OPEN REDUCTION INTERNAL FIXATION (ORIF) TIBIA/FIBULA FRACTURE Left 10/01/2018   Procedure: OPEN REDUCTION INTERNAL FIXATION LEFT PILON FRACTURE;  Surgeon: Altamese Alexander, MD;  Location: [place];  Service: Orthopedics;  Laterality: Left;   POLYPECTOMY  06/28/2018   Procedure: POLYPECTOMY;  Surgeon: Daneil Dolin, MD;  Location: [facility];  Service: Endoscopy;;  (colon)     No Known Allergies    Family History  Problem Relation Age of Onset   Hypertension Mother    Hyperlipidemia Mother    Diabetes Father    Hyperlipidemia Father    Hypertension Father    Hyperlipidemia Sister    Hypertension Sister    Diabetes Sister    Colon cancer Paternal Uncle        over age 2     Social History [name] reports that he has been smoking cigarettes. He started smoking about 44 years ago. He has a 15.00 pack-year smoking history. He has never used smokeless tobacco. [name] reports that he does not currently use alcohol after a past usage of about 6.0 standard drinks of alcohol per week.   Review of Systems CONSTITUTIONAL: No weight loss, fever, chills, weakness or fatigue.  HEENT: Eyes: No visual loss, blurred vision, double vision or yellow sclerae.No hearing loss, sneezing, congestion, runny nose or sore throat.  SKIN: No rash or itching.  CARDIOVASCULAR: per hpi RESPIRATORY: No shortness of breath, cough or sputum.  GASTROINTESTINAL: No anorexia, nausea, vomiting or diarrhea. No abdominal pain or blood.  GENITOURINARY: No burning on urination, no polyuria NEUROLOGICAL: No headache, dizziness, syncope,  paralysis, ataxia, numbness or tingling in the extremities. No change in bowel or bladder control.  MUSCULOSKELETAL: No muscle, back pain, joint pain or stiffness.  LYMPHATICS: No enlarged nodes. No history of splenectomy.  PSYCHIATRIC: No history of depression or anxiety.  ENDOCRINOLOGIC: No reports of sweating, cold or heat intolerance. No polyuria or polydipsia.  Marland Kitchen   Physical Examination Today's Vitals   12/21/21 1122  BP: 110/70  Pulse: 66  SpO2: 96%  Weight: 219 lb 9.6 oz (99.6 kg)  Height: '6\' 1"'$  (1.854 m)   Body mass index is  28.97 kg/m.  Gen: resting comfortably, no acute distress HEENT: no scleral icterus, pupils equal round and reactive, no palptable cervical adenopathy,  CV: RRR, no mrg, no jvd Resp: Clear to auscultation bilaterally GI: abdomen is soft, non-tender, non-distended, normal bowel sounds, no hepatosplenomegaly MSK: extremities are warm, no edema.  Skin: warm, no rash Neuro:  no focal deficits Psych: appropriate affect   Diagnostic Studies   02/2017 nuclear stress No diagnostic ST segment changes to indicate ischemia. Small, mild intensity, apical to basal inferior defect is partially reversible and suggestive of ischemia, although there is gut radiotracer uptake artifact adjacent to the inferior wall on rest imaging which could also be contributing to the defect reversibility. This is a low risk study. Nuclear stress EF: 63%.     04/2017 cath   Angiographically normal coronary arteries Dist RCA BMS (VeriFlex 5.0 x 15), 0 %stenosed. The left ventricular systolic function is normal. The left ventricular ejection fraction is 50-55% by visual estimate. LV end diastolic pressure is moderately elevated.   Patient essentially has intra-graphic normal coronary arteries with widely RCA.  No culprit lesion to explain inferior ischemia. Elevated LVEDP could explain microvascular ischemia.   Otherwise consider non-anginal etiology for chest pain.      Plan: Discharge home today after bed rest with TR band removal.   03/2020 nuclear stress No diagnostic ST segment changes to indicate ischemia. Medium sized, moderate intensity, fixed apical to basal inferior defect that is more prominent at rest and consistent with soft tissue attenuation versus scar. No substantial ischemic territories. This is a low risk study. Nuclear stress EF: 51%.    Assessment and Plan   1. CAD -no recent symptoms, continue current meds     2. Hyperlipidemia -TGs much improved. - LDL above goal, try increase atorva to '80mg'$  daily. Prior myalgias on statins in the past but will see if can tolerate.        Arnoldo Lenis, M.D.

## 2021-12-21 NOTE — Patient Instructions (Signed)
Medication Instructions:  Your physician has recommended you make the following change in your medication:  Increase atorvastatin to 80 mg once a day Continue all other medications as prescribed  Labwork: none  Testing/Procedures: none  Follow-Up: Your physician recommends that you schedule a follow-up appointment in: 6 months  Any Other Special Instructions Will Be Listed Below (If Applicable).  If you need a refill on your cardiac medications before your next appointment, please call your pharmacy.

## 2021-12-24 DIAGNOSIS — J40 Bronchitis, not specified as acute or chronic: Secondary | ICD-10-CM | POA: Diagnosis not present

## 2021-12-24 DIAGNOSIS — J302 Other seasonal allergic rhinitis: Secondary | ICD-10-CM | POA: Diagnosis not present

## 2021-12-24 DIAGNOSIS — R062 Wheezing: Secondary | ICD-10-CM | POA: Diagnosis not present

## 2021-12-24 DIAGNOSIS — G894 Chronic pain syndrome: Secondary | ICD-10-CM | POA: Diagnosis not present

## 2021-12-24 DIAGNOSIS — G4700 Insomnia, unspecified: Secondary | ICD-10-CM | POA: Diagnosis not present

## 2022-01-03 DIAGNOSIS — M75101 Unspecified rotator cuff tear or rupture of right shoulder, not specified as traumatic: Secondary | ICD-10-CM | POA: Diagnosis not present

## 2022-01-03 DIAGNOSIS — M75102 Unspecified rotator cuff tear or rupture of left shoulder, not specified as traumatic: Secondary | ICD-10-CM | POA: Diagnosis not present

## 2022-01-17 DIAGNOSIS — M65332 Trigger finger, left middle finger: Secondary | ICD-10-CM | POA: Diagnosis not present

## 2022-01-17 DIAGNOSIS — M65331 Trigger finger, right middle finger: Secondary | ICD-10-CM | POA: Diagnosis not present

## 2022-01-24 DIAGNOSIS — G4700 Insomnia, unspecified: Secondary | ICD-10-CM | POA: Diagnosis not present

## 2022-01-24 DIAGNOSIS — G894 Chronic pain syndrome: Secondary | ICD-10-CM | POA: Diagnosis not present

## 2022-02-16 ENCOUNTER — Other Ambulatory Visit: Payer: Self-pay | Admitting: *Deleted

## 2022-02-16 MED ORDER — LISINOPRIL 2.5 MG PO TABS: ORAL_TABLET | ORAL | 3 refills | Status: DC

## 2022-02-24 DIAGNOSIS — E1165 Type 2 diabetes mellitus with hyperglycemia: Secondary | ICD-10-CM | POA: Diagnosis not present

## 2022-02-24 DIAGNOSIS — G4700 Insomnia, unspecified: Secondary | ICD-10-CM | POA: Diagnosis not present

## 2022-02-24 DIAGNOSIS — I2510 Atherosclerotic heart disease of native coronary artery without angina pectoris: Secondary | ICD-10-CM | POA: Diagnosis not present

## 2022-02-24 DIAGNOSIS — E559 Vitamin D deficiency, unspecified: Secondary | ICD-10-CM | POA: Diagnosis not present

## 2022-02-24 DIAGNOSIS — E782 Mixed hyperlipidemia: Secondary | ICD-10-CM | POA: Diagnosis not present

## 2022-02-24 DIAGNOSIS — I10 Essential (primary) hypertension: Secondary | ICD-10-CM | POA: Diagnosis not present

## 2022-02-24 DIAGNOSIS — S0990XA Unspecified injury of head, initial encounter: Secondary | ICD-10-CM | POA: Diagnosis not present

## 2022-02-24 DIAGNOSIS — E119 Type 2 diabetes mellitus without complications: Secondary | ICD-10-CM | POA: Diagnosis not present

## 2022-02-24 DIAGNOSIS — G894 Chronic pain syndrome: Secondary | ICD-10-CM | POA: Diagnosis not present

## 2022-02-24 LAB — BASIC METABOLIC PANEL
BUN: 11 (ref 4–21)
CO2: 22 (ref 13–22)
Chloride: 99 (ref 99–108)
Creatinine: 0.9 (ref 0.6–1.3)
Glucose: 132
Potassium: 3.6 mEq/L (ref 3.5–5.1)
Sodium: 138 (ref 137–147)

## 2022-02-24 LAB — HEPATIC FUNCTION PANEL
ALT: 18 U/L (ref 10–40)
AST: 17 (ref 14–40)
Alkaline Phosphatase: 92 (ref 25–125)
Bilirubin, Total: 0.4

## 2022-02-24 LAB — TSH: TSH: 1.36 (ref 0.41–5.90)

## 2022-02-24 LAB — COMPREHENSIVE METABOLIC PANEL
Albumin: 4.1 (ref 3.5–5.0)
Calcium: 9.3 (ref 8.7–10.7)
Globulin: 2.5

## 2022-02-24 LAB — VITAMIN D 25 HYDROXY (VIT D DEFICIENCY, FRACTURES): Vit D, 25-Hydroxy: 35.9

## 2022-02-25 LAB — LIPID PANEL
Cholesterol: 180 (ref 0–200)
HDL: 33 — AB (ref 35–70)
LDL Cholesterol: 86
Triglycerides: 373 — AB (ref 40–160)

## 2022-03-22 ENCOUNTER — Ambulatory Visit: Payer: Medicare Other | Admitting: "Endocrinology

## 2022-03-25 DIAGNOSIS — E782 Mixed hyperlipidemia: Secondary | ICD-10-CM | POA: Diagnosis not present

## 2022-03-25 DIAGNOSIS — E1159 Type 2 diabetes mellitus with other circulatory complications: Secondary | ICD-10-CM | POA: Diagnosis not present

## 2022-03-25 DIAGNOSIS — G894 Chronic pain syndrome: Secondary | ICD-10-CM | POA: Diagnosis not present

## 2022-03-25 DIAGNOSIS — G4700 Insomnia, unspecified: Secondary | ICD-10-CM | POA: Diagnosis not present

## 2022-03-25 DIAGNOSIS — M1991 Primary osteoarthritis, unspecified site: Secondary | ICD-10-CM | POA: Diagnosis not present

## 2022-03-26 LAB — LIPID PANEL
Chol/HDL Ratio: 4.9 ratio (ref 0.0–5.0)
Cholesterol, Total: 178 mg/dL (ref 100–199)
HDL: 36 mg/dL — ABNORMAL LOW (ref >39)
LDL Chol Calc (NIH): 75 mg/dL (ref 0–99)
Triglycerides: 425 mg/dL — ABNORMAL HIGH (ref 0–149)
VLDL Cholesterol Cal: 67 mg/dL — ABNORMAL HIGH (ref 5–40)

## 2022-03-26 LAB — COMPREHENSIVE METABOLIC PANEL
ALT: 20 IU/L (ref 0–44)
AST: 14 IU/L (ref 0–40)
Albumin/Globulin Ratio: 1.8 (ref 1.2–2.2)
Albumin: 4.4 g/dL (ref 3.8–4.9)
Alkaline Phosphatase: 89 IU/L (ref 44–121)
BUN/Creatinine Ratio: 18 (ref 9–20)
BUN: 14 mg/dL (ref 6–24)
Bilirubin Total: 0.4 mg/dL (ref 0.0–1.2)
CO2: 23 mmol/L (ref 20–29)
Calcium: 9.7 mg/dL (ref 8.7–10.2)
Chloride: 98 mmol/L (ref 96–106)
Creatinine, Ser: 0.8 mg/dL (ref 0.76–1.27)
Globulin, Total: 2.5 g/dL (ref 1.5–4.5)
Glucose: 143 mg/dL — ABNORMAL HIGH (ref 70–99)
Potassium: 4.5 mmol/L (ref 3.5–5.2)
Sodium: 142 mmol/L (ref 134–144)
Total Protein: 6.9 g/dL (ref 6.0–8.5)
eGFR: 103 mL/min/{1.73_m2} (ref >59)

## 2022-03-26 LAB — T4, FREE: Free T4: 1.15 ng/dL (ref 0.82–1.77)

## 2022-03-26 LAB — TSH: TSH: 1.63 u[IU]/mL (ref 0.450–4.500)

## 2022-04-05 DIAGNOSIS — M25511 Pain in right shoulder: Secondary | ICD-10-CM | POA: Diagnosis not present

## 2022-04-05 DIAGNOSIS — M25512 Pain in left shoulder: Secondary | ICD-10-CM | POA: Diagnosis not present

## 2022-04-05 DIAGNOSIS — G8929 Other chronic pain: Secondary | ICD-10-CM | POA: Diagnosis not present

## 2022-04-05 DIAGNOSIS — M7581 Other shoulder lesions, right shoulder: Secondary | ICD-10-CM | POA: Diagnosis not present

## 2022-04-05 DIAGNOSIS — M75101 Unspecified rotator cuff tear or rupture of right shoulder, not specified as traumatic: Secondary | ICD-10-CM | POA: Diagnosis not present

## 2022-04-07 ENCOUNTER — Encounter: Payer: Self-pay | Admitting: "Endocrinology

## 2022-04-07 ENCOUNTER — Ambulatory Visit (INDEPENDENT_AMBULATORY_CARE_PROVIDER_SITE_OTHER): Payer: Medicare Other | Admitting: "Endocrinology

## 2022-04-07 ENCOUNTER — Telehealth: Payer: Self-pay | Admitting: "Endocrinology

## 2022-04-07 VITALS — BP 116/80 | HR 64 | Ht 73.0 in | Wt 215.4 lb

## 2022-04-07 DIAGNOSIS — I10 Essential (primary) hypertension: Secondary | ICD-10-CM

## 2022-04-07 DIAGNOSIS — E782 Mixed hyperlipidemia: Secondary | ICD-10-CM

## 2022-04-07 DIAGNOSIS — E1159 Type 2 diabetes mellitus with other circulatory complications: Secondary | ICD-10-CM

## 2022-04-07 DIAGNOSIS — F17200 Nicotine dependence, unspecified, uncomplicated: Secondary | ICD-10-CM | POA: Diagnosis not present

## 2022-04-07 LAB — POCT GLYCOSYLATED HEMOGLOBIN (HGB A1C): HbA1c, POC (controlled diabetic range): 8.2 % — AB (ref 0.0–7.0)

## 2022-04-07 MED ORDER — LANTUS SOLOSTAR 100 UNIT/ML ~~LOC~~ SOPN: 50.0000 [IU] | PEN_INJECTOR | Freq: Every day | SUBCUTANEOUS | 1 refills | Status: DC

## 2022-04-07 MED ORDER — INSULIN PEN NEEDLE 31G X 5 MM MISC: 1 refills | Status: DC

## 2022-04-07 MED ORDER — ACCU-CHEK GUIDE ME W/DEVICE KIT: 1.0000 | PACK | 0 refills | Status: DC

## 2022-04-07 MED ORDER — ACCU-CHEK GUIDE VI STRP: ORAL_STRIP | 2 refills | Status: DC

## 2022-04-07 NOTE — Telephone Encounter (Signed)
New message    1. Which medications need to be refilled? (please list name of each medication and dose if known) Insulin Pen Needle 31G X 5 MM MISC  2. Which pharmacy/location (including street and city if local pharmacy) is medication to be sent to?[facility] in [place]   3. Do they need a 30 day or 90 day supply? 30 day supply

## 2022-04-07 NOTE — Progress Notes (Signed)
04/07/2022, 4:07 PM  Endocrinology follow-up note   Subjective:    Patient ID: [name], male    DOB: 07/10/1966.  [name] is being seen in follow-up after he was seen in consultation for management of currently uncontrolled symptomatic diabetes requested by  Sharilyn Sites, MD.   Past Medical History:  Diagnosis Date   Anxiety    Borderline diabetes    Bulging disc    Chronic back pain    Closed intra-articular fracture of distal end of left tibia    Diabetes mellitus without complication ([place])    HTN (hypertension)    Hypercholesteremia    Myocardial infarction [facility])    STEMI 2015 with BMS to RCA   Pancreatitis     Past Surgical History:  Procedure Laterality Date   ABDOMINAL SURGERY  1990   adhesions, blockage.    APPENDECTOMY     BACK SURGERY     CARDIAC CATHETERIZATION  2003   "trivial coronary artery disease," normal EF   COLONOSCOPY N/A 04/16/2015   Procedure: COLONOSCOPY;  Surgeon: Daneil Dolin, MD;  [facility];  Service: Endoscopy;  Laterality: N/A;  12:45 PM   COLONOSCOPY WITH PROPOFOL N/A 06/28/2018   [place]: Diverticulosis, 2 tubular adenomas removed.  Next colonoscopy in 5 years.   LEFT HEART CATH AND CORONARY ANGIOGRAPHY N/A 05/09/2017   Procedure: LEFT HEART CATH AND CORONARY ANGIOGRAPHY;  Surgeon: Leonie Man, MD;  Location: [place] CV LAB;  Service: Cardiovascular;  Laterality: N/A;   LEFT HEART CATHETERIZATION WITH CORONARY ANGIOGRAM N/A 03/13/2014   Procedure: LEFT HEART CATHETERIZATION WITH CORONARY ANGIOGRAM;  Surgeon: Leonie Man, MD;  Location: [facility] CATH LAB;  Service: Cardiovascular;  Laterality: N/A;   OPEN REDUCTION INTERNAL FIXATION (ORIF) TIBIA/FIBULA FRACTURE Left 10/01/2018   Procedure: OPEN REDUCTION INTERNAL FIXATION LEFT PILON FRACTURE;  Surgeon: Altamese Pelzer, MD;  Location: [place];  Service: Orthopedics;  Laterality: Left;   POLYPECTOMY  06/28/2018   Procedure: POLYPECTOMY;  Surgeon:  Daneil Dolin, MD;  [facility];  Service: Endoscopy;;  (colon)    Social History   Socioeconomic History   Marital status: Single    Spouse name: Not on file   Number of children: Not on file   Years of education: Not on file   Highest education level: Not on file  Occupational History   Not on file  Tobacco Use   Smoking status: Every Day    Packs/day: 0.50    Years: 30.00    Total pack years: 15.00    Types: Cigarettes    Start date: 10/25/1977   Smokeless tobacco: Never  Vaping Use   Vaping Use: Never used  Substance and Sexual Activity   Alcohol use: Not Currently    Alcohol/week: 6.0 standard drinks of alcohol    Types: 6 Cans of beer per week    Comment: denied 06/08/21   Drug use: No   Sexual activity: Yes  Other Topics Concern   Not on file  Social History Narrative   Not on file   Social Determinants of Health   Financial Resource Strain: Not on file  Food Insecurity: Not on file  Transportation Needs: Not on file  Physical Activity: Not on file  Stress: Not on file  Social Connections: Not on file    Family History  Problem Relation Age of Onset   Hypertension Mother    Hyperlipidemia Mother    Diabetes  Father    Hyperlipidemia Father    Hypertension Father    Hyperlipidemia Sister    Hypertension Sister    Diabetes Sister    Colon cancer Paternal Uncle        over age 25    Outpatient Encounter Medications as of 04/07/2022  Medication Sig   Blood Glucose Monitoring Suppl (ACCU-CHEK GUIDE ME) w/Device KIT 1 Piece by Does not apply route as directed.   glucose blood (ACCU-CHEK GUIDE) test strip Use to monitor glucose 2 times daily as  instructed   Albuterol Sulfate (PROAIR HFA IN) Inhale 2 puffs into the lungs as needed.   aspirin 81 MG chewable tablet Chew 1 tablet (81 mg total) by mouth daily.   atorvastatin (LIPITOR) 80 MG tablet Take 1 tablet (80 mg total) by mouth at bedtime.   CRANBERRY PO Take 1 tablet by mouth every 14  (fourteen) days.   furosemide (LASIX) 20 MG tablet Take 1 tablet (20 mg total) by mouth daily as needed.   ibuprofen (ADVIL,MOTRIN) 200 MG tablet Take 800 mg by mouth every 6 (six) hours as needed for fever, headache or mild pain.    Insulin Pen Needle 31G X 5 MM MISC Use to inject insulin   isosorbide mononitrate (IMDUR) 60 MG 24 hr tablet TAKE 1 TABLET BY MOUTH DAILY   LANTUS SOLOSTAR 100 UNIT/ML Solostar Pen Inject 50 Units into the skin at bedtime.   linaclotide (LINZESS) 72 MCG capsule Take 1 capsule (72 mcg total) by mouth daily before breakfast.   lisinopril (ZESTRIL) 2.5 MG tablet TAKE 1 TABLET(2.5 MG) BY MOUTH DAILY   metFORMIN (GLUCOPHAGE) 1000 MG tablet Take 1,000 mg by mouth 2 (two) times daily.   Multiple Vitamin (MULTIVITAMIN) tablet Take 1 tablet by mouth daily.   nitroGLYCERIN (NITROSTAT) 0.4 MG SL tablet Place 1 tablet (0.4 mg total) under the tongue every 5 (five) minutes x 3 doses as needed for chest pain (if no relief after 2nd dose, proceed to the ED or call 911).   Omega-3 Fatty Acids (SALMON OIL-1000 PO) Take 1 capsule by mouth daily.   oxyCODONE-acetaminophen (PERCOCET) 5-325 MG tablet Take 1 tablet by mouth every 6 (six) hours as needed.   zolpidem (AMBIEN) 10 MG tablet Take 10 mg by mouth at bedtime as needed.   [DISCONTINUED] Insulin Pen Needle 31G X 5 MM MISC Use to inject insulin   [DISCONTINUED] LANTUS SOLOSTAR 100 UNIT/ML Solostar Pen ADMINISTER 40 UNITS UNDER THE SKIN AT BEDTIME   No facility-administered encounter medications on file as of 04/07/2022.    ALLERGIES: No Known Allergies  VACCINATION STATUS: Immunization History  Administered Date(s) Administered   Influenza,inj,Quad PF,6+ Mos 05/27/2014   Tdap 05/29/2021    Diabetes He presents for his follow-up diabetic visit. He has type 2 diabetes mellitus. Onset time: He was diagnosed at approximate age of 56 years. His disease course has been worsening. There are no hypoglycemic associated symptoms.  Pertinent negatives for hypoglycemia include no confusion, headaches, pallor or seizures. Pertinent negatives for diabetes include no blurred vision, no chest pain, no fatigue, no polydipsia, no polyphagia, no polyuria and no weakness. There are no hypoglycemic complications. Symptoms are worsening. Diabetic complications include heart disease. (He is status post stent placement in 1 large coronary artery.) Risk factors for coronary artery disease include dyslipidemia, diabetes mellitus, family history, hypertension, male sex, sedentary lifestyle and tobacco exposure. Current diabetic treatment includes insulin injections (He is currently on Lantus 14 units nightly, metformin 1000 mg p.o. twice  daily.). His weight is fluctuating minimally. He is following a generally unhealthy diet. When asked about meal planning, he reported none. He has not had a previous visit with a dietitian. He never participates in exercise. His home blood glucose trend is increasing steadily. (He did not bring any logs nor meter.  His point-of-care A1c is increasing to 8.2% from 7%.  He did not document or report hypoglycemia.  ) An ACE inhibitor/angiotensin II receptor blocker is being taken. Eye exam is current.  Hyperlipidemia This is a chronic problem. The problem is resistant. Exacerbating diseases include diabetes. Pertinent negatives include no chest pain, myalgias or shortness of breath. Current antihyperlipidemic treatment includes statins. Risk factors for coronary artery disease include dyslipidemia, diabetes mellitus, family history, male sex, hypertension and a sedentary lifestyle.  Hypertension This is a chronic problem. The current episode started more than 1 year ago. Pertinent negatives include no blurred vision, chest pain, headaches, neck pain, palpitations or shortness of breath. Risk factors for coronary artery disease include dyslipidemia, diabetes mellitus, smoking/tobacco exposure, sedentary lifestyle, male  gender and family history. Past treatments include ACE inhibitors.     Review of Systems  Constitutional:  Negative for chills, fatigue, fever and unexpected weight change.  HENT:  Negative for dental problem, mouth sores and trouble swallowing.   Eyes:  Negative for blurred vision and visual disturbance.  Respiratory:  Negative for cough, choking, chest tightness, shortness of breath and wheezing.   Cardiovascular:  Negative for chest pain, palpitations and leg swelling.  Gastrointestinal:  Negative for abdominal distention, abdominal pain, constipation, diarrhea, nausea and vomiting.  Endocrine: Negative for polydipsia, polyphagia and polyuria.  Genitourinary:  Negative for dysuria, flank pain, hematuria and urgency.  Musculoskeletal:  Negative for back pain, gait problem, myalgias and neck pain.  Skin:  Negative for pallor, rash and wound.  Neurological:  Negative for seizures, syncope, weakness, numbness and headaches.  Psychiatric/Behavioral:  Negative for confusion and dysphoric mood.     Objective:       04/07/2022    1:07 PM 12/21/2021   11:22 AM 12/16/2021   10:11 AM  Vitals with BMI  Height _0  _1  _2   Weight 215 lbs 6 oz 219 lbs 10 oz 219 lbs 10 oz  BMI 28.42 03.70 48.88  Systolic 916 945 038  Diastolic 80 70 78  Pulse 64 66 92    BP 116/80   Pulse 64   Ht _3  (1.854 m)   Wt 215 lb 6.4 oz (97.7 kg)   BMI 28.42 kg/m   Wt Readings from Last 3 Encounters:  04/07/22 215 lb 6.4 oz (97.7 kg)  12/21/21 219 lb 9.6 oz (99.6 kg)  12/16/21 219 lb 9.6 oz (99.6 kg)      CMP ( most recent) CMP     Component Value Date/Time   NA 142 03/25/2022 1102   K 4.5 03/25/2022 1102   CL 98 03/25/2022 1102   CO2 23 03/25/2022 1102   GLUCOSE 143 (H) 03/25/2022 1102   GLUCOSE 134 (H) 05/29/2021 1635   BUN 14 03/25/2022 1102   CREATININE 0.80 03/25/2022 1102   CALCIUM 9.7 03/25/2022 1102   PROT 6.9 03/25/2022 1102   ALBUMIN 4.4 03/25/2022 1102   AST 14 03/25/2022  1102   ALT 20 03/25/2022 1102   ALKPHOS 89 03/25/2022 1102   BILITOT 0.4 03/25/2022 1102   GFRNONAA >60 05/29/2021 1620   GFRAA >60 02/06/2020 0035     Diabetic Labs (most recent):  Lab Results  Component Value Date   HGBA1C 8.2 (A) 04/07/2022   HGBA1C 7.9 (A) 12/16/2021   HGBA1C 7.0 07/30/2021     Lipid Panel ( most recent) Lipid Panel     Component Value Date/Time   CHOL 178 03/25/2022 1102   TRIG 425 (H) 03/25/2022 1102   HDL 36 (L) 03/25/2022 1102   CHOLHDL 4.9 03/25/2022 1102   CHOLHDL 17.3 01/20/2014 0528   VLDL UNABLE TO CALCULATE IF TRIGLYCERIDE OVER 400 mg/dL 01/20/2014 0528   LDLCALC 75 03/25/2022 1102   LABVLDL 67 (H) 03/25/2022 1102      Lab Results  Component Value Date   TSH 1.630 03/25/2022   TSH 1.36 02/24/2022   TSH 1.120 07/26/2021   TSH 1.100 01/19/2014   FREET4 1.15 03/25/2022   FREET4 1.37 07/26/2021      Assessment & Plan:   1. Type 2 diabetes mellitus with vascular disease ([place])   - [name] has currently uncontrolled symptomatic type 2 DM since  56 years of age.  He did not bring any logs nor meter.  His point-of-care A1c is increasing to 8.2% from 7%.  He did not document or report hypoglycemia.   - I had a long discussion with him about the progressive nature of diabetes and the pathology behind its complications. -his diabetes is complicated by coronary artery disease, chronic heavy smoking, sedentary life and he remains at a high risk for more acute and chronic complications which include CAD, CVA, CKD, retinopathy, and neuropathy. These are all discussed in detail with him.  - I have counseled him on diet  and weight management  by adopting a carbohydrate restricted/protein rich diet. Patient is encouraged to switch to  unprocessed or minimally processed     complex starch and increased protein intake (animal or plant source), fruits, and vegetables. -  he is advised to stick to a routine mealtimes to eat 3 meals  a day and avoid  unnecessary snacks ( to snack only to correct hypoglycemia).    - he acknowledges that there is a room for improvement in his food and drink choices. - Suggestion is made for him to avoid simple carbohydrates  from his diet including Cakes, Sweet Desserts, Ice Cream, Soda (diet and regular), Sweet Tea, Candies, Chips, Cookies, Store Bought Juices, Alcohol , Artificial Sweeteners,  Coffee Creamer, and "Sugar-free" Products, Lemonade. This will help patient to have more stable blood glucose profile and potentially avoid unintended weight gain.  The following Lifestyle Medicine recommendations according to [place]  [facility]) were discussed and and offered to patient and he  agrees to start the journey:  A. Whole Foods, Plant-Based Nutrition comprising of fruits and vegetables, plant-based proteins, whole-grain carbohydrates was discussed in detail with the patient.   A list for source of those nutrients were also provided to the patient.  Patient will use only water or unsweetened tea for hydration. B.  The need to stay away from risky substances including alcohol, smoking; obtaining 7 to 9 hours of restorative sleep, at least 150 minutes of moderate intensity exercise weekly, the importance of healthy social connections,  and stress management techniques were discussed. C.  A full color page of  Calorie density of various food groups per pound showing examples of each food groups was provided to the patient.    - he will be scheduled with Jearld Fenton, RDN, CDE for diabetes education.  - I have approached him with the following  individualized plan to manage  his diabetes and patient agrees:   -In light of his presentation with loss of control, he may need higher dose of insulin, however, he is not committed for monitoring.  He is approached to start monitoring blood glucose at least twice a day-before breakfast and at bedtime and return with his meter and logs next  visit.    In the meantime, he is advised to increase his  Lantus to 50 units nightly, continue metformin 1000 mg p.o. twice daily.    - he is warned not to take insulin without proper monitoring per orders.  - he is encouraged to call clinic for blood glucose levels less than 70 or above 200 mg /dl.  -Reportedly, he did not tolerate Jardiance, Farxiga in the past and he does not want to start any of those medications. -He has improved triglycerides of 224 from 1132.  He will be considered for GLP-1 receptor agonists if necessary for diabetes management on subsequent visits.   - Specific targets for  A1c;  LDL, HDL,  and Triglycerides were discussed with the patient.  2) Blood Pressure /Hypertension:  -His blood pressure is controlled to target  .  he is advised to continue his current medications including lisinopril 2.5 mg p.o. daily with breakfast . 3) Lipids/Hyperlipidemia:   Review of his recent lipid panel showed uncontrolled LDL at 111 although it is improving overall from undetectable LDL due to severe hypertriglyceridemia of 2000+.   His Trigs now at 425.   He is advised to continue Lipitor 40 mg p.o. nightly.  Side effects and precautions discussed with him.  Whole food plant-based diet will help with dyslipidemia as well.  4)  Weight/Diet:  Body mass index is 28.42 kg/m.  -      he is  a candidate for weight loss. I discussed with him the fact that loss of 5 - 10% of his  current body weight will have the most impact on his diabetes management.  Exercise, and detailed carbohydrates information provided  -  detailed on discharge instructions.  5) Chronic Care/Health Maintenance: 6) chronic smoker  -he  is on ACEI/ARB and Statin medications and  is encouraged to initiate and continue to follow up with Ophthalmology, Dentist,  Podiatrist at least yearly or according to recommendations, and advised to  quit smoking. I have recommended yearly flu vaccine and pneumonia vaccine at least  every 5 years; moderate intensity exercise for up to 150 minutes weekly; and  sleep for at least 7 hours a day.  -He is started on low-dose vitamin D3 2000 units daily. The patient was counseled on the dangers of tobacco use, and was advised to quit.  Reviewed strategies to maximize success, including removing cigarettes and smoking materials from environment.    - he is  advised to maintain close follow up with Sharilyn Sites, MD for primary care needs, as well as his other providers for optimal and coordinated care.    I spent 34 minutes in the care of the patient today including review of labs from [place], Lipids, Thyroid Function, Hematology (current and previous including abstractions from other facilities); face-to-face time discussing  his blood glucose readings/logs, discussing hypoglycemia and hyperglycemia episodes and symptoms, medications doses, his options of short and long term treatment based on the latest standards of care / guidelines;  discussion about incorporating lifestyle medicine;  and documenting the encounter. Risk reduction counseling performed per USPSTF guidelines to reduce  cardiovascular risk factors.  Please refer to Patient Instructions for Blood Glucose Monitoring and Insulin/Medications Dosing Guide"  in media tab for additional information. Please  also refer to " Patient Self Inventory" in the Media  tab for reviewed elements of pertinent patient history.  [name] participated in the discussions, expressed understanding, and voiced agreement with the above plans.  All questions were answered to his satisfaction. he is encouraged to contact clinic should he have any questions or concerns prior to his return visit.   Follow up plan: - Return in about 3 months (around 07/07/2022) for Bring Meter/CGM Device/Logs- A1c in Office.  Glade Lloyd, MD [facility] [address] [place], [place] [address] Phone:  [phone]  Fax: [phone]    04/07/2022, 4:07 PM  This note was partially dictated with voice recognition software. Similar sounding words can be transcribed inadequately or may not  be corrected upon review.

## 2022-04-07 NOTE — Telephone Encounter (Signed)
Dr.Nida sent Rx refill in.

## 2022-04-07 NOTE — Patient Instructions (Signed)

## 2022-04-13 ENCOUNTER — Telehealth: Payer: Self-pay | Admitting: Cardiology

## 2022-04-13 MED ORDER — FUROSEMIDE 20 MG PO TABS: 20.0000 mg | ORAL_TABLET | Freq: Every day | ORAL | 3 refills | Status: DC | PRN

## 2022-04-13 NOTE — Telephone Encounter (Signed)
*  STAT* If patient is at the pharmacy, call can be transferred to refill team.   1. Which medications need to be refilled? (please list name of each medication and dose if known)   furosemide (LASIX) 20 MG tablet   2. Which pharmacy/location (including street and city if local pharmacy) is medication to be sent to?  [id number] - [place], [place] [address]  3. Do they need a 30 day or 90 day supply?  90 days  Patient stated he has 1 tablet left.

## 2022-04-22 DIAGNOSIS — G894 Chronic pain syndrome: Secondary | ICD-10-CM | POA: Diagnosis not present

## 2022-05-24 DIAGNOSIS — Z23 Encounter for immunization: Secondary | ICD-10-CM | POA: Diagnosis not present

## 2022-05-24 DIAGNOSIS — G894 Chronic pain syndrome: Secondary | ICD-10-CM | POA: Diagnosis not present

## 2022-06-24 DIAGNOSIS — G4700 Insomnia, unspecified: Secondary | ICD-10-CM | POA: Diagnosis not present

## 2022-06-24 DIAGNOSIS — G894 Chronic pain syndrome: Secondary | ICD-10-CM | POA: Diagnosis not present

## 2022-06-30 ENCOUNTER — Encounter: Payer: Self-pay | Admitting: Cardiology

## 2022-06-30 ENCOUNTER — Ambulatory Visit: Payer: Medicare Other | Attending: Cardiology | Admitting: Cardiology

## 2022-06-30 VITALS — BP 100/70 | HR 71 | Ht 73.0 in | Wt 216.2 lb

## 2022-06-30 DIAGNOSIS — E782 Mixed hyperlipidemia: Secondary | ICD-10-CM

## 2022-06-30 DIAGNOSIS — I2510 Atherosclerotic heart disease of native coronary artery without angina pectoris: Secondary | ICD-10-CM

## 2022-06-30 MED ORDER — ISOSORBIDE MONONITRATE ER 60 MG PO TB24: 90.0000 mg | ORAL_TABLET | Freq: Every day | ORAL | 3 refills | Status: DC

## 2022-06-30 MED ORDER — ISOSORBIDE MONONITRATE ER 60 MG PO TB24: 60.0000 mg | ORAL_TABLET | Freq: Every day | ORAL | 3 refills | Status: DC

## 2022-06-30 NOTE — Patient Instructions (Addendum)
Medication Instructions:  Refilled Imdur today.   Continue all other medications.     Labwork: none  Testing/Procedures: none  Follow-Up: 6 months   Any Other Special Instructions Will Be Listed Below (If Applicable).   If you need a refill on your cardiac medications before your next appointment, please call your pharmacy.

## 2022-06-30 NOTE — Progress Notes (Signed)
Clinical Summary [name] is a 56 y.o.male seen today for follow up of the following medical problems.    1. CAD - admit 03/2014 with inferior STEMI, received BMS to RCA. LVEF 45-50% by LV gram. 04/2017 cath: patent coronaries, patent RCA stent. Elevated LVEDP 26  - 03/2020 nuclear stress: no ischemia     - no chest pain, no SOB/DOE - compliant with meds     2. Hyperlipidemia - tolerates pravastatin, reports prior myaglias on prior statins - elevated TGs by last labs. Was to have repeat labs but does not appear this was done. Most recent labs he reports he had coffee with whole milk prior to labs, was not a true fasting lab - muscle aches on gemfibrozil in the past, tolerated fenofibrate but had some financial issues with costs of medicines about 5 years ago and medication was stopped      01/2020 TG 1120. Has made drastic changes in his diet, reports HgbA1c down from 10 to 7. Reports upcoming labs with pcp - reports most recent labs with pcp - remains on statin, fish oil   Jan 2023 TG 224, LDL 111 03/2022 TC 178 TG 425 HDL 36 LDL 75 03/2022 A1c 8.2    Past Medical History:  Diagnosis Date   Anxiety    Borderline diabetes    Bulging disc    Chronic back pain    Closed intra-articular fracture of distal end of left tibia    Diabetes mellitus without complication (HCC)    HTN (hypertension)    Hypercholesteremia    Myocardial infarction [facility])    STEMI 2015 with BMS to RCA   Pancreatitis      No Known Allergies   Current Outpatient Medications  Medication Sig Dispense Refill   Albuterol Sulfate (PROAIR HFA IN) Inhale 2 puffs into the lungs as needed.     aspirin 81 MG chewable tablet Chew 1 tablet (81 mg total) by mouth daily.     atorvastatin (LIPITOR) 80 MG tablet Take 1 tablet (80 mg total) by mouth at bedtime. 90 tablet 1   Blood Glucose Monitoring Suppl (ACCU-CHEK GUIDE ME) w/Device KIT 1 Piece by Does not apply route as directed. 1 kit 0   CRANBERRY PO Take  1 tablet by mouth every 14 (fourteen) days.     furosemide (LASIX) 20 MG tablet Take 1 tablet (20 mg total) by mouth daily as needed. 90 tablet 3   glucose blood (ACCU-CHEK GUIDE) test strip Use to monitor glucose 2 times daily as  instructed 100 each 2   ibuprofen (ADVIL,MOTRIN) 200 MG tablet Take 800 mg by mouth every 6 (six) hours as needed for fever, headache or mild pain.      Insulin Pen Needle 31G X 5 MM MISC Use to inject insulin 100 each 1   isosorbide mononitrate (IMDUR) 60 MG 24 hr tablet TAKE 1 TABLET BY MOUTH DAILY 30 tablet 0   LANTUS SOLOSTAR 100 UNIT/ML Solostar Pen Inject 50 Units into the skin at bedtime. 30 mL 1   linaclotide (LINZESS) 72 MCG capsule Take 1 capsule (72 mcg total) by mouth daily before breakfast. 30 capsule 5   lisinopril (ZESTRIL) 2.5 MG tablet TAKE 1 TABLET(2.5 MG) BY MOUTH DAILY 90 tablet 3   metFORMIN (GLUCOPHAGE) 1000 MG tablet Take 1,000 mg by mouth 2 (two) times daily.     Multiple Vitamin (MULTIVITAMIN) tablet Take 1 tablet by mouth daily.     nitroGLYCERIN (NITROSTAT) 0.4 MG  SL tablet Place 1 tablet (0.4 mg total) under the tongue every 5 (five) minutes x 3 doses as needed for chest pain (if no relief after 2nd dose, proceed to the ED or call 911). 25 tablet 3   Omega-3 Fatty Acids (SALMON OIL-1000 PO) Take 1 capsule by mouth daily.     oxyCODONE-acetaminophen (PERCOCET) 5-325 MG tablet Take 1 tablet by mouth every 6 (six) hours as needed. 20 tablet 0   zolpidem (AMBIEN) 10 MG tablet Take 10 mg by mouth at bedtime as needed.     No current facility-administered medications for this visit.     Past Surgical History:  Procedure Laterality Date   ABDOMINAL SURGERY  1990   adhesions, blockage.    APPENDECTOMY     BACK SURGERY     CARDIAC CATHETERIZATION  2003   "trivial coronary artery disease," normal EF   COLONOSCOPY N/A 04/16/2015   Procedure: COLONOSCOPY;  Surgeon: Robert M Rourk, MD;  Location: [facility];  Service: Endoscopy;  Laterality:  N/A;  12:45 PM   COLONOSCOPY WITH PROPOFOL N/A 06/28/2018   Rourk: Diverticulosis, 2 tubular adenomas removed.  Next colonoscopy in 5 years.   LEFT HEART CATH AND CORONARY ANGIOGRAPHY N/A 05/09/2017   Procedure: LEFT HEART CATH AND CORONARY ANGIOGRAPHY;  Surgeon: Harding, David W, MD;  Location: [facility];  Service: Cardiovascular;  Laterality: N/A;   LEFT HEART CATHETERIZATION WITH CORONARY ANGIOGRAM N/A 03/13/2014   Procedure: LEFT HEART CATHETERIZATION WITH CORONARY ANGIOGRAM;  Surgeon: David W Harding, MD;  Location: [facility];  Service: Cardiovascular;  Laterality: N/A;   OPEN REDUCTION INTERNAL FIXATION (ORIF) TIBIA/FIBULA FRACTURE Left 10/01/2018   Procedure: OPEN REDUCTION INTERNAL FIXATION LEFT PILON FRACTURE;  Surgeon: Handy, Michael, MD;  Location: [facility] OR;  Service: Orthopedics;  Laterality: Left;   POLYPECTOMY  06/28/2018   Procedure: POLYPECTOMY;  Surgeon: Rourk, Robert M, MD;  Location: [facility];  Service: Endoscopy;;  (colon)     No Known Allergies    Family History  Problem Relation Age of Onset   Hypertension Mother    Hyperlipidemia Mother    Diabetes Father    Hyperlipidemia Father    Hypertension Father    Hyperlipidemia Sister    Hypertension Sister    Diabetes Sister    Colon cancer Paternal Uncle        over age 60     Social History [name] reports that he has been smoking cigarettes. He started smoking about 44 years ago. He has a 15.00 pack-year smoking history. He has never used smokeless tobacco. [name] reports that he does not currently use alcohol after a past usage of about 6.0 standard drinks of alcohol per week.   Review of Systems CONSTITUTIONAL: No weight loss, fever, chills, weakness or fatigue.  HEENT: Eyes: No visual loss, blurred vision, double vision or yellow sclerae.No hearing loss, sneezing, congestion, runny nose or sore throat.  SKIN: No rash or itching.  CARDIOVASCULAR: per hpi RESPIRATORY: No shortness of breath,  cough or sputum.  GASTROINTESTINAL: No anorexia, nausea, vomiting or diarrhea. No abdominal pain or blood.  GENITOURINARY: No burning on urination, no polyuria NEUROLOGICAL: No headache, dizziness, syncope, paralysis, ataxia, numbness or tingling in the extremities. No change in bowel or bladder control.  MUSCULOSKELETAL: No muscle, back pain, joint pain or stiffness.  LYMPHATICS: No enlarged nodes. No history of splenectomy.  PSYCHIATRIC: No history of depression or anxiety.  ENDOCRINOLOGIC: No reports of sweating, cold or heat intolerance.   No polyuria or polydipsia.  Marland Kitchen   Physical Examination Today's Vitals   06/30/22 1049  BP: 100/70  Pulse: 71  SpO2: 96%  Weight: 216 lb 3.2 oz (98.1 kg)  Height: 6' 1" (1.854 m)   Body mass index is 28.52 kg/m.  Gen: resting comfortably, no acute distress HEENT: no scleral icterus, pupils equal round and reactive, no palptable cervical adenopathy,  CV: RRR, no m/r/g no jvd Resp: Clear to auscultation bilaterally GI: abdomen is soft, non-tender, non-distended, normal bowel sounds, no hepatosplenomegaly MSK: extremities are warm, no edema.  Skin: warm, no rash Neuro:  no focal deficits Psych: appropriate affect   Diagnostic Studies  02/2017 nuclear stress No diagnostic ST segment changes to indicate ischemia. Small, mild intensity, apical to basal inferior defect is partially reversible and suggestive of ischemia, although there is gut radiotracer uptake artifact adjacent to the inferior wall on rest imaging which could also be contributing to the defect reversibility. This is a low risk study. Nuclear stress EF: 63%.     04/2017 cath   Angiographically normal coronary arteries Dist RCA BMS (VeriFlex 5.0 x 15), 0 %stenosed. The left ventricular systolic function is normal. The left ventricular ejection fraction is 50-55% by visual estimate. LV end diastolic pressure is moderately elevated.   Patient essentially has intra-graphic  normal coronary arteries with widely RCA.  No culprit lesion to explain inferior ischemia. Elevated LVEDP could explain microvascular ischemia.   Otherwise consider non-anginal etiology for chest pain.     Plan: Discharge home today after bed rest with TR band removal.   03/2020 nuclear stress No diagnostic ST segment changes to indicate ischemia. Medium sized, moderate intensity, fixed apical to basal inferior defect that is more prominent at rest and consistent with soft tissue attenuation versus scar. No substantial ischemic territories. This is a low risk study. Nuclear stress EF: 51%.   Assessment and Plan  1. CAD -no symptoms, continue current meds - EKG shows SR, no ischemic changes   2. Hyperlipidemia -LDL reasonable with recent statin change, TGs trending up as A1c trends up. Would follow for now with focus on improved glycemic control. If further rise may have to reconsider medical therapy for TGs      Arnoldo Lenis, M.D.

## 2022-07-05 ENCOUNTER — Other Ambulatory Visit: Payer: Self-pay | Admitting: Cardiology

## 2022-07-05 DIAGNOSIS — G8929 Other chronic pain: Secondary | ICD-10-CM | POA: Diagnosis not present

## 2022-07-05 DIAGNOSIS — M7581 Other shoulder lesions, right shoulder: Secondary | ICD-10-CM | POA: Diagnosis not present

## 2022-07-05 DIAGNOSIS — M25512 Pain in left shoulder: Secondary | ICD-10-CM | POA: Diagnosis not present

## 2022-07-05 DIAGNOSIS — M25511 Pain in right shoulder: Secondary | ICD-10-CM | POA: Diagnosis not present

## 2022-07-05 DIAGNOSIS — M75101 Unspecified rotator cuff tear or rupture of right shoulder, not specified as traumatic: Secondary | ICD-10-CM | POA: Diagnosis not present

## 2022-07-07 ENCOUNTER — Ambulatory Visit: Payer: Medicare Other | Admitting: "Endocrinology

## 2022-07-07 ENCOUNTER — Other Ambulatory Visit: Payer: Self-pay | Admitting: Internal Medicine

## 2022-07-07 DIAGNOSIS — K5900 Constipation, unspecified: Secondary | ICD-10-CM

## 2022-07-14 ENCOUNTER — Other Ambulatory Visit: Payer: Self-pay | Admitting: "Endocrinology

## 2022-07-22 DIAGNOSIS — G894 Chronic pain syndrome: Secondary | ICD-10-CM | POA: Diagnosis not present

## 2022-07-26 ENCOUNTER — Other Ambulatory Visit: Payer: Self-pay | Admitting: "Endocrinology

## 2022-07-27 DIAGNOSIS — M65331 Trigger finger, right middle finger: Secondary | ICD-10-CM | POA: Diagnosis not present

## 2022-07-27 DIAGNOSIS — M65332 Trigger finger, left middle finger: Secondary | ICD-10-CM | POA: Diagnosis not present

## 2022-08-21 IMAGING — CT CT HEAD W/O CM
3 series · 15 of 47 positions shown, 18 images · non-contrast
Comparison: None.

CLINICAL DATA: Status post fall. Lower back pain, abrasion to left
side of head and laceration/wound to left hear (small part of upper
ear lobe missing). Pt had a tree Fall onto his head when cutting it
down.

EXAM:
CT HEAD WITHOUT CONTRAST
CT CERVICAL SPINE WITHOUT CONTRAST
TECHNIQUE: Multidetector CT imaging of the head and cervical spine was
performed following the standard protocol without intravenous
contrast. Multiplanar CT image reconstructions of the cervical spine
were also generated.

[Series 2: head w o · axial · 0.44mm/px · z∈[+17,+147]mm · 9 of 32 slices shown, 12 images]
[im 3/32  brain]
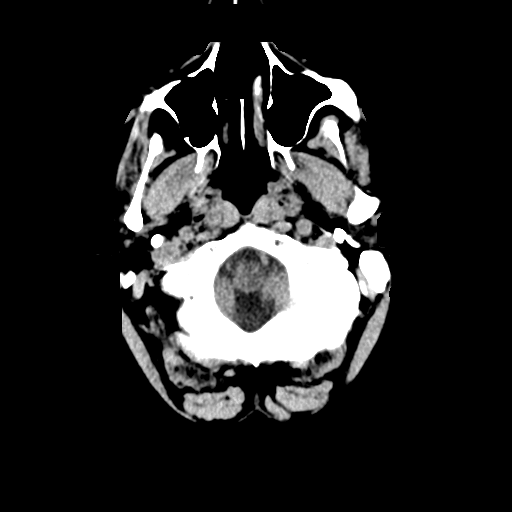
[im 3/32  bone]
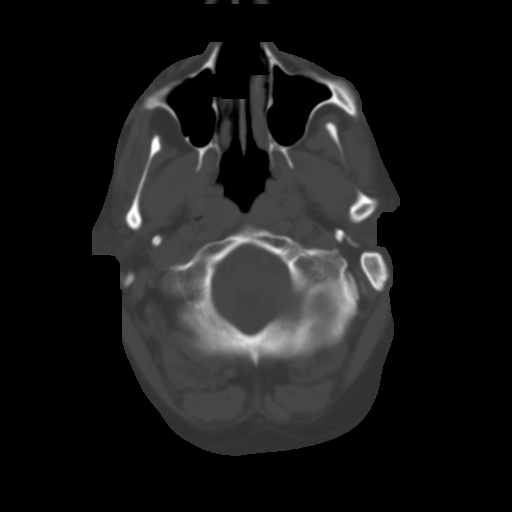
[im 6/32  brain]
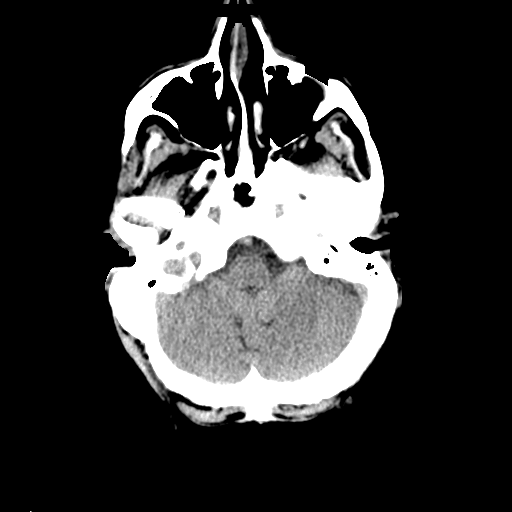
[im 9/32  brain]
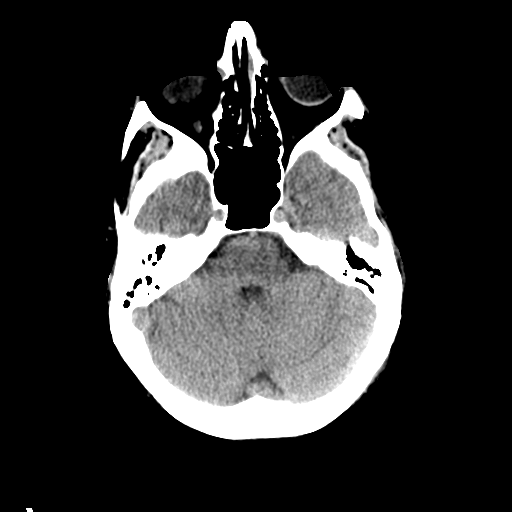
[im 12/32  brain]
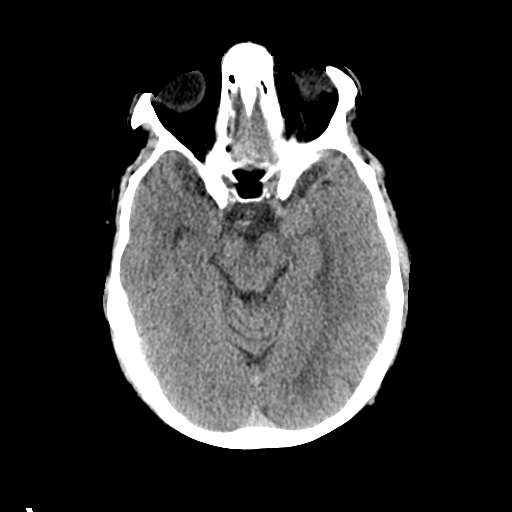
[im 17/32  brain]
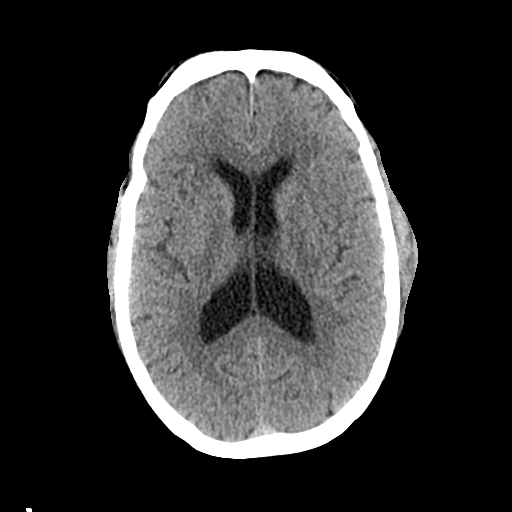
[im 17/32  bone]
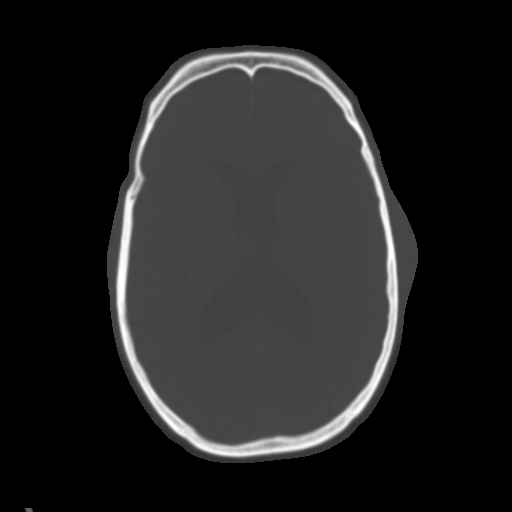
[im 20/32  brain]
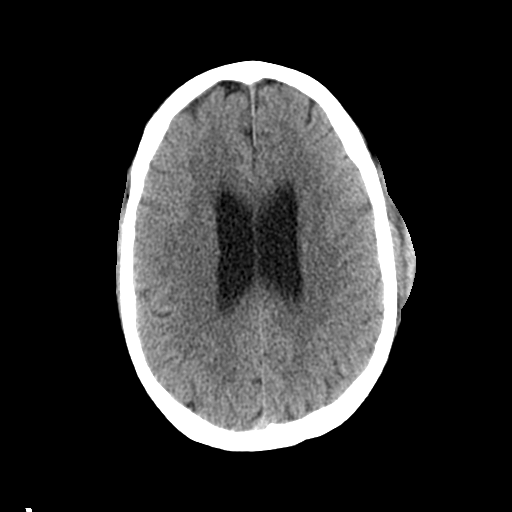
[im 23/32  brain]
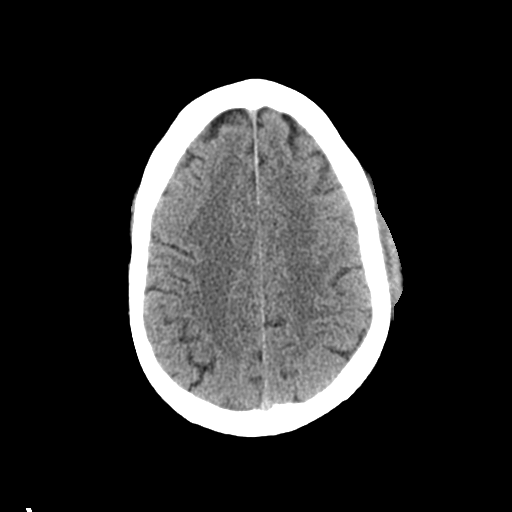
[im 26/32  brain]
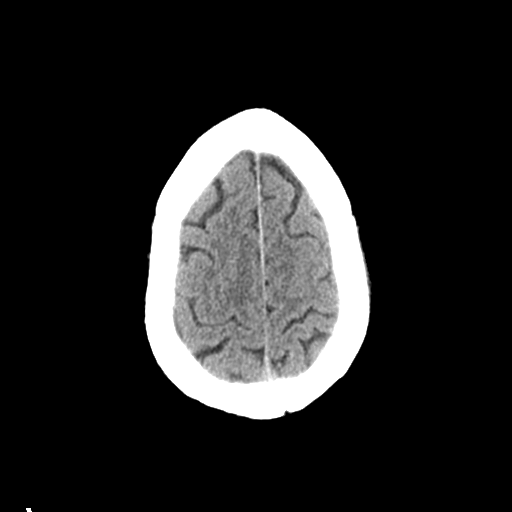
[im 29/32  brain]
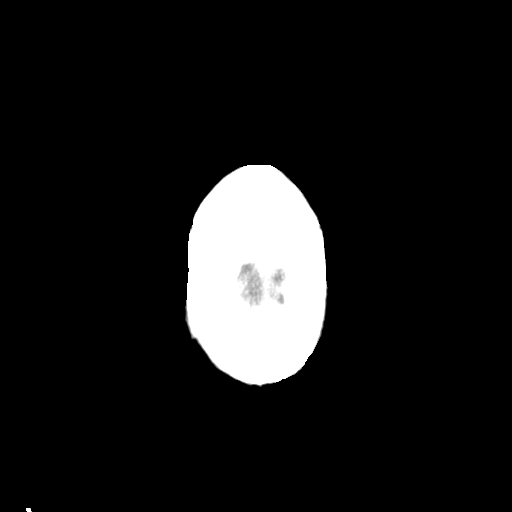
[im 29/32  bone]
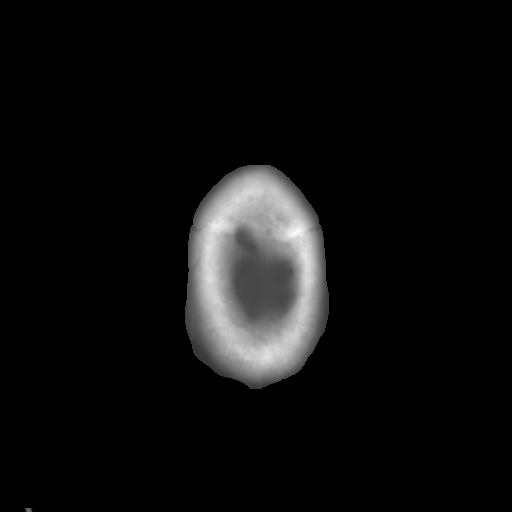

[Series 4: coronal soft · coronal · 0.32mm/px · 3 of 70 slices shown]
[im 24/70  brain]
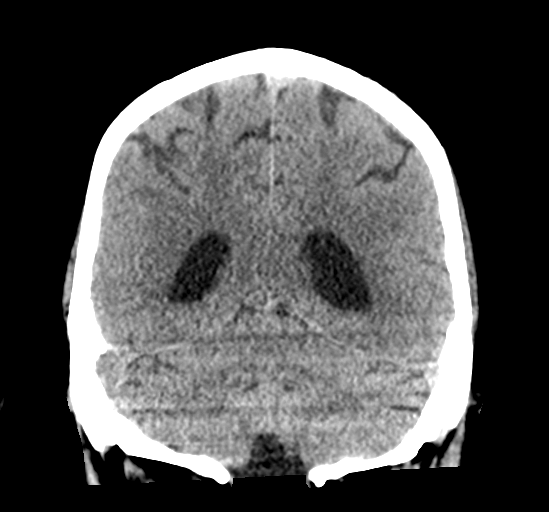
[im 31/70  brain]
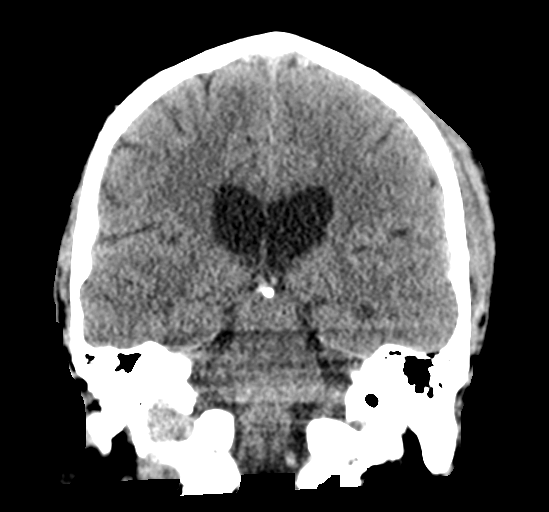
[im 39/70  brain]
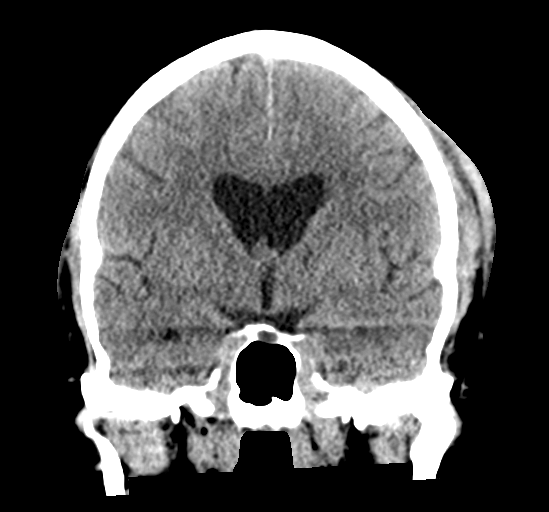

[Series 5: sagittal soft · sagittal · 0.32mm/px · 3 of 59 slices shown]
[im 20/59  brain]
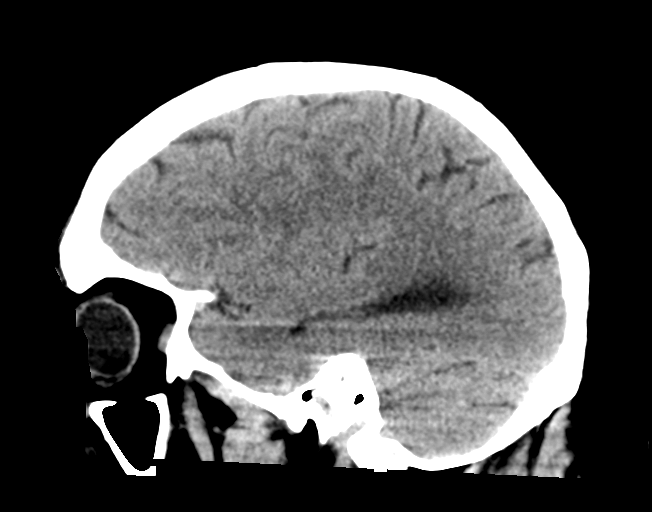
[im 30/59  brain]
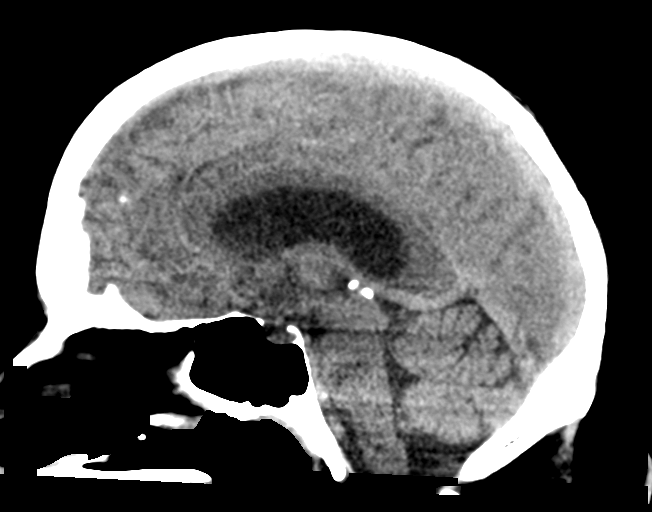
[im 39/59  brain]
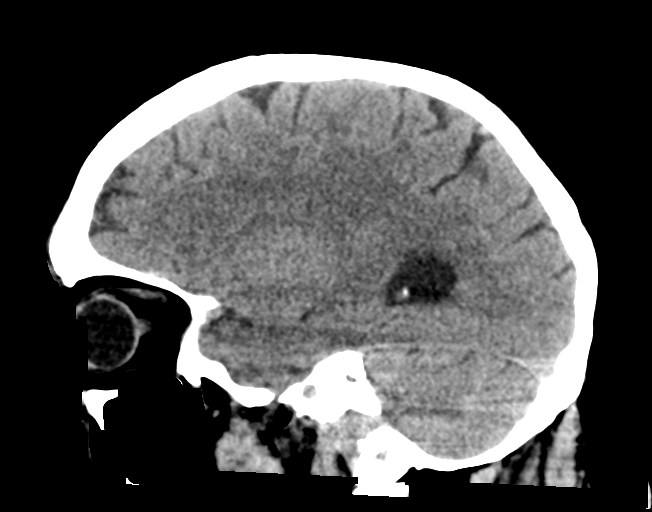

[15 of 47 positions shown; findings below may reference images not displayed]

FINDINGS: CT HEAD FINDINGS

Brain:

No evidence of large-territorial acute infarction. No parenchymal
hemorrhage. No mass lesion. No extra-axial collection.

No mass effect or midline shift. No hydrocephalus. Basilar cisterns
are patent.

Vascular: No hyperdense vessel.

Skull: No acute fracture or focal lesion.

Sinuses/Orbits: Paranasal sinuses and mastoid air cells are clear.
The orbits are unremarkable.

Other: There is an acute 7 mm left temporal subcutaneus soft tissue
hematoma formation just superficial to the temporalis muscle.

CT CERVICAL SPINE FINDINGS

Alignment: Normal.

Skull base and vertebrae: Degenerative changes at the C6-C7 level.
Associated severe osseous neural foraminal stenosis bilaterally. No
severe osseous central canal stenosis. No acute fracture. No
aggressive appearing focal osseous lesion or focal pathologic
process.

Soft tissues and spinal canal: No prevertebral fluid or swelling. No
visible canal hematoma.

Upper chest: Unremarkable.

Other: None.
IMPRESSION: 1. Acute 7 mm left temporal subcutaneus soft tissue hematoma just
superficial to the temporalis muscle.
2.  No acute intracranial abnormality.
3. No acute displaced fracture or traumatic listhesis of the
cervical spine.
4. C6-C7 degenerative changes with associated severe osseous neural
foraminal stenosis bilaterally.

## 2022-08-21 IMAGING — CT CT CERVICAL SPINE W/O CM
3 of 4 series · 11 of 33 positions shown, 13 images · non-contrast
Comparison: None.

CLINICAL DATA: Status post fall. Lower back pain, abrasion to left
side of head and laceration/wound to left hear (small part of upper
ear lobe missing). Pt had a tree Fall onto his head when cutting it
down.

EXAM:
CT HEAD WITHOUT CONTRAST
CT CERVICAL SPINE WITHOUT CONTRAST
TECHNIQUE: Multidetector CT imaging of the head and cervical spine was
performed following the standard protocol without intravenous
contrast. Multiplanar CT image reconstructions of the cervical spine
were also generated.

[Series 5: sagittal bone · sagittal · 0.30mm/px · 5 of 61 slices shown, 6 images]
[im 21/61  bone]
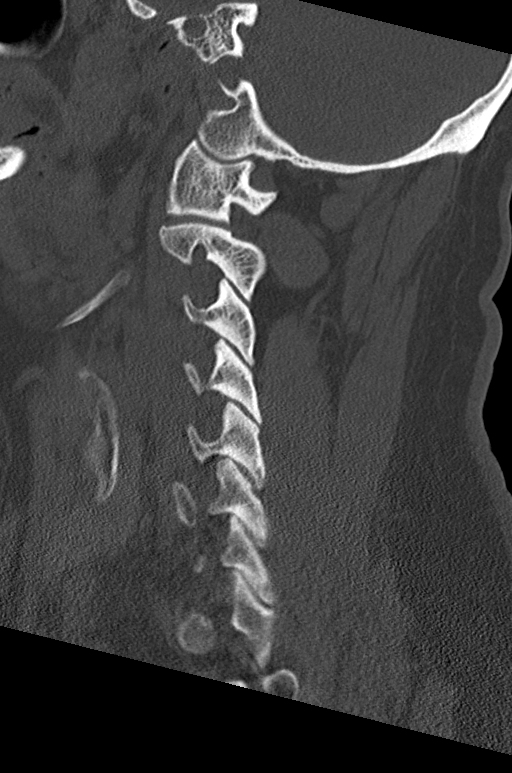
[im 26/61  bone]
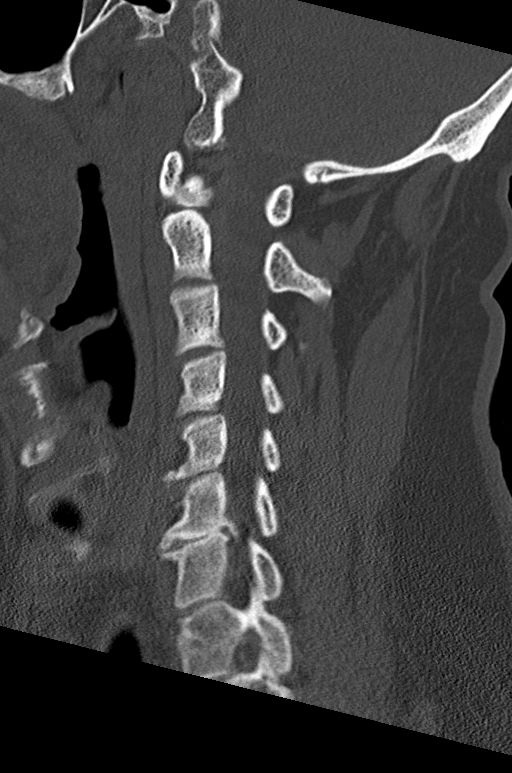
[im 31/61  soft-tissue]
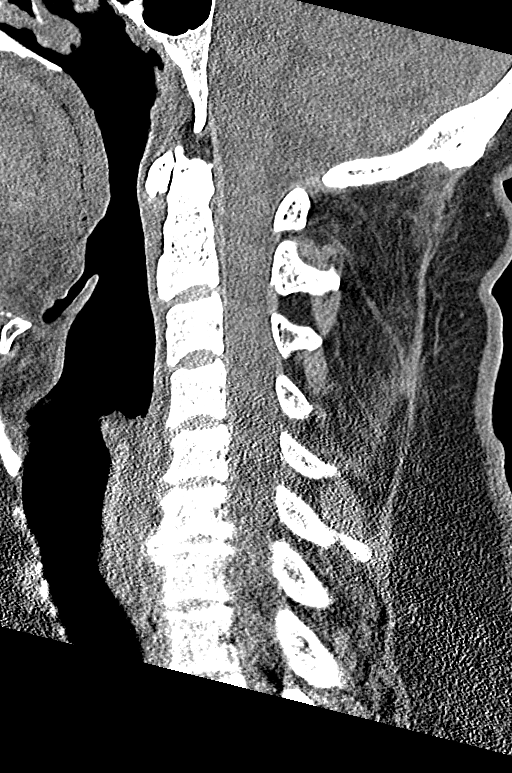
[im 31/61  bone]
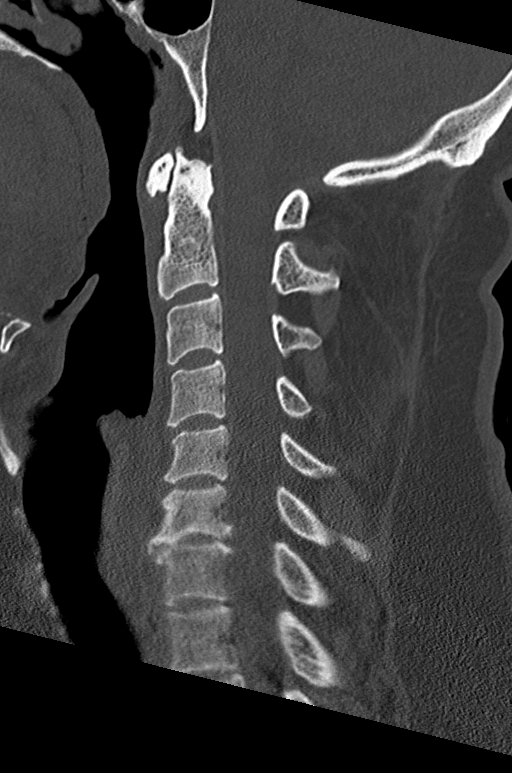
[im 36/61  bone]
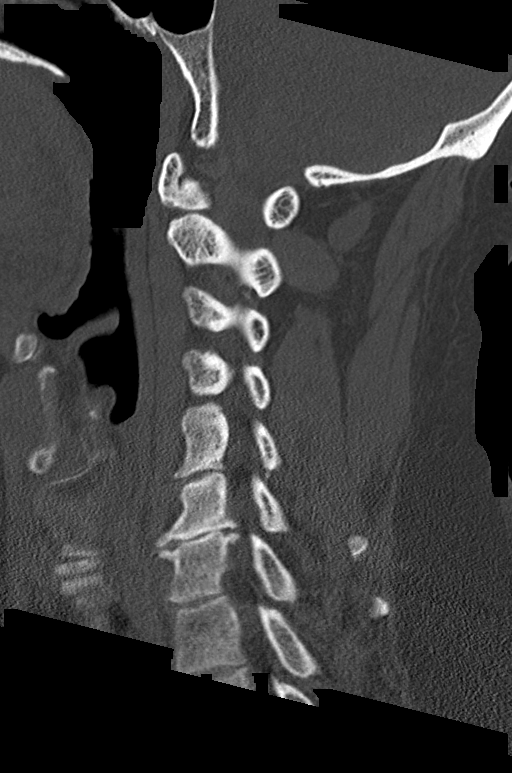
[im 41/61  bone]
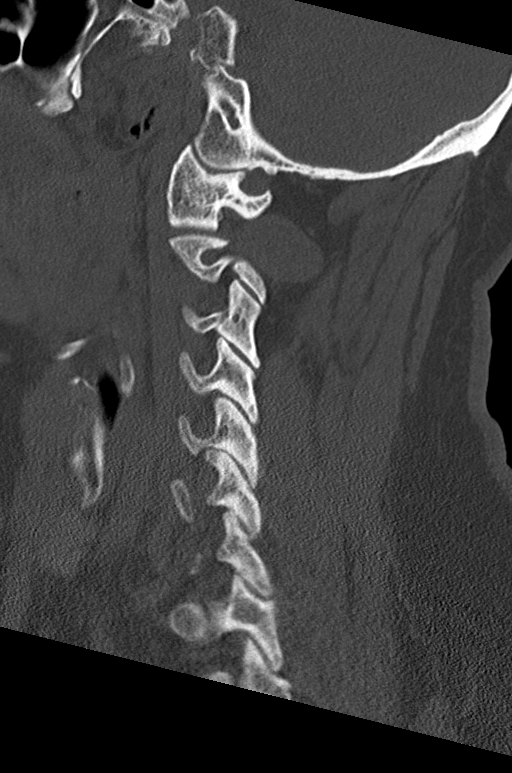

[Series 6: coronal bone · coronal · 0.23mm/px · 3 of 77 slices shown]
[im 16/77  bone]
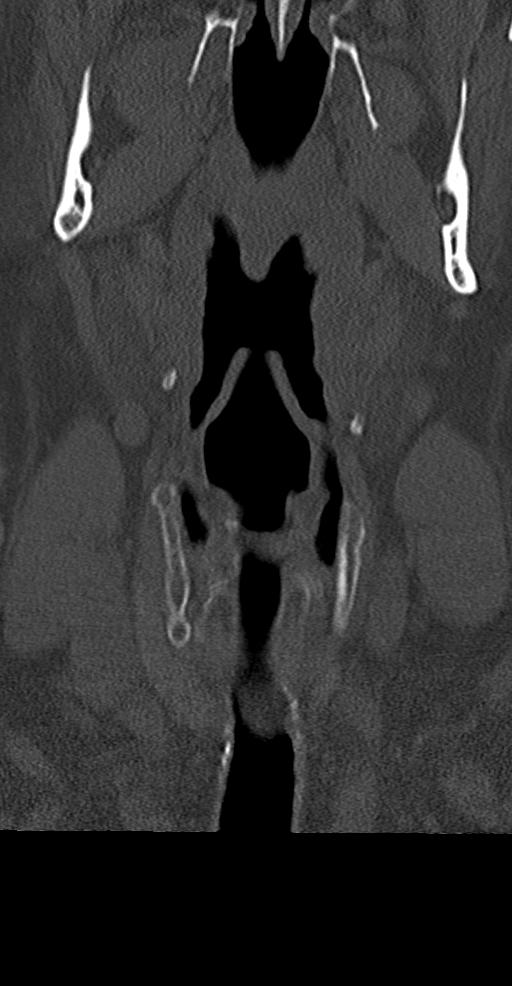
[im 31/77  bone]
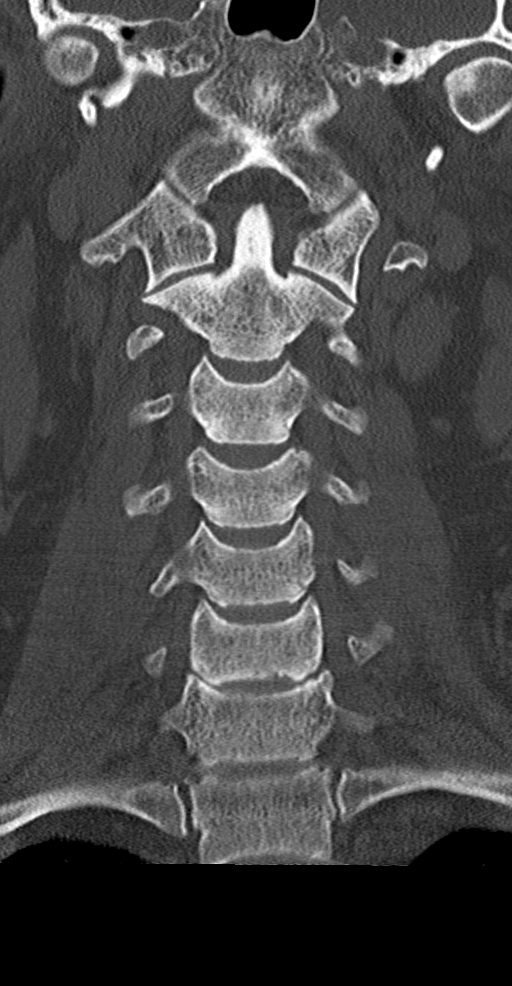
[im 46/77  bone]
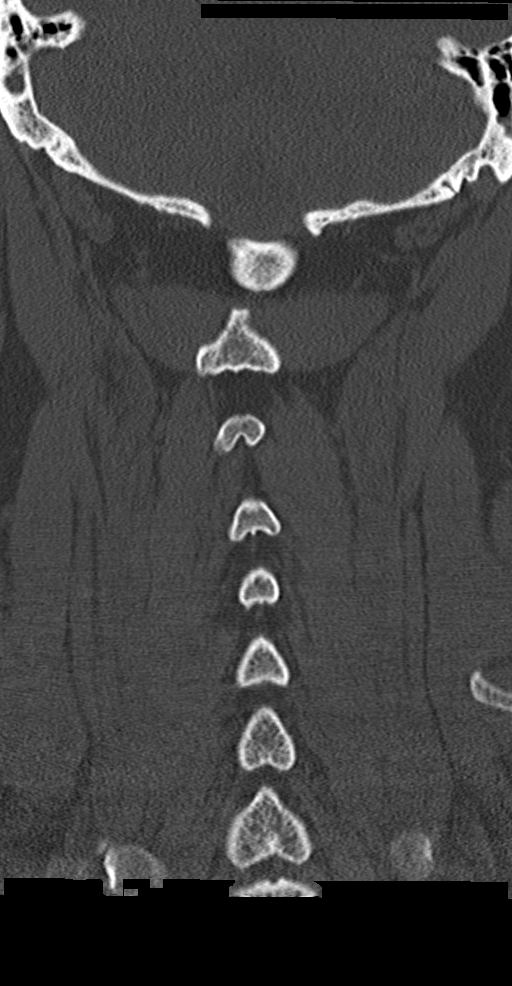

[Series 7: orthogonal axials · axial · 0.21mm/px · z∈[-147,-49]mm · 3 of 86 slices shown, 4 images]
[im 18/86  soft-tissue]
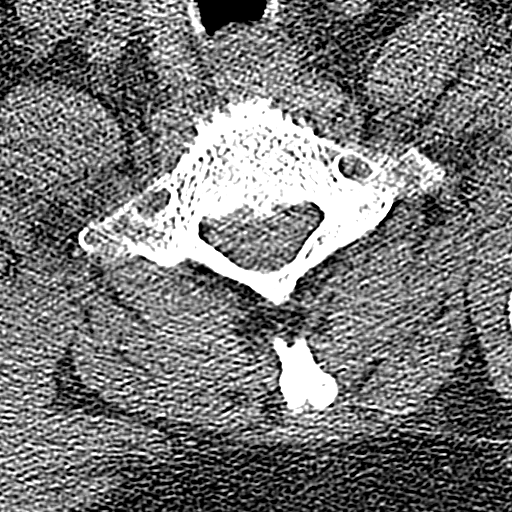
[im 18/86  bone]
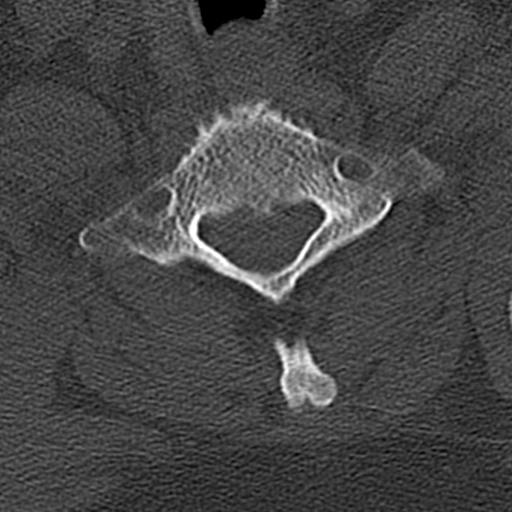
[im 52/86  bone]
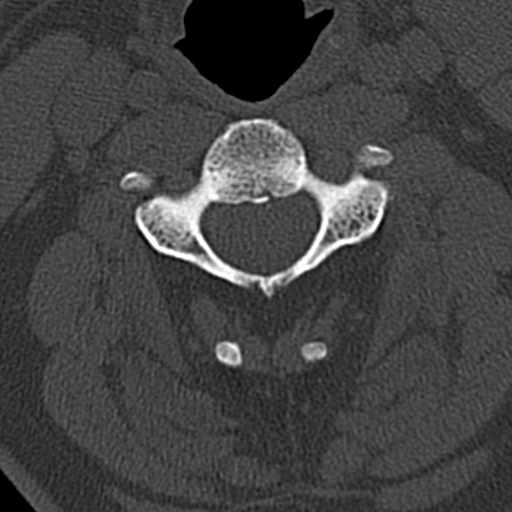
[im 69/86  bone]
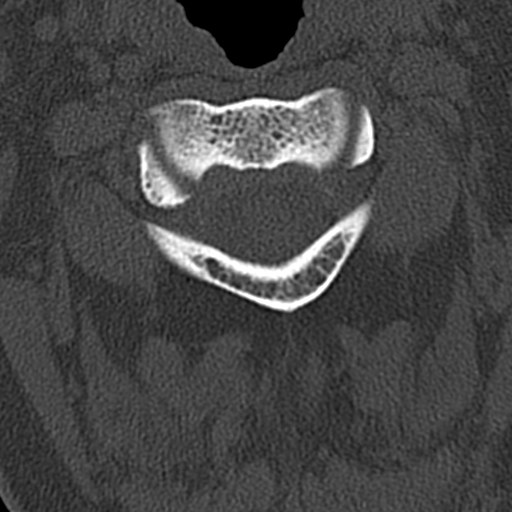

[11 of 33 positions shown; findings below may reference images not displayed]

FINDINGS: CT HEAD FINDINGS

Brain:

No evidence of large-territorial acute infarction. No parenchymal
hemorrhage. No mass lesion. No extra-axial collection.

No mass effect or midline shift. No hydrocephalus. Basilar cisterns
are patent.

Vascular: No hyperdense vessel.

Skull: No acute fracture or focal lesion.

Sinuses/Orbits: Paranasal sinuses and mastoid air cells are clear.
The orbits are unremarkable.

Other: There is an acute 7 mm left temporal subcutaneus soft tissue
hematoma formation just superficial to the temporalis muscle.

CT CERVICAL SPINE FINDINGS

Alignment: Normal.

Skull base and vertebrae: Degenerative changes at the C6-C7 level.
Associated severe osseous neural foraminal stenosis bilaterally. No
severe osseous central canal stenosis. No acute fracture. No
aggressive appearing focal osseous lesion or focal pathologic
process.

Soft tissues and spinal canal: No prevertebral fluid or swelling. No
visible canal hematoma.

Upper chest: Unremarkable.

Other: None.
IMPRESSION: 1. Acute 7 mm left temporal subcutaneus soft tissue hematoma just
superficial to the temporalis muscle.
2.  No acute intracranial abnormality.
3. No acute displaced fracture or traumatic listhesis of the
cervical spine.
4. C6-C7 degenerative changes with associated severe osseous neural
foraminal stenosis bilaterally.

## 2022-08-22 ENCOUNTER — Other Ambulatory Visit: Payer: Self-pay | Admitting: "Endocrinology

## 2022-08-22 DIAGNOSIS — G894 Chronic pain syndrome: Secondary | ICD-10-CM | POA: Diagnosis not present

## 2022-08-22 DIAGNOSIS — E782 Mixed hyperlipidemia: Secondary | ICD-10-CM | POA: Diagnosis not present

## 2022-08-22 DIAGNOSIS — D696 Thrombocytopenia, unspecified: Secondary | ICD-10-CM | POA: Diagnosis not present

## 2022-08-22 DIAGNOSIS — Z0000 Encounter for general adult medical examination without abnormal findings: Secondary | ICD-10-CM | POA: Diagnosis not present

## 2022-08-22 DIAGNOSIS — R062 Wheezing: Secondary | ICD-10-CM | POA: Diagnosis not present

## 2022-08-22 DIAGNOSIS — E1159 Type 2 diabetes mellitus with other circulatory complications: Secondary | ICD-10-CM | POA: Diagnosis not present

## 2022-08-22 DIAGNOSIS — J40 Bronchitis, not specified as acute or chronic: Secondary | ICD-10-CM | POA: Diagnosis not present

## 2022-08-22 DIAGNOSIS — G4700 Insomnia, unspecified: Secondary | ICD-10-CM | POA: Diagnosis not present

## 2022-08-22 DIAGNOSIS — Z0001 Encounter for general adult medical examination with abnormal findings: Secondary | ICD-10-CM | POA: Diagnosis not present

## 2022-08-22 LAB — LIPID PANEL
Cholesterol: 172 (ref 0–200)
HDL: 28 — AB (ref 35–70)
LDL Cholesterol: 61
Triglycerides: 548 — AB (ref 40–160)

## 2022-08-22 LAB — BASIC METABOLIC PANEL
BUN: 10 (ref 4–21)
Creatinine: 1.1 (ref 0.6–1.3)

## 2022-08-22 LAB — TSH: TSH: 1.08 (ref 0.41–5.90)

## 2022-08-24 ENCOUNTER — Telehealth: Payer: Self-pay | Admitting: Cardiology

## 2022-08-24 NOTE — Telephone Encounter (Signed)
Results placed up front for scanning

## 2022-08-24 NOTE — Telephone Encounter (Signed)
Patient is calling stating he had labs done on 02/12 with his PCP. The results came back today and his triglycerides are at 548. Patient states he is going to have the results faxed over to the main [place] fax # and is requesting a callback to discuss possible medication changes. Please advise.

## 2022-08-25 ENCOUNTER — Telehealth: Payer: Self-pay | Admitting: Cardiovascular Disease

## 2022-08-25 ENCOUNTER — Telehealth: Payer: Self-pay | Admitting: Cardiology

## 2022-08-25 NOTE — Telephone Encounter (Signed)
I did not need this encounter. °

## 2022-08-25 NOTE — Telephone Encounter (Signed)
New Message:       Patient said his primary office called yesterday and said his Cholesterol was high. They are supposed to send the results to Dr Harl Bowie. Patient is concerned, he wants to know what is the plan to take care of this please?

## 2022-08-25 NOTE — Telephone Encounter (Signed)
Patient informed message already sent to provider - will notify his as soon as provider has a chance to review.

## 2022-08-25 NOTE — Telephone Encounter (Signed)
Will send to Dr. Harl Bowie for review - see labs scanned into epic.

## 2022-08-25 NOTE — Telephone Encounter (Signed)
Patient stated his lab work shows his triglycerides are high and he would like a call back to discuss next steps.

## 2022-08-25 NOTE — Telephone Encounter (Signed)
Can he confirm this was a true fasting sample? No mild or coffee creamer before?  Is he eating a lot of sweets, fried foods, sodas, sweet tea, beer, liquor? This drive up the triglycerides Pending answer we may need to start him on a medication called vascepa  J Casondra Gasca MD

## 2022-08-26 NOTE — Telephone Encounter (Signed)
Please start vascepa 2g bid please.  Zandra Abts MD

## 2022-08-26 NOTE — Telephone Encounter (Signed)
Discussed with patient, stated that he was truly fasting this time.  Does have an occasional Dr. Malachi Bonds, no alcohol, & nothing excessive on the fried foods.

## 2022-08-29 ENCOUNTER — Telehealth: Payer: Self-pay | Admitting: Cardiology

## 2022-08-29 MED ORDER — ICOSAPENT ETHYL 1 G PO CAPS: 2.0000 g | ORAL_CAPSULE | Freq: Two times a day (BID) | ORAL | 6 refills | Status: DC

## 2022-08-29 NOTE — Telephone Encounter (Signed)
Pt c/o medication issue:  1. Name of Medication:   icosapent Ethyl (VASCEPA) 1 g capsule    2. How are you currently taking this medication (dosage and times per day)?   3. Are you having a reaction (difficulty breathing--STAT)?   4. What is your medication issue? Pt called in stating he hasn't received the medication yet.  Called pharmacy they stated medication needs PA.

## 2022-08-29 NOTE — Addendum Note (Signed)
Addended by: Laurine Blazer on: 08/29/2022 10:45 AM   Modules accepted: Orders

## 2022-08-29 NOTE — Telephone Encounter (Signed)
Call placed to pharmacy - insurance requesting as brand name for $0.00 co-pay.

## 2022-08-29 NOTE — Telephone Encounter (Signed)
Patient notified and verbalized understanding.  New prescription sent to [facility].

## 2022-08-29 NOTE — Telephone Encounter (Signed)
Patient notified and verbalized understanding. 

## 2022-08-31 ENCOUNTER — Telehealth: Payer: Self-pay | Admitting: Cardiology

## 2022-08-31 NOTE — Telephone Encounter (Signed)
Pt c/o medication issue:  1. Name of Medication:   icosapent Ethyl (VASCEPA) 1 g capsule    2. How are you currently taking this medication (dosage and times per day)? 2 capsules twice a day  3. Are you having a reaction (difficulty breathing--STAT)? no  4. What is your medication issue? Patient calling to verify if he is to continue all his other medications along with the vascepa.

## 2022-08-31 NOTE — Telephone Encounter (Signed)
Jessica pharmacist at [facility] who says vascepa 1 mg was picked up yesterday and patient contacted and advised that he is to stay on all other medications as prescribed. Verbalized understanding of plan.

## 2022-09-15 ENCOUNTER — Other Ambulatory Visit: Payer: Self-pay | Admitting: "Endocrinology

## 2022-09-20 DIAGNOSIS — G894 Chronic pain syndrome: Secondary | ICD-10-CM | POA: Diagnosis not present

## 2022-09-30 ENCOUNTER — Other Ambulatory Visit: Payer: Self-pay

## 2022-09-30 ENCOUNTER — Emergency Department (HOSPITAL_COMMUNITY): Payer: 59

## 2022-09-30 ENCOUNTER — Emergency Department (HOSPITAL_COMMUNITY)
Admission: EM | Admit: 2022-09-30 | Discharge: 2022-09-30 | Disposition: A | Discharge status: Home / Self Care | Payer: 59 | Attending: Emergency Medicine | Admitting: Emergency Medicine

## 2022-09-30 DIAGNOSIS — J40 Bronchitis, not specified as acute or chronic: Secondary | ICD-10-CM | POA: Insufficient documentation

## 2022-09-30 DIAGNOSIS — I2510 Atherosclerotic heart disease of native coronary artery without angina pectoris: Secondary | ICD-10-CM | POA: Diagnosis not present

## 2022-09-30 DIAGNOSIS — Z79899 Other long term (current) drug therapy: Secondary | ICD-10-CM | POA: Insufficient documentation

## 2022-09-30 DIAGNOSIS — F17200 Nicotine dependence, unspecified, uncomplicated: Secondary | ICD-10-CM | POA: Insufficient documentation

## 2022-09-30 DIAGNOSIS — Z20822 Contact with and (suspected) exposure to covid-19: Secondary | ICD-10-CM | POA: Insufficient documentation

## 2022-09-30 DIAGNOSIS — Z7982 Long term (current) use of aspirin: Secondary | ICD-10-CM | POA: Insufficient documentation

## 2022-09-30 DIAGNOSIS — E119 Type 2 diabetes mellitus without complications: Secondary | ICD-10-CM | POA: Diagnosis not present

## 2022-09-30 DIAGNOSIS — Z794 Long term (current) use of insulin: Secondary | ICD-10-CM | POA: Diagnosis not present

## 2022-09-30 DIAGNOSIS — I10 Essential (primary) hypertension: Secondary | ICD-10-CM | POA: Diagnosis not present

## 2022-09-30 DIAGNOSIS — R059 Cough, unspecified: Secondary | ICD-10-CM | POA: Diagnosis not present

## 2022-09-30 DIAGNOSIS — Z7984 Long term (current) use of oral hypoglycemic drugs: Secondary | ICD-10-CM | POA: Diagnosis not present

## 2022-09-30 LAB — RESP PANEL BY RT-PCR (RSV, FLU A&B, COVID)  RVPGX2
Influenza A by PCR: NEGATIVE
Influenza B by PCR: NEGATIVE
Resp Syncytial Virus by PCR: NEGATIVE
SARS Coronavirus 2 by RT PCR: NEGATIVE

## 2022-09-30 LAB — CBG MONITORING, ED: Glucose-Capillary: 196 mg/dL — ABNORMAL HIGH (ref 70–99)

## 2022-09-30 MED ORDER — ALBUTEROL SULFATE HFA 108 (90 BASE) MCG/ACT IN AERS
1.0000 | INHALATION_SPRAY | RESPIRATORY_TRACT | Status: DC | PRN
  Administered 2022-09-30: 2 via RESPIRATORY_TRACT
  Filled 2022-09-30: qty 6.7

## 2022-09-30 MED ORDER — DEXAMETHASONE SODIUM PHOSPHATE 10 MG/ML IJ SOLN
10.0000 mg | Freq: Once | INTRAMUSCULAR | Status: AC
  Administered 2022-09-30: 10 mg via INTRAMUSCULAR
  Filled 2022-09-30: qty 1

## 2022-09-30 MED ORDER — AEROCHAMBER Z-STAT PLUS/MEDIUM MISC
1.0000 | Freq: Once | Status: AC
  Administered 2022-09-30: 1

## 2022-09-30 MED ORDER — ALBUTEROL SULFATE (2.5 MG/3ML) 0.083% IN NEBU: 2.5000 mg | INHALATION_SOLUTION | Freq: Four times a day (QID) | RESPIRATORY_TRACT | 0 refills | Status: AC | PRN

## 2022-09-30 MED ORDER — DOXYCYCLINE HYCLATE 100 MG PO CAPS: 100.0000 mg | ORAL_CAPSULE | Freq: Two times a day (BID) | ORAL | 0 refills | Status: DC

## 2022-09-30 MED ORDER — IPRATROPIUM-ALBUTEROL 0.5-2.5 (3) MG/3ML IN SOLN
3.0000 mL | Freq: Once | RESPIRATORY_TRACT | Status: AC
  Administered 2022-09-30: 3 mL via RESPIRATORY_TRACT
  Filled 2022-09-30: qty 3

## 2022-09-30 MED ORDER — HYDROCOD POLI-CHLORPHE POLI ER 10-8 MG/5ML PO SUER: 5.0000 mL | Freq: Two times a day (BID) | ORAL | 0 refills | Status: DC | PRN

## 2022-09-30 NOTE — ED Triage Notes (Signed)
Pt states he has a cough that he has had for 2 weeks.  Reports that he is currently on amoxicillin but they aren't helping. He is unable to remember how many days he has been on the antibiotics.

## 2022-09-30 NOTE — ED Provider Notes (Signed)
[place] Provider Note   CSN: [id number] Arrival date & time: 09/30/22  1804     History  Chief Complaint  Patient presents with   Cough    Slate D Poepping is a 57 y.o. male.  Pt is a 57 yo male with pmhx significant for HLD, DM, CAD, HTN, chronic back pain, and tobacco abuse.  Pt said he's had a cough for 2 weeks.  He has been on amox, but does not think that is helping.  He's not sure how long he's been on amox.  No fevers.  He continues to smoke, but could not smoke his usual half pack per day today.       Home Medications Prior to Admission medications   Medication Sig Start Date End Date Taking? Authorizing Provider  albuterol (PROVENTIL) (2.5 MG/3ML) 0.083% nebulizer solution Take 3 mLs (2.5 mg total) by nebulization every 6 (six) hours as needed for wheezing or shortness of breath. 09/30/22  Yes Isla Pence, MD  chlorpheniramine-HYDROcodone (TUSSIONEX) 10-8 MG/5ML Take 5 mLs by mouth every 12 (twelve) hours as needed for cough. 09/30/22  Yes Isla Pence, MD  doxycycline (VIBRAMYCIN) 100 MG capsule Take 1 capsule (100 mg total) by mouth 2 (two) times daily. 09/30/22  Yes Isla Pence, MD  Albuterol Sulfate (PROAIR HFA IN) Inhale 2 puffs into the lungs as needed.    [provider]  aspirin 81 MG chewable tablet Chew 1 tablet (81 mg total) by mouth daily. 03/15/14   Eileen Stanford, PA-C  atorvastatin (LIPITOR) 80 MG tablet TAKE 1 TABLET(80 MG) BY MOUTH AT BEDTIME 07/05/22   Arnoldo Lenis, MD  Blood Glucose Monitoring Suppl (ACCU-CHEK GUIDE ME) w/Device KIT 1 Piece by Does not apply route as directed. 04/07/22   Cassandria Anger, MD  CRANBERRY PO Take 1 tablet by mouth every 14 (fourteen) days.    [provider]  furosemide (LASIX) 20 MG tablet Take 1 tablet (20 mg total) by mouth daily as needed. 04/13/22   Arnoldo Lenis, MD  glucose blood (ACCU-CHEK GUIDE) test strip USE TO MONITOR GLUCOSE  TWICE DAILY 07/14/22   Cassandria Anger, MD  ibuprofen (ADVIL,MOTRIN) 200 MG tablet Take 800 mg by mouth every 6 (six) hours as needed for fever, headache or mild pain.     [provider]  icosapent Ethyl (VASCEPA) 1 g capsule Take 2 capsules (2 g total) by mouth 2 (two) times daily. 08/29/22   Arnoldo Lenis, MD  Insulin Pen Needle 31G X 5 MM MISC Use to inject insulin 04/07/22   Cassandria Anger, MD  isosorbide mononitrate (IMDUR) 60 MG 24 hr tablet Take 1 tablet (60 mg total) by mouth daily. 06/30/22   Arnoldo Lenis, MD  LANTUS SOLOSTAR 100 UNIT/ML Solostar Pen ADMINISTER 50 UNITS UNDER THE SKIN AT BEDTIME 08/22/22   Cassandria Anger, MD  LINZESS 72 MCG capsule TAKE 1 CAPSULE(72 MCG) BY MOUTH DAILY BEFORE BREAKFAST 07/08/22   Mahala Menghini, PA-C  lisinopril (ZESTRIL) 2.5 MG tablet TAKE 1 TABLET(2.5 MG) BY MOUTH DAILY 02/16/22   Arnoldo Lenis, MD  metFORMIN (GLUCOPHAGE) 1000 MG tablet Take 1,000 mg by mouth 2 (two) times daily. 03/28/19   [provider]  Multiple Vitamin (MULTIVITAMIN) tablet Take 1 tablet by mouth daily.    [provider]  nitroGLYCERIN (NITROSTAT) 0.4 MG SL tablet Place 1 tablet (0.4 mg total) under the tongue every 5 (five) minutes x  3 doses as needed for chest pain (if no relief after 2nd dose, proceed to the ED or call 911). 08/26/21   Arnoldo Lenis, MD  Omega-3 Fatty Acids (SALMON OIL-1000 PO) Take 1 capsule by mouth daily.    [provider]  oxyCODONE-acetaminophen (PERCOCET) 5-325 MG tablet Take 1 tablet by mouth every 6 (six) hours as needed. 05/29/21   Milton Ferguson, MD  zolpidem (AMBIEN) 10 MG tablet Take 10 mg by mouth at bedtime as needed. 07/28/19   [provider]      Allergies    Patient has no known allergies.    Review of Systems   Review of Systems  Respiratory:  Positive for cough and shortness of breath.   All other systems reviewed and are negative.   Physical Exam Updated  Vital Signs BP 131/70 (BP Location: Right Arm)   Pulse 70   Temp 98.4 F (36.9 C) (Oral)   Resp 18   Ht 6\' 1"  (1.854 m)   Wt 95.7 kg   SpO2 100%   BMI 27.84 kg/m  Physical Exam Vitals and nursing note reviewed.  Constitutional:      Appearance: Normal appearance.  HENT:     Head: Normocephalic and atraumatic.     Right Ear: External ear normal.     Left Ear: External ear normal.     Nose: Nose normal.     Mouth/Throat:     Mouth: Mucous membranes are moist.     Pharynx: Oropharynx is clear.  Eyes:     Extraocular Movements: Extraocular movements intact.     Conjunctiva/sclera: Conjunctivae normal.     Pupils: Pupils are equal, round, and reactive to light.  Cardiovascular:     Rate and Rhythm: Normal rate and regular rhythm.     Pulses: Normal pulses.     Heart sounds: Normal heart sounds.  Pulmonary:     Breath sounds: Wheezing present.  Abdominal:     General: Abdomen is flat. Bowel sounds are normal.     Palpations: Abdomen is soft.  Musculoskeletal:        General: Normal range of motion.     Cervical back: Normal range of motion and neck supple.  Skin:    General: Skin is warm.     Capillary Refill: Capillary refill takes less than 2 seconds.  Neurological:     General: No focal deficit present.     Mental Status: He is alert and oriented to person, place, and time.  Psychiatric:        Mood and Affect: Mood normal.        Behavior: Behavior normal.     ED Results / Procedures / Treatments   Labs (all labs ordered are listed, but only abnormal results are displayed) Labs Reviewed  CBG MONITORING, ED - Abnormal; Notable for the following components:      Result Value   Glucose-Capillary 196 (*)    All other components within normal limits  RESP PANEL BY RT-PCR (RSV, FLU A&B, COVID)  [id number]    EKG None  Radiology DG Chest 2 View  Result Date: 09/30/2022 CLINICAL DATA:  Cough EXAM: CHEST - 2 VIEW COMPARISON:  X-ray report 09/29/2018. Films are not  immediately available. FINDINGS: No consolidation, pneumothorax or effusion. No edema. Normal cardiopericardial silhouette. Degenerative changes seen of the spine on lateral view. Air-fluid level along the stomach beneath the left hemidiaphragm. IMPRESSION: No acute cardiopulmonary disease Electronically Signed   By: Larose Hires.D.  On: 09/30/2022 18:52    Procedures Procedures    Medications Ordered in ED Medications  albuterol (VENTOLIN HFA) 108 (90 Base) MCG/ACT inhaler 1-2 puff (has no administration in time range)  aerochamber Z-Stat Plus/medium 1 each (has no administration in time range)  ipratropium-albuterol (DUONEB) 0.5-2.5 (3) MG/3ML nebulizer solution 3 mL (3 mLs Nebulization Given 09/30/22 1843)  dexamethasone (DECADRON) injection 10 mg (10 mg Intramuscular Given 09/30/22 1908)    ED Course/ Medical Decision Making/ A&P                             Medical Decision Making Amount and/or Complexity of Data Reviewed Radiology: ordered.  Risk Prescription drug management.   This patient presents to the ED for concern of cough, this involves an extensive number of treatment options, and is a complaint that carries with it a high risk of complications and morbidity.  The differential diagnosis includes pna, covid/flu/rsv, rad   Co morbidities that complicate the patient evaluation   HLD, DM, CAD, HTN, chronic back pain, and tobacco abuse   Additional history obtained:  Additional history obtained from epic chart review  Lab Tests:  I Ordered, and personally interpreted labs.  The pertinent results include:  covid/flu/rsv neg   Imaging Studies ordered:  I ordered imaging studies including cxr  I independently visualized and interpreted imaging which showed No acute cardiopulmonary disease  I agree with the radiologist interpretation   Cardiac Monitoring:  The patient was maintained on a cardiac monitor.  I personally viewed and interpreted the cardiac  monitored which showed an underlying rhythm of: nsr   Medicines ordered and prescription drug management:  I ordered medication including decadron/duoneb  for sob  Reevaluation of the patient after these medicines showed that the patient improved I have reviewed the patients home medicines and have made adjustments as needed   Test Considered:  cxr   Critical Interventions:  nebs   Problem List / ED Course:  Cough:  Likely related to tobacco abuse.  However, as it's been going on for 2 weeks, I will put him on doxy and have him stop amox.  He is to try to stop smoking.  He is also given an inhaler+ spacer and a refill for his albuterol neb soln.  He requests cough syrup, so he's given tussionex.  Return if worse.  F/u with pcp.   Reevaluation:  After the interventions noted above, I reevaluated the patient and found that they have :improved   Social Determinants of Health:  Lives at home   Dispostion:  After consideration of the diagnostic results and the patients response to treatment, I feel that the patent would benefit from discharge with outpatient f/u.          Final Clinical Impression(s) / ED Diagnoses Final diagnoses:  Bronchitis    Rx / DC Orders ED Discharge Orders          Ordered    doxycycline (VIBRAMYCIN) 100 MG capsule  2 times daily        09/30/22 1932    chlorpheniramine-HYDROcodone (TUSSIONEX) 10-8 MG/5ML  Every 12 hours PRN        09/30/22 1938    albuterol (PROVENTIL) (2.5 MG/3ML) 0.083% nebulizer solution  Every 6 hours PRN        09/30/22 1942              Isla Pence, MD 09/30/22 1943

## 2022-09-30 NOTE — Discharge Instructions (Addendum)
Try to stop smoking. °

## 2022-10-05 ENCOUNTER — Ambulatory Visit: Payer: 59 | Admitting: "Endocrinology

## 2022-10-12 DIAGNOSIS — M75101 Unspecified rotator cuff tear or rupture of right shoulder, not specified as traumatic: Secondary | ICD-10-CM | POA: Diagnosis not present

## 2022-10-12 DIAGNOSIS — M75102 Unspecified rotator cuff tear or rupture of left shoulder, not specified as traumatic: Secondary | ICD-10-CM | POA: Diagnosis not present

## 2022-10-12 DIAGNOSIS — M7581 Other shoulder lesions, right shoulder: Secondary | ICD-10-CM | POA: Diagnosis not present

## 2022-10-13 ENCOUNTER — Encounter: Payer: Self-pay | Admitting: Internal Medicine

## 2022-10-21 DIAGNOSIS — G894 Chronic pain syndrome: Secondary | ICD-10-CM | POA: Diagnosis not present

## 2022-11-02 ENCOUNTER — Ambulatory Visit (INDEPENDENT_AMBULATORY_CARE_PROVIDER_SITE_OTHER): Payer: 59 | Admitting: "Endocrinology

## 2022-11-02 ENCOUNTER — Encounter: Payer: Self-pay | Admitting: "Endocrinology

## 2022-11-02 VITALS — BP 120/76 | HR 68 | Ht 73.0 in | Wt 208.8 lb

## 2022-11-02 DIAGNOSIS — E1159 Type 2 diabetes mellitus with other circulatory complications: Secondary | ICD-10-CM | POA: Diagnosis not present

## 2022-11-02 DIAGNOSIS — F17200 Nicotine dependence, unspecified, uncomplicated: Secondary | ICD-10-CM | POA: Diagnosis not present

## 2022-11-02 DIAGNOSIS — I2510 Atherosclerotic heart disease of native coronary artery without angina pectoris: Secondary | ICD-10-CM | POA: Diagnosis not present

## 2022-11-02 DIAGNOSIS — I10 Essential (primary) hypertension: Secondary | ICD-10-CM

## 2022-11-02 DIAGNOSIS — E782 Mixed hyperlipidemia: Secondary | ICD-10-CM

## 2022-11-02 DIAGNOSIS — Z7984 Long term (current) use of oral hypoglycemic drugs: Secondary | ICD-10-CM

## 2022-11-02 DIAGNOSIS — Z794 Long term (current) use of insulin: Secondary | ICD-10-CM | POA: Diagnosis not present

## 2022-11-02 LAB — POCT GLYCOSYLATED HEMOGLOBIN (HGB A1C): HbA1c, POC (controlled diabetic range): 7.9 % — AB (ref 0.0–7.0)

## 2022-11-02 MED ORDER — INSULIN PEN NEEDLE 31G X 5 MM MISC: 1 refills | Status: DC

## 2022-11-02 MED ORDER — ACCU-CHEK SOFTCLIX LANCETS MISC: 3 refills | Status: DC

## 2022-11-02 NOTE — Progress Notes (Signed)
11/02/2022, 6:46 PM  Endocrinology follow-up note   Subjective:    Patient ID: [name], male    DOB: 12-Sep-1965.  [name] is being seen in follow-up after he was seen in consultation for management of currently uncontrolled symptomatic diabetes requested by  [name], MD.   Past Medical History:  Diagnosis Date   Anxiety    Borderline diabetes    Bulging disc    Chronic back pain    Closed intra-articular fracture of distal end of left tibia    Diabetes mellitus without complication    HTN (hypertension)    Hypercholesteremia    Myocardial infarction    STEMI 2015 with BMS to RCA   Pancreatitis     Past Surgical History:  Procedure Laterality Date   ABDOMINAL SURGERY  1990   adhesions, blockage.    APPENDECTOMY     BACK SURGERY     CARDIAC CATHETERIZATION  2003   "trivial coronary artery disease," normal EF   COLONOSCOPY N/A 04/16/2015   Procedure: COLONOSCOPY;  Surgeon: Corbin Ade, MD;  Location: [facility];  Service: Endoscopy;  Laterality: N/A;  12:45 PM   COLONOSCOPY WITH PROPOFOL N/A 06/28/2018   Rourk: Diverticulosis, 2 tubular adenomas removed.  Next colonoscopy in 5 years.   LEFT HEART CATH AND CORONARY ANGIOGRAPHY N/A 05/09/2017   Procedure: LEFT HEART CATH AND CORONARY ANGIOGRAPHY;  Surgeon: Marykay Lex, MD;  Location[facility] INVASIVE CV LAB;  Service: Cardiovascular;  Laterality: N/A;   LEFT HEART CATHETERIZATION WITH CORONARY ANGIOGRAM N/A 03/13/2014   Procedure: LEFT HEART CATHETERIZATION WITH CORONARY ANGIOGRAM;  Surgeon: Marykay Lex, MD;  Location: [facility] CATH LAB;  Service: Cardiovascular;  Laterality: N/A;   OPEN REDUCTION INTERNAL FIXATION (ORIF) TIBIA/FIBULA FRACTURE Left 10/01/2018   Procedure: OPEN REDUCTION INTERNAL FIXATION LEFT PILON FRACTURE;  Surgeon: Myrene Galas, MD;  Location: [facility] OR;  Service: Orthopedics;  Laterality: Left;   POLYPECTOMY  06/28/2018   Procedure: POLYPECTOMY;  Surgeon: Corbin Ade, MD;  Location: [facility];  Service: Endoscopy;;  (colon)    Social History   Socioeconomic History   Marital status: Single    Spouse name: Not on file   Number of children: Not on file   Years of education: Not on file   Highest education level: Not on file  Occupational History   Not on file  Tobacco Use   Smoking status: Every Day    Packs/day: 0.50    Years: 30.00    Additional pack years: 0.00    Total pack years: 15.00    Types: Cigarettes    Start date: 10/25/1977   Smokeless tobacco: Never  Vaping Use   Vaping Use: Never used  Substance and Sexual Activity   Alcohol use: Not Currently    Alcohol/week: 6.0 standard drinks of alcohol    Types: 6 Cans of beer per week    Comment: denied 06/08/21   Drug use: No   Sexual activity: Yes  Other Topics Concern   Not on file  Social History Narrative   Not on file   Social Determinants of Health   Financial Resource Strain: Not on file  Food Insecurity: Not on file  Transportation Needs: Not on file  Physical Activity: Not on file  Stress: Not on file  Social Connections: Not on file    Family History  Problem Relation Age of Onset   Hypertension Mother    Hyperlipidemia  Mother    Diabetes Father    Hyperlipidemia Father    Hypertension Father    Hyperlipidemia Sister    Hypertension Sister    Diabetes Sister    Colon cancer Paternal Uncle        over age 51    Outpatient Encounter Medications as of 11/02/2022  Medication Sig   Accu-Chek Softclix Lancets lancets Use as instructed   albuterol (PROVENTIL) (2.5 MG/3ML) 0.083% nebulizer solution Take 3 mLs (2.5 mg total) by nebulization every 6 (six) hours as needed for wheezing or shortness of breath.   Albuterol Sulfate (PROAIR HFA IN) Inhale 2 puffs into the lungs as needed.   aspirin 81 MG chewable tablet Chew 1 tablet (81 mg total) by mouth daily.   atorvastatin (LIPITOR) 80 MG tablet TAKE 1 TABLET(80 MG) BY MOUTH AT BEDTIME   Blood  Glucose Monitoring Suppl (ACCU-CHEK GUIDE ME) w/Device KIT 1 Piece by Does not apply route as directed.   chlorpheniramine-HYDROcodone (TUSSIONEX) 10-8 MG/5ML Take 5 mLs by mouth every 12 (twelve) hours as needed for cough.   CRANBERRY PO Take 1 tablet by mouth every 14 (fourteen) days.   doxycycline (VIBRAMYCIN) 100 MG capsule Take 1 capsule (100 mg total) by mouth 2 (two) times daily.   furosemide (LASIX) 20 MG tablet Take 1 tablet (20 mg total) by mouth daily as needed.   glucose blood (ACCU-CHEK GUIDE) test strip USE TO MONITOR GLUCOSE TWICE DAILY   ibuprofen (ADVIL,MOTRIN) 200 MG tablet Take 800 mg by mouth every 6 (six) hours as needed for fever, headache or mild pain.    icosapent Ethyl (VASCEPA) 1 g capsule Take 2 capsules (2 g total) by mouth 2 (two) times daily.   Insulin Pen Needle 31G X 5 MM MISC Use to inject insulin   isosorbide mononitrate (IMDUR) 60 MG 24 hr tablet Take 1 tablet (60 mg total) by mouth daily.   LANTUS SOLOSTAR 100 UNIT/ML Solostar Pen ADMINISTER 50 UNITS UNDER THE SKIN AT BEDTIME   LINZESS 72 MCG capsule TAKE 1 CAPSULE(72 MCG) BY MOUTH DAILY BEFORE BREAKFAST   lisinopril (ZESTRIL) 2.5 MG tablet TAKE 1 TABLET(2.5 MG) BY MOUTH DAILY   metFORMIN (GLUCOPHAGE) 1000 MG tablet Take 1,000 mg by mouth 2 (two) times daily.   Multiple Vitamin (MULTIVITAMIN) tablet Take 1 tablet by mouth daily.   nitroGLYCERIN (NITROSTAT) 0.4 MG SL tablet Place 1 tablet (0.4 mg total) under the tongue every 5 (five) minutes x 3 doses as needed for chest pain (if no relief after 2nd dose, proceed to the ED or call 911).   Omega-3 Fatty Acids (SALMON OIL-1000 PO) Take 1 capsule by mouth daily.   oxyCODONE-acetaminophen (PERCOCET) 5-325 MG tablet Take 1 tablet by mouth every 6 (six) hours as needed.   zolpidem (AMBIEN) 10 MG tablet Take 10 mg by mouth at bedtime as needed.   [DISCONTINUED] Insulin Pen Needle 31G X 5 MM MISC Use to inject insulin   No facility-administered encounter medications  on file as of 11/02/2022.    ALLERGIES: No Known Allergies  VACCINATION STATUS: Immunization History  Administered Date(s) Administered   Influenza,inj,Quad PF,6+ Mos 05/27/2014   Tdap 05/29/2021    Diabetes He presents for his follow-up diabetic visit. He has type 2 diabetes mellitus. Onset time: He was diagnosed at approximate age of 49 years. His disease course has been fluctuating. There are no hypoglycemic associated symptoms. Pertinent negatives for hypoglycemia include no confusion, headaches, pallor or seizures. Pertinent negatives for diabetes include no blurred  vision, no chest pain, no fatigue, no polydipsia, no polyphagia, no polyuria and no weakness. There are no hypoglycemic complications. Symptoms are worsening. Diabetic complications include heart disease. (He is status post stent placement in 1 large coronary artery.) Risk factors for coronary artery disease include dyslipidemia, diabetes mellitus, family history, hypertension, male sex, sedentary lifestyle and tobacco exposure. Current diabetic treatment includes insulin injections (He is currently on Lantus 14 units nightly, metformin 1000 mg p.o. twice daily.). His weight is fluctuating minimally. He is following a generally unhealthy diet. When asked about meal planning, he reported none. He has not had a previous visit with a dietitian. He never participates in exercise. His home blood glucose trend is increasing steadily. His breakfast blood glucose range is generally 140-180 mg/dl. His overall blood glucose range is 140-180 mg/dl. (He does not monitor adequately.  He presents with a meter showing only 1 reading in the last 14 days, 8 readings in the last 90 days.  His average blood glucose is 157.  His point-of-care A1c is 7.9% slightly improving from 8.2% during his last visit.  He did not document hypoglycemia. ) An ACE inhibitor/angiotensin II receptor blocker is being taken. Eye exam is current.  Hyperlipidemia This is a  chronic problem. The problem is resistant. Exacerbating diseases include diabetes. Pertinent negatives include no chest pain, myalgias or shortness of breath. Current antihyperlipidemic treatment includes statins. Risk factors for coronary artery disease include dyslipidemia, diabetes mellitus, family history, male sex, hypertension and a sedentary lifestyle.  Hypertension This is a chronic problem. The current episode started more than 1 year ago. Pertinent negatives include no blurred vision, chest pain, headaches, neck pain, palpitations or shortness of breath. Risk factors for coronary artery disease include dyslipidemia, diabetes mellitus, smoking/tobacco exposure, sedentary lifestyle, male gender and family history. Past treatments include ACE inhibitors.     Review of Systems  Constitutional:  Negative for chills, fatigue, fever and unexpected weight change.  HENT:  Negative for dental problem, mouth sores and trouble swallowing.   Eyes:  Negative for blurred vision and visual disturbance.  Respiratory:  Negative for cough, choking, chest tightness, shortness of breath and wheezing.   Cardiovascular:  Negative for chest pain, palpitations and leg swelling.  Gastrointestinal:  Negative for abdominal distention, abdominal pain, constipation, diarrhea, nausea and vomiting.  Endocrine: Negative for polydipsia, polyphagia and polyuria.  Genitourinary:  Negative for dysuria, flank pain, hematuria and urgency.  Musculoskeletal:  Negative for back pain, gait problem, myalgias and neck pain.  Skin:  Negative for pallor, rash and wound.  Neurological:  Negative for seizures, syncope, weakness, numbness and headaches.  Psychiatric/Behavioral:  Negative for confusion and dysphoric mood.     Objective:       11/02/2022    3:46 PM 09/30/2022    7:00 PM 09/30/2022    6:25 PM  Vitals with BMI  Height 6\' 1"   6\' 1"   Weight 208 lbs 13 oz  211 lbs  BMI 27.55  27.84  Systolic 120 128   Diastolic 76  72   Pulse 68 78     BP 120/76   Pulse 68   Ht 6\' 1"  (1.854 m)   Wt 208 lb 12.8 oz (94.7 kg)   BMI 27.55 kg/m   Wt Readings from Last 3 Encounters:  11/02/22 208 lb 12.8 oz (94.7 kg)  09/30/22 211 lb (95.7 kg)  06/30/22 216 lb 3.2 oz (98.1 kg)      CMP ( most recent) CMP  Component Value Date/Time   NA 142 03/25/2022 1102   K 4.5 03/25/2022 1102   CL 98 03/25/2022 1102   CO2 23 03/25/2022 1102   GLUCOSE 143 (H) 03/25/2022 1102   GLUCOSE 134 (H) 05/29/2021 1635   BUN 10 08/22/2022 0000   CREATININE 1.1 08/22/2022 0000   CREATININE 0.80 03/25/2022 1102   CALCIUM 9.7 03/25/2022 1102   PROT 6.9 03/25/2022 1102   ALBUMIN 4.4 03/25/2022 1102   AST 14 03/25/2022 1102   ALT 20 03/25/2022 1102   ALKPHOS 89 03/25/2022 1102   BILITOT 0.4 03/25/2022 1102   GFRNONAA >60 05/29/2021 1620   GFRAA >60 02/06/2020 0035     Diabetic Labs (most recent): Lab Results  Component Value Date   HGBA1C 7.9 (A) 11/02/2022   HGBA1C 8.2 (A) 04/07/2022   HGBA1C 7.9 (A) 12/16/2021     Lipid Panel ( most recent) Lipid Panel     Component Value Date/Time   CHOL 172 08/22/2022 0000   CHOL 178 03/25/2022 1102   TRIG 548 (A) 08/22/2022 0000   HDL 28 (A) 08/22/2022 0000   HDL 36 (L) 03/25/2022 1102   CHOLHDL 4.9 03/25/2022 1102   CHOLHDL 17.3 01/20/2014 0528   VLDL UNABLE TO CALCULATE IF TRIGLYCERIDE OVER 400 mg/dL 40/98/1191 4782   LDLCALC 61 08/22/2022 0000   LDLCALC 75 03/25/2022 1102   LABVLDL 67 (H) 03/25/2022 1102      Lab Results  Component Value Date   TSH 1.08 08/22/2022   TSH 1.630 03/25/2022   TSH 1.36 02/24/2022   TSH 1.120 07/26/2021   TSH 1.100 01/19/2014   FREET4 1.15 03/25/2022   FREET4 1.37 07/26/2021      Assessment & Plan:   1. Type 2 diabetes mellitus with vascular disease (HCC)   - [name] has currently uncontrolled symptomatic type 2 DM since  57 years of age.  He does not monitor adequately.  He presents with a meter showing only 1  reading in the last 14 days, 8 readings in the last 90 days.  His average blood glucose is 157.  His point-of-care A1c is 7.9% slightly improving from 8.2% during his last visit.  He did not document hypoglycemia.  - I had a long discussion with him about the progressive nature of diabetes and the pathology behind its complications. -his diabetes is complicated by coronary artery disease, chronic heavy smoking, sedentary life and he remains at a high risk for more acute and chronic complications which include CAD, CVA, CKD, retinopathy, and neuropathy. These are all discussed in detail with him.  - I have counseled him on diet  and weight management  by adopting a carbohydrate restricted/protein rich diet. Patient is encouraged to switch to  unprocessed or minimally processed     complex starch and increased protein intake (animal or plant source), fruits, and vegetables. -  he is advised to stick to a routine mealtimes to eat 3 meals  a day and avoid unnecessary snacks ( to snack only to correct hypoglycemia).    - he acknowledges that there is a room for improvement in his food and drink choices. - Suggestion is made for him to avoid simple carbohydrates  from his diet including Cakes, Sweet Desserts, Ice Cream, Soda (diet and regular), Sweet Tea, Candies, Chips, Cookies, Store Bought Juices, Alcohol , Artificial Sweeteners,  Coffee Creamer, and "Sugar-free" Products, Lemonade. This will help patient to have more stable blood glucose profile and potentially avoid unintended weight gain.  The following Lifestyle Medicine recommendations according to American College of Lifestyle Medicine  [facility]) were discussed and and offered to patient and he  agrees to start the journey:  A. Whole Foods, Plant-Based Nutrition comprising of fruits and vegetables, plant-based proteins, whole-grain carbohydrates was discussed in detail with the patient.   A list for source of those nutrients were also provided to the  patient.  Patient will use only water or unsweetened tea for hydration. B.  The need to stay away from risky substances including alcohol, smoking; obtaining 7 to 9 hours of restorative sleep, at least 150 minutes of moderate intensity exercise weekly, the importance of healthy social connections,  and stress management techniques were discussed. C.  A full color page of  Calorie density of various food groups per pound showing examples of each food groups was provided to the patient.   - he will be scheduled with Norm Salt, RDN, CDE for diabetes education.  - I have approached him with the following individualized plan to manage  his diabetes and patient agrees:   -In light of his presentation with above target glycemic profile and A1c, he will need a higher dose of insulin.  However he is not commitment for monitoring makes optimization of his treatment impossible or difficult.    For safety reasons, he is advised to continue Lantus at 50 units nightly, continue metformin 1000 mg p.o. twice daily.  He is approached to monitor blood glucose twice a day-daily before breakfast and at bedtime and return in 4 months with meter or logs for reevaluation.   In the meantime, he is encouraged to call clinic for hypoglycemia below 70 mg per DL or hyperglycemia above 200 mg per DL.  - he is warned not to take insulin without proper monitoring per orders.  -he did not tolerate Jardiance, Farxiga in the past and he does not want to start any of those medications. -He still has uncontrolled hypertriglyceridemia at 548, although improving from  1132.  He has his committed for regular follow-up, he will be considered for GLP-1 receptor agonist on subsequent visits.    - Specific targets for  A1c;  LDL, HDL,  and Triglycerides were discussed with the patient.  2) Blood Pressure /Hypertension:  -His blood pressure is controlled to target.  he is advised to continue his current medications including  lisinopril 2.5 mg p.o. daily with breakfast . 3) Lipids/Hyperlipidemia:   Review of his recent lipid panel showed controlled LDL at 61.  See above for hypertriglyceridemia.  He is advised to continue Lipitor 40 mg p.o. nightly.  He was also initiated on Vascepa 2 g p.o. twice daily by his PCP.  Whole food plant-based diet, discussed in detail, will help with dyslipidemia as well.  4)  Weight/Diet:  Body mass index is 27.55 kg/m.  -      he is  a candidate for some  weight loss. I discussed with him the fact that loss of 5 - 10% of his  current body weight will have the most impact on his diabetes management.  Exercise, and detailed carbohydrates information provided  -  detailed on discharge instructions.  5) Chronic Care/Health Maintenance: 6) chronic smoker  -he  is on ACEI/ARB and Statin medications and  is encouraged to initiate and continue to follow up with Ophthalmology, Dentist,  Podiatrist at least yearly or according to recommendations, and advised to  quit smoking. I have recommended yearly flu vaccine and pneumonia vaccine at least  every 5 years; moderate intensity exercise for up to 150 minutes weekly; and  sleep for at least 7 hours a day.  -He is started on low-dose vitamin D3 2000 units daily.  The patient was counseled on the dangers of tobacco use, and was advised to quit.  Reviewed strategies to maximize success, including removing cigarettes and smoking materials from environment.  - he is  advised to maintain close follow up with [name], MD for primary care needs, as well as his other providers for optimal and coordinated care.  I spent  29  minutes in the care of the patient today including review of labs from CMP, Lipids, Thyroid Function, Hematology (current and previous including abstractions from other facilities); face-to-face time discussing  his blood glucose readings/logs, discussing hypoglycemia and hyperglycemia episodes and symptoms, medications doses, his  options of short and long term treatment based on the latest standards of care / guidelines;  discussion about incorporating lifestyle medicine;  and documenting the encounter. Risk reduction counseling performed per USPSTF guidelines to reduce  obesity and cardiovascular risk factors.     Please refer to Patient Instructions for Blood Glucose Monitoring and Insulin/Medications Dosing Guide"  in media tab for additional information. Please  also refer to " Patient Self Inventory" in the Media  tab for reviewed elements of pertinent patient history.  [name] participated in the discussions, expressed understanding, and voiced agreement with the above plans.  All questions were answered to his satisfaction. he is encouraged to contact clinic should he have any questions or concerns prior to his return visit.    Follow up plan: - Return in about 4 months (around 03/04/2023) for F/U with Pre-visit Labs, Meter/CGM/Logs, A1c here.  Marquis Lunch, MD [facility] [address] [place], [place] [address] Phone: [phone]  Fax: [phone]    11/02/2022, 6:46 PM  This note was partially dictated with voice recognition software. Similar sounding words can be transcribed inadequately or may not  be corrected upon review.

## 2022-11-02 NOTE — Patient Instructions (Signed)
                                     Advice for Weight Management  -For most of us the best way to lose weight is by diet management. Generally speaking, diet management means consuming less calories intentionally which over time brings about progressive weight loss.  This can be achieved more effectively by avoiding ultra processed carbohydrates, processed meats, unhealthy fats.    It is critically important to know your numbers: how much calorie you are consuming and how much calorie you need. More importantly, our carbohydrates sources should be unprocessed naturally occurring  complex starch food items.  It is always important to balance nutrition also by  appropriate intake of proteins (mainly plant-based), healthy fats/oils, plenty of fruits and vegetables.   -The American College of Lifestyle Medicine (ACL M) recommends nutrition derived mostly from Whole Food, Plant Predominant Sources example an apple instead of applesauce or apple pie. Eat Plenty of vegetables, Mushrooms, fruits, Legumes, Whole Grains, Nuts, seeds in lieu of processed meats, processed snacks/pastries red meat, poultry, eggs.  Use only water or unsweetened tea for hydration.  The College also recommends the need to stay away from risky substances including alcohol, smoking; obtaining 7-9 hours of restorative sleep, at least 150 minutes of moderate intensity exercise weekly, importance of healthy social connections, and being mindful of stress and seek help when it is overwhelming.    -Sticking to a routine mealtime to eat 3 meals a day and avoiding unnecessary snacks is shown to have a big role in weight control. Under normal circumstances, the only time we burn stored energy is when we are hungry, so allow  some hunger to take place- hunger means no food between appropriate meal times, only water.  It is not advisable to starve.   -It is better to avoid simple carbohydrates including:  Cakes, Sweet Desserts, Ice Cream, Soda (diet and regular), Sweet Tea, Candies, Chips, Cookies, Store Bought Juices, Alcohol in Excess of  1-2 drinks a day, Lemonade,  Artificial Sweeteners, Doughnuts, Coffee Creamers, "Sugar-free" Products, etc, etc.  This is not a complete list.....    -Consulting with certified diabetes educators is proven to provide you with the most accurate and current information on diet.  Also, you may be  interested in discussing diet options/exchanges , we can schedule a visit with [name], RDN, CDE for individualized nutrition education.  -Exercise: If you are able: 30 -60 minutes a day ,4 days a week, or 150 minutes of moderate intensity exercise weekly.    The longer the better if tolerated.  Combine stretch, strength, and aerobic activities.  If you were told in the past that you have high risk for cardiovascular diseases, or if you are currently symptomatic, you may seek evaluation by your heart doctor prior to initiating moderate to intense exercise programs.                                  Additional Care Considerations for Diabetes/Prediabetes   -Diabetes  is a chronic disease.  The most important care consideration is regular follow-up with your diabetes care provider with the goal being avoiding or delaying its complications and to take advantage of advances in medications and technology.  If appropriate actions are taken early enough, type 2 diabetes can even be   reversed.  Seek information from the right source.  - Whole Food, Plant Predominant Nutrition is highly recommended: Eat Plenty of vegetables, Mushrooms, fruits, Legumes, Whole Grains, Nuts, seeds in lieu of processed meats, processed snacks/pastries red meat, poultry, eggs as recommended by American College of  Lifestyle Medicine (ACLM).  -Type 2 diabetes is known to coexist with other important comorbidities such as high blood pressure and high cholesterol.  It is critical to control not only the  diabetes but also the high blood pressure and high cholesterol to minimize and delay the risk of complications including coronary artery disease, stroke, amputations, blindness, etc.  The good news is that this diet recommendation for type 2 diabetes is also very helpful for managing high cholesterol and high blood blood pressure.  - Studies showed that people with diabetes will benefit from a class of medications known as ACE inhibitors and statins.  Unless there are specific reasons not to be on these medications, the standard of care is to consider getting one from these groups of medications at an optimal doses.  These medications are generally considered safe and proven to help protect the heart and the kidneys.    - People with diabetes are encouraged to initiate and maintain regular follow-up with eye doctors, foot doctors, dentists , and if necessary heart and kidney doctors.     - It is highly recommended that people with diabetes quit smoking or stay away from smoking, and get yearly  flu vaccine and pneumonia vaccine at least every 5 years.  See above for additional recommendations on exercise, sleep, stress management , and healthy social connections.      

## 2022-11-17 DIAGNOSIS — G894 Chronic pain syndrome: Secondary | ICD-10-CM | POA: Diagnosis not present

## 2022-11-29 NOTE — Progress Notes (Unsigned)
Referring Provider: [name], MD Primary Care Physician:  [name], MD Primary GI Physician: Dr. Jena Gauss  Chief Complaint  Patient presents with   Follow-up    Doing well    HPI:   [name] is a 57 y.o. male with history of chronic constipation, adenomatous colon polyps due for surveillance colonoscopy in December 2024 presenting today for 1 year follow-up.   Last seen in our office 11/25/2021.  He was doing well, taking Linzess 72 mcg as needed if skipping 1-2 days between bowel movements.  This was less than weekly.  Previously tried over-the-counter medications including stool softeners, MiraLAX which were not helpful and preferred to continue Linzess as needed.  Denied any other significant GI symptoms.   Today: Doing well overall.  No concerns to me today.  Constipation is well-controlled with Linzess 72 mcg as needed.  Denies BRBPR, melena, abdominal pain, nausea, vomiting, reflux symptoms, dysphagia, unintentional weight loss.   Past Medical History:  Diagnosis Date   Anxiety    Borderline diabetes    Bulging disc    Chronic back pain    Closed intra-articular fracture of distal end of left tibia    Diabetes mellitus without complication (HCC)    HTN (hypertension)    Hypercholesteremia    Myocardial infarction [facility])    STEMI 2015 with BMS to RCA   Pancreatitis     Past Surgical History:  Procedure Laterality Date   ABDOMINAL SURGERY  1990   adhesions, blockage.    APPENDECTOMY     BACK SURGERY     CARDIAC CATHETERIZATION  2003   "trivial coronary artery disease," normal EF   COLONOSCOPY N/A 04/16/2015   Procedure: COLONOSCOPY;  Surgeon: Corbin Ade, MD;  [facility]: [facility];  Service: Endoscopy;  Laterality: N/A;  12:45 PM   COLONOSCOPY WITH PROPOFOL N/A 06/28/2018   Rourk: Diverticulosis, 2 tubular adenomas removed.  Next colonoscopy in 5 years.   LEFT HEART CATH AND CORONARY ANGIOGRAPHY N/A 05/09/2017   Procedure: LEFT HEART CATH AND  CORONARY ANGIOGRAPHY;  Surgeon: Marykay Lex, MD;  Location: [facility] INVASIVE CV LAB;  Service: Cardiovascular;  Laterality: N/A;   LEFT HEART CATHETERIZATION WITH CORONARY ANGIOGRAM N/A 03/13/2014   Procedure: LEFT HEART CATHETERIZATION WITH CORONARY ANGIOGRAM;  Surgeon: Marykay Lex, MD;  Location: [facility] CATH LAB;  Service: Cardiovascular;  Laterality: N/A;   OPEN REDUCTION INTERNAL FIXATION (ORIF) TIBIA/FIBULA FRACTURE Left 10/01/2018   Procedure: OPEN REDUCTION INTERNAL FIXATION LEFT PILON FRACTURE;  Surgeon: Myrene Galas, MD;  Location: [facility] OR;  Service: Orthopedics;  Laterality: Left;   POLYPECTOMY  06/28/2018   Procedure: POLYPECTOMY;  Surgeon: Corbin Ade, MD;  Location: [facility];  Service: Endoscopy;;  (colon)    Current Outpatient Medications  Medication Sig Dispense Refill   Accu-Chek Softclix Lancets lancets Use as instructed 100 each 3   albuterol (PROVENTIL) (2.5 MG/3ML) 0.083% nebulizer solution Take 3 mLs (2.5 mg total) by nebulization every 6 (six) hours as needed for wheezing or shortness of breath. 75 mL 0   Albuterol Sulfate (PROAIR HFA IN) Inhale 2 puffs into the lungs as needed.     aspirin 81 MG chewable tablet Chew 1 tablet (81 mg total) by mouth daily.     atorvastatin (LIPITOR) 80 MG tablet TAKE 1 TABLET(80 MG) BY MOUTH AT BEDTIME 90 tablet 3   Blood Glucose Monitoring Suppl (ACCU-CHEK GUIDE ME) w/Device KIT 1 Piece by Does not apply route as directed. 1 kit  0   CRANBERRY PO Take 1 tablet by mouth every 14 (fourteen) days.     furosemide (LASIX) 20 MG tablet Take 1 tablet (20 mg total) by mouth daily as needed. 90 tablet 3   glucose blood (ACCU-CHEK GUIDE) test strip USE TO MONITOR GLUCOSE TWICE DAILY 100 strip 1   icosapent Ethyl (VASCEPA) 1 g capsule Take 2 capsules (2 g total) by mouth 2 (two) times daily. 120 capsule 6   Insulin Pen Needle 31G X 5 MM MISC Use to inject insulin 100 each 1   isosorbide mononitrate (IMDUR) 60 MG 24 hr tablet Take 1 tablet (60 mg  total) by mouth daily. 90 tablet 3   LANTUS SOLOSTAR 100 UNIT/ML Solostar Pen ADMINISTER 50 UNITS UNDER THE SKIN AT BEDTIME 15 mL 0   LINZESS 72 MCG capsule TAKE 1 CAPSULE(72 MCG) BY MOUTH DAILY BEFORE BREAKFAST 30 capsule 5   lisinopril (ZESTRIL) 2.5 MG tablet TAKE 1 TABLET(2.5 MG) BY MOUTH DAILY 90 tablet 3   metFORMIN (GLUCOPHAGE) 1000 MG tablet Take 1,000 mg by mouth 2 (two) times daily.     Multiple Vitamin (MULTIVITAMIN) tablet Take 1 tablet by mouth daily.     nitroGLYCERIN (NITROSTAT) 0.4 MG SL tablet Place 1 tablet (0.4 mg total) under the tongue every 5 (five) minutes x 3 doses as needed for chest pain (if no relief after 2nd dose, proceed to the ED or call 911). 25 tablet 3   Omega-3 Fatty Acids (SALMON OIL-1000 PO) Take 1 capsule by mouth daily.     oxyCODONE-acetaminophen (PERCOCET) 5-325 MG tablet Take 1 tablet by mouth every 6 (six) hours as needed. 20 tablet 0   zolpidem (AMBIEN) 10 MG tablet Take 10 mg by mouth at bedtime as needed.     No current facility-administered medications for this visit.    Allergies as of 12/01/2022   (No Known Allergies)    Family History  Problem Relation Age of Onset   Hypertension Mother    Hyperlipidemia Mother    Diabetes Father    Hyperlipidemia Father    Hypertension Father    Hyperlipidemia Sister    Hypertension Sister    Diabetes Sister    Colon cancer Paternal Uncle        over age 83    Social History   Socioeconomic History   Marital status: Single    Spouse name: Not on file   Number of children: Not on file   Years of education: Not on file   Highest education level: Not on file  Occupational History   Not on file  Tobacco Use   Smoking status: Every Day    Packs/day: 0.50    Years: 30.00    Additional pack years: 0.00    Total pack years: 15.00    Types: Cigarettes    Start date: 10/25/1977   Smokeless tobacco: Never  Vaping Use   Vaping Use: Never used  Substance and Sexual Activity   Alcohol use: Not  Currently    Alcohol/week: 6.0 standard drinks of alcohol    Types: 6 Cans of beer per week    Comment: denied 06/08/21   Drug use: No   Sexual activity: Yes  Other Topics Concern   Not on file  Social History Narrative   Not on file   Social Determinants of Health   Financial Resource Strain: Not on file  Food Insecurity: Not on file  Transportation Needs: Not on file  Physical Activity: Not on file  Stress: Not on file  Social Connections: Not on file    Review of Systems: Gen: Denies fever, chills, cold or flulike symptoms, presyncope, syncope. CV: Denies chest pain, palpitations. Resp: Denies dyspnea, cough.  GI: See HPI Heme:  See HPI  Physical Exam: BP 109/71 (BP Location: Right Arm, Patient Position: Sitting, Cuff Size: Large)   Pulse 73   Temp 97.8 F (36.6 C) (Oral)   Ht 6\' 1"  (1.854 m)   Wt 212 lb 12.8 oz (96.5 kg)   SpO2 94%   BMI 28.08 kg/m  General:   Alert and oriented. No distress noted. Pleasant and cooperative.  Head:  Normocephalic and atraumatic. Eyes:  Conjuctiva clear without scleral icterus. Heart:  S1, S2 present without murmurs appreciated. Lungs:  Clear to auscultation bilaterally. No wheezes, rales, or rhonchi. No distress.  Abdomen:  +BS, soft, non-tender and non-distended. No rebound or guarding. No HSM or masses noted. Msk:  Symmetrical without gross deformities. Normal posture. Extremities:  Without edema. Neurologic:  Alert and  oriented x4 Psych:  Normal mood and affect.    Assessment:  57 year old male with history of anxiety, diabetes, chronic back pain, HTN, HLD, MI, pancreatitis, chronic constipation, adenomatous colon polyps, presenting today for routine follow-up of constipation.  Chronic constipation: Well-controlled with Linzess 72 mcg as needed.  No alarm symptoms.  History of colon polyps:  Due for surveillance December 2024.   Plan:  Continue Linzess 72 mcg as needed. Follow-up in 6 months to discuss scheduling  surveillance colonoscopy.   Ermalinda Memos, PA-C [facility] Gastroenterology 12/01/2022

## 2022-12-01 ENCOUNTER — Encounter: Payer: Self-pay | Admitting: Gastroenterology

## 2022-12-01 ENCOUNTER — Ambulatory Visit (INDEPENDENT_AMBULATORY_CARE_PROVIDER_SITE_OTHER): Payer: 59 | Admitting: Gastroenterology

## 2022-12-01 VITALS — BP 109/71 | HR 73 | Temp 97.8°F | Ht 73.0 in | Wt 212.8 lb

## 2022-12-01 DIAGNOSIS — K5900 Constipation, unspecified: Secondary | ICD-10-CM | POA: Diagnosis not present

## 2022-12-01 DIAGNOSIS — Z86010 Personal history of colonic polyps: Secondary | ICD-10-CM

## 2022-12-01 NOTE — Patient Instructions (Addendum)
Continue taking Linzess 72 mcg as needed for constipation.  Will plan to follow-up with you in 6 months to discuss scheduling your colonoscopy.  It was great to see you again today!  I am glad you are doing well!  Ermalinda Memos, PA-C [facility] Gastroenterology

## 2022-12-02 ENCOUNTER — Other Ambulatory Visit: Payer: Self-pay | Admitting: "Endocrinology

## 2022-12-19 DIAGNOSIS — E119 Type 2 diabetes mellitus without complications: Secondary | ICD-10-CM | POA: Diagnosis not present

## 2022-12-19 DIAGNOSIS — G894 Chronic pain syndrome: Secondary | ICD-10-CM | POA: Diagnosis not present

## 2023-01-02 ENCOUNTER — Ambulatory Visit: Payer: 59 | Attending: Cardiology | Admitting: Cardiology

## 2023-01-02 ENCOUNTER — Encounter: Payer: Self-pay | Admitting: Cardiology

## 2023-01-02 VITALS — BP 122/80 | HR 104 | Ht 73.0 in | Wt 213.8 lb

## 2023-01-02 DIAGNOSIS — I2510 Atherosclerotic heart disease of native coronary artery without angina pectoris: Secondary | ICD-10-CM | POA: Diagnosis not present

## 2023-01-02 DIAGNOSIS — E782 Mixed hyperlipidemia: Secondary | ICD-10-CM | POA: Diagnosis not present

## 2023-01-02 NOTE — Progress Notes (Signed)
Clinical Summary [name] is a 57 y.o.male seen today for follow up of the following medical problems.    1. CAD - admit 03/2014 with inferior STEMI, received BMS to RCA. LVEF 45-50% by LV gram. 04/2017 cath: patent coronaries, patent RCA stent. Elevated LVEDP 26  - 03/2020 nuclear stress: no ischemia     - no chest pains, no SOB/DOE  - compliant with meds   2. Hyperlipidemia - tolerates pravastatin, reports prior myaglias on prior statins - elevated TGs by last labs. Was to have repeat labs but does not appear this was done. Most recent labs he reports he had coffee with whole milk prior to labs, was not a true fasting lab - muscle aches on gemfibrozil in the past, tolerated fenofibrate but had some financial issues with costs of medicines about 5 years ago and medication was stopped      Jan 2023 TG 224, LDL 111 03/2022 TC 178 TG 425 HDL 36 LDL 75 03/2022 A1c 8.2   -vascepa started 08/2022 -08/2022 TC 172 TG 548 HDL 28 LDL 61 - 10/2022 HgbA1c 7.9 Past Medical History:  Diagnosis Date   Anxiety    Borderline diabetes    Bulging disc    Chronic back pain    Closed intra-articular fracture of distal end of left tibia    Diabetes mellitus without complication (HCC)    HTN (hypertension)    Hypercholesteremia    Myocardial infarction [facility])    STEMI 2015 with BMS to RCA   Pancreatitis      No Known Allergies   Current Outpatient Medications  Medication Sig Dispense Refill   Accu-Chek Softclix Lancets lancets Use as instructed 100 each 3   albuterol (PROVENTIL) (2.5 MG/3ML) 0.083% nebulizer solution Take 3 mLs (2.5 mg total) by nebulization every 6 (six) hours as needed for wheezing or shortness of breath. 75 mL 0   Albuterol Sulfate (PROAIR HFA IN) Inhale 2 puffs into the lungs as needed.     aspirin 81 MG chewable tablet Chew 1 tablet (81 mg total) by mouth daily.     atorvastatin (LIPITOR) 80 MG tablet TAKE 1 TABLET(80 MG) BY MOUTH AT BEDTIME 90 tablet 3   Blood  Glucose Monitoring Suppl (ACCU-CHEK GUIDE ME) w/Device KIT 1 Piece by Does not apply route as directed. 1 kit 0   CRANBERRY PO Take 1 tablet by mouth every 14 (fourteen) days.     furosemide (LASIX) 20 MG tablet Take 1 tablet (20 mg total) by mouth daily as needed. 90 tablet 3   glucose blood (ACCU-CHEK GUIDE) test strip USE TO MONITOR GLUCOSE TWICE DAILY 100 strip 1   icosapent Ethyl (VASCEPA) 1 g capsule Take 2 capsules (2 g total) by mouth 2 (two) times daily. 120 capsule 6   Insulin Pen Needle 31G X 5 MM MISC Use to inject insulin 100 each 1   isosorbide mononitrate (IMDUR) 60 MG 24 hr tablet Take 1 tablet (60 mg total) by mouth daily. 90 tablet 3   LANTUS SOLOSTAR 100 UNIT/ML Solostar Pen ADMINISTER 50 UNITS UNDER THE SKIN AT BEDTIME 45 mL 0   LINZESS 72 MCG capsule TAKE 1 CAPSULE(72 MCG) BY MOUTH DAILY BEFORE BREAKFAST 30 capsule 5   lisinopril (ZESTRIL) 2.5 MG tablet TAKE 1 TABLET(2.5 MG) BY MOUTH DAILY 90 tablet 3   metFORMIN (GLUCOPHAGE) 1000 MG tablet Take 1,000 mg by mouth 2 (two) times daily.     Multiple Vitamin (MULTIVITAMIN) tablet Take 1 tablet  by mouth daily.     nitroGLYCERIN (NITROSTAT) 0.4 MG SL tablet Place 1 tablet (0.4 mg total) under the tongue every 5 (five) minutes x 3 doses as needed for chest pain (if no relief after 2nd dose, proceed to the ED or call 911). 25 tablet 3   Omega-3 Fatty Acids (SALMON OIL-1000 PO) Take 1 capsule by mouth daily.     oxyCODONE-acetaminophen (PERCOCET) 5-325 MG tablet Take 1 tablet by mouth every 6 (six) hours as needed. 20 tablet 0   zolpidem (AMBIEN) 10 MG tablet Take 10 mg by mouth at bedtime as needed.     No current facility-administered medications for this visit.     Past Surgical History:  Procedure Laterality Date   ABDOMINAL SURGERY  1990   adhesions, blockage.    APPENDECTOMY     BACK SURGERY     CARDIAC CATHETERIZATION  2003   "trivial coronary artery disease," normal EF   COLONOSCOPY N/A 04/16/2015   Procedure:  COLONOSCOPY;  Surgeon: Corbin Ade, MD;  [facility]: [facility];  Service: Endoscopy;  Laterality: N/A;  12:45 PM   COLONOSCOPY WITH PROPOFOL N/A 06/28/2018   Rourk: Diverticulosis, 2 tubular adenomas removed.  Next colonoscopy in 5 years.   LEFT HEART CATH AND CORONARY ANGIOGRAPHY N/A 05/09/2017   Procedure: LEFT HEART CATH AND CORONARY ANGIOGRAPHY;  Surgeon: Marykay Lex, MD;  Location: [facility] INVASIVE CV LAB;  Service: Cardiovascular;  Laterality: N/A;   LEFT HEART CATHETERIZATION WITH CORONARY ANGIOGRAM N/A 03/13/2014   Procedure: LEFT HEART CATHETERIZATION WITH CORONARY ANGIOGRAM;  Surgeon: Marykay Lex, MD;  Location: [facility] CATH LAB;  Service: Cardiovascular;  Laterality: N/A;   OPEN REDUCTION INTERNAL FIXATION (ORIF) TIBIA/FIBULA FRACTURE Left 10/01/2018   Procedure: OPEN REDUCTION INTERNAL FIXATION LEFT PILON FRACTURE;  Surgeon: Myrene Galas, MD;  Location: [facility] OR;  Service: Orthopedics;  Laterality: Left;   POLYPECTOMY  06/28/2018   Procedure: POLYPECTOMY;  Surgeon: Corbin Ade, MD;  [facility]: [facility];  Service: Endoscopy;;  (colon)     No Known Allergies    Family History  Problem Relation Age of Onset   Hypertension Mother    Hyperlipidemia Mother    Diabetes Father    Hyperlipidemia Father    Hypertension Father    Hyperlipidemia Sister    Hypertension Sister    Diabetes Sister    Colon cancer Paternal Uncle        over age 27     Social History [name] reports that he has been smoking cigarettes. He started smoking about 45 years ago. He has a 15.00 pack-year smoking history. He has never used smokeless tobacco. [name] reports that he does not currently use alcohol after a past usage of about 6.0 standard drinks of alcohol per week.   Review of Systems CONSTITUTIONAL: No weight loss, fever, chills, weakness or fatigue.  HEENT: Eyes: No visual loss, blurred vision, double vision or yellow sclerae.No hearing loss, sneezing, congestion, runny nose or  sore throat.  SKIN: No rash or itching.  CARDIOVASCULAR: per hpi RESPIRATORY: No shortness of breath, cough or sputum.  GASTROINTESTINAL: No anorexia, nausea, vomiting or diarrhea. No abdominal pain or blood.  GENITOURINARY: No burning on urination, no polyuria NEUROLOGICAL: No headache, dizziness, syncope, paralysis, ataxia, numbness or tingling in the extremities. No change in bowel or bladder control.  MUSCULOSKELETAL: No muscle, back pain, joint pain or stiffness.  LYMPHATICS: No enlarged nodes. No history of splenectomy.  PSYCHIATRIC: No history of depression or  anxiety.  ENDOCRINOLOGIC: No reports of sweating, cold or heat intolerance. No polyuria or polydipsia.  Marland Kitchen   Physical Examination Today's Vitals   01/02/23 1445  BP: 122/80  Pulse: (!) 104  SpO2: 97%  Weight: 213 lb 12.8 oz (97 kg)  Height: 6\' 1"  (1.854 m)   Body mass index is 28.21 kg/m.  Gen: resting comfortably, no acute distress HEENT: no scleral icterus, pupils equal round and reactive, no palptable cervical adenopathy,  CV: RRR, no mrg, no jvd Resp: Clear to auscultation bilaterally GI: abdomen is soft, non-tender, non-distended, normal bowel sounds, no hepatosplenomegaly MSK: extremities are warm, no edema.  Skin: warm, no rash Neuro:  no focal deficits Psych: appropriate affect   Diagnostic Studies 02/2017 nuclear stress No diagnostic ST segment changes to indicate ischemia. Small, mild intensity, apical to basal inferior defect is partially reversible and suggestive of ischemia, although there is gut radiotracer uptake artifact adjacent to the inferior wall on rest imaging which could also be contributing to the defect reversibility. This is a low risk study. Nuclear stress EF: 63%.     04/2017 cath   Angiographically normal coronary arteries Dist RCA BMS (VeriFlex 5.0 x 15), 0 %stenosed. The left ventricular systolic function is normal. The left ventricular ejection fraction is 50-55% by visual  estimate. LV end diastolic pressure is moderately elevated.   Patient essentially has intra-graphic normal coronary arteries with widely RCA.  No culprit lesion to explain inferior ischemia. Elevated LVEDP could explain microvascular ischemia.   Otherwise consider non-anginal etiology for chest pain.     Plan: Discharge home today after bed rest with TR band removal.   03/2020 nuclear stress No diagnostic ST segment changes to indicate ischemia. Medium sized, moderate intensity, fixed apical to basal inferior defect that is more prominent at rest and consistent with soft tissue attenuation versus scar. No substantial ischemic territories. This is a low risk study. Nuclear stress EF: 51%.    Assessment and Plan  1. CAD -no recent symptoms, continue current meds   2. Hyperlipidemia -LDL at goal. We recently started vascepa for high TGs, has upcoming with labs with endocrine in next month. F/u results.       Antoine Poche, M.D.

## 2023-01-02 NOTE — Patient Instructions (Signed)
Medication Instructions:  Continue all current medications.   Labwork: none  Testing/Procedures: none  Follow-Up: 6 months   Any Other Special Instructions Will Be Listed Below (If Applicable).   If you need a refill on your cardiac medications before your next appointment, please call your pharmacy.  

## 2023-01-11 DIAGNOSIS — M75102 Unspecified rotator cuff tear or rupture of left shoulder, not specified as traumatic: Secondary | ICD-10-CM | POA: Diagnosis not present

## 2023-01-11 DIAGNOSIS — M75101 Unspecified rotator cuff tear or rupture of right shoulder, not specified as traumatic: Secondary | ICD-10-CM | POA: Diagnosis not present

## 2023-01-11 DIAGNOSIS — M7581 Other shoulder lesions, right shoulder: Secondary | ICD-10-CM | POA: Diagnosis not present

## 2023-01-19 ENCOUNTER — Other Ambulatory Visit: Payer: Self-pay | Admitting: Cardiology

## 2023-01-24 DIAGNOSIS — G4700 Insomnia, unspecified: Secondary | ICD-10-CM | POA: Diagnosis not present

## 2023-01-24 DIAGNOSIS — G894 Chronic pain syndrome: Secondary | ICD-10-CM | POA: Diagnosis not present

## 2023-02-20 DIAGNOSIS — G894 Chronic pain syndrome: Secondary | ICD-10-CM | POA: Diagnosis not present

## 2023-03-01 ENCOUNTER — Other Ambulatory Visit: Payer: Self-pay | Admitting: "Endocrinology

## 2023-03-03 DIAGNOSIS — E782 Mixed hyperlipidemia: Secondary | ICD-10-CM | POA: Diagnosis not present

## 2023-03-03 DIAGNOSIS — E1159 Type 2 diabetes mellitus with other circulatory complications: Secondary | ICD-10-CM | POA: Diagnosis not present

## 2023-03-04 LAB — T4, FREE: Free T4: 1.11 ng/dL (ref 0.82–1.77)

## 2023-03-06 ENCOUNTER — Ambulatory Visit (INDEPENDENT_AMBULATORY_CARE_PROVIDER_SITE_OTHER): Payer: 59 | Admitting: "Endocrinology

## 2023-03-06 ENCOUNTER — Encounter: Payer: Self-pay | Admitting: "Endocrinology

## 2023-03-06 ENCOUNTER — Other Ambulatory Visit: Payer: Self-pay | Admitting: "Endocrinology

## 2023-03-06 VITALS — BP 114/82 | HR 60 | Ht 73.0 in | Wt 209.6 lb

## 2023-03-06 DIAGNOSIS — Z794 Long term (current) use of insulin: Secondary | ICD-10-CM | POA: Diagnosis not present

## 2023-03-06 DIAGNOSIS — E1159 Type 2 diabetes mellitus with other circulatory complications: Secondary | ICD-10-CM | POA: Diagnosis not present

## 2023-03-06 DIAGNOSIS — I10 Essential (primary) hypertension: Secondary | ICD-10-CM | POA: Diagnosis not present

## 2023-03-06 DIAGNOSIS — F17200 Nicotine dependence, unspecified, uncomplicated: Secondary | ICD-10-CM | POA: Diagnosis not present

## 2023-03-06 DIAGNOSIS — E782 Mixed hyperlipidemia: Secondary | ICD-10-CM

## 2023-03-06 LAB — POCT GLYCOSYLATED HEMOGLOBIN (HGB A1C): HbA1c, POC (controlled diabetic range): 7.4 % — AB (ref 0.0–7.0)

## 2023-03-06 MED ORDER — FREESTYLE LIBRE 3 SENSOR MISC: 1.0000 | 2 refills | Status: DC

## 2023-03-06 MED ORDER — FREESTYLE LIBRE 3 READER DEVI: 1.0000 | Freq: Once | 0 refills | Status: DC | PRN

## 2023-03-06 NOTE — Progress Notes (Signed)
03/06/2023, 3:44 PM  Endocrinology follow-up note   Subjective:    Patient ID: [name], male    DOB: Nov 28, 1965.  [name] is being seen in follow-up after he was seen in follow-up after he was seen in consultation for management of currently uncontrolled symptomatic diabetes requested by  [name], MD.   Past Medical History:  Diagnosis Date   Anxiety    Borderline diabetes    Bulging disc    Chronic back pain    Closed intra-articular fracture of distal end of left tibia    Diabetes mellitus without complication (HCC)    HTN (hypertension)    Hypercholesteremia    Myocardial infarction [facility])    STEMI 2015 with BMS to RCA   Pancreatitis     Past Surgical History:  Procedure Laterality Date   ABDOMINAL SURGERY  1990   adhesions, blockage.    APPENDECTOMY     BACK SURGERY     CARDIAC CATHETERIZATION  2003   "trivial coronary artery disease," normal EF   COLONOSCOPY N/A 04/16/2015   Procedure: COLONOSCOPY;  Surgeon: Corbin Ade, MD;  Location: [facility];  Service: Endoscopy;  Laterality: N/A;  12:45 PM   COLONOSCOPY WITH PROPOFOL N/A 06/28/2018   Rourk: Diverticulosis, 2 tubular adenomas removed.  Next colonoscopy in 5 years.   LEFT HEART CATH AND CORONARY ANGIOGRAPHY N/A 05/09/2017   Procedure: LEFT HEART CATH AND CORONARY ANGIOGRAPHY;  Surgeon: Marykay Lex, MD;  Location: [facility] INVASIVE CV LAB;  Service: Cardiovascular;  Laterality: N/A;   LEFT HEART CATHETERIZATION WITH CORONARY ANGIOGRAM N/A 03/13/2014   Procedure: LEFT HEART CATHETERIZATION WITH CORONARY ANGIOGRAM;  Surgeon: Marykay Lex, MD;  Location: [facility] CATH LAB;  Service: Cardiovascular;  Laterality: N/A;   OPEN REDUCTION INTERNAL FIXATION (ORIF) TIBIA/FIBULA FRACTURE Left 10/01/2018   Procedure: OPEN REDUCTION INTERNAL FIXATION LEFT PILON FRACTURE;  Surgeon: Myrene Galas, MD;  Location: [facility] OR;  Service: Orthopedics;  Laterality: Left;   POLYPECTOMY  06/28/2018    Procedure: POLYPECTOMY;  Surgeon: Corbin Ade, MD;  Location: [facility];  Service: Endoscopy;;  (colon)    Social History   Socioeconomic History   Marital status: Single    Spouse name: Not on file   Number of children: Not on file   Years of education: Not on file   Highest education level: Not on file  Occupational History   Not on file  Tobacco Use   Smoking status: Every Day    Current packs/day: 0.50    Average packs/day: 0.5 packs/day for 45.4 years (22.7 ttl pk-yrs)    Types: Cigarettes    Start date: 10/25/1977   Smokeless tobacco: Never  Vaping Use   Vaping status: Never Used  Substance and Sexual Activity   Alcohol use: Not Currently    Alcohol/week: 6.0 standard drinks of alcohol    Types: 6 Cans of beer per week    Comment: denied 06/08/21   Drug use: No   Sexual activity: Yes  Other Topics Concern   Not on file  Social History Narrative   Not on file   Social Determinants of Health   Financial Resource Strain: Not on file  Food Insecurity: Not on file  Transportation Needs: Not on file  Physical Activity: Not on file  Stress: Not on file  Social Connections: Not on file    Family History  Problem Relation Age of Onset   Hypertension Mother  Hyperlipidemia Mother    Diabetes Father    Hyperlipidemia Father    Hypertension Father    Hyperlipidemia Sister    Hypertension Sister    Diabetes Sister    Colon cancer Paternal Uncle        over age 58    Outpatient Encounter Medications as of 03/06/2023  Medication Sig   Continuous Glucose Receiver (FREESTYLE LIBRE 3 READER) DEVI 1 Piece by Does not apply route once as needed for up to 1 dose.   Continuous Glucose Sensor (FREESTYLE LIBRE 3 SENSOR) MISC 1 Piece by Does not apply route every 14 (fourteen) days. Place 1 sensor on the skin every 14 days. Use to check glucose continuously   Accu-Chek Softclix Lancets lancets Use as instructed   albuterol (PROVENTIL) (2.5 MG/3ML) 0.083% nebulizer  solution Take 3 mLs (2.5 mg total) by nebulization every 6 (six) hours as needed for wheezing or shortness of breath.   Albuterol Sulfate (PROAIR HFA IN) Inhale 2 puffs into the lungs as needed.   aspirin 81 MG chewable tablet Chew 1 tablet (81 mg total) by mouth daily.   atorvastatin (LIPITOR) 80 MG tablet TAKE 1 TABLET(80 MG) BY MOUTH AT BEDTIME   Blood Glucose Monitoring Suppl (ACCU-CHEK GUIDE ME) w/Device KIT 1 Piece by Does not apply route as directed.   CRANBERRY PO Take 1 tablet by mouth every 14 (fourteen) days.   furosemide (LASIX) 20 MG tablet Take 1 tablet (20 mg total) by mouth daily as needed.   glucose blood (ACCU-CHEK GUIDE) test strip USE TO MONITOR GLUCOSE TWICE DAILY   icosapent Ethyl (VASCEPA) 1 g capsule Take 2 capsules (2 g total) by mouth 2 (two) times daily.   Insulin Pen Needle 31G X 5 MM MISC Use to inject insulin   isosorbide mononitrate (IMDUR) 60 MG 24 hr tablet Take 1 tablet (60 mg total) by mouth daily.   LANTUS SOLOSTAR 100 UNIT/ML Solostar Pen ADMINISTER 50 UNITS UNDER THE SKIN AT BEDTIME   LINZESS 72 MCG capsule TAKE 1 CAPSULE(72 MCG) BY MOUTH DAILY BEFORE BREAKFAST   lisinopril (ZESTRIL) 2.5 MG tablet TAKE 1 TABLET(2.5 MG) BY MOUTH DAILY   metFORMIN (GLUCOPHAGE) 1000 MG tablet Take 1,000 mg by mouth 2 (two) times daily.   Multiple Vitamin (MULTIVITAMIN) tablet Take 1 tablet by mouth daily.   nitroGLYCERIN (NITROSTAT) 0.4 MG SL tablet Place 1 tablet (0.4 mg total) under the tongue every 5 (five) minutes x 3 doses as needed for chest pain (if no relief after 2nd dose, proceed to the ED or call 911).   Omega-3 Fatty Acids (SALMON OIL-1000 PO) Take 1 capsule by mouth daily.   oxyCODONE-acetaminophen (PERCOCET) 5-325 MG tablet Take 1 tablet by mouth every 6 (six) hours as needed.   zolpidem (AMBIEN) 10 MG tablet Take 10 mg by mouth at bedtime as needed.   No facility-administered encounter medications on file as of 03/06/2023.    ALLERGIES: No Known  Allergies  VACCINATION STATUS: Immunization History  Administered Date(s) Administered   Influenza,inj,Quad PF,6+ Mos 05/27/2014   Tdap 05/29/2021    Diabetes He presents for his follow-up diabetic visit. He has type 2 diabetes mellitus. Onset time: He was diagnosed at approximate age of 49 years. His disease course has been fluctuating. There are no hypoglycemic associated symptoms. Pertinent negatives for hypoglycemia include no confusion, headaches, pallor or seizures. Pertinent negatives for diabetes include no blurred vision, no chest pain, no fatigue, no polydipsia, no polyphagia, no polyuria and no weakness. There are  no hypoglycemic complications. Symptoms are worsening. Diabetic complications include heart disease. (He is status post stent placement in 1 large coronary artery.) Risk factors for coronary artery disease include dyslipidemia, diabetes mellitus, family history, hypertension, male sex, sedentary lifestyle and tobacco exposure. Current diabetic treatment includes insulin injections (He is currently on Lantus 14 units nightly, metformin 1000 mg p.o. twice daily.). His weight is fluctuating minimally. He is following a generally unhealthy diet. When asked about meal planning, he reported none. He has not had a previous visit with a dietitian. He never participates in exercise. His home blood glucose trend is increasing steadily. His overall blood glucose range is 140-180 mg/dl. (He does not monitor blood glucose nearly enough.  Presently 15 readings in the last 90 days.  At this point okay lysing 7.4%, progressively improving.    He did not document hypoglycemia. ) An ACE inhibitor/angiotensin II receptor blocker is being taken. Eye exam is current.  Hyperlipidemia This is a chronic problem. The problem is resistant. Exacerbating diseases include diabetes. Pertinent negatives include no chest pain, myalgias or shortness of breath. Current antihyperlipidemic treatment includes statins.  Risk factors for coronary artery disease include dyslipidemia, diabetes mellitus, family history, male sex, hypertension and a sedentary lifestyle.  Hypertension This is a chronic problem. The current episode started more than 1 year ago. Pertinent negatives include no blurred vision, chest pain, headaches, neck pain, palpitations or shortness of breath. Risk factors for coronary artery disease include dyslipidemia, diabetes mellitus, smoking/tobacco exposure, sedentary lifestyle, male gender and family history. Past treatments include ACE inhibitors.     Review of Systems  Constitutional:  Negative for chills, fatigue, fever and unexpected weight change.  HENT:  Negative for dental problem, mouth sores and trouble swallowing.   Eyes:  Negative for blurred vision and visual disturbance.  Respiratory:  Negative for cough, choking, chest tightness, shortness of breath and wheezing.   Cardiovascular:  Negative for chest pain, palpitations and leg swelling.  Gastrointestinal:  Negative for abdominal distention, abdominal pain, constipation, diarrhea, nausea and vomiting.  Endocrine: Negative for polydipsia, polyphagia and polyuria.  Genitourinary:  Negative for dysuria, flank pain, hematuria and urgency.  Musculoskeletal:  Negative for back pain, gait problem, myalgias and neck pain.  Skin:  Negative for pallor, rash and wound.  Neurological:  Negative for seizures, syncope, weakness, numbness and headaches.  Psychiatric/Behavioral:  Negative for confusion and dysphoric mood.     Objective:       03/06/2023    3:11 PM 01/02/2023    2:45 PM 12/01/2022   10:14 AM  Vitals with BMI  Height 6\' 1"  6\' 1"  6\' 1"   Weight 209 lbs 10 oz 213 lbs 13 oz 212 lbs 13 oz  BMI 27.66 28.21 28.08  Systolic 114 122 161  Diastolic 82 80 71  Pulse 60 104 73    BP 114/82   Pulse 60   Ht 6\' 1"  (1.854 m)   Wt 209 lb 9.6 oz (95.1 kg)   BMI 27.65 kg/m   Wt Readings from Last 3 Encounters:  03/06/23 209 lb 9.6  oz (95.1 kg)  01/02/23 213 lb 12.8 oz (97 kg)  12/01/22 212 lb 12.8 oz (96.5 kg)      CMP ( most recent) CMP     Component Value Date/Time   NA 140 03/03/2023 1156   K 4.3 03/03/2023 1156   CL 101 03/03/2023 1156   CO2 21 03/03/2023 1156   GLUCOSE 150 (H) 03/03/2023 1156   GLUCOSE 134 (  H) 05/29/2021 1635   BUN 11 03/03/2023 1156   CREATININE 0.78 03/03/2023 1156   CALCIUM 9.0 03/03/2023 1156   PROT 6.4 03/03/2023 1156   ALBUMIN 4.2 03/03/2023 1156   AST 15 03/03/2023 1156   ALT 18 03/03/2023 1156   ALKPHOS 82 03/03/2023 1156   BILITOT 0.3 03/03/2023 1156   GFRNONAA >60 05/29/2021 1620   GFRAA >60 02/06/2020 0035     Diabetic Labs (most recent): Lab Results  Component Value Date   HGBA1C 7.4 (A) 03/06/2023   HGBA1C 7.9 (A) 11/02/2022   HGBA1C 8.2 (A) 04/07/2022     Lipid Panel ( most recent) Lipid Panel     Component Value Date/Time   CHOL 183 03/03/2023 1156   TRIG 403 (H) 03/03/2023 1156   HDL 31 (L) 03/03/2023 1156   CHOLHDL 5.9 (H) 03/03/2023 1156   CHOLHDL 17.3 01/20/2014 0528   VLDL UNABLE TO CALCULATE IF TRIGLYCERIDE OVER 400 mg/dL 16/04/9603 5409   LDLCALC 86 03/03/2023 1156   LABVLDL 66 (H) 03/03/2023 1156      Lab Results  Component Value Date   TSH 1.280 03/03/2023   TSH 1.08 08/22/2022   TSH 1.630 03/25/2022   TSH 1.36 02/24/2022   TSH 1.120 07/26/2021   TSH 1.100 01/19/2014   FREET4 1.11 03/03/2023   FREET4 1.15 03/25/2022   FREET4 1.37 07/26/2021      Assessment & Plan:   1. Type 2 diabetes mellitus with vascular disease (HCC)   - Fayez D Kozloski has currently uncontrolled symptomatic type 2 DM since  57 years of age.  He does not monitor blood glucose nearly enough.  Presently 15 readings in the last 90 days.  At this point okay lysing 7.4%, progressively improving.    He did not document hypoglycemia.  - I had a long discussion with him about the progressive nature of diabetes and the pathology behind its complications. -his  diabetes is complicated by coronary artery disease, chronic heavy smoking, sedentary life and he remains at a high risk for more acute and chronic complications which include CAD, CVA, CKD, retinopathy, and neuropathy. These are all discussed in detail with him.  - I have counseled him on diet  and weight management  by adopting a carbohydrate restricted/protein rich diet. Patient is encouraged to switch to  unprocessed or minimally processed     complex starch and increased protein intake (animal or plant source), fruits, and vegetables. -  he is advised to stick to a routine mealtimes to eat 3 meals  a day and avoid unnecessary snacks ( to snack only to correct hypoglycemia).   - he acknowledges that there is a room for improvement in his food and drink choices. - Suggestion is made for him to avoid simple carbohydrates  from his diet including Cakes, Sweet Desserts, Ice Cream, Soda (diet and regular), Sweet Tea, Candies, Chips, Cookies, Store Bought Juices, Alcohol , Artificial Sweeteners,  Coffee Creamer, and "Sugar-free" Products, Lemonade. This will help patient to have more stable blood glucose profile and potentially avoid unintended weight gain.  The following Lifestyle Medicine recommendations according to American College of Lifestyle Medicine  [facility]) were discussed and and offered to patient and he  agrees to start the journey:  A. Whole Foods, Plant-Based Nutrition comprising of fruits and vegetables, plant-based proteins, whole-grain carbohydrates was discussed in detail with the patient.   A list for source of those nutrients were also provided to the patient.  Patient will use only water  or unsweetened tea for hydration. B.  The need to stay away from risky substances including alcohol, smoking; obtaining 7 to 9 hours of restorative sleep, at least 150 minutes of moderate intensity exercise weekly, the importance of healthy social connections,  and stress management techniques were  discussed. C.  A full color page of  Calorie density of various food groups per pound showing examples of each food groups was provided to the patient.   - he will be scheduled with Norm Salt, RDN, CDE for diabetes education.  - I have approached him with the following individualized plan to manage  his diabetes and patient agrees:   -He presents with near target glycemic profile.  He did not engage enough for multiple daily injections of insulin.  He is advised to continue Lantus 50 units nightly, metformin 1000 mg p.o. twice daily.  He will be given a prescription for a CGM if his insurance provides coverage.  If not, he is approached again to monitor blood glucose twice a day-daily before breakfast and at bedtime.   In the meantime, he is encouraged to call clinic for hypoglycemia below 70 mg per DL or hyperglycemia above 200 mg per DL.  - he is warned not to take insulin without proper monitoring per orders.  -he did not tolerate Jardiance, Farxiga in the past and he does not want to start any of those medications. -He still has uncontrolled hypertriglyceridemia at 548, although improving from  1132.  He has his committed for regular follow-up, he will be considered for GLP-1 receptor agonist on subsequent visits.    - Specific targets for  A1c;  LDL, HDL,  and Triglycerides were discussed with the patient.  2) Blood Pressure /Hypertension:  -His blood pressure is controlled to target.  he is advised to continue his current medications including lisinopril 2.5 mg p.o. daily with breakfast . 3) Lipids/Hyperlipidemia:   Review of his recent lipid panel showed controlled LDL at 61.  See above for hypertriglyceridemia.  He is advised to continue Lipitor 40 mg p.o. nightly, Vascepa 2 g p.o. twice daily.     Whole food plant-based diet, discussed in detail, will help with dyslipidemia as well.  4)  Weight/Diet:  Body mass index is 27.65 kg/m.  -      he is  a candidate for some  weight  loss. I discussed with him the fact that loss of 5 - 10% of his  current body weight will have the most impact on his diabetes management.  Exercise, and detailed carbohydrates information provided  -  detailed on discharge instructions.  5) Chronic Care/Health Maintenance: 6) chronic smoker  -he  is on ACEI/ARB and Statin medications and  is encouraged to initiate and continue to follow up with Ophthalmology, Dentist,  Podiatrist at least yearly or according to recommendations, and advised to  quit smoking. I have recommended yearly flu vaccine and pneumonia vaccine at least every 5 years; moderate intensity exercise for up to 150 minutes weekly; and  sleep for at least 7 hours a day.  -He is started on low-dose vitamin D3 2000 units daily.  The patient was counseled on the dangers of tobacco use, and was advised to quit.  Reviewed strategies to maximize success, including removing cigarettes and smoking materials from environment.   - he is  advised to maintain close follow up with [name], MD for primary care needs, as well as his other providers for optimal and coordinated care.  I spent  26  minutes in the care of the patient today including review of labs from CMP, Lipids, Thyroid Function, Hematology (current and previous including abstractions from other facilities); face-to-face time discussing  his blood glucose readings/logs, discussing hypoglycemia and hyperglycemia episodes and symptoms, medications doses, his options of short and long term treatment based on the latest standards of care / guidelines;  discussion about incorporating lifestyle medicine;  and documenting the encounter. Risk reduction counseling performed per USPSTF guidelines to reduce  obesity and cardiovascular risk factors.     Please refer to Patient Instructions for Blood Glucose Monitoring and Insulin/Medications Dosing Guide"  in media tab for additional information. Please  also refer to " Patient Self  Inventory" in the Media  tab for reviewed elements of pertinent patient history.  Sevastian D Yarbrough participated in the discussions, expressed understanding, and voiced agreement with the above plans.  All questions were answered to his satisfaction. he is encouraged to contact clinic should he have any questions or concerns prior to his return visit.   Follow up plan: - Return in about 4 months (around 07/06/2023) for Bring Meter/CGM Device/Logs- A1c in Office.  Marquis Lunch, MD [facility] [address] [place], [place] [address] Phone: [phone]  Fax: [phone]    03/06/2023, 3:44 PM  This note was partially dictated with voice recognition software. Similar sounding words can be transcribed inadequately or may not  be corrected upon review.

## 2023-03-06 NOTE — Patient Instructions (Signed)

## 2023-03-07 ENCOUNTER — Other Ambulatory Visit: Payer: Self-pay | Admitting: "Endocrinology

## 2023-03-07 ENCOUNTER — Ambulatory Visit: Payer: 59 | Admitting: "Endocrinology

## 2023-03-09 ENCOUNTER — Ambulatory Visit (INDEPENDENT_AMBULATORY_CARE_PROVIDER_SITE_OTHER): Payer: 59

## 2023-03-09 VITALS — BP 118/76 | HR 82 | Ht 73.0 in | Wt 209.0 lb

## 2023-03-09 DIAGNOSIS — Z794 Long term (current) use of insulin: Secondary | ICD-10-CM | POA: Diagnosis not present

## 2023-03-09 DIAGNOSIS — E1165 Type 2 diabetes mellitus with hyperglycemia: Secondary | ICD-10-CM

## 2023-03-09 DIAGNOSIS — Z7984 Long term (current) use of oral hypoglycemic drugs: Secondary | ICD-10-CM

## 2023-03-09 DIAGNOSIS — E1159 Type 2 diabetes mellitus with other circulatory complications: Secondary | ICD-10-CM

## 2023-03-09 NOTE — Progress Notes (Signed)
Pt brought in his new Freestyle Libre 3 today. Went over usage and application instructions with patient. The [place] sensor was applied to his L arm. Pt voices understanding on how to use receiver and sensors. Also voices understanding on how to reapply a new sensor in 14 days.

## 2023-03-20 ENCOUNTER — Telehealth: Payer: Self-pay | Admitting: *Deleted

## 2023-03-20 ENCOUNTER — Encounter: Payer: Self-pay | Admitting: Cardiology

## 2023-03-20 ENCOUNTER — Telehealth: Payer: Self-pay

## 2023-03-20 ENCOUNTER — Telehealth: Payer: Self-pay | Admitting: Cardiology

## 2023-03-20 DIAGNOSIS — M65331 Trigger finger, right middle finger: Secondary | ICD-10-CM | POA: Diagnosis not present

## 2023-03-20 DIAGNOSIS — M65332 Trigger finger, left middle finger: Secondary | ICD-10-CM | POA: Diagnosis not present

## 2023-03-20 DIAGNOSIS — Z01818 Encounter for other preprocedural examination: Secondary | ICD-10-CM | POA: Diagnosis not present

## 2023-03-20 DIAGNOSIS — M79641 Pain in right hand: Secondary | ICD-10-CM | POA: Diagnosis not present

## 2023-03-20 DIAGNOSIS — M79642 Pain in left hand: Secondary | ICD-10-CM | POA: Diagnosis not present

## 2023-03-20 NOTE — Telephone Encounter (Signed)
   Name: [name]  DOB: 1965-12-01  MRN: [id number]  Primary Cardiologist: Dina Rich, MD   Preoperative team, please contact this patient and set up a phone call appointment for further preoperative risk assessment. Please obtain consent and complete medication review. Thank you for your help.Last seen by Dr. Dina Rich, 01/02/2023   I confirm that guidance regarding antiplatelet and oral anticoagulation therapy has been completed and, if necessary, noted below. No request for holding ASA. But patient is listed as taking.    Joni Reining, NP 03/20/2023, 11:54 AM [facility]

## 2023-03-20 NOTE — Telephone Encounter (Signed)
   Pre-operative Risk Assessment    Patient Name: [name]  DOB: Aug 25, 1965 MRN: [id number]      Request for Surgical Clearance    Procedure:   left middle trigger finger release  Date of Surgery:  Clearance TBD                                 Surgeon:  not listed Surgeon's Group or Practice Name:  [facility] Phone number:  [phone] Fax number:  [phone]   Type of Clearance Requested:   - Medical    Type of Anesthesia:  Not Indicated   Additional requests/questions:    Elvin So   03/20/2023, 11:35 AM

## 2023-03-20 NOTE — Telephone Encounter (Signed)
   Pre-operative Risk Assessment    Patient Name: [name]  DOB: 28-Feb-1966 MRN: [id number]      Request for Surgical Clearance    Procedure:   Left Middle Trigger Finger Release   Date of Surgery:  Clearance TBD                                 Surgeon:  Not indicated  Surgeon's Group or Practice Name:  [facility]  Phone number:  [phone] Fax number:  [phone]   Type of Clearance Requested:   - Medical    Type of Anesthesia:  Not Indicated   Additional requests/questions:    Lajuana Matte   03/20/2023, 12:02 PM

## 2023-03-20 NOTE — Telephone Encounter (Signed)
Duplicate. Pt is already scheduled.

## 2023-03-20 NOTE — Telephone Encounter (Signed)
Pt is scheduled for tele on 03/29/23 at 1:40 pm. Med rec and consent done

## 2023-03-20 NOTE — Telephone Encounter (Signed)
Pt is scheduled for tele on 03/29/23 at 1:40 pm. Med rec and consent done    Patient Consent for Virtual Visit        [name] has provided verbal consent on 03/20/2023 for a virtual visit (video or telephone).   CONSENT FOR VIRTUAL VISIT FOR:  [name]  By participating in this virtual visit I agree to the following:  I hereby voluntarily request, consent and authorize [facility] and its employed or contracted physicians, physician assistants, nurse practitioners or other licensed health care professionals (the Practitioner), to provide me with telemedicine health care services (the "Services") as deemed necessary by the treating Practitioner. I acknowledge and consent to receive the Services by the Practitioner via telemedicine. I understand that the telemedicine visit will involve communicating with the Practitioner through live audiovisual communication technology and the disclosure of certain medical information by electronic transmission. I acknowledge that I have been given the opportunity to request an in-person assessment or other available alternative prior to the telemedicine visit and am voluntarily participating in the telemedicine visit.  I understand that I have the right to withhold or withdraw my consent to the use of telemedicine in the course of my care at any time, without affecting my right to future care or treatment, and that the Practitioner or I may terminate the telemedicine visit at any time. I understand that I have the right to inspect all information obtained and/or recorded in the course of the telemedicine visit and may receive copies of available information for a reasonable fee.  I understand that some of the potential risks of receiving the Services via telemedicine include:  Delay or interruption in medical evaluation due to technological equipment failure or disruption; Information transmitted may not be sufficient (e.g. poor resolution of  images) to allow for appropriate medical decision making by the Practitioner; and/or  In rare instances, security protocols could fail, causing a breach of personal health information.  Furthermore, I acknowledge that it is my responsibility to provide information about my medical history, conditions and care that is complete and accurate to the best of my ability. I acknowledge that Practitioner's advice, recommendations, and/or decision may be based on factors not within their control, such as incomplete or inaccurate data provided by me or distortions of diagnostic images or specimens that may result from electronic transmissions. I understand that the practice of medicine is not an exact science and that Practitioner makes no warranties or guarantees regarding treatment outcomes. I acknowledge that a copy of this consent can be made available to me via my patient portal [facility] MyChart), or I can request a printed copy by calling the office of [facility].    I understand that my insurance will be billed for this visit.   I have read or had this consent read to me. I understand the contents of this consent, which adequately explains the benefits and risks of the Services being provided via telemedicine.  I have been provided ample opportunity to ask questions regarding this consent and the Services and have had my questions answered to my satisfaction. I give my informed consent for the services to be provided through the use of telemedicine in my medical care

## 2023-03-20 NOTE — Telephone Encounter (Signed)
   Name: [name]  DOB: Jul 30, 1965  MRN: [id number]  Primary Cardiologist: Dina Rich, MD   Preoperative team, please contact this patient and set up a phone call appointment for further preoperative risk assessment. Please obtain consent and complete medication review. Thank you for your help.Last seen by dr. Dina Rich on 01/02/2023.   I confirm that guidance regarding antiplatelet and oral anticoagulation therapy has been completed and, if necessary, noted below:  Per office protocol, if patient is without any new symptoms or concerns at the time of their virtual visit, he/she may hold ASA for 7 days prior to procedure. Please resume ASA as soon as possible postprocedure, at the discretion of the surgeon.     Joni Reining, NP 03/20/2023, 1:24 PM [facility]

## 2023-03-23 ENCOUNTER — Other Ambulatory Visit: Payer: Self-pay | Admitting: Cardiology

## 2023-03-23 DIAGNOSIS — Z23 Encounter for immunization: Secondary | ICD-10-CM | POA: Diagnosis not present

## 2023-03-23 DIAGNOSIS — M1991 Primary osteoarthritis, unspecified site: Secondary | ICD-10-CM | POA: Diagnosis not present

## 2023-03-23 DIAGNOSIS — G894 Chronic pain syndrome: Secondary | ICD-10-CM | POA: Diagnosis not present

## 2023-03-29 ENCOUNTER — Ambulatory Visit: Payer: 59 | Attending: Physician Assistant

## 2023-03-29 DIAGNOSIS — Z01810 Encounter for preprocedural cardiovascular examination: Secondary | ICD-10-CM | POA: Diagnosis not present

## 2023-03-29 NOTE — Progress Notes (Signed)
Virtual Visit via Telephone Note   Because of [name]'s co-morbid illnesses, he is at least at moderate risk for complications without adequate follow up.  This format is felt to be most appropriate for this patient at this time.  The patient did not have access to video technology/had technical difficulties with video requiring transitioning to audio format only (telephone).  All issues noted in this document were discussed and addressed.  No physical exam could be performed with this format.  Please refer to the patient's chart for his consent to telehealth for [facility].  Evaluation Performed:  Preoperative cardiovascular risk assessment _____________   Date:  03/29/2023   Patient ID:  [name], DOB 1965-07-21, MRN [id number] Patient Location:  Home Provider location:   Office  Primary Care Provider:  [name], MD Primary Cardiologist:  Dina Rich, MD  Chief Complaint / Patient Profile   57 y.o. y/o male with a h/o CAD s/p STEMI with BMS to RCA in 2015, HLD, type 2 diabetes mellitus  who is pending left middle trigger finger release and presents today for telephonic preoperative cardiovascular risk assessment.  History of Present Illness    [name] is a 57 y.o. male who presents via Web designer for a telehealth visit today.  Pt was last seen in cardiology clinic on 01/02/23 by Dr. Dina Rich. At that time [name] was doing well.  The patient is now pending procedure as outlined above. Since his last visit, he has remained stable from cardiac perspective. He notes he recently painted a few rooms in his house. He is able to achieve greater than 4 METs of activity.   Today he denies chest pain, shortness of breath, lower extremity edema, fatigue, palpitations, melena, hematuria, hemoptysis, diaphoresis, weakness, presyncope, syncope, orthopnea, and PND.   Past Medical History    Past Medical History:  Diagnosis Date    Anxiety    Borderline diabetes    Bulging disc    Chronic back pain    Closed intra-articular fracture of distal end of left tibia    Diabetes mellitus without complication (HCC)    HTN (hypertension)    Hypercholesteremia    Myocardial infarction [facility])    STEMI 2015 with BMS to RCA   Pancreatitis    Past Surgical History:  Procedure Laterality Date   ABDOMINAL SURGERY  1990   adhesions, blockage.    APPENDECTOMY     BACK SURGERY     CARDIAC CATHETERIZATION  2003   "trivial coronary artery disease," normal EF   COLONOSCOPY N/A 04/16/2015   Procedure: COLONOSCOPY;  Surgeon: Corbin Ade, MD;  [facility];  Service: Endoscopy;  Laterality: N/A;  12:45 PM   COLONOSCOPY WITH PROPOFOL N/A 06/28/2018   Rourk: Diverticulosis, 2 tubular adenomas removed.  Next colonoscopy in 5 years.   LEFT HEART CATH AND CORONARY ANGIOGRAPHY N/A 05/09/2017   Procedure: LEFT HEART CATH AND CORONARY ANGIOGRAPHY;  Surgeon: Marykay Lex, MD;  Location: [facility] INVASIVE CV LAB;  Service: Cardiovascular;  Laterality: N/A;   LEFT HEART CATHETERIZATION WITH CORONARY ANGIOGRAM N/A 03/13/2014   Procedure: LEFT HEART CATHETERIZATION WITH CORONARY ANGIOGRAM;  Surgeon: Marykay Lex, MD;  Location: [facility] CATH LAB;  Service: Cardiovascular;  Laterality: N/A;   OPEN REDUCTION INTERNAL FIXATION (ORIF) TIBIA/FIBULA FRACTURE Left 10/01/2018   Procedure: OPEN REDUCTION INTERNAL FIXATION LEFT PILON FRACTURE;  Surgeon: Myrene Galas, MD;  Location: [facility] OR;  Service: Orthopedics;  Laterality: Left;  POLYPECTOMY  06/28/2018   Procedure: POLYPECTOMY;  Surgeon: Corbin Ade, MD;  Location: [facility];  Service: Endoscopy;;  (colon)   Allergies No Known Allergies Home Medications    Prior to Admission medications   Medication Sig Start Date End Date Taking? Authorizing Provider  Accu-Chek Softclix Lancets lancets Use as instructed 11/02/22   Roma Kayser, MD  albuterol (PROVENTIL) (2.5 MG/3ML) 0.083%  nebulizer solution Take 3 mLs (2.5 mg total) by nebulization every 6 (six) hours as needed for wheezing or shortness of breath. 09/30/22   Jacalyn Lefevre, MD  Albuterol Sulfate (PROAIR HFA IN) Inhale 2 puffs into the lungs as needed.    [provider]  aspirin 81 MG chewable tablet Chew 1 tablet (81 mg total) by mouth daily. 03/15/14   Janetta Hora, PA-C  atorvastatin (LIPITOR) 80 MG tablet TAKE 1 TABLET(80 MG) BY MOUTH AT BEDTIME 07/05/22   Antoine Poche, MD  Blood Glucose Monitoring Suppl (ACCU-CHEK GUIDE ME) w/Device KIT 1 Piece by Does not apply route as directed. 04/07/22   Roma Kayser, MD  Continuous Glucose Receiver (FREESTYLE LIBRE 3 READER) DEVI 1 Piece by Does not apply route once as needed for up to 1 dose. 03/06/23   Roma Kayser, MD  Continuous Glucose Sensor (FREESTYLE LIBRE 3 SENSOR) MISC 1 Piece by Does not apply route every 14 (fourteen) days. Place 1 sensor on the skin every 14 days. Use to check glucose continuously 03/06/23   Roma Kayser, MD  CRANBERRY PO Take 1 tablet by mouth every 14 (fourteen) days.    [provider]  furosemide (LASIX) 20 MG tablet Take 1 tablet (20 mg total) by mouth daily as needed. 04/13/22   Antoine Poche, MD  glucose blood (ACCU-CHEK GUIDE) test strip CHECK SUGARS TWICE DAILY 03/07/23   Roma Kayser, MD  Insulin Pen Needle 31G X 5 MM MISC Use to inject insulin 11/02/22   Roma Kayser, MD  isosorbide mononitrate (IMDUR) 60 MG 24 hr tablet Take 1 tablet (60 mg total) by mouth daily. 06/30/22   Antoine Poche, MD  LANTUS SOLOSTAR 100 UNIT/ML Solostar Pen ADMINISTER 50 UNITS UNDER THE SKIN AT BEDTIME 03/01/23   Roma Kayser, MD  LINZESS 72 MCG capsule TAKE 1 CAPSULE(72 MCG) BY MOUTH DAILY BEFORE BREAKFAST 07/08/22   Tiffany Kocher, PA-C  lisinopril (ZESTRIL) 2.5 MG tablet TAKE 1 TABLET(2.5 MG) BY MOUTH DAILY 01/19/23   Antoine Poche, MD  metFORMIN (GLUCOPHAGE) 1000 MG  tablet Take 1,000 mg by mouth 2 (two) times daily. 03/28/19   [provider]  Multiple Vitamin (MULTIVITAMIN) tablet Take 1 tablet by mouth daily.    [provider]  nitroGLYCERIN (NITROSTAT) 0.4 MG SL tablet Place 1 tablet (0.4 mg total) under the tongue every 5 (five) minutes x 3 doses as needed for chest pain (if no relief after 2nd dose, proceed to the ED or call 911). 08/26/21   Antoine Poche, MD  Omega-3 Fatty Acids (SALMON OIL-1000 PO) Take 1 capsule by mouth daily.    [provider]  oxyCODONE-acetaminophen (PERCOCET) 5-325 MG tablet Take 1 tablet by mouth every 6 (six) hours as needed. 05/29/21   Bethann Berkshire, MD  VASCEPA 1 g capsule TAKE 2 CAPSULES(2 GRAMS) BY MOUTH TWICE DAILY 03/23/23   Antoine Poche, MD  zolpidem (AMBIEN) 10 MG tablet Take 10 mg by mouth at bedtime as needed. 07/28/19   [provider]  Physical Exam    Vital Signs:  [name] does not have vital signs available for review today.  Given telephonic nature of communication, physical exam is limited. AAOx3. NAD. Normal affect.  Speech and respirations are unlabored.  Accessory Clinical Findings    None  Assessment & Plan    1.  Preoperative Cardiovascular Risk Assessment:Left middle trigger finger release  [name]'s perioperative risk of a major cardiac event is 6.6% according to the Revised Cardiac Risk Index (RCRI).  Therefore, he is at high risk for perioperative complications.  His functional capacity is good at 7.34 METs according to the Duke Activity Status Index (DASI). Recommendations: According to ACC/AHA guidelines, no further cardiovascular testing needed.  The patient may proceed to surgery at acceptable risk.   Antiplatelet and/or Anticoagulation Recommendations: Ideally patient would continue aspirin 81 mg daily throughout perioperative period, if felt to be too high risk for bleeding can hold for 5-7 days prior to procedure. Please resume aspirin  when safe to do so from a bleeding perspective.   The patient was advised that if he develops new symptoms prior to surgery to contact our office to arrange for a follow-up visit, and he verbalized understanding.  A copy of this note will be routed to requesting surgeon.  Time:   Today, I have spent 6 minutes with the patient with telehealth technology discussing medical history, symptoms, and management plan.     Rip Harbour, NP  03/29/2023, 2:44 PM

## 2023-04-04 DIAGNOSIS — G8929 Other chronic pain: Secondary | ICD-10-CM | POA: Diagnosis not present

## 2023-04-04 DIAGNOSIS — Z794 Long term (current) use of insulin: Secondary | ICD-10-CM | POA: Diagnosis not present

## 2023-04-04 DIAGNOSIS — Z7982 Long term (current) use of aspirin: Secondary | ICD-10-CM | POA: Diagnosis not present

## 2023-04-04 DIAGNOSIS — E785 Hyperlipidemia, unspecified: Secondary | ICD-10-CM | POA: Diagnosis not present

## 2023-04-04 DIAGNOSIS — I25118 Atherosclerotic heart disease of native coronary artery with other forms of angina pectoris: Secondary | ICD-10-CM | POA: Diagnosis not present

## 2023-04-04 DIAGNOSIS — F17210 Nicotine dependence, cigarettes, uncomplicated: Secondary | ICD-10-CM | POA: Diagnosis not present

## 2023-04-04 DIAGNOSIS — I2510 Atherosclerotic heart disease of native coronary artery without angina pectoris: Secondary | ICD-10-CM | POA: Diagnosis not present

## 2023-04-04 DIAGNOSIS — J432 Centrilobular emphysema: Secondary | ICD-10-CM | POA: Diagnosis not present

## 2023-04-04 DIAGNOSIS — E119 Type 2 diabetes mellitus without complications: Secondary | ICD-10-CM | POA: Diagnosis not present

## 2023-04-04 DIAGNOSIS — I10 Essential (primary) hypertension: Secondary | ICD-10-CM | POA: Diagnosis not present

## 2023-04-04 DIAGNOSIS — M65332 Trigger finger, left middle finger: Secondary | ICD-10-CM | POA: Diagnosis not present

## 2023-04-29 ENCOUNTER — Other Ambulatory Visit: Payer: Self-pay | Admitting: Cardiology

## 2023-04-29 ENCOUNTER — Other Ambulatory Visit: Payer: Self-pay | Admitting: "Endocrinology

## 2023-05-01 DIAGNOSIS — M75101 Unspecified rotator cuff tear or rupture of right shoulder, not specified as traumatic: Secondary | ICD-10-CM | POA: Diagnosis not present

## 2023-05-01 DIAGNOSIS — M75102 Unspecified rotator cuff tear or rupture of left shoulder, not specified as traumatic: Secondary | ICD-10-CM | POA: Diagnosis not present

## 2023-05-01 DIAGNOSIS — M25511 Pain in right shoulder: Secondary | ICD-10-CM | POA: Diagnosis not present

## 2023-05-01 DIAGNOSIS — G8929 Other chronic pain: Secondary | ICD-10-CM | POA: Diagnosis not present

## 2023-05-01 DIAGNOSIS — M25512 Pain in left shoulder: Secondary | ICD-10-CM | POA: Diagnosis not present

## 2023-05-01 DIAGNOSIS — M7581 Other shoulder lesions, right shoulder: Secondary | ICD-10-CM | POA: Diagnosis not present

## 2023-05-02 DIAGNOSIS — G894 Chronic pain syndrome: Secondary | ICD-10-CM | POA: Diagnosis not present

## 2023-05-04 ENCOUNTER — Other Ambulatory Visit: Payer: Self-pay

## 2023-05-04 MED ORDER — FREESTYLE LIBRE 3 PLUS SENSOR MISC: 1 refills | Status: DC

## 2023-05-30 NOTE — Progress Notes (Unsigned)
Referring Provider: [name], MD Primary Care Physician:  [name], MD Primary GI Physician: Dr. Jena Gauss  Chief Complaint  Patient presents with   Colonoscopy    Colonoscopy screening. Constipation sometimes      HPI:   [name] is a 57 y.o. male with history of anxiety, diabetes, chronic back pain, HTN, HLD, MI, pancreatitis, chronic constipation, adenomatous colon polyps, presenting today for routine follow-up of constipation and to discuss scheduling surveillance colonoscopy.  Last seen in the office 12/01/2022.  Chronic constipation well-controlled on Linzess 72 mcg as needed.  Advised to continue current regimen and follow-up in 6 months.  Last colonoscopy was in December 2019 with diverticulosis, 2 tubular adenomas with recommendations to repeat in 5 years.  Today: Uses Linzess prn as he has bowel movements most every day.  No abdominal pain, BRBPR, melena, unintentional weight loss.  No upper GI concerns such as nausea, vomiting, reflux symptoms, dysphagia.   Past Medical History:  Diagnosis Date   Anxiety    Borderline diabetes    Bulging disc    Chronic back pain    Closed intra-articular fracture of distal end of left tibia    Diabetes mellitus without complication (HCC)    HTN (hypertension)    Hypercholesteremia    Myocardial infarction [facility])    STEMI 2015 with BMS to RCA   Pancreatitis     Past Surgical History:  Procedure Laterality Date   ABDOMINAL SURGERY  1990   adhesions, blockage.    APPENDECTOMY     BACK SURGERY     CARDIAC CATHETERIZATION  2003   "trivial coronary artery disease," normal EF   COLONOSCOPY N/A 04/16/2015   Procedure: COLONOSCOPY;  Surgeon: Corbin Ade, MD;  [facility];  Service: Endoscopy;  Laterality: N/A;  12:45 PM   COLONOSCOPY WITH PROPOFOL N/A 06/28/2018   Rourk: Diverticulosis, 2 tubular adenomas removed.  Next colonoscopy in 5 years.   LEFT HEART CATH AND CORONARY ANGIOGRAPHY N/A 05/09/2017    Procedure: LEFT HEART CATH AND CORONARY ANGIOGRAPHY;  Surgeon: Marykay Lex, MD;  Location: [facility] INVASIVE CV LAB;  Service: Cardiovascular;  Laterality: N/A;   LEFT HEART CATHETERIZATION WITH CORONARY ANGIOGRAM N/A 03/13/2014   Procedure: LEFT HEART CATHETERIZATION WITH CORONARY ANGIOGRAM;  Surgeon: Marykay Lex, MD;  Location: [facility] CATH LAB;  Service: Cardiovascular;  Laterality: N/A;   OPEN REDUCTION INTERNAL FIXATION (ORIF) TIBIA/FIBULA FRACTURE Left 10/01/2018   Procedure: OPEN REDUCTION INTERNAL FIXATION LEFT PILON FRACTURE;  Surgeon: Myrene Galas, MD;  Location: [facility] OR;  Service: Orthopedics;  Laterality: Left;   POLYPECTOMY  06/28/2018   Procedure: POLYPECTOMY;  Surgeon: Corbin Ade, MD;  [facility]: [facility];  Service: Endoscopy;;  (colon)    Current Outpatient Medications  Medication Sig Dispense Refill   Accu-Chek Softclix Lancets lancets Use as instructed 100 each 3   albuterol (PROVENTIL) (2.5 MG/3ML) 0.083% nebulizer solution Take 3 mLs (2.5 mg total) by nebulization every 6 (six) hours as needed for wheezing or shortness of breath. 75 mL 0   Albuterol Sulfate (PROAIR HFA IN) Inhale 2 puffs into the lungs as needed.     aspirin 81 MG chewable tablet Chew 1 tablet (81 mg total) by mouth daily.     atorvastatin (LIPITOR) 80 MG tablet TAKE 1 TABLET(80 MG) BY MOUTH AT BEDTIME 90 tablet 3   Blood Glucose Monitoring Suppl (ACCU-CHEK GUIDE ME) w/Device KIT USE TO CHECK BLOOD GLUCOSE 1 kit 0   Continuous Glucose  Receiver (FREESTYLE LIBRE 3 READER) DEVI 1 Piece by Does not apply route once as needed for up to 1 dose. 1 each 0   Continuous Glucose Sensor (FREESTYLE LIBRE 3 PLUS SENSOR) MISC Change sensor every 15 days. 6 each 1   CRANBERRY PO Take 1 tablet by mouth every 14 (fourteen) days.     furosemide (LASIX) 20 MG tablet Take 1 tablet (20 mg total) by mouth daily as needed. 90 tablet 3   glucose blood (ACCU-CHEK GUIDE) test strip CHECK SUGARS TWICE DAILY 100 strip 2   Insulin  Pen Needle (B-D ULTRAFINE III SHORT PEN) 31G X 8 MM MISC USE WITH INSULIN EVERY DAY 100 each 3   Insulin Pen Needle 31G X 5 MM MISC Use to inject insulin 100 each 1   isosorbide mononitrate (IMDUR) 60 MG 24 hr tablet TAKE 1 TABLET BY MOUTH DAILY 90 tablet 1   LANTUS SOLOSTAR 100 UNIT/ML Solostar Pen Inject 50 Units into the skin at bedtime. 45 mL 0   LINZESS 72 MCG capsule TAKE 1 CAPSULE(72 MCG) BY MOUTH DAILY BEFORE BREAKFAST 30 capsule 5   lisinopril (ZESTRIL) 2.5 MG tablet TAKE 1 TABLET(2.5 MG) BY MOUTH DAILY 90 tablet 1   metFORMIN (GLUCOPHAGE) 1000 MG tablet Take 1,000 mg by mouth 2 (two) times daily.     Multiple Vitamin (MULTIVITAMIN) tablet Take 1 tablet by mouth daily.     nitroGLYCERIN (NITROSTAT) 0.4 MG SL tablet Place 1 tablet (0.4 mg total) under the tongue every 5 (five) minutes x 3 doses as needed for chest pain (if no relief after 2nd dose, proceed to the ED or call 911). 25 tablet 3   Omega-3 Fatty Acids (SALMON OIL-1000 PO) Take 1 capsule by mouth daily.     oxyCODONE-acetaminophen (PERCOCET) 5-325 MG tablet Take 1 tablet by mouth every 6 (six) hours as needed. 20 tablet 0   VASCEPA 1 g capsule TAKE 2 CAPSULES(2 GRAMS) BY MOUTH TWICE DAILY 120 capsule 6   zolpidem (AMBIEN) 10 MG tablet Take 10 mg by mouth at bedtime as needed.     No current facility-administered medications for this visit.    Allergies as of 06/01/2023   (No Known Allergies)    Family History  Problem Relation Age of Onset   Hypertension Mother    Hyperlipidemia Mother    Diabetes Father    Hyperlipidemia Father    Hypertension Father    Hyperlipidemia Sister    Hypertension Sister    Diabetes Sister    Colon cancer Paternal Uncle        over age 56    Social History   Socioeconomic History   Marital status: Single    Spouse name: Not on file   Number of children: Not on file   Years of education: Not on file   Highest education level: Not on file  Occupational History   Not on file   Tobacco Use   Smoking status: Every Day    Current packs/day: 0.50    Average packs/day: 0.5 packs/day for 45.6 years (22.8 ttl pk-yrs)    Types: Cigarettes    Start date: 10/25/1977   Smokeless tobacco: Never  Vaping Use   Vaping status: Never Used  Substance and Sexual Activity   Alcohol use: Not Currently    Alcohol/week: 6.0 standard drinks of alcohol    Types: 6 Cans of beer per week    Comment: denied 06/08/21   Drug use: Yes    Types: Marijuana  Sexual activity: Yes  Other Topics Concern   Not on file  Social History Narrative   Not on file   Social Determinants of Health   Financial Resource Strain: Not on file  Food Insecurity: Not on file  Transportation Needs: Not on file  Physical Activity: Not on file  Stress: Not on file  Social Connections: Not on file    Review of Systems: Gen: Denies fever, chills, cold or flulike symptoms, presyncope, syncope. CV: Denies chest pain, palpitations. Resp: Denies dyspnea, cough. GI: See HPI  Heme: See HPI  Physical Exam: BP 115/74 (BP Location: Right Arm, Patient Position: Sitting, Cuff Size: Normal)   Pulse 71   Temp 97.9 F (36.6 C) (Temporal)   Ht 6\' 1"  (1.854 m)   Wt 208 lb 9.6 oz (94.6 kg)   BMI 27.52 kg/m  General:   Alert and oriented. No distress noted. Pleasant and cooperative.  Head:  Normocephalic and atraumatic. Eyes:  Conjuctiva clear without scleral icterus. Heart:  S1, S2 present without murmurs appreciated. Lungs:  Clear to auscultation bilaterally. No wheezes, rales, or rhonchi. No distress.  Abdomen:  +BS, soft, non-tender and non-distended. No rebound or guarding. No HSM or masses noted. Msk:  Symmetrical without gross deformities. Normal posture. Extremities:  Without edema. Neurologic:  Alert and  oriented x4 Psych:  Normal mood and affect.    Assessment:  57 y.o. male with history of anxiety, diabetes, chronic back pain, HTN, HLD, MI, pancreatitis, chronic constipation, adenomatous  colon polyps, presenting today for routine follow-up of constipation and to discuss scheduling surveillance colonoscopy.   Constipation: Well-controlled on Linzess 72 mcg as needed.  History of adenomatous colon polyps: Due for surveillance.  Last colonoscopy in December 2019 with diverticulosis, 2 tubular adenomas.  Recommended 5-year surveillance.  Currently doing well with no significant lower GI symptoms or alarm symptoms.   Plan:  Continue Linzess 72 mcg as needed. Proceed with colonoscopy with propofol by Dr. Jena Gauss in near future. The risks, benefits, and alternatives have been discussed with the patient in detail. The patient states understanding and desires to proceed.  ASA 3 1 day prior: One half dose of Lantus and one half dose of metformin Day of procedure: No morning diabetes medications Follow-up in 1 year or sooner if needed.   Ermalinda Memos, PA-C [facility] 06/01/2023

## 2023-06-01 ENCOUNTER — Ambulatory Visit: Payer: 59 | Admitting: Gastroenterology

## 2023-06-01 ENCOUNTER — Encounter: Payer: Self-pay | Admitting: Gastroenterology

## 2023-06-01 VITALS — BP 115/74 | HR 71 | Temp 97.9°F | Ht 73.0 in | Wt 208.6 lb

## 2023-06-01 DIAGNOSIS — K5900 Constipation, unspecified: Secondary | ICD-10-CM | POA: Diagnosis not present

## 2023-06-01 DIAGNOSIS — G4700 Insomnia, unspecified: Secondary | ICD-10-CM | POA: Diagnosis not present

## 2023-06-01 DIAGNOSIS — Z860101 Personal history of adenomatous and serrated colon polyps: Secondary | ICD-10-CM

## 2023-06-01 DIAGNOSIS — G894 Chronic pain syndrome: Secondary | ICD-10-CM | POA: Diagnosis not present

## 2023-06-01 NOTE — Patient Instructions (Signed)
We will get you scheduled for a colonoscopy in the near future with Dr. Jena Gauss. 1 day prior to procedure: Take one half dose of metformin and one half dose of Lantus. Day of your procedure: Do not take any morning diabetes medications.  Continue using Linzess as needed for constipation.  I will plan to see you back for routine follow-up in 1 year or sooner if needed.  You have a fantastic Thanksgiving and Christmas!  Ermalinda Memos, PA-C [facility]

## 2023-06-11 ENCOUNTER — Other Ambulatory Visit: Payer: Self-pay | Admitting: Cardiology

## 2023-06-13 ENCOUNTER — Telehealth: Payer: Self-pay | Admitting: *Deleted

## 2023-06-13 MED ORDER — PEG 3350-KCL-NA BICARB-NACL 420 G PO SOLR: 4000.0000 mL | Freq: Once | ORAL | 0 refills | Status: AC

## 2023-06-13 NOTE — Telephone Encounter (Signed)
PATIENT CALLED BACK. He has been scheduled for 1/15. Aware will send instructions/pre-op via mail. Confirmed address. Aware to call if he doesn't receive instructions in the next 2-3 weeks. Confirmed pharmacy. Insurance will remain same next year.

## 2023-06-13 NOTE — Telephone Encounter (Signed)
LMOVM to call back to schedule TCS with Dr. Jena Gauss, ASA 3, 1 day prior: 1/2 dose lantus, 1/2 dose Metformin, day of procedure: NO DM MEDS

## 2023-06-22 DIAGNOSIS — G894 Chronic pain syndrome: Secondary | ICD-10-CM | POA: Diagnosis not present

## 2023-06-28 DIAGNOSIS — Z23 Encounter for immunization: Secondary | ICD-10-CM | POA: Diagnosis not present

## 2023-06-28 DIAGNOSIS — E781 Pure hyperglyceridemia: Secondary | ICD-10-CM | POA: Diagnosis not present

## 2023-06-28 DIAGNOSIS — Z79899 Other long term (current) drug therapy: Secondary | ICD-10-CM | POA: Diagnosis not present

## 2023-06-28 DIAGNOSIS — E785 Hyperlipidemia, unspecified: Secondary | ICD-10-CM | POA: Diagnosis not present

## 2023-06-28 DIAGNOSIS — S61432A Puncture wound without foreign body of left hand, initial encounter: Secondary | ICD-10-CM | POA: Diagnosis not present

## 2023-06-28 DIAGNOSIS — E119 Type 2 diabetes mellitus without complications: Secondary | ICD-10-CM | POA: Diagnosis not present

## 2023-06-28 DIAGNOSIS — L03114 Cellulitis of left upper limb: Secondary | ICD-10-CM | POA: Diagnosis not present

## 2023-06-28 DIAGNOSIS — W458XXA Other foreign body or object entering through skin, initial encounter: Secondary | ICD-10-CM | POA: Diagnosis not present

## 2023-06-28 DIAGNOSIS — F17210 Nicotine dependence, cigarettes, uncomplicated: Secondary | ICD-10-CM | POA: Diagnosis not present

## 2023-06-28 DIAGNOSIS — I252 Old myocardial infarction: Secondary | ICD-10-CM | POA: Diagnosis not present

## 2023-06-28 DIAGNOSIS — Z7982 Long term (current) use of aspirin: Secondary | ICD-10-CM | POA: Diagnosis not present

## 2023-06-28 DIAGNOSIS — I10 Essential (primary) hypertension: Secondary | ICD-10-CM | POA: Diagnosis not present

## 2023-06-28 DIAGNOSIS — Z794 Long term (current) use of insulin: Secondary | ICD-10-CM | POA: Diagnosis not present

## 2023-06-28 DIAGNOSIS — I2510 Atherosclerotic heart disease of native coronary artery without angina pectoris: Secondary | ICD-10-CM | POA: Diagnosis not present

## 2023-06-28 DIAGNOSIS — M19042 Primary osteoarthritis, left hand: Secondary | ICD-10-CM | POA: Diagnosis not present

## 2023-06-28 DIAGNOSIS — M7989 Other specified soft tissue disorders: Secondary | ICD-10-CM | POA: Diagnosis not present

## 2023-06-28 DIAGNOSIS — Z7984 Long term (current) use of oral hypoglycemic drugs: Secondary | ICD-10-CM | POA: Diagnosis not present

## 2023-07-10 ENCOUNTER — Ambulatory Visit: Payer: 59 | Admitting: Cardiology

## 2023-07-14 ENCOUNTER — Ambulatory Visit: Payer: 59 | Admitting: Cardiology

## 2023-07-14 NOTE — Progress Notes (Deleted)
 Clinical Summary [name] is a 58 y.o.male  seen today for follow up of the following medical problems.    1. CAD - admit 03/2014 with inferior STEMI, received BMS to RCA. LVEF 45-50% by LV gram. 04/2017 cath: patent coronaries, patent RCA stent. Elevated LVEDP 26  - 03/2020 nuclear stress: no ischemia     - no chest pains, no SOB/DOE  - compliant with meds   2. Hyperlipidemia - tolerates pravastatin , reports prior myaglias on prior statins - elevated TGs by last labs. Was to have repeat labs but does not appear this was done. Most recent labs he reports he had coffee with whole milk prior to labs, was not a true fasting lab - muscle aches on gemfibrozil  in the past, tolerated fenofibrate  but had some financial issues with costs of medicines about 5 years ago and medication was stopped       Jan 2023 TG 224, LDL 111 03/2022 TC 178 TG 425 HDL 36 LDL 75 03/2022 A1c 8.2     -vascepa  started 08/2022 -08/2022 TC 172 TG 548 HDL 28 LDL 61 - 10/2022 HgbA1c 7.9 Past Medical History:  Diagnosis Date   Anxiety    Borderline diabetes    Bulging disc    Chronic back pain    Closed intra-articular fracture of distal end of left tibia    Diabetes mellitus without complication (HCC)    HTN (hypertension)    Hypercholesteremia    Myocardial infarction [facility])    STEMI 2015 with BMS to RCA   Pancreatitis      No Known Allergies   Current Outpatient Medications  Medication Sig Dispense Refill   Accu-Chek Softclix Lancets lancets Use as instructed 100 each 3   albuterol  (PROVENTIL ) (2.5 MG/3ML) 0.083% nebulizer solution Take 3 mLs (2.5 mg total) by nebulization every 6 (six) hours as needed for wheezing or shortness of breath. 75 mL 0   Albuterol  Sulfate (PROAIR  HFA IN) Inhale 2 puffs into the lungs as needed.     aspirin  81 MG chewable tablet Chew 1 tablet (81 mg total) by mouth daily.     atorvastatin  (LIPITOR) 80 MG tablet TAKE 1 TABLET(80 MG) BY MOUTH AT BEDTIME 90 tablet 3    Blood Glucose Monitoring Suppl (ACCU-CHEK GUIDE ME) w/Device KIT USE TO CHECK BLOOD GLUCOSE 1 kit 0   Continuous Glucose Receiver (FREESTYLE LIBRE 3 READER) DEVI 1 Piece by Does not apply route once as needed for up to 1 dose. 1 each 0   Continuous Glucose Sensor (FREESTYLE LIBRE 3 PLUS SENSOR) MISC Change sensor every 15 days. 6 each 1   CRANBERRY PO Take 1 tablet by mouth every 14 (fourteen) days.     furosemide  (LASIX ) 20 MG tablet TAKE 1 TABLET(20 MG) BY MOUTH DAILY AS NEEDED 90 tablet 1   glucose blood (ACCU-CHEK GUIDE) test strip CHECK SUGARS TWICE DAILY 100 strip 2   Insulin  Pen Needle (B-D ULTRAFINE III SHORT PEN) 31G X 8 MM MISC USE WITH INSULIN  EVERY DAY 100 each 3   Insulin  Pen Needle 31G X 5 MM MISC Use to inject insulin  100 each 1   isosorbide  mononitrate (IMDUR ) 60 MG 24 hr tablet TAKE 1 TABLET BY MOUTH DAILY 90 tablet 1   LANTUS  SOLOSTAR 100 UNIT/ML Solostar Pen Inject 50 Units into the skin at bedtime. 45 mL 0   LINZESS  72 MCG capsule TAKE 1 CAPSULE(72 MCG) BY MOUTH DAILY BEFORE BREAKFAST 30 capsule 5   lisinopril  (ZESTRIL )  2.5 MG tablet TAKE 1 TABLET(2.5 MG) BY MOUTH DAILY 90 tablet 1   metFORMIN  (GLUCOPHAGE ) 1000 MG tablet Take 1,000 mg by mouth 2 (two) times daily.     Multiple Vitamin (MULTIVITAMIN) tablet Take 1 tablet by mouth daily.     nitroGLYCERIN  (NITROSTAT ) 0.4 MG SL tablet Place 1 tablet (0.4 mg total) under the tongue every 5 (five) minutes x 3 doses as needed for chest pain (if no relief after 2nd dose, proceed to the ED or call 911). 25 tablet 3   Omega-3 Fatty Acids (SALMON OIL-1000 PO) Take 1 capsule by mouth daily.     oxyCODONE -acetaminophen  (PERCOCET) 5-325 MG tablet Take 1 tablet by mouth every 6 (six) hours as needed. 20 tablet 0   VASCEPA  1 g capsule TAKE 2 CAPSULES(2 GRAMS) BY MOUTH TWICE DAILY 120 capsule 6   zolpidem (AMBIEN) 10 MG tablet Take 10 mg by mouth at bedtime as needed.     No current facility-administered medications for this visit.      Past Surgical History:  Procedure Laterality Date   ABDOMINAL SURGERY  1990   adhesions, blockage.    APPENDECTOMY     BACK SURGERY     CARDIAC CATHETERIZATION  2003   trivial coronary artery disease, normal EF   COLONOSCOPY N/A 04/16/2015   Procedure: COLONOSCOPY;  Surgeon: Lamar CHRISTELLA Hollingshead, MD;  Location: [facility];  Service: Endoscopy;  Laterality: N/A;  12:45 PM   COLONOSCOPY WITH PROPOFOL  N/A 06/28/2018   Rourk: Diverticulosis, 2 tubular adenomas removed.  Next colonoscopy in 5 years.   LEFT HEART CATH AND CORONARY ANGIOGRAPHY N/A 05/09/2017   Procedure: LEFT HEART CATH AND CORONARY ANGIOGRAPHY;  Surgeon: Anner Alm ORN, MD;  Location: [facility] INVASIVE CV LAB;  Service: Cardiovascular;  Laterality: N/A;   LEFT HEART CATHETERIZATION WITH CORONARY ANGIOGRAM N/A 03/13/2014   Procedure: LEFT HEART CATHETERIZATION WITH CORONARY ANGIOGRAM;  Surgeon: Alm ORN Anner, MD;  Location: [facility] CATH LAB;  Service: Cardiovascular;  Laterality: N/A;   OPEN REDUCTION INTERNAL FIXATION (ORIF) TIBIA/FIBULA FRACTURE Left 10/01/2018   Procedure: OPEN REDUCTION INTERNAL FIXATION LEFT PILON FRACTURE;  Surgeon: Celena Sharper, MD;  Location: [facility] OR;  Service: Orthopedics;  Laterality: Left;   POLYPECTOMY  06/28/2018   Procedure: POLYPECTOMY;  Surgeon: Hollingshead Lamar CHRISTELLA, MD;  Location: [facility];  Service: Endoscopy;;  (colon)     No Known Allergies    Family History  Problem Relation Age of Onset   Hypertension Mother    Hyperlipidemia Mother    Diabetes Father    Hyperlipidemia Father    Hypertension Father    Hyperlipidemia Sister    Hypertension Sister    Diabetes Sister    Colon cancer Paternal Uncle        over age 68     Social History [name] reports that he has been smoking cigarettes. He started smoking about 45 years ago. He has a 22.9 pack-year smoking history. He has never used smokeless tobacco. [name] reports that he does not currently use alcohol after a past usage of  about 6.0 standard drinks of alcohol per week.   Review of Systems CONSTITUTIONAL: No weight loss, fever, chills, weakness or fatigue.  HEENT: Eyes: No visual loss, blurred vision, double vision or yellow sclerae.No hearing loss, sneezing, congestion, runny nose or sore throat.  SKIN: No rash or itching.  CARDIOVASCULAR:  RESPIRATORY: No shortness of breath, cough or sputum.  GASTROINTESTINAL: No anorexia, nausea, vomiting or diarrhea. No abdominal pain  or blood.  GENITOURINARY: No burning on urination, no polyuria NEUROLOGICAL: No headache, dizziness, syncope, paralysis, ataxia, numbness or tingling in the extremities. No change in bowel or bladder control.  MUSCULOSKELETAL: No muscle, back pain, joint pain or stiffness.  LYMPHATICS: No enlarged nodes. No history of splenectomy.  PSYCHIATRIC: No history of depression or anxiety.  ENDOCRINOLOGIC: No reports of sweating, cold or heat intolerance. No polyuria or polydipsia.  SABRA   Physical Examination There were no vitals filed for this visit. There were no vitals filed for this visit.  Gen: resting comfortably, no acute distress HEENT: no scleral icterus, pupils equal round and reactive, no palptable cervical adenopathy,  CV Resp: Clear to auscultation bilaterally GI: abdomen is soft, non-tender, non-distended, normal bowel sounds, no hepatosplenomegaly MSK: extremities are warm, no edema.  Skin: warm, no rash Neuro:  no focal deficits Psych: appropriate affect   Diagnostic Studies 02/2017 nuclear stress No diagnostic ST segment changes to indicate ischemia. Small, mild intensity, apical to basal inferior defect is partially reversible and suggestive of ischemia, although there is gut radiotracer uptake artifact adjacent to the inferior wall on rest imaging which could also be contributing to the defect reversibility. This is a low risk study. Nuclear stress EF: 63%.     04/2017 cath   Angiographically normal coronary  arteries Dist RCA BMS (VeriFlex 5.0 x 15), 0 %stenosed. The left ventricular systolic function is normal. The left ventricular ejection fraction is 50-55% by visual estimate. LV end diastolic pressure is moderately elevated.   Patient essentially has intra-graphic normal coronary arteries with widely RCA.  No culprit lesion to explain inferior ischemia. Elevated LVEDP could explain microvascular ischemia.   Otherwise consider non-anginal etiology for chest pain.     Plan: Discharge home today after bed rest with TR band removal.   03/2020 nuclear stress No diagnostic ST segment changes to indicate ischemia. Medium sized, moderate intensity, fixed apical to basal inferior defect that is more prominent at rest and consistent with soft tissue attenuation versus scar. No substantial ischemic territories. This is a low risk study. Nuclear stress EF: 51%.    Assessment and Plan  1. CAD -no recent symptoms, continue current meds   2. Hyperlipidemia -LDL at goal. We recently started vascepa  for high TGs, has upcoming with labs with endocrine in next month. F/u results.       Dorn PHEBE Ross, M.D., F.A.C.C.

## 2023-07-17 ENCOUNTER — Ambulatory Visit: Payer: 59 | Admitting: "Endocrinology

## 2023-07-19 NOTE — Patient Instructions (Addendum)
 [name]  07/19/2023     @PREFPERIOPPHARMACY @   Your procedure is scheduled on 07/26/2023.   Report to [facility] at  0845  A.M.   Call this number if you have problems the morning of surgery:  [phone]  If you experience any cold or flu symptoms such as cough, fever, chills, shortness of breath, etc. between now and your scheduled surgery, please notify us  at the above number.   Remember:      Take 1/2 of your usual insulin  dosage the night before your procedure.      DO NOT take any medications for diabetes the morning of your procedure.       Use your nebulizer and your inhaler before you come and bring your rescue inhaler with you.    Follow the diet and prep instructions given to you by the office.   You may drink clear liquids until  0645 am on 07/26/2023.   Clear liquids allowed are:                    Water , Juice (No red color; non-citric and without pulp; diabetics please choose diet or no sugar options), Carbonated beverages (diabetics please choose diet or no sugar options), Clear Tea (No creamer, milk, or cream, including half & half and powdered creamer), Black Coffee Only (No creamer, milk or cream, including half & half and powdered creamer), and Clear Sports drink (No red color; diabetics please choose diet or no sugar options)    Take these medicines the morning of surgery with A SIP OF WATER                      isosorbide , oxycodone (if needed).    Do not wear jewelry, make-up or nail polish, including gel polish,  artificial nails, or any other type of covering on natural nails (fingers and  toes).  Do not wear lotions, powders, or perfumes, or deodorant.  Do not shave 48 hours prior to surgery.  Men may shave face and neck.  Do not bring valuables to the hospital.  [facility] is not responsible for any belongings or valuables.  Contacts, dentures or bridgework may not be worn into surgery.  Leave your suitcase in the car.  After surgery  it may be brought to your room.  For patients admitted to the hospital, discharge time will be determined by your treatment team.  Patients discharged the day of surgery will not be allowed to drive home and must have someone with them for 24 hours.    Special instructions:   DO NOT smoke tobacco or vape for 24 hours before your procedure.  Please read over the following fact sheets that you were given. Anesthesia Post-op Instructions and Care and Recovery After Surgery        Colonoscopy, Adult, Care After The following information offers guidance on how to care for yourself after your procedure. Your health care provider may also give you more specific instructions. If you have problems or questions, contact your health care provider. What can I expect after the procedure? After the procedure, it is common to have: A small amount of blood in your stool for 24 hours after the procedure. Some gas. Mild cramping or bloating of your abdomen. Follow these instructions at home: Eating and drinking  Drink enough fluid to keep your urine pale yellow. Follow instructions from your health care provider about eating or drinking restrictions.  Resume your normal diet as told by your health care provider. Avoid heavy or fried foods that are hard to digest. Activity Rest as told by your health care provider. Avoid sitting for a long time without moving. Get up to take short walks every 1-2 hours. This is important to improve blood flow and breathing. Ask for help if you feel weak or unsteady. Return to your normal activities as told by your health care provider. Ask your health care provider what activities are safe for you. Managing cramping and bloating  Try walking around when you have cramps or feel bloated. If directed, apply heat to your abdomen as told by your health care provider. Use the heat source that your health care provider recommends, such as a moist heat pack or a heating  pad. Place a towel between your skin and the heat source. Leave the heat on for 20-30 minutes. Remove the heat if your skin turns bright red. This is especially important if you are unable to feel pain, heat, or cold. You have a greater risk of getting burned. General instructions If you were given a sedative during the procedure, it can affect you for several hours. Do not drive or operate machinery until your health care provider says that it is safe. For the first 24 hours after the procedure: Do not sign important documents. Do not drink alcohol. Do your regular daily activities at a slower pace than normal. Eat soft foods that are easy to digest. Take over-the-counter and prescription medicines only as told by your health care provider. Keep all follow-up visits. This is important. Contact a health care provider if: You have blood in your stool 2-3 days after the procedure. Get help right away if: You have more than a small spotting of blood in your stool. You have large blood clots in your stool. You have swelling of your abdomen. You have nausea or vomiting. You have a fever. You have increasing pain in your abdomen that is not relieved with medicine. These symptoms may be an emergency. Get help right away. Call 911. Do not wait to see if the symptoms will go away. Do not drive yourself to the hospital. Summary After the procedure, it is common to have a small amount of blood in your stool. You may also have mild cramping and bloating of your abdomen. If you were given a sedative during the procedure, it can affect you for several hours. Do not drive or operate machinery until your health care provider says that it is safe. Get help right away if you have a lot of blood in your stool, nausea or vomiting, a fever, or increased pain in your abdomen. This information is not intended to replace advice given to you by your health care provider. Make sure you discuss any questions you  have with your health care provider. Document Revised: 08/09/2022 Document Reviewed: 02/17/2021 Elsevier Patient Education  2024 [employer] Monitored Anesthesia Care, Care After The following information offers guidance on how to care for yourself after your procedure. Your health care provider may also give you more specific instructions. If you have problems or questions, contact your health care provider. What can I expect after the procedure? After the procedure, it is common to have: Tiredness. Little or no memory about what happened during or after the procedure. Impaired judgment when it comes to making decisions. Nausea or vomiting. Some trouble with balance. Follow these instructions at home: For the time period you were told  by your health care provider:  Rest. Do not participate in activities where you could fall or become injured. Do not drive or use machinery. Do not drink alcohol. Do not take sleeping pills or medicines that cause drowsiness. Do not make important decisions or sign legal documents. Do not take care of children on your own. Medicines Take over-the-counter and prescription medicines only as told by your health care provider. If you were prescribed antibiotics, take them as told by your health care provider. Do not stop using the antibiotic even if you start to feel better. Eating and drinking Follow instructions from your health care provider about what you may eat and drink. Drink enough fluid to keep your urine pale yellow. If you vomit: Drink clear fluids slowly and in small amounts as you are able. Clear fluids include water , ice chips, low-calorie sports drinks, and fruit juice that has water  added to it (diluted fruit juice). Eat light and bland foods in small amounts as you are able. These foods include bananas, applesauce, rice, lean meats, toast, and crackers. General instructions  Have a responsible adult stay with you for the time you are  told. It is important to have someone help care for you until you are awake and alert. If you have sleep apnea, surgery and some medicines can increase your risk for breathing problems. Follow instructions from your health care provider about wearing your sleep device: When you are sleeping. This includes during daytime naps. While taking prescription pain medicines, sleeping medicines, or medicines that make you drowsy. Do not use any products that contain nicotine  or tobacco. These products include cigarettes, chewing tobacco, and vaping devices, such as e-cigarettes. If you need help quitting, ask your health care provider. Contact a health care provider if: You feel nauseous or vomit every time you eat or drink. You feel light-headed. You are still sleepy or having trouble with balance after 24 hours. You get a rash. You have a fever. You have redness or swelling around the IV site. Get help right away if: You have trouble breathing. You have new confusion after you get home. These symptoms may be an emergency. Get help right away. Call 911. Do not wait to see if the symptoms will go away. Do not drive yourself to the hospital. This information is not intended to replace advice given to you by your health care provider. Make sure you discuss any questions you have with your health care provider. Document Revised: 11/22/2021 Document Reviewed: 11/22/2021 Elsevier Patient Education  2024 [employer].

## 2023-07-20 ENCOUNTER — Encounter: Payer: Self-pay | Admitting: *Deleted

## 2023-07-20 NOTE — Telephone Encounter (Signed)
 Pt called to reschedule his procedure on 07/26/23  until 08/30/23. Updated instructions mailed to pt.

## 2023-07-21 ENCOUNTER — Encounter (HOSPITAL_COMMUNITY)
Admission: RE | Admit: 2023-07-21 | Discharge: 2023-07-21 | Disposition: A | Discharge status: Home / Self Care | Payer: 59 | Source: Ambulatory Visit | Attending: Internal Medicine | Admitting: Internal Medicine

## 2023-07-21 DIAGNOSIS — I10 Essential (primary) hypertension: Secondary | ICD-10-CM

## 2023-07-21 DIAGNOSIS — G894 Chronic pain syndrome: Secondary | ICD-10-CM | POA: Diagnosis not present

## 2023-07-21 DIAGNOSIS — E1159 Type 2 diabetes mellitus with other circulatory complications: Secondary | ICD-10-CM

## 2023-07-21 DIAGNOSIS — M1991 Primary osteoarthritis, unspecified site: Secondary | ICD-10-CM | POA: Diagnosis not present

## 2023-07-21 DIAGNOSIS — E119 Type 2 diabetes mellitus without complications: Secondary | ICD-10-CM

## 2023-07-24 ENCOUNTER — Other Ambulatory Visit: Payer: Self-pay | Admitting: Cardiology

## 2023-08-01 ENCOUNTER — Ambulatory Visit: Payer: 59 | Admitting: "Endocrinology

## 2023-08-02 DIAGNOSIS — M65331 Trigger finger, right middle finger: Secondary | ICD-10-CM | POA: Diagnosis not present

## 2023-08-10 ENCOUNTER — Other Ambulatory Visit: Payer: Self-pay | Admitting: "Endocrinology

## 2023-08-21 ENCOUNTER — Other Ambulatory Visit: Payer: Self-pay | Admitting: "Endocrinology

## 2023-08-21 DIAGNOSIS — G894 Chronic pain syndrome: Secondary | ICD-10-CM | POA: Diagnosis not present

## 2023-08-24 NOTE — Patient Instructions (Signed)
[name]  08/24/2023     @PREFPERIOPPHARMACY @   Your procedure is scheduled on  08/30/2023.   Report to [facility] at  1100  A.M.   Call this number if you have problems the morning of surgery:  [phone]  If you experience any cold or flu symptoms such as cough, fever, chills, shortness of breath, etc. between now and your scheduled surgery, please notify us at the above number.   Remember:        Take 1/2 or your regular dose of lantus the night before your procedure.        DO NOT take any medications for diabetes the morning of your procedure.       Use your nebulizer and your inhalers before you come and bring your rescue inhaler with you.   Follow the diet and prep instructions given to you by the office.   You may drink clear liquids until 0900 am on 08/30/2023.   Clear liquids allowed are:                    Water, Juice (No red color; non-citric and without pulp; diabetics please choose diet or no sugar options), Carbonated beverages (diabetics please choose diet or no sugar options), Clear Tea (No creamer, milk, or cream, including half & half and powdered creamer), Black Coffee Only (No creamer, milk or cream, including half & half and powdered creamer), and Clear Sports drink (No red color; diabetics please choose diet or no sugar options)    Take these medicines the morning of surgery with A SIP OF WATER                        isosorbide, oxycodone (if needed).    Do not wear jewelry, make-up or nail polish, including gel polish,  artificial nails, or any other type of covering on natural nails (fingers and  toes).  Do not wear lotions, powders, or perfumes, or deodorant.  Do not shave 48 hours prior to surgery.  Men may shave face and neck.  Do not bring valuables to the hospital.  [facility] is not responsible for any belongings or valuables.  Contacts, dentures or bridgework may not be worn into surgery.  Leave your suitcase in the car.  After  surgery it may be brought to your room.  For patients admitted to the hospital, discharge time will be determined by your treatment team.  Patients discharged the day of surgery will not be allowed to drive home and must have someone with them for 24 hours.    Special instructions:   DO NOT smoke tobacco or vape for 24 hours before your procedure.  Please read over the following fact sheets that you were given. Anesthesia Post-op Instructions and Care and Recovery After Surgery        Colonoscopy, Adult, Care After The following information offers guidance on how to care for yourself after your procedure. Your health care provider may also give you more specific instructions. If you have problems or questions, contact your health care provider. What can I expect after the procedure? After the procedure, it is common to have: A small amount of blood in your stool for 24 hours after the procedure. Some gas. Mild cramping or bloating of your abdomen. Follow these instructions at home: Eating and drinking  Drink enough fluid to keep your urine pale yellow. Follow instructions from your health care  provider about eating or drinking restrictions. Resume your normal diet as told by your health care provider. Avoid heavy or fried foods that are hard to digest. Activity Rest as told by your health care provider. Avoid sitting for a long time without moving. Get up to take short walks every 1-2 hours. This is important to improve blood flow and breathing. Ask for help if you feel weak or unsteady. Return to your normal activities as told by your health care provider. Ask your health care provider what activities are safe for you. Managing cramping and bloating  Try walking around when you have cramps or feel bloated. If directed, apply heat to your abdomen as told by your health care provider. Use the heat source that your health care provider recommends, such as a moist heat pack or a  heating pad. Place a towel between your skin and the heat source. Leave the heat on for 20-30 minutes. Remove the heat if your skin turns bright red. This is especially important if you are unable to feel pain, heat, or cold. You have a greater risk of getting burned. General instructions If you were given a sedative during the procedure, it can affect you for several hours. Do not drive or operate machinery until your health care provider says that it is safe. For the first 24 hours after the procedure: Do not sign important documents. Do not drink alcohol. Do your regular daily activities at a slower pace than normal. Eat soft foods that are easy to digest. Take over-the-counter and prescription medicines only as told by your health care provider. Keep all follow-up visits. This is important. Contact a health care provider if: You have blood in your stool 2-3 days after the procedure. Get help right away if: You have more than a small spotting of blood in your stool. You have large blood clots in your stool. You have swelling of your abdomen. You have nausea or vomiting. You have a fever. You have increasing pain in your abdomen that is not relieved with medicine. These symptoms may be an emergency. Get help right away. Call 911. Do not wait to see if the symptoms will go away. Do not drive yourself to the hospital. Summary After the procedure, it is common to have a small amount of blood in your stool. You may also have mild cramping and bloating of your abdomen. If you were given a sedative during the procedure, it can affect you for several hours. Do not drive or operate machinery until your health care provider says that it is safe. Get help right away if you have a lot of blood in your stool, nausea or vomiting, a fever, or increased pain in your abdomen. This information is not intended to replace advice given to you by your health care provider. Make sure you discuss any  questions you have with your health care provider. Document Revised: 08/09/2022 Document Reviewed: 02/17/2021 Elsevier Patient Education  2024 [employer]Monitored Anesthesia Care, Care After The following information offers guidance on how to care for yourself after your procedure. Your health care provider may also give you more specific instructions. If you have problems or questions, contact your health care provider. What can I expect after the procedure? After the procedure, it is common to have: Tiredness. Little or no memory about what happened during or after the procedure. Impaired judgment when it comes to making decisions. Nausea or vomiting. Some trouble with balance. Follow these instructions at home: For the  time period you were told by your health care provider:  Rest. Do not participate in activities where you could fall or become injured. Do not drive or use machinery. Do not drink alcohol. Do not take sleeping pills or medicines that cause drowsiness. Do not make important decisions or sign legal documents. Do not take care of children on your own. Medicines Take over-the-counter and prescription medicines only as told by your health care provider. If you were prescribed antibiotics, take them as told by your health care provider. Do not stop using the antibiotic even if you start to feel better. Eating and drinking Follow instructions from your health care provider about what you may eat and drink. Drink enough fluid to keep your urine pale yellow. If you vomit: Drink clear fluids slowly and in small amounts as you are able. Clear fluids include water, ice chips, low-calorie sports drinks, and fruit juice that has water added to it (diluted fruit juice). Eat light and bland foods in small amounts as you are able. These foods include bananas, applesauce, rice, lean meats, toast, and crackers. General instructions  Have a responsible adult stay with you for the  time you are told. It is important to have someone help care for you until you are awake and alert. If you have sleep apnea, surgery and some medicines can increase your risk for breathing problems. Follow instructions from your health care provider about wearing your sleep device: When you are sleeping. This includes during daytime naps. While taking prescription pain medicines, sleeping medicines, or medicines that make you drowsy. Do not use any products that contain nicotine or tobacco. These products include cigarettes, chewing tobacco, and vaping devices, such as e-cigarettes. If you need help quitting, ask your health care provider. Contact a health care provider if: You feel nauseous or vomit every time you eat or drink. You feel light-headed. You are still sleepy or having trouble with balance after 24 hours. You get a rash. You have a fever. You have redness or swelling around the IV site. Get help right away if: You have trouble breathing. You have new confusion after you get home. These symptoms may be an emergency. Get help right away. Call 911. Do not wait to see if the symptoms will go away. Do not drive yourself to the hospital. This information is not intended to replace advice given to you by your health care provider. Make sure you discuss any questions you have with your health care provider. Document Revised: 11/22/2021 Document Reviewed: 11/22/2021 Elsevier Patient Education  2024 [employer].

## 2023-08-28 ENCOUNTER — Encounter (HOSPITAL_COMMUNITY): Payer: Self-pay

## 2023-08-28 ENCOUNTER — Encounter (HOSPITAL_COMMUNITY)
Admission: RE | Admit: 2023-08-28 | Discharge: 2023-08-28 | Disposition: A | Discharge status: Home / Self Care | Payer: 59 | Source: Ambulatory Visit | Attending: Internal Medicine | Admitting: Internal Medicine

## 2023-08-28 VITALS — BP 105/84 | HR 65 | Resp 18 | Ht 73.0 in | Wt 208.6 lb

## 2023-08-28 DIAGNOSIS — M65331 Trigger finger, right middle finger: Secondary | ICD-10-CM | POA: Diagnosis not present

## 2023-08-28 DIAGNOSIS — I209 Angina pectoris, unspecified: Secondary | ICD-10-CM

## 2023-08-28 DIAGNOSIS — I25119 Atherosclerotic heart disease of native coronary artery with unspecified angina pectoris: Secondary | ICD-10-CM | POA: Diagnosis not present

## 2023-08-28 DIAGNOSIS — Z794 Long term (current) use of insulin: Secondary | ICD-10-CM | POA: Insufficient documentation

## 2023-08-28 DIAGNOSIS — Z01818 Encounter for other preprocedural examination: Secondary | ICD-10-CM | POA: Diagnosis present

## 2023-08-28 DIAGNOSIS — I10 Essential (primary) hypertension: Secondary | ICD-10-CM | POA: Insufficient documentation

## 2023-08-28 DIAGNOSIS — E1159 Type 2 diabetes mellitus with other circulatory complications: Secondary | ICD-10-CM | POA: Insufficient documentation

## 2023-08-28 DIAGNOSIS — Z01812 Encounter for preprocedural laboratory examination: Secondary | ICD-10-CM | POA: Insufficient documentation

## 2023-08-28 DIAGNOSIS — I2119 ST elevation (STEMI) myocardial infarction involving other coronary artery of inferior wall: Secondary | ICD-10-CM

## 2023-08-28 HISTORY — DX: Sleep apnea, unspecified: G47.30

## 2023-08-28 LAB — BASIC METABOLIC PANEL
Anion gap: 10 (ref 5–15)
BUN: 9 mg/dL (ref 6–20)
CO2: 21 mmol/L — ABNORMAL LOW (ref 22–32)
Calcium: 8.8 mg/dL — ABNORMAL LOW (ref 8.9–10.3)
Chloride: 105 mmol/L (ref 98–111)
Creatinine, Ser: 0.83 mg/dL (ref 0.61–1.24)
GFR, Estimated: >60 mL/min (ref >60)
Glucose, Bld: 170 mg/dL — ABNORMAL HIGH (ref 70–99)
Potassium: 3.5 mmol/L (ref 3.5–5.1)
Sodium: 136 mmol/L (ref 135–145)

## 2023-08-29 ENCOUNTER — Telehealth: Payer: Self-pay | Admitting: *Deleted

## 2023-08-29 DIAGNOSIS — Z01818 Encounter for other preprocedural examination: Secondary | ICD-10-CM | POA: Diagnosis not present

## 2023-08-29 NOTE — Telephone Encounter (Signed)
Pre-op team,   Patient's colonoscopy for tomorrow was canceled, as patient was found to be in afib at pre-op visit. He is scheduled to see Dr. Wyline Mood in April. Will you please see if this visit can be moved up?   Thank you!  DW

## 2023-08-29 NOTE — Telephone Encounter (Signed)
Received message from endo pre-op that patient was in AFIB at pre-op appt. Will need to see cardiology before proceeding. Patient aware procedure for tomorrow cancelled.     What type of surgery is being performed? COLONOSCOPY  When is surgery scheduled? TBD  What type of clearance is required (medical or pharmacy to hold medication or both? Medical  Name of physician performing surgery?  Dr. Gavin Pot[facility] Phone: [phone] Fax: [phone]  Anethesia type (none, local, MAC, general)? MAC

## 2023-08-29 NOTE — Telephone Encounter (Signed)
 Noted

## 2023-08-29 NOTE — Telephone Encounter (Signed)
Reviewed. Patient will need to follow-up with [place] in the office once he is cleared by cardiology.

## 2023-08-29 NOTE — Telephone Encounter (Signed)
I will send a message to the [facility] Scheduling team to see if they can bring the pt in sooner with Dr. Wyline Mood or APP due to pt is in A-fib, see notes from Carlos Levering, NP.

## 2023-08-30 ENCOUNTER — Encounter (HOSPITAL_COMMUNITY): Admission: RE | Payer: Self-pay | Source: Home / Self Care

## 2023-08-30 ENCOUNTER — Ambulatory Visit (HOSPITAL_COMMUNITY): Admission: RE | Admit: 2023-08-30 | Payer: 59 | Source: Home / Self Care | Admitting: Internal Medicine

## 2023-08-30 SURGERY — COLONOSCOPY WITH PROPOFOL
Anesthesia: Choice

## 2023-08-31 ENCOUNTER — Other Ambulatory Visit: Payer: Self-pay | Admitting: Cardiology

## 2023-08-31 ENCOUNTER — Telehealth: Payer: Self-pay | Admitting: Cardiology

## 2023-08-31 NOTE — Telephone Encounter (Signed)
Left message to call back to schedule appt with APP for preop clearance. Pt can keep his f/u appt as planned 10/2023 with Dr. Wyline Mood.

## 2023-08-31 NOTE — Telephone Encounter (Signed)
No sure who the pt was speaking with, but we will call him back.

## 2023-08-31 NOTE — Telephone Encounter (Signed)
Patient stated he was returning a call but call disconnected abruptly.

## 2023-08-31 NOTE — Telephone Encounter (Signed)
Pt has been scheduled to see Lanora Manis peck, NP 09/01/23. Pt was offered Dr. Wyline Mood 09/04/23 but opts for 09/01/23 appt instead.   I will update all parties involved.

## 2023-09-01 ENCOUNTER — Encounter: Payer: Self-pay | Admitting: Nurse Practitioner

## 2023-09-01 ENCOUNTER — Telehealth: Payer: Self-pay | Admitting: Nurse Practitioner

## 2023-09-01 ENCOUNTER — Ambulatory Visit: Payer: 59 | Attending: Nurse Practitioner | Admitting: Nurse Practitioner

## 2023-09-01 VITALS — BP 110/76 | HR 66 | Ht 73.0 in | Wt 211.2 lb

## 2023-09-01 DIAGNOSIS — I2510 Atherosclerotic heart disease of native coronary artery without angina pectoris: Secondary | ICD-10-CM | POA: Diagnosis not present

## 2023-09-01 DIAGNOSIS — I10 Essential (primary) hypertension: Secondary | ICD-10-CM

## 2023-09-01 DIAGNOSIS — E785 Hyperlipidemia, unspecified: Secondary | ICD-10-CM | POA: Diagnosis not present

## 2023-09-01 DIAGNOSIS — Z01810 Encounter for preprocedural cardiovascular examination: Secondary | ICD-10-CM | POA: Diagnosis not present

## 2023-09-01 DIAGNOSIS — I4891 Unspecified atrial fibrillation: Secondary | ICD-10-CM

## 2023-09-01 NOTE — Patient Instructions (Addendum)
Medication Instructions:  Your physician recommends that you continue on your current medications as directed. Please refer to the Current Medication list given to you today.  Labwork: None   Testing/Procedures: 30 Day Event Monitor Your physician has recommended that you wear an event monitor. Event monitors are medical devices that record the heart's electrical activity. Doctors most often [place] these monitors to diagnose arrhythmias. Arrhythmias are problems with the speed or rhythm of the heartbeat. The monitor is a small, portable device. You can wear one while you do your normal daily activities. This is usually used to diagnose what is causing palpitations/syncope (passing out).  Follow-Up: Your physician recommends that you schedule a follow-up appointment in: 8 weeks   Any Other Special Instructions Will Be Listed Below (If Applicable).  If you need a refill on your cardiac medications before your next appointment, please call your pharmacy.

## 2023-09-01 NOTE — Progress Notes (Signed)
Cardiology Office Note:  .   Date:  09/01/2023  ID:  [name], DOB Jan 25, 1966, MRN [id number] PCP: [name], MD  [facility] Providers Cardiologist:  Dina Rich, MD    History of Present Illness: .   [name] is a 58 y.o. male with a PMH of CAD, hyperlipidemia, hypertension, chronic back pain, presents today for A-fib evaluation.  Previous cardiovascular history of inferior NSTEMI in 2015, received bare-metal stent to RCA.  EF at that time 45 to 50%.  Repeat cardiac catheterization in 2018 showed patent coronaries with patent RCA stent.  NST in 2021 was negative for ischemia.  Last seen by Dr. Dina Rich on January 02, 2023.  Was overall doing well at that time.  Our office has been contacted because he was pending upcoming surgery.  However, this was canceled as he was found to be in A-fib for preop visit.   He presents for evaluation. He states he did not know he was in Atrial Fibrillation at pre-op visit, denies any symptoms.  Tells me he is pending tendon sheath incision for trigger finger on September 06, 2023.  Tells me he was told in the past he needed a CPAP but refused this.  Says he has never been on his CPAP machine. Denies any chest pain, shortness of breath, palpitations, syncope, presyncope, dizziness, orthopnea, PND, swelling or significant weight changes, acute bleeding, or claudication.  ROS: Negative.  See HPI.  Studies Reviewed: Marland Kitchen    EKG: EKG Interpretation Date/Time:  Friday September 01 2023 11:02:30 EST Ventricular Rate:  66 PR Interval:  188 QRS Duration:  82 QT Interval:  404 QTC Calculation: 423 R Axis:   16  Text Interpretation: Normal sinus rhythm with sinus arrhythmia Normal ECG When compared with ECG of 06-Feb-2020 00:44, No significant change was found Confirmed by Sharlene Dory [phone]) on 09/01/2023 11:19:10 AM   Lexiscan 03/2020:  No diagnostic ST segment changes to indicate ischemia. Medium sized, moderate intensity, fixed  apical to basal inferior defect that is more prominent at rest and consistent with soft tissue attenuation versus scar. No substantial ischemic territories. This is a low risk study. Nuclear stress EF: 51%.  LHC 04/2017:  Angiographically normal coronary arteries Dist RCA BMS (VeriFlex 5.0 x 15), 0 %stenosed. The left ventricular systolic function is normal. The left ventricular ejection fraction is 50-55% by visual estimate. LV end diastolic pressure is moderately elevated.   Patient essentially has intra-graphic normal coronary arteries with widely RCA.  No culprit lesion to explain inferior ischemia. Elevated LVEDP could explain microvascular ischemia.   Otherwise consider non-anginal etiology for chest pain.  Physical Exam:   VS:  BP 110/76   Pulse 66   Ht 6\' 1"  (1.854 m)   Wt 211 lb 3.2 oz (95.8 kg)   SpO2 94%   BMI 27.86 kg/m    Wt Readings from Last 3 Encounters:  09/01/23 211 lb 3.2 oz (95.8 kg)  08/28/23 208 lb 8.9 oz (94.6 kg)  06/01/23 208 lb 9.6 oz (94.6 kg)    GEN: Well nourished, well developed in no acute distress NECK: No JVD; No carotid bruits CARDIAC: S1/S2, RRR, no murmurs, rubs, gallops RESPIRATORY:  Clear to auscultation without rales, wheezing or rhonchi  ABDOMEN: Soft, non-tender, non-distended EXTREMITIES:  No edema; No deformity   ASSESSMENT AND PLAN: .    Atrial fibrillation? Diagnosed with A-fib at preop visit, was recommended follow-up with outpatient cardiology for further evaluation.  I do not have  EKG on file to confirm that this was indeed A-fib.  He is in sinus rhythm today on EKG.  Will arrange 30-day event monitor to evaluate for A-fib.  He denies any symptoms, says he did not know he was in A-fib at time of preop visit.  If monitor confirms A-fib, will require oral anticoagulation.  Heart rate is well-controlled today.  No medication changes at this time. Heart healthy diet and regular cardiovascular exercise encouraged.   CAD Stable with  no anginal symptoms. No indication for ischemic evaluation.  Left heart cath in 2018 showed normal coronary arteries.  Continue aspirin, atorvastatin, lisinopril, Vascepa, and nitroglycerin as needed.  HTN Blood pressure stable. Discussed to monitor BP at home at least 2 hours after medications and sitting for 5-10 minutes.  No medication changes at this time. Heart healthy diet and regular cardiovascular exercise encouraged.   HLD LDL 86 from August 2024.  Continue current medication regimen. Heart healthy diet and regular cardiovascular exercise encouraged.    Pre-op cardiovascular risk assessment Discussed that he will need to be evaluated for A-fib first prior to proceeding with upcoming surgery.  He verbalized understanding.  Will address at next office visit after we get monitor results back.    Dispo: Follow-up with me/APP in 8 weeks or sooner anything changes.  Signed, Sharlene Dory, NP

## 2023-09-01 NOTE — Telephone Encounter (Signed)
Checking percert on the following patient    30 day cardiac event monitor preventice

## 2023-09-04 ENCOUNTER — Encounter: Payer: Self-pay | Admitting: Family Medicine

## 2023-09-07 DIAGNOSIS — I480 Paroxysmal atrial fibrillation: Secondary | ICD-10-CM | POA: Diagnosis not present

## 2023-09-13 ENCOUNTER — Telehealth: Payer: Self-pay | Admitting: Nurse Practitioner

## 2023-09-13 DIAGNOSIS — I4891 Unspecified atrial fibrillation: Secondary | ICD-10-CM

## 2023-09-13 NOTE — Telephone Encounter (Signed)
 Patient stated that he did not want to wear monitor so patient removed monitor and is sending it back.

## 2023-09-13 NOTE — Telephone Encounter (Signed)
 Pt called in stating his heart monitor "wasn't any good". He states he just took it off and put it back in the box. Please advise.

## 2023-09-18 DIAGNOSIS — G894 Chronic pain syndrome: Secondary | ICD-10-CM | POA: Diagnosis not present

## 2023-09-20 ENCOUNTER — Ambulatory Visit: Attending: Nurse Practitioner

## 2023-09-20 DIAGNOSIS — I4891 Unspecified atrial fibrillation: Secondary | ICD-10-CM

## 2023-09-20 NOTE — Telephone Encounter (Signed)
 Patient agrees to wear 3 day Zio. It will be mailed to his home.

## 2023-10-06 ENCOUNTER — Telehealth: Payer: Self-pay | Admitting: *Deleted

## 2023-10-06 DIAGNOSIS — I4891 Unspecified atrial fibrillation: Secondary | ICD-10-CM | POA: Diagnosis not present

## 2023-10-06 NOTE — Telephone Encounter (Signed)
   Pre-operative Risk Assessment    Patient Name: [name]  DOB: 1965/07/21 MRN: [id number]   Date of last office visit: 09/01/2023 Date of next office visit: 10/31/2023   Request for Surgical Clearance    Procedure:   right middle trigger finger release   Date of Surgery:  Clearance TBD                                Surgeon:  not listed  Surgeon's Group or Practice Name:  [facility] Phone number:  [phone] Fax number:  [phone]   Type of Clearance Requested:   - Medical    Type of Anesthesia:  Not Indicated   Additional requests/questions:    Elvin So   10/06/2023, 11:18 AM

## 2023-10-09 NOTE — Telephone Encounter (Signed)
   Patient Name: [name]  DOB: 1965/10/08 MRN: [id number]  Primary Cardiologist: Dina Rich, MD  Chart reviewed as part of pre-operative protocol coverage.   Helping in preop today. Per Elizabeth's note 08/2023 "Pre-op cardiovascular risk assessment: Discussed that he will need to be evaluated for A-fib first prior to proceeding with upcoming surgery.  He verbalized understanding.  Will address at next office visit after we get monitor results back."  Patient has appt scheduled to address 10/31/23. Added preop reminder to appt notes.Will remove from preop box and cc: Lanora Manis just as FYI reminder to address at upcoming OV.  Will route update to requesting provider via Epic fax function. Please call with questions.  Laurann Montana, PA-C 10/09/2023, 4:31 PM

## 2023-10-11 ENCOUNTER — Telehealth: Payer: Self-pay | Admitting: Cardiology

## 2023-10-11 DIAGNOSIS — I4891 Unspecified atrial fibrillation: Secondary | ICD-10-CM

## 2023-10-11 NOTE — Telephone Encounter (Signed)
 Pt calling in regards to Heart Monitor results. Please advise

## 2023-10-11 NOTE — Progress Notes (Addendum)
 This patients CHA2DS2-VASc Score and unadjusted Ischemic Stroke Rate (% per year) is equal to 3.2 % stroke rate/year from a score of 3  Above score calculated as 1 point each if present [CHF, HTN, DM, Vascular=MI/PAD/Aortic Plaque, Age if 65-74, or Male] Above score calculated as 2 points each if present [Age > 75, or Stroke/TIA/TE]  Due to history of PAF, recommend Eliquis 5 mg twice daily to be initiated and stopping his baby Aspirin.  Sharlene Dory, NP

## 2023-10-12 ENCOUNTER — Other Ambulatory Visit: Payer: Self-pay | Admitting: "Endocrinology

## 2023-10-12 MED ORDER — APIXABAN 5 MG PO TABS: 5.0000 mg | ORAL_TABLET | Freq: Two times a day (BID) | ORAL | 5 refills | Status: AC

## 2023-10-12 NOTE — Telephone Encounter (Signed)
 Patient informed and verbalized understanding of plan. Labs sent to [facility]  Rx sent to pharmacy

## 2023-10-16 DIAGNOSIS — I4891 Unspecified atrial fibrillation: Secondary | ICD-10-CM

## 2023-10-19 DIAGNOSIS — I4891 Unspecified atrial fibrillation: Secondary | ICD-10-CM | POA: Diagnosis not present

## 2023-10-19 NOTE — Telephone Encounter (Signed)
   Name: [name]  DOB: 06/09/1966  MRN: [id number]  Primary Cardiologist: Dina Rich, MD  Chart reviewed as part of pre-operative protocol coverage. The patient has an upcoming visit scheduled with Sharlene Dory, NP on 11/03/23  at which time clearance can be addressed in case there are any issues that would impact surgical recommendations. I added preop FYI to appointment note so that provider is aware to address at time of outpatient visit.  Per office protocol the cardiology provider should forward their finalized clearance decision and recommendations regarding antiplatelet therapy to the requesting party below.    I will route this message as FYI to requesting party and remove this message from the preop box as separate preop APP input not needed at this time.   Please call with any questions.  Napoleon Form, Leodis Rains, NP  10/19/2023, 3:22 PM

## 2023-10-20 ENCOUNTER — Encounter: Payer: Self-pay | Admitting: *Deleted

## 2023-10-20 ENCOUNTER — Encounter: Payer: Self-pay | Admitting: Nurse Practitioner

## 2023-10-20 ENCOUNTER — Telehealth: Payer: Self-pay | Admitting: *Deleted

## 2023-10-20 ENCOUNTER — Ambulatory Visit: Attending: Internal Medicine | Admitting: *Deleted

## 2023-10-20 VITALS — BP 110/64 | HR 100 | Ht 73.0 in | Wt 214.2 lb

## 2023-10-20 DIAGNOSIS — I4891 Unspecified atrial fibrillation: Secondary | ICD-10-CM | POA: Diagnosis not present

## 2023-10-20 LAB — BASIC METABOLIC PANEL WITH GFR
BUN/Creatinine Ratio: 16 (ref 9–20)
BUN: 12 mg/dL (ref 6–24)
CO2: 18 mmol/L — ABNORMAL LOW (ref 20–29)
Calcium: 9.1 mg/dL (ref 8.7–10.2)
Chloride: 102 mmol/L (ref 96–106)
Creatinine, Ser: 0.74 mg/dL — ABNORMAL LOW (ref 0.76–1.27)
Glucose: 145 mg/dL — ABNORMAL HIGH (ref 70–99)
Potassium: 4 mmol/L (ref 3.5–5.2)
Sodium: 138 mmol/L (ref 134–144)
eGFR: 105 mL/min/{1.73_m2} (ref >59)

## 2023-10-20 LAB — CBC
Hematocrit: 48.1 % (ref 37.5–51.0)
Hemoglobin: 16.6 g/dL (ref 13.0–17.7)
MCH: 31.3 pg (ref 26.6–33.0)
MCHC: 34.5 g/dL (ref 31.5–35.7)
MCV: 91 fL (ref 79–97)
Platelets: 169 10*3/uL (ref 150–450)
RBC: 5.31 x10E6/uL (ref 4.14–5.80)
RDW: 13 % (ref 11.6–15.4)
WBC: 9.2 10*3/uL (ref 3.4–10.8)

## 2023-10-20 MED ORDER — METOPROLOL SUCCINATE ER 25 MG PO TB24: 12.5000 mg | ORAL_TABLET | Freq: Every day | ORAL | 1 refills | Status: DC

## 2023-10-20 NOTE — Telephone Encounter (Signed)
 Patient informed and verbalized understanding of plan.

## 2023-10-20 NOTE — Telephone Encounter (Signed)
 Patient is returning call.

## 2023-10-20 NOTE — Telephone Encounter (Signed)
-----   Message from Sharlene Dory sent at 10/20/2023 12:37 PM EDT ----- Thanks for seeing him. Will benefit from heart rate control, HR is on upper end of normal. Please stop Lisinopril and begin Toprol XL 12.5 mg daily. He needs to log and monitor his HR and BP. Recommend repeating EKG in [redacted] week along with his vital signs to see how he is handling the medication.   Keep follow-up as scheduled. Will CC Dr. Wyline Mood so he is aware.   Thanks!   Best, Sharlene Dory, NP ----- Message ----- From: Eustace Moore, RN Sent: 10/20/2023   8:57 AM EDT To: Sharlene Dory, NP

## 2023-10-20 NOTE — Patient Instructions (Signed)
Your physician recommends that you continue on your current medications as directed. Please refer to the Current Medication list given to you today. We will contact you after your provider reviews your visit

## 2023-10-20 NOTE — Progress Notes (Signed)
 Presents for nurse visit per recent phone note and results of heart monitor with home BP and HR readings. Medications reviewed. Reports taking all medications as prescribed without side effects. Denies dizziness, chest pain or SOB. EKG and vitals done and routed to provider for review. Home BP & HR log scanned to provider for review.

## 2023-10-23 ENCOUNTER — Other Ambulatory Visit: Payer: Self-pay | Admitting: Cardiology

## 2023-10-24 ENCOUNTER — Ambulatory Visit: Payer: 59 | Admitting: Cardiology

## 2023-10-24 NOTE — Progress Notes (Addendum)
 10/20/2023 - pt came in for RN visit. EKG reviewed that date and revealed rate controlled A-fib. See most recent telephone note.   Lasalle Pointer, NP

## 2023-10-30 DIAGNOSIS — G894 Chronic pain syndrome: Secondary | ICD-10-CM | POA: Diagnosis not present

## 2023-10-31 ENCOUNTER — Encounter: Payer: Self-pay | Admitting: Nurse Practitioner

## 2023-10-31 ENCOUNTER — Ambulatory Visit: Payer: 59 | Attending: Nurse Practitioner | Admitting: Nurse Practitioner

## 2023-10-31 VITALS — BP 132/86 | HR 57 | Ht 73.0 in | Wt 216.8 lb

## 2023-10-31 DIAGNOSIS — I5022 Chronic systolic (congestive) heart failure: Secondary | ICD-10-CM

## 2023-10-31 DIAGNOSIS — I2510 Atherosclerotic heart disease of native coronary artery without angina pectoris: Secondary | ICD-10-CM | POA: Diagnosis not present

## 2023-10-31 DIAGNOSIS — I10 Essential (primary) hypertension: Secondary | ICD-10-CM | POA: Diagnosis not present

## 2023-10-31 DIAGNOSIS — I4891 Unspecified atrial fibrillation: Secondary | ICD-10-CM | POA: Diagnosis not present

## 2023-10-31 DIAGNOSIS — E785 Hyperlipidemia, unspecified: Secondary | ICD-10-CM

## 2023-10-31 DIAGNOSIS — I255 Ischemic cardiomyopathy: Secondary | ICD-10-CM

## 2023-10-31 DIAGNOSIS — Z01810 Encounter for preprocedural cardiovascular examination: Secondary | ICD-10-CM | POA: Diagnosis not present

## 2023-10-31 DIAGNOSIS — I480 Paroxysmal atrial fibrillation: Secondary | ICD-10-CM

## 2023-10-31 NOTE — Patient Instructions (Addendum)

## 2023-10-31 NOTE — Progress Notes (Unsigned)
 Cardiology Office Note:  .   Date: 10/31/2023 ID:  [name], DOB [birth year], MRN [id number] PCP: Minus Amel, MD  [facility] Providers Cardiologist:  Armida Lander, MD    History of Present Illness: .   [name] is a 58 y.o. male with a PMH of CAD, hyperlipidemia, hypertension, chronic back pain, presents today for A-fib evaluation.  Previous cardiovascular history of inferior NSTEMI in 2015, received bare-metal stent to RCA.  EF at that time 45 to 50%.  Repeat cardiac catheterization in 2018 showed patent coronaries with patent RCA stent.  NST in 2021 was negative for ischemia.  Last seen by Dr. Armida Lander on January 02, 2023.  Was overall doing well at that time.  Our office has been contacted because he was pending upcoming surgery.  However, this was canceled as he was found to be in A-fib for preop visit.   He presents for evaluation. He states he did not know he was in Atrial Fibrillation at pre-op visit, denies any symptoms.  Tells me he is pending tendon sheath incision for trigger finger on September 06, 2023.  Tells me he was told in the past he needed a CPAP but refused this.  Says he has never been on his CPAP machine. Denies any chest pain, shortness of breath, palpitations, syncope, presyncope, dizziness, orthopnea, PND, swelling or significant weight changes, acute bleeding, or claudication.  10/31/2023 -he presents today for follow-up.  Doing well.  Denies any acute changes to his health since I last saw him. Denies any chest pain, shortness of breath, palpitations, syncope, presyncope, dizziness, orthopnea, PND, swelling or significant weight changes, acute bleeding, or claudication.  Tolerating his medications well.  ROS: Negative.  See HPI.  Studies Reviewed: Aaron Aas    EKG: EKG Interpretation Date/Time:  Tuesday October 31 2023 09:54:23 EDT Ventricular Rate:  57 PR Interval:  190 QRS Duration:  82 QT Interval:  424 QTC Calculation: 412 R  Axis:   23  Text Interpretation: Sinus bradycardia When compared with ECG of 20-Oct-2023 08:55, Sinus rhythm has replaced Atrial fibrillation Vent. rate has decreased BY  42 BPM Nonspecific T wave abnormality no longer evident in Lateral leads Confirmed by Lasalle Pointer [phone]) on 10/31/2023 10:32:10 AM   Cardiac monitor 10/2023:    2 day monitor   Rare supraventricular ectopy in the form of isolatated PACs, couplets   Rare ventricular ectopy in the form of isolated PVCs, couplets   Episodes of afib during study. Computer generated report indicates 100% burden however there are times where patient is in normal sinus rhythm. Rates were 56 to 192, average rates 99   Device likely positioned incorrectly, abnormal QRS and T wave axis   There are patient triggered events but not reported symptoms included. Tracings show sinus rhythm and afib.     Patch Wear Time:  2 days and 13 hours (2025-03-17T10:47:32-0400 to 2025-03-19T23:54:51-0400)   Atrial Fibrillation occurred continuously (100% burden), ranging from 56-192 bpm (avg of 99 bpm). Atrial Fibrillation was present at activation of device. Isolated VEs were rare (<1.0%), VE Couplets were rare (<1.0%), and no VE Triplets were present.  Inverted QRS complexes possibly due to inverted placement of device. Difficulty discerning atrial activity making definitive diagnosis difficult to ascertain.  Lexiscan  03/2020:  No diagnostic ST segment changes to indicate ischemia. Medium sized, moderate intensity, fixed apical to basal inferior defect that is more prominent at rest and consistent with soft tissue attenuation versus scar. No substantial ischemic territories.  This is a low risk study. Nuclear stress EF: 51%.  LHC 04/2017:  Angiographically normal coronary arteries Dist RCA BMS (VeriFlex 5.0 x 15), 0 %stenosed. The left ventricular systolic function is normal. The left ventricular ejection fraction is 50-55% by visual estimate. LV end diastolic  pressure is moderately elevated.   Patient essentially has intra-graphic normal coronary arteries with widely RCA.  No culprit lesion to explain inferior ischemia. Elevated LVEDP could explain microvascular ischemia.   Otherwise consider non-anginal etiology for chest pain.  Physical Exam:   VS:  BP 132/86   Pulse (!) 57   Ht 6\' 1"  (1.854 m)   Wt 216 lb 12.8 oz (98.3 kg)   SpO2 98%   BMI 28.60 kg/m    Wt Readings from Last 3 Encounters:  10/31/23 216 lb 12.8 oz (98.3 kg)  10/20/23 214 lb 3.2 oz (97.2 kg)  09/01/23 211 lb 3.2 oz (95.8 kg)    GEN: Well nourished, well developed in no acute distress NECK: No JVD; No carotid bruits CARDIAC: S1/S2, RRR, no murmurs, rubs, gallops RESPIRATORY:  Clear to auscultation without rales, wheezing or rhonchi  ABDOMEN: Soft, non-tender, non-distended EXTREMITIES:  No edema; No deformity   ASSESSMENT AND PLAN: .    PAF Diagnosed originally with A-fib at preop visit.  See most recent heart monitor that showed episodes of A-fib during study.  Appears he has paroxysmal A-fib, asymptomatic regarding this.  Today he is in sinus bradycardia, asymptomatic.  Currently tolerating his medications well.  Continue Eliquis  5 mg twice daily for stroke prevention.  Continue Toprol -XL for rate control. Heart healthy diet and regular cardiovascular exercise encouraged.  Will update echocardiogram to be done as soon as possible before cardiac clearance can be given.  CAD, hx of HFmrEF/ICM Stable with no anginal symptoms. No indication for ischemic evaluation. Past mildly reduced EF in 2015 during time of NSTEMI, no recent Echo since then.  Left heart cath in 2018 showed normal coronary arteries. Euvolemic and well compensated on exam.  No longer on aspirin  due to being on Eliquis .  Continue atorvastatin , Toprol -XL, Vascepa , and nitroglycerin  as needed. Low sodium diet, fluid restriction <2L, and daily weights encouraged. Educated to contact our office for weight gain  of 2 lbs overnight or 5 lbs in one week. Will update Echo at this time.   HTN Blood pressure mildly elevated, tells me normally BP is well-controlled per his report.  Discussed SBP goal is less than 130.  Does admit to some dietary indiscretions.  Discussed to monitor BP at home at least 2 hours after medications and sitting for 5-10 minutes.  No medication changes at this time. Low salt, heart healthy diet and regular cardiovascular exercise encouraged.   HLD LDL 86 from August 2024.  Continue current medication regimen. Heart healthy diet and regular cardiovascular exercise encouraged.   Pre-op cardiovascular risk assessment Mr. Angulo's perioperative risk of a major cardiac event is 11% according to the Revised Cardiac Risk Index (RCRI).  Therefore, he is at high risk for perioperative complications.   His functional capacity is excellent at 6.61 METs according to the Duke Activity Status Index (DASI). Recommendations: According to ACC/AHA guidelines, no further cardiovascular testing needed.  The patient may proceed to surgery at acceptable risk.   Antiplatelet and/or Anticoagulation Recommendations: Per office protocol, patient can hold Eliquis  for 2 days prior to procedure. Will route note to requesting party.   Addendum: Recent Echo was overall benign. Per office protocol, patient can hold Eliquis  for  2 days prior to procedure.    Dispo: Follow-up with Dr. Armida Lander or APP in 6 months or sooner anything changes.  Signed, Lasalle Pointer, NP

## 2023-11-01 ENCOUNTER — Ambulatory Visit: Attending: Nurse Practitioner

## 2023-11-01 DIAGNOSIS — I4891 Unspecified atrial fibrillation: Secondary | ICD-10-CM

## 2023-11-01 LAB — ECHOCARDIOGRAM COMPLETE
AR max vel: 3.14 cm2
AV Area VTI: 2.77 cm2
AV Area mean vel: 2.97 cm2
AV Mean grad: 6 mmHg
AV Peak grad: 10.1 mmHg
Ao pk vel: 1.59 m/s
Area-P 1/2: 2.86 cm2
Calc EF: 68.2 %
MV VTI: 2.9 cm2
S' Lateral: 2.7 cm
Single Plane A2C EF: 62.7 %
Single Plane A4C EF: 71.8 %

## 2023-11-02 ENCOUNTER — Telehealth: Payer: Self-pay | Admitting: Pharmacist

## 2023-11-02 NOTE — Telephone Encounter (Signed)
 Patient with diagnosis of PAF on Eliquis  for anticoagulation.    Procedure: trigger finger surgery Date of procedure: TBD   CHA2DS2-VASc Score = 3  This indicates a 3.2% annual risk of stroke. The patient's score is based upon: CHF History: 0 HTN History: 1 Diabetes History: 1 Stroke History: 0 Vascular Disease History: 1 Age Score: 0 Gender Score: 0    CrCl 149 lm/min Platelet count 169K   Per office protocol, patient can hold Eliquis  for 2 days prior to procedure.     **This guidance is not considered finalized until pre-operative APP has relayed final recommendations.**

## 2023-11-02 NOTE — Telephone Encounter (Signed)
-----   Message from Lasalle Pointer sent at 11/02/2023  3:18 PM EDT ----- Please comment on how long patient needs to hold Eliquis  prior to trigger finger surgery that is upcoming.  Thanks!   Best, Lasalle Pointer, NP

## 2023-11-06 DIAGNOSIS — M720 Palmar fascial fibromatosis [Dupuytren]: Secondary | ICD-10-CM | POA: Diagnosis not present

## 2023-11-06 DIAGNOSIS — M65331 Trigger finger, right middle finger: Secondary | ICD-10-CM | POA: Diagnosis not present

## 2023-11-06 NOTE — Telephone Encounter (Signed)
 Apologize. Sent the wrong clearance encounter to you.

## 2023-11-06 NOTE — Telephone Encounter (Signed)
 Mindy, looks like this clearance was specifically for trigger finger surgery. No request from us . Looks like he was previously advised to follow-up in clinica to reschedule his colonoscopy once cleared by cardiology. We can arrange follow-up if he is ready.

## 2023-11-06 NOTE — Telephone Encounter (Signed)
 Yes, he just needs an OV with us  to get TCS rescheduled.

## 2023-11-07 NOTE — Telephone Encounter (Signed)
 Called to schedule the patient's office visit and left a message asking him to call back to schedule before the procedure is rescheduled

## 2023-11-08 DIAGNOSIS — Z01818 Encounter for other preprocedural examination: Secondary | ICD-10-CM | POA: Diagnosis not present

## 2023-11-08 NOTE — Progress Notes (Deleted)
 Referring Provider: Minus Amel, MD Primary Care Physician:  Minus Amel, MD Primary GI Physician: Dr. Jenney Modest chief complaint on file.   HPI:   [name] is a 58 y.o. male  with history of anxiety, diabetes, chronic back pain, HTN, HLD, MI, pancreatitis, chronic constipation, adenomatous colon polyps, presenting today to discuss re-scheduling his colonoscopy.   Last seen in the office 06/01/2023.  Chronic constipation was well-controlled with Linzess  72 mcg as needed.  No other concerning GI symptoms.  He was scheduled for his surveillance colonoscopy.  Patient's colonoscopy was canceled in February as he was found to be in A-fib at preop.  He was evaluated by cardiology with A-fib diagnosis confirmed.  He was already on Eliquis  5 mg twice daily and Toprol  for rate control. Echocardiogram 11/01/23 with normal LVEF.  Grade 1 diastolic dysfunction.  Cardiac clearance was given with okay to hold Eliquis  for 2 days prior to procedure.  Today:   Past Medical History:  Diagnosis Date   Anxiety    Borderline diabetes    Bulging disc    Chronic back pain    Closed intra-articular fracture of distal end of left tibia    Diabetes mellitus without complication (HCC)    HTN (hypertension)    Hypercholesteremia    Myocardial infarction [facility])    STEMI 2015 with BMS to RCA   Pancreatitis    Sleep apnea     Past Surgical History:  Procedure Laterality Date   ABDOMINAL SURGERY  1990   adhesions, blockage.    APPENDECTOMY     BACK SURGERY     CARDIAC CATHETERIZATION  2003   "trivial coronary artery disease," normal EF   COLONOSCOPY N/A 04/16/2015   Procedure: COLONOSCOPY;  Surgeon: Suzette Espy, MD;  Location: [facility];  Service: Endoscopy;  Laterality: N/A;  12:45 PM   COLONOSCOPY WITH PROPOFOL  N/A 06/28/2018   Rourk: Diverticulosis, 2 tubular adenomas removed.  Next colonoscopy in 5 years.   LEFT HEART CATH AND CORONARY ANGIOGRAPHY N/A 05/09/2017   Procedure: LEFT  HEART CATH AND CORONARY ANGIOGRAPHY;  Surgeon: Arleen Lacer, MD;  Location[facility] INVASIVE CV LAB;  Service: Cardiovascular;  Laterality: N/A;   LEFT HEART CATHETERIZATION WITH CORONARY ANGIOGRAM N/A 03/13/2014   Procedure: LEFT HEART CATHETERIZATION WITH CORONARY ANGIOGRAM;  Surgeon: Arleen Lacer, MD;  Location[facility] C[facility] LAB;  Service: Cardiovascular;  Laterality: N/A;   OPEN REDUCTION INTERNAL FIXATION (ORIF) TIBIA/FIBULA FRACTURE Left 10/01/2018   Procedure: OPEN REDUCTION INTERNAL FIXATION LEFT PILON FRACTURE;  Surgeon: Hardy Lia, MD;  Location: [facility] OR;  Service: Orthopedics;  Laterality: Left;   POLYPECTOMY  06/28/2018   Procedure: POLYPECTOMY;  Surgeon: Suzette Espy, MD;  Location: [facility];  Service: Endoscopy;;  (colon)    Current Outpatient Medications  Medication Sig Dispense Refill   Accu-Chek Softclix Lancets lancets USE TO TEST BLOOD SUGAR TWICE DAILY 100 each 3   albuterol  (PROVENTIL ) (2.5 MG/3ML) 0.083% nebulizer solution Take 3 mLs (2.5 mg total) by nebulization every 6 (six) hours as needed for wheezing or shortness of breath. 75 mL 0   Albuterol  Sulfate (PROAIR  HFA IN) Inhale 2 puffs into the lungs as needed.     apixaban  (ELIQUIS ) 5 MG TABS tablet Take 1 tablet (5 mg total) by mouth 2 (two) times daily. 60 tablet 5   atorvastatin  (LIPITOR) 80 MG tablet TAKE 1 TABLET(80 MG) BY MOUTH AT BEDTIME 90 tablet 0   Blood Glucose Monitoring Suppl (  ACCU-CHEK GUIDE ME) w/Device KIT USE TO CHECK BLOOD GLUCOSE 1 kit 0   Continuous Glucose Receiver (FREESTYLE LIBRE 3 READER) DEVI Use as directed to test BG 1 each 0   Continuous Glucose Sensor (FREESTYLE LIBRE 3 PLUS SENSOR) MISC Change sensor every 15 days. 6 each 1   CRANBERRY PO Take 1 tablet by mouth every 14 (fourteen) days.     furosemide  (LASIX ) 20 MG tablet TAKE 1 TABLET(20 MG) BY MOUTH DAILY AS NEEDED 90 tablet 1   glucose blood (ACCU-CHEK GUIDE TEST) test strip USE TO CHECK BLOOD SUGAR TWICE DAILY 100 strip 0   Insulin   Pen Needle (B-D UF III MINI PEN NEEDLES) 31G X 5 MM MISC USE TO INJECT INSULIN  EVERY DAY 100 each 1   Insulin  Pen Needle (B-D ULTRAFINE III SHORT PEN) 31G X 8 MM MISC USE WITH INSULIN  EVERY DAY 100 each 3   isosorbide  mononitrate (IMDUR ) 60 MG 24 hr tablet TAKE 1 TABLET BY MOUTH DAILY 90 tablet 1   LANTUS  SOLOSTAR 100 UNIT/ML Solostar Pen Inject 50 Units into the skin at bedtime. 45 mL 0   LINZESS  72 MCG capsule TAKE 1 CAPSULE(72 MCG) BY MOUTH DAILY BEFORE BREAKFAST 30 capsule 5   metFORMIN  (GLUCOPHAGE ) 1000 MG tablet Take 1,000 mg by mouth 2 (two) times daily.     metoprolol  succinate (TOPROL  XL) 25 MG 24 hr tablet Take 0.5 tablets (12.5 mg total) by mouth daily. 45 tablet 1   Multiple Vitamin (MULTIVITAMIN) tablet Take 1 tablet by mouth daily.     nitroGLYCERIN  (NITROSTAT ) 0.4 MG SL tablet Place 1 tablet (0.4 mg total) under the tongue every 5 (five) minutes x 3 doses as needed for chest pain (if no relief after 2nd dose, proceed to the ED or call 911). 25 tablet 3   Omega-3 Fatty Acids (SALMON OIL-1000 PO) Take 1 capsule by mouth daily.     oxyCODONE -acetaminophen  (PERCOCET) 5-325 MG tablet Take 1 tablet by mouth every 6 (six) hours as needed. 20 tablet 0   VASCEPA  1 g capsule TAKE 2 CAPSULES(2 GRAMS) BY MOUTH TWICE DAILY 120 capsule 6   zolpidem (AMBIEN) 10 MG tablet Take 10 mg by mouth at bedtime as needed.     No current facility-administered medications for this visit.    Allergies as of 11/09/2023   (No Known Allergies)    Family History  Problem Relation Age of Onset   Hypertension Mother    Hyperlipidemia Mother    Diabetes Father    Hyperlipidemia Father    Hypertension Father    Hyperlipidemia Sister    Hypertension Sister    Diabetes Sister    Colon cancer Paternal Uncle        over age 28    Social History   Socioeconomic History   Marital status: Single    Spouse name: Not on file   Number of children: Not on file   Years of education: Not on file   Highest  education level: Not on file  Occupational History   Not on file  Tobacco Use   Smoking status: Every Day    Current packs/day: 0.50    Average packs/day: 0.5 packs/day for 46.0 years (23.0 ttl pk-yrs)    Types: Cigarettes    Start date: 10/25/1977   Smokeless tobacco: Never  Vaping Use   Vaping status: Never Used  Substance and Sexual Activity   Alcohol use: Not Currently    Alcohol/week: 6.0 standard drinks of alcohol  Types: 6 Cans of beer per week    Comment: denied 06/08/21   Drug use: Yes    Types: Marijuana   Sexual activity: Yes  Other Topics Concern   Not on file  Social History Narrative   Not on file   Social Drivers of Health   Financial Resource Strain: Not on file  Food Insecurity: Not on file  Transportation Needs: Not on file  Physical Activity: Not on file  Stress: Not on file  Social Connections: Not on file    Review of Systems: Gen: Denies fever, chills, anorexia. Denies fatigue, weakness, weight loss.  CV: Denies chest pain, palpitations, syncope, peripheral edema, and claudication. Resp: Denies dyspnea at rest, cough, wheezing, coughing up blood, and pleurisy. GI: Denies vomiting blood, jaundice, and fecal incontinence.   Denies dysphagia or odynophagia. Derm: Denies rash, itching, dry skin Psych: Denies depression, anxiety, memory loss, confusion. No homicidal or suicidal ideation.  Heme: Denies bruising, bleeding, and enlarged lymph nodes.  Physical Exam: There were no vitals taken for this visit. General:   Alert and oriented. No distress noted. Pleasant and cooperative.  Head:  Normocephalic and atraumatic. Eyes:  Conjuctiva clear without scleral icterus. Heart:  S1, S2 present without murmurs appreciated. Lungs:  Clear to auscultation bilaterally. No wheezes, rales, or rhonchi. No distress.  Abdomen:  +BS, soft, non-tender and non-distended. No rebound or guarding. No HSM or masses noted. Msk:  Symmetrical without gross deformities.  Normal posture. Extremities:  Without edema. Neurologic:  Alert and  oriented x4 Psych:  Normal mood and affect.    Assessment:     Plan:  ***   Shana Daring, PA-C [facility] 11/09/2023

## 2023-11-09 ENCOUNTER — Ambulatory Visit: Admitting: Gastroenterology

## 2023-11-14 ENCOUNTER — Other Ambulatory Visit

## 2023-11-14 ENCOUNTER — Encounter: Payer: Self-pay | Admitting: Gastroenterology

## 2023-11-15 DIAGNOSIS — J432 Centrilobular emphysema: Secondary | ICD-10-CM | POA: Diagnosis not present

## 2023-11-15 DIAGNOSIS — E785 Hyperlipidemia, unspecified: Secondary | ICD-10-CM | POA: Diagnosis not present

## 2023-11-15 DIAGNOSIS — M65311 Trigger thumb, right thumb: Secondary | ICD-10-CM | POA: Diagnosis not present

## 2023-11-15 DIAGNOSIS — I700 Atherosclerosis of aorta: Secondary | ICD-10-CM | POA: Diagnosis not present

## 2023-11-15 DIAGNOSIS — E119 Type 2 diabetes mellitus without complications: Secondary | ICD-10-CM | POA: Diagnosis not present

## 2023-11-15 DIAGNOSIS — Z7982 Long term (current) use of aspirin: Secondary | ICD-10-CM | POA: Diagnosis not present

## 2023-11-15 DIAGNOSIS — Z7984 Long term (current) use of oral hypoglycemic drugs: Secondary | ICD-10-CM | POA: Diagnosis not present

## 2023-11-15 DIAGNOSIS — Z79899 Other long term (current) drug therapy: Secondary | ICD-10-CM | POA: Diagnosis not present

## 2023-11-15 DIAGNOSIS — I252 Old myocardial infarction: Secondary | ICD-10-CM | POA: Diagnosis not present

## 2023-11-15 DIAGNOSIS — G8929 Other chronic pain: Secondary | ICD-10-CM | POA: Diagnosis not present

## 2023-11-15 DIAGNOSIS — M65331 Trigger finger, right middle finger: Secondary | ICD-10-CM | POA: Diagnosis not present

## 2023-11-15 DIAGNOSIS — I2510 Atherosclerotic heart disease of native coronary artery without angina pectoris: Secondary | ICD-10-CM | POA: Diagnosis not present

## 2023-11-15 DIAGNOSIS — M51379 Other intervertebral disc degeneration, lumbosacral region without mention of lumbar back pain or lower extremity pain: Secondary | ICD-10-CM | POA: Diagnosis not present

## 2023-11-15 DIAGNOSIS — Z955 Presence of coronary angioplasty implant and graft: Secondary | ICD-10-CM | POA: Diagnosis not present

## 2023-11-15 DIAGNOSIS — F17210 Nicotine dependence, cigarettes, uncomplicated: Secondary | ICD-10-CM | POA: Diagnosis not present

## 2023-11-15 DIAGNOSIS — M4802 Spinal stenosis, cervical region: Secondary | ICD-10-CM | POA: Diagnosis not present

## 2023-11-15 DIAGNOSIS — Z794 Long term (current) use of insulin: Secondary | ICD-10-CM | POA: Diagnosis not present

## 2023-11-15 DIAGNOSIS — I77811 Abdominal aortic ectasia: Secondary | ICD-10-CM | POA: Diagnosis not present

## 2023-11-15 DIAGNOSIS — M47812 Spondylosis without myelopathy or radiculopathy, cervical region: Secondary | ICD-10-CM | POA: Diagnosis not present

## 2023-11-15 DIAGNOSIS — Z8249 Family history of ischemic heart disease and other diseases of the circulatory system: Secondary | ICD-10-CM | POA: Diagnosis not present

## 2023-11-15 DIAGNOSIS — I480 Paroxysmal atrial fibrillation: Secondary | ICD-10-CM | POA: Diagnosis not present

## 2023-11-15 DIAGNOSIS — Z833 Family history of diabetes mellitus: Secondary | ICD-10-CM | POA: Diagnosis not present

## 2023-11-15 DIAGNOSIS — I10 Essential (primary) hypertension: Secondary | ICD-10-CM | POA: Diagnosis not present

## 2023-11-29 ENCOUNTER — Other Ambulatory Visit: Payer: Self-pay | Admitting: "Endocrinology

## 2023-11-29 DIAGNOSIS — G894 Chronic pain syndrome: Secondary | ICD-10-CM | POA: Diagnosis not present

## 2023-12-10 HISTORY — PX: HAND SURGERY: SHX662

## 2023-12-14 ENCOUNTER — Other Ambulatory Visit: Payer: Self-pay | Admitting: Cardiology

## 2023-12-19 ENCOUNTER — Ambulatory Visit: Admitting: "Endocrinology

## 2023-12-21 ENCOUNTER — Telehealth: Payer: Self-pay | Admitting: "Endocrinology

## 2023-12-21 NOTE — Telephone Encounter (Signed)
 Patient missed his 6/10 appt. Called pt to r/s, lvm, if pt calls back offer July 21 week

## 2024-01-01 DIAGNOSIS — G4700 Insomnia, unspecified: Secondary | ICD-10-CM | POA: Diagnosis not present

## 2024-01-01 DIAGNOSIS — G894 Chronic pain syndrome: Secondary | ICD-10-CM | POA: Diagnosis not present

## 2024-01-23 ENCOUNTER — Ambulatory Visit (INDEPENDENT_AMBULATORY_CARE_PROVIDER_SITE_OTHER): Admitting: "Endocrinology

## 2024-01-23 ENCOUNTER — Other Ambulatory Visit: Payer: Self-pay | Admitting: "Endocrinology

## 2024-01-23 ENCOUNTER — Encounter: Payer: Self-pay | Admitting: "Endocrinology

## 2024-01-23 VITALS — BP 108/64 | HR 56 | Ht 73.0 in | Wt 209.0 lb

## 2024-01-23 DIAGNOSIS — F17200 Nicotine dependence, unspecified, uncomplicated: Secondary | ICD-10-CM | POA: Diagnosis not present

## 2024-01-23 DIAGNOSIS — Z794 Long term (current) use of insulin: Secondary | ICD-10-CM | POA: Diagnosis not present

## 2024-01-23 DIAGNOSIS — E1159 Type 2 diabetes mellitus with other circulatory complications: Secondary | ICD-10-CM | POA: Diagnosis not present

## 2024-01-23 DIAGNOSIS — E782 Mixed hyperlipidemia: Secondary | ICD-10-CM | POA: Diagnosis not present

## 2024-01-23 DIAGNOSIS — I10 Essential (primary) hypertension: Secondary | ICD-10-CM

## 2024-01-23 LAB — POCT GLYCOSYLATED HEMOGLOBIN (HGB A1C): HbA1c, POC (controlled diabetic range): 9.1 % — AB (ref 0.0–7.0)

## 2024-01-23 MED ORDER — LANTUS SOLOSTAR 100 UNIT/ML ~~LOC~~ SOPN: 60.0000 [IU] | PEN_INJECTOR | Freq: Every day | SUBCUTANEOUS | 1 refills | Status: DC

## 2024-01-23 MED ORDER — TIRZEPATIDE 2.5 MG/0.5ML ~~LOC~~ SOAJ: 2.5000 mg | SUBCUTANEOUS | 1 refills | Status: DC

## 2024-01-23 NOTE — Progress Notes (Signed)
 01/23/2024, 4:38 PM  Endocrinology follow-up note   Subjective:    Patient ID: [name], male    DOB: 03-24-1966.  [name] is being seen in follow-up after he was seen in follow-up after he was seen in consultation for management of currently uncontrolled symptomatic diabetes requested by  Marvine Rush, MD.   Past Medical History:  Diagnosis Date   Anxiety    Borderline diabetes    Bulging disc    Chronic back pain    Closed intra-articular fracture of distal end of left tibia    Diabetes mellitus without complication (HCC)    HTN (hypertension)    Hypercholesteremia    Myocardial infarction [facility])    STEMI 2015 with BMS to RCA   Pancreatitis    Sleep apnea     Past Surgical History:  Procedure Laterality Date   ABDOMINAL SURGERY  1990   adhesions, blockage.    APPENDECTOMY     BACK SURGERY     CARDIAC CATHETERIZATION  2003   trivial coronary artery disease, normal EF   COLONOSCOPY N/A 04/16/2015   Procedure: COLONOSCOPY;  Surgeon: Lamar CHRISTELLA Hollingshead, MD;  Location: [facility];  Service: Endoscopy;  Laterality: N/A;  12:45 PM   COLONOSCOPY WITH PROPOFOL  N/A 06/28/2018   Rourk: Diverticulosis, 2 tubular adenomas removed.  Next colonoscopy in 5 years.   HAND SURGERY Right 12/2023   LEFT HEART CATH AND CORONARY ANGIOGRAPHY N/A 05/09/2017   Procedure: LEFT HEART CATH AND CORONARY ANGIOGRAPHY;  Surgeon: Anner Alm ORN, MD;  Location: [facility] INVASIVE CV LAB;  Service: Cardiovascular;  Laterality: N/A;   LEFT HEART CATHETERIZATION WITH CORONARY ANGIOGRAM N/A 03/13/2014   Procedure: LEFT HEART CATHETERIZATION WITH CORONARY ANGIOGRAM;  Surgeon: Alm ORN Anner, MD;  Location: [facility] CATH LAB;  Service: Cardiovascular;  Laterality: N/A;   OPEN REDUCTION INTERNAL FIXATION (ORIF) TIBIA/FIBULA FRACTURE Left 10/01/2018   Procedure: OPEN REDUCTION INTERNAL FIXATION LEFT PILON FRACTURE;  Surgeon: Celena Sharper, MD;  Location: [facility] OR;  Service: Orthopedics;   Laterality: Left;   POLYPECTOMY  06/28/2018   Procedure: POLYPECTOMY;  Surgeon: Hollingshead Lamar CHRISTELLA, MD;  Location: [facility];  Service: Endoscopy;;  (colon)    Social History   Socioeconomic History   Marital status: Single    Spouse name: Not on file   Number of children: Not on file   Years of education: Not on file   Highest education level: Not on file  Occupational History   Not on file  Tobacco Use   Smoking status: Every Day    Current packs/day: 0.50    Average packs/day: 0.5 packs/day for 46.2 years (23.1 ttl pk-yrs)    Types: Cigarettes    Start date: 10/25/1977   Smokeless tobacco: Never  Vaping Use   Vaping status: Never Used  Substance and Sexual Activity   Alcohol use: Not Currently    Alcohol/week: 6.0 standard drinks of alcohol    Types: 6 Cans of beer per week    Comment: denied 06/08/21   Drug use: Yes    Types: Marijuana   Sexual activity: Yes  Other Topics Concern   Not on file  Social History Narrative   Not on file   Social Drivers of Health   Financial Resource Strain: Not on file  Food Insecurity: Not on file  Transportation Needs: Not on file  Physical Activity: Not on file  Stress: Not on file  Social Connections: Not on file  Family History  Problem Relation Age of Onset   Hypertension Mother    Hyperlipidemia Mother    Diabetes Father    Hyperlipidemia Father    Hypertension Father    Hyperlipidemia Sister    Hypertension Sister    Diabetes Sister    Colon cancer Paternal Uncle        over age 43    Outpatient Encounter Medications as of 01/23/2024  Medication Sig   tirzepatide  (MOUNJARO ) 2.5 MG/0.5ML Pen Inject 2.5 mg into the skin once a week.   Accu-Chek Softclix Lancets lancets USE TO TEST BLOOD SUGAR TWICE DAILY   albuterol  (PROVENTIL ) (2.5 MG/3ML) 0.083% nebulizer solution Take 3 mLs (2.5 mg total) by nebulization every 6 (six) hours as needed for wheezing or shortness of breath.   Albuterol  Sulfate (PROAIR  HFA IN)  Inhale 2 puffs into the lungs as needed.   apixaban  (ELIQUIS ) 5 MG TABS tablet Take 1 tablet (5 mg total) by mouth 2 (two) times daily.   atorvastatin  (LIPITOR) 80 MG tablet TAKE 1 TABLET(80 MG) BY MOUTH AT BEDTIME   Blood Glucose Monitoring Suppl (ACCU-CHEK GUIDE ME) w/Device KIT USE TO CHECK BLOOD GLUCOSE   Continuous Glucose Receiver (FREESTYLE LIBRE 3 READER) DEVI Use as directed to test BG   Continuous Glucose Sensor (FREESTYLE LIBRE 3 PLUS SENSOR) MISC Change sensor every 15 days.   CRANBERRY PO Take 1 tablet by mouth every 14 (fourteen) days.   furosemide  (LASIX ) 20 MG tablet TAKE 1 TABLET(20 MG) BY MOUTH DAILY AS NEEDED   glucose blood (ACCU-CHEK GUIDE TEST) test strip USE TO CHECK BLOOD SUGAR TWICE DAILY   Insulin  Pen Needle (B-D UF III MINI PEN NEEDLES) 31G X 5 MM MISC USE TO INJECT INSULIN  EVERY DAY   Insulin  Pen Needle (B-D ULTRAFINE III SHORT PEN) 31G X 8 MM MISC USE WITH INSULIN  EVERY DAY   isosorbide  mononitrate (IMDUR ) 60 MG 24 hr tablet TAKE 1 TABLET BY MOUTH DAILY   LANTUS  SOLOSTAR 100 UNIT/ML Solostar Pen Inject 60 Units into the skin at bedtime.   LINZESS  72 MCG capsule TAKE 1 CAPSULE(72 MCG) BY MOUTH DAILY BEFORE BREAKFAST   metFORMIN  (GLUCOPHAGE ) 1000 MG tablet Take 1,000 mg by mouth 2 (two) times daily.   metoprolol  succinate (TOPROL  XL) 25 MG 24 hr tablet Take 0.5 tablets (12.5 mg total) by mouth daily.   Multiple Vitamin (MULTIVITAMIN) tablet Take 1 tablet by mouth daily.   nitroGLYCERIN  (NITROSTAT ) 0.4 MG SL tablet Place 1 tablet (0.4 mg total) under the tongue every 5 (five) minutes x 3 doses as needed for chest pain (if no relief after 2nd dose, proceed to the ED or call 911).   Omega-3 Fatty Acids (SALMON OIL-1000 PO) Take 1 capsule by mouth daily.   oxyCODONE -acetaminophen  (PERCOCET) 5-325 MG tablet Take 1 tablet by mouth every 6 (six) hours as needed.   VASCEPA  1 g capsule TAKE 2 CAPSULES(2 GRAMS) BY MOUTH TWICE DAILY   zolpidem (AMBIEN) 10 MG tablet Take 10 mg by  mouth at bedtime as needed.   [DISCONTINUED] LANTUS  SOLOSTAR 100 UNIT/ML Solostar Pen Inject 50 Units into the skin at bedtime.   No facility-administered encounter medications on file as of 01/23/2024.    ALLERGIES: No Known Allergies  VACCINATION STATUS: Immunization History  Administered Date(s) Administered   Influenza,inj,Quad PF,6+ Mos 05/27/2014   Tdap 05/29/2021    Diabetes He presents for his follow-up diabetic visit. He has type 2 diabetes mellitus. Onset time: He was diagnosed at approximate age of  49 years. His disease course has been fluctuating. There are no hypoglycemic associated symptoms. Pertinent negatives for hypoglycemia include no confusion, headaches, pallor or seizures. Pertinent negatives for diabetes include no blurred vision, no chest pain, no fatigue, no polydipsia, no polyphagia, no polyuria and no weakness. There are no hypoglycemic complications. Symptoms are worsening. Diabetic complications include heart disease. (He is status post stent placement in 1 large coronary artery.) Risk factors for coronary artery disease include dyslipidemia, diabetes mellitus, family history, hypertension, male sex, sedentary lifestyle and tobacco exposure. Current diabetic treatment includes insulin  injections (He is currently on Lantus  14 units nightly, metformin  1000 mg p.o. twice daily.). His weight is fluctuating minimally. He is following a generally unhealthy diet. When asked about meal planning, he reported none. He has not had a previous visit with a dietitian. He never participates in exercise. His home blood glucose trend is increasing steadily. His overall blood glucose range is 180-200 mg/dl. (He does not know he has received coverage at for the freestyle libre device, he did not utilize it optimally.  Very limited data from his device showing average blood glucose of 194.  His point-of-care A1c is 9.1% increasing from 7.4% during his last visit.  No reported.  ) An ACE  inhibitor/angiotensin II receptor blocker is being taken. Eye exam is current.  Hyperlipidemia This is a chronic problem. The problem is resistant. Exacerbating diseases include diabetes. Pertinent negatives include no chest pain, myalgias or shortness of breath. Current antihyperlipidemic treatment includes statins. Risk factors for coronary artery disease include dyslipidemia, diabetes mellitus, family history, male sex, hypertension and a sedentary lifestyle.  Hypertension This is a chronic problem. The current episode started more than 1 year ago. Pertinent negatives include no blurred vision, chest pain, headaches, neck pain, palpitations or shortness of breath. Risk factors for coronary artery disease include dyslipidemia, diabetes mellitus, smoking/tobacco exposure, sedentary lifestyle, male gender and family history. Past treatments include ACE inhibitors.     Objective:       01/23/2024    2:15 PM 10/31/2023    9:50 AM 10/20/2023    8:42 AM  Vitals with BMI  Height 6' 1 6' 1 6' 1  Weight 209 lbs 216 lbs 13 oz 214 lbs 3 oz  BMI 27.58 28.61 28.27  Systolic 108 132 889  Diastolic 64 86 64  Pulse 56 57 100    BP 108/64   Pulse (!) 56   Ht 6' 1 (1.854 m)   Wt 209 lb (94.8 kg)   BMI 27.57 kg/m   Wt Readings from Last 3 Encounters:  01/23/24 209 lb (94.8 kg)  10/31/23 216 lb 12.8 oz (98.3 kg)  10/20/23 214 lb 3.2 oz (97.2 kg)      CMP ( most recent) CMP     Component Value Date/Time   NA 138 10/19/2023 1208   K 4.0 10/19/2023 1208   CL 102 10/19/2023 1208   CO2 18 (L) 10/19/2023 1208   GLUCOSE 145 (H) 10/19/2023 1208   GLUCOSE 170 (H) 08/28/2023 0851   BUN 12 10/19/2023 1208   CREATININE 0.74 (L) 10/19/2023 1208   CALCIUM  9.1 10/19/2023 1208   PROT 6.4 03/03/2023 1156   ALBUMIN 4.2 03/03/2023 1156   AST 15 03/03/2023 1156   ALT 18 03/03/2023 1156   ALKPHOS 82 03/03/2023 1156   BILITOT 0.3 03/03/2023 1156   GFRNONAA >60 08/28/2023 0851   GFRAA >60 02/06/2020  0035     Diabetic Labs (most recent): Lab Results  Component Value Date   HGBA1C 9.1 (A) 01/23/2024   HGBA1C 7.4 (A) 03/06/2023   HGBA1C 7.9 (A) 11/02/2022     Lipid Panel ( most recent) Lipid Panel     Component Value Date/Time   CHOL 183 03/03/2023 1156   TRIG 403 (H) 03/03/2023 1156   HDL 31 (L) 03/03/2023 1156   CHOLHDL 5.9 (H) 03/03/2023 1156   CHOLHDL 17.3 01/20/2014 0528   VLDL UNABLE TO CALCULATE IF TRIGLYCERIDE OVER 400 mg/dL 92/86/7984 9471   LDLCALC 86 03/03/2023 1156   LABVLDL 66 (H) 03/03/2023 1156      Lab Results  Component Value Date   TSH 1.280 03/03/2023   TSH 1.08 08/22/2022   TSH 1.630 03/25/2022   TSH 1.36 02/24/2022   TSH 1.120 07/26/2021   TSH 1.100 01/19/2014   FREET4 1.11 03/03/2023   FREET4 1.15 03/25/2022   FREET4 1.37 07/26/2021      Assessment & Plan:   1. Type 2 diabetes mellitus with vascular disease (HCC)   - [name] has currently uncontrolled symptomatic type 2 DM since  59 years of age.  He does not know he has received coverage at for the freestyle libre device, he did not utilize it optimally.  He has had limited data from his device showing average blood glucose of 194.  AGP shows 50% time range, 29% level 1 hyperglycemia, 21% level 2 hyperglycemia.  His point-of-care A1c is 9.1% increasing from 7.4% during his last visit.  No reported.    - I had a long discussion with him about the progressive nature of diabetes and the pathology behind its complications. -his diabetes is complicated by coronary artery disease, chronic heavy smoking, sedentary life and he remains at a high risk for more acute and chronic complications which include CAD, CVA, CKD, retinopathy, and neuropathy. These are all discussed in detail with him.  - I have counseled him on diet  and weight management  by adopting a carbohydrate restricted/protein rich diet. Patient is encouraged to switch to  unprocessed or minimally processed     complex starch and  increased protein intake (animal or plant source), fruits, and vegetables. -  he is advised to stick to a routine mealtimes to eat 3 meals  a day and avoid unnecessary snacks ( to snack only to correct hypoglycemia).  - he acknowledges that there is a room for improvement in his food and drink choices. - Suggestion is made for him to avoid simple carbohydrates  from his diet including Cakes, Sweet Desserts, Ice Cream, Soda (diet and regular), Sweet Tea, Candies, Chips, Cookies, Store Bought Juices, Alcohol , Artificial Sweeteners,  Coffee Creamer, and Sugar-free Products, Lemonade. This will help patient to have more stable blood glucose profile and potentially avoid unintended weight gain.  - I have approached him with the following individualized plan to manage  his diabetes and patient agrees:   -He presents with disengagement and loss of control of glycemia.  He will not execute multiple daily injections of insulin .  High-protein for GLP-1 receptor agonists.  I discussed and prescribed Mounjaro  2.5 mg subcutaneously weekly.  Given his dyslipidemia and smoking, his risk for pancreatitis is high.  This medication will be advanced slowly as he tolerates.  Side effects and precautions discussed with him.   - He is advised to increase Lantus  to 60 units nightly.  Metformin  1000 mg p.o. twice daily. -He is urged to utilize his CGM at all times-multiple times a day for more  information. -he  is encouraged to call clinic for hypoglycemia below 70 mg per DL or hyperglycemia above 200 mg per DL.  - he is warned not to take insulin  without proper monitoring per orders.  -he did not tolerate Jardiance, Farxiga in the past and he does not want to start any of those medications.  - Specific targets for  A1c;  LDL, HDL,  and Triglycerides were discussed with the patient.  2) Blood Pressure /Hypertension:  - His blood pressure is controlled to target.  he is advised to continue his current medications  including lisinopril  2.5 mg p.o. daily with breakfast . 3) Lipids/Hyperlipidemia:   Review of his recent lipid panel showed  uncontrolled with triglyceride 403 and LDL 86.  See above for hypertriglyceridemia.  He is advised to continue Lipitor 40 g p.o. nightly and Vascepa  2 g p.o. twice daily.  Side effects and precautions discussed with him.     Whole food plant-based diet, discussed in detail, will help with dyslipidemia as well.  4)  Weight/Diet:  Body mass index is 27.57 kg/m.  -      he is  a candidate for some  weight loss. I discussed with him the fact that loss of 5 - 10% of his  current body weight will have the most impact on his diabetes management.  Exercise, and detailed carbohydrates information provided  -  detailed on discharge instructions.  5) Chronic Care/Health Maintenance: 6) chronic smoker  -he  is on ACEI/ARB and Statin medications and  is encouraged to initiate and continue to follow up with Ophthalmology, Dentist,  Podiatrist at least yearly or according to recommendations, and advised to  quit smoking. I have recommended yearly flu vaccine and pneumonia vaccine at least every 5 years; moderate intensity exercise for up to 150 minutes weekly; and  sleep for at least 7 hours a day.  -He is started on low-dose vitamin D3 2000 units daily.  The patient was counseled on the dangers of tobacco use, and was advised to quit.  Reviewed strategies to maximize success, including removing cigarettes and smoking materials from environment.   - he is  advised to maintain close follow up with Marvine Rush, MD for primary care needs, as well as his other providers for optimal and coordinated care.   I spent  40  minutes in the care of the patient today including review of labs from CMP, Lipids, Thyroid  Function, Hematology (current and previous including abstractions from other facilities); face-to-face time discussing  his blood glucose readings/logs, discussing hypoglycemia and  hyperglycemia episodes and symptoms, medications doses, his options of short and long term treatment based on the latest standards of care / guidelines;  discussion about incorporating lifestyle medicine;  and documenting the encounter. Risk reduction counseling performed per USPSTF guidelines to reduce  cardiovascular risk factors.     Please refer to Patient Instructions for Blood Glucose Monitoring and Insulin /Medications Dosing Guide  in media tab for additional information. Please  also refer to  Patient Self Inventory in the Media  tab for reviewed elements of pertinent patient history.  [name] participated in the discussions, expressed understanding, and voiced agreement with the above plans.  All questions were answered to his satisfaction. he is encouraged to contact clinic should he have any questions or concerns prior to his return visit.   Follow up plan: - Return in about 3 months (around 04/24/2024) for F/U with Pre-visit Labs, Meter/CGM/Logs, A1c here.  Ranny Earl, MD  [facility] [address] [place], [place] [address] Phone: [phone]  Fax: [phone]    01/23/2024, 4:38 PM  This note was partially dictated with voice recognition software. Similar sounding words can be transcribed inadequately or may not  be corrected upon review.

## 2024-01-23 NOTE — Patient Instructions (Signed)

## 2024-01-26 ENCOUNTER — Other Ambulatory Visit: Payer: Self-pay | Admitting: Cardiology

## 2024-01-31 DIAGNOSIS — G894 Chronic pain syndrome: Secondary | ICD-10-CM | POA: Diagnosis not present

## 2024-03-01 DIAGNOSIS — G894 Chronic pain syndrome: Secondary | ICD-10-CM | POA: Diagnosis not present

## 2024-03-14 DIAGNOSIS — I10 Essential (primary) hypertension: Secondary | ICD-10-CM | POA: Diagnosis not present

## 2024-03-14 DIAGNOSIS — E1159 Type 2 diabetes mellitus with other circulatory complications: Secondary | ICD-10-CM | POA: Diagnosis not present

## 2024-03-14 DIAGNOSIS — I2510 Atherosclerotic heart disease of native coronary artery without angina pectoris: Secondary | ICD-10-CM | POA: Diagnosis not present

## 2024-03-14 DIAGNOSIS — G894 Chronic pain syndrome: Secondary | ICD-10-CM | POA: Diagnosis not present

## 2024-03-14 DIAGNOSIS — E1169 Type 2 diabetes mellitus with other specified complication: Secondary | ICD-10-CM | POA: Diagnosis not present

## 2024-03-14 DIAGNOSIS — G4700 Insomnia, unspecified: Secondary | ICD-10-CM | POA: Diagnosis not present

## 2024-03-14 DIAGNOSIS — D696 Thrombocytopenia, unspecified: Secondary | ICD-10-CM | POA: Diagnosis not present

## 2024-03-19 DIAGNOSIS — M19011 Primary osteoarthritis, right shoulder: Secondary | ICD-10-CM | POA: Diagnosis not present

## 2024-03-19 DIAGNOSIS — G8929 Other chronic pain: Secondary | ICD-10-CM | POA: Diagnosis not present

## 2024-03-19 DIAGNOSIS — M7581 Other shoulder lesions, right shoulder: Secondary | ICD-10-CM | POA: Diagnosis not present

## 2024-03-19 DIAGNOSIS — M19012 Primary osteoarthritis, left shoulder: Secondary | ICD-10-CM | POA: Diagnosis not present

## 2024-03-19 DIAGNOSIS — M25511 Pain in right shoulder: Secondary | ICD-10-CM | POA: Diagnosis not present

## 2024-03-19 DIAGNOSIS — M75101 Unspecified rotator cuff tear or rupture of right shoulder, not specified as traumatic: Secondary | ICD-10-CM | POA: Diagnosis not present

## 2024-03-19 DIAGNOSIS — M25512 Pain in left shoulder: Secondary | ICD-10-CM | POA: Diagnosis not present

## 2024-03-19 DIAGNOSIS — M75102 Unspecified rotator cuff tear or rupture of left shoulder, not specified as traumatic: Secondary | ICD-10-CM | POA: Diagnosis not present

## 2024-04-10 ENCOUNTER — Other Ambulatory Visit: Payer: Self-pay | Admitting: "Endocrinology

## 2024-04-17 ENCOUNTER — Encounter: Payer: Self-pay | Admitting: Gastroenterology

## 2024-04-24 ENCOUNTER — Other Ambulatory Visit: Payer: Self-pay | Admitting: Cardiology

## 2024-04-24 ENCOUNTER — Ambulatory Visit: Admitting: "Endocrinology

## 2024-04-29 DIAGNOSIS — G894 Chronic pain syndrome: Secondary | ICD-10-CM | POA: Diagnosis not present

## 2024-04-29 DIAGNOSIS — Z23 Encounter for immunization: Secondary | ICD-10-CM | POA: Diagnosis not present

## 2024-05-02 ENCOUNTER — Encounter: Payer: Self-pay | Admitting: Nurse Practitioner

## 2024-05-02 ENCOUNTER — Ambulatory Visit: Attending: Nurse Practitioner | Admitting: Nurse Practitioner

## 2024-05-02 VITALS — BP 110/70 | HR 67 | Ht 73.0 in | Wt 200.6 lb

## 2024-05-02 DIAGNOSIS — I255 Ischemic cardiomyopathy: Secondary | ICD-10-CM

## 2024-05-02 DIAGNOSIS — I480 Paroxysmal atrial fibrillation: Secondary | ICD-10-CM

## 2024-05-02 DIAGNOSIS — I5020 Unspecified systolic (congestive) heart failure: Secondary | ICD-10-CM

## 2024-05-02 DIAGNOSIS — I2510 Atherosclerotic heart disease of native coronary artery without angina pectoris: Secondary | ICD-10-CM | POA: Diagnosis not present

## 2024-05-02 DIAGNOSIS — E785 Hyperlipidemia, unspecified: Secondary | ICD-10-CM

## 2024-05-02 DIAGNOSIS — I10 Essential (primary) hypertension: Secondary | ICD-10-CM

## 2024-05-02 MED ORDER — NITROGLYCERIN 0.4 MG SL SUBL: 0.4000 mg | SUBLINGUAL_TABLET | SUBLINGUAL | 3 refills | Status: AC | PRN

## 2024-05-02 MED ORDER — ATORVASTATIN CALCIUM 40 MG PO TABS: 40.0000 mg | ORAL_TABLET | Freq: Every day | ORAL | 2 refills | Status: AC

## 2024-05-02 NOTE — Patient Instructions (Addendum)
 Medication Instructions:  Your physician has recommended you make the following change in your medication:  Please reduce Atorvastatin  to 40 Mg daily   Labwork: In 1 month at [facility]   Testing/Procedures: None   Follow-Up: Your physician recommends that you schedule a follow-up appointment in: 6 Months with Dr.Branch   Any Other Special Instructions Will Be Listed Below (If Applicable).  If you need a refill on your cardiac medications before your next appointment, please call your pharmacy.

## 2024-05-02 NOTE — Progress Notes (Unsigned)
 Cardiology Office Note:  .   Date: 10/31/2023 ID:  [name], DOB 1965/08/03, MRN [id number] PCP: Marvine Rush, MD  [facility] Providers Cardiologist:  None    History of Present Illness: .   [name] is a 58 y.o. male with a PMH of CAD, hyperlipidemia, hypertension, chronic back pain, presents today for A-fib evaluation.  Previous cardiovascular history of inferior NSTEMI in 2015, received bare-metal stent to RCA.  EF at that time 45 to 50%.  Repeat cardiac catheterization in 2018 showed patent coronaries with patent RCA stent.  NST in 2021 was negative for ischemia.  Last seen by Dr. Dorn Ross on January 02, 2023.  Was overall doing well at that time.  Our office has been contacted because he was pending upcoming surgery.  However, this was canceled as he was found to be in A-fib for preop visit.   He presents for evaluation. He states he did not know he was in Atrial Fibrillation at pre-op visit, denies any symptoms.  Tells me he is pending tendon sheath incision for trigger finger on September 06, 2023.  Tells me he was told in the past he needed a CPAP but refused this.  Says he has never been on his CPAP machine. Denies any chest pain, shortness of breath, palpitations, syncope, presyncope, dizziness, orthopnea, PND, swelling or significant weight changes, acute bleeding, or claudication.  10/31/2023 -he presents today for follow-up.  Doing well.  Denies any acute changes to his health since I last saw him. Denies any chest pain, shortness of breath, palpitations, syncope, presyncope, dizziness, orthopnea, PND, swelling or significant weight changes, acute bleeding, or claudication.  Tolerating his medications well.  ROS: Negative.  See HPI.  Studies Reviewed: SABRA    EKG:     Cardiac monitor 10/2023:    2 day monitor   Rare supraventricular ectopy in the form of isolatated PACs, couplets   Rare ventricular ectopy in the form of isolated PVCs, couplets   Episodes of  afib during study. Computer generated report indicates 100% burden however there are times where patient is in normal sinus rhythm. Rates were 56 to 192, average rates 99   Device likely positioned incorrectly, abnormal QRS and T wave axis   There are patient triggered events but not reported symptoms included. Tracings show sinus rhythm and afib.     Patch Wear Time:  2 days and 13 hours (2025-03-17T10:47:32-0400 to 2025-03-19T23:54:51-0400)   Atrial Fibrillation occurred continuously (100% burden), ranging from 56-192 bpm (avg of 99 bpm). Atrial Fibrillation was present at activation of device. Isolated VEs were rare (<1.0%), VE Couplets were rare (<1.0%), and no VE Triplets were present.  Inverted QRS complexes possibly due to inverted placement of device. Difficulty discerning atrial activity making definitive diagnosis difficult to ascertain.  Lexiscan  03/2020:  No diagnostic ST segment changes to indicate ischemia. Medium sized, moderate intensity, fixed apical to basal inferior defect that is more prominent at rest and consistent with soft tissue attenuation versus scar. No substantial ischemic territories. This is a low risk study. Nuclear stress EF: 51%.  LHC 04/2017:  Angiographically normal coronary arteries Dist RCA BMS (VeriFlex 5.0 x 15), 0 %stenosed. The left ventricular systolic function is normal. The left ventricular ejection fraction is 50-55% by visual estimate. LV end diastolic pressure is moderately elevated.   Patient essentially has intra-graphic normal coronary arteries with widely RCA.  No culprit lesion to explain inferior ischemia. Elevated LVEDP could explain microvascular ischemia.  Otherwise consider non-anginal etiology for chest pain.  Physical Exam:   VS:  BP 110/70   Pulse 67   Ht 6' 1 (1.854 m)   Wt 200 lb 9.6 oz (91 kg)   SpO2 98%   BMI 26.47 kg/m    Wt Readings from Last 3 Encounters:  05/02/24 200 lb 9.6 oz (91 kg)  01/23/24 209 lb (94.8  kg)  10/31/23 216 lb 12.8 oz (98.3 kg)    GEN: Well nourished, well developed in no acute distress NECK: No JVD; No carotid bruits CARDIAC: S1/S2, RRR, no murmurs, rubs, gallops RESPIRATORY:  Clear to auscultation without rales, wheezing or rhonchi  ABDOMEN: Soft, non-tender, non-distended EXTREMITIES:  No edema; No deformity   ASSESSMENT AND PLAN: .    PAF Diagnosed originally with A-fib at preop visit.  See most recent heart monitor that showed episodes of A-fib during study.  Appears he has paroxysmal A-fib, asymptomatic regarding this.  Today he is in sinus bradycardia, asymptomatic.  Currently tolerating his medications well.  Continue Eliquis  5 mg twice daily for stroke prevention.  Continue Toprol -XL for rate control. Heart healthy diet and regular cardiovascular exercise encouraged.  Will update echocardiogram to be done as soon as possible before cardiac clearance can be given.  CAD, hx of HFmrEF/ICM Stable with no anginal symptoms. No indication for ischemic evaluation. Past mildly reduced EF in 2015 during time of NSTEMI, no recent Echo since then.  Left heart cath in 2018 showed normal coronary arteries. Euvolemic and well compensated on exam.  No longer on aspirin  due to being on Eliquis .  Continue atorvastatin , Toprol -XL, Vascepa , and nitroglycerin  as needed. Low sodium diet, fluid restriction <2L, and daily weights encouraged. Educated to contact our office for weight gain of 2 lbs overnight or 5 lbs in one week. Will update Echo at this time.   HTN Blood pressure mildly elevated, tells me normally BP is well-controlled per his report.  Discussed SBP goal is less than 130.  Does admit to some dietary indiscretions.  Discussed to monitor BP at home at least 2 hours after medications and sitting for 5-10 minutes.  No medication changes at this time. Low salt, heart healthy diet and regular cardiovascular exercise encouraged.   HLD LDL 86 from August 2024.  Continue current  medication regimen. Heart healthy diet and regular cardiovascular exercise encouraged.   Pre-op cardiovascular risk assessment Mr. Mundo's perioperative risk of a major cardiac event is  % according to the Revised Cardiac Risk Index (RCRI).  Therefore, he is at high risk for perioperative complications.   His functional capacity is excellent at   METs according to the Duke Activity Status Index (DASI). Recommendations: According to ACC/AHA guidelines, no further cardiovascular testing needed.  The patient may proceed to surgery at acceptable risk.   Antiplatelet and/or Anticoagulation Recommendations: Per office protocol, patient can hold Eliquis  for 2 days prior to procedure. Will route note to requesting party.   Addendum: Recent Echo was overall benign. Per office protocol, patient can hold Eliquis  for 2 days prior to procedure.    Dispo: Follow-up with Dr. Dorn Ross or APP in 6 months or sooner anything changes.  Signed, Almarie Crate, NP

## 2024-05-10 ENCOUNTER — Other Ambulatory Visit: Payer: Self-pay | Admitting: "Endocrinology

## 2024-05-16 ENCOUNTER — Telehealth: Payer: Self-pay | Admitting: Nurse Practitioner

## 2024-05-16 NOTE — Telephone Encounter (Signed)
 Pt c/o of Chest Pain: STAT if active (IN THIS MOMENT) CP, including tightness, pressure, jaw pain, shoulder/upper arm/back pain, SOB, nausea, and vomiting.  1. Are you having CP right now (tightness, pressure, or discomfort)? No  2. Are you experiencing any other symptoms (ex. SOB, nausea, vomiting, sweating)?  No   3. How long have you been experiencing CP?  About 1 week  4. Is your CP continuous or coming and going?  Coming and going  5. Have you taken Nitroglycerin ?  Yes

## 2024-05-16 NOTE — Telephone Encounter (Signed)
 Patient stated that he has been having chest pains the last couple of days. He stated that its a dull pain in his chest. He went to his PCP and they done a EKG. I have requested it. Reported when he takes a deep breath it hurts a little bit. He reported it started when he picked up an air conditioner unit a couple days ago and that's when it started. He thinks he may have pulled a muscle. He took a nitroglycerin  at his PCP office. He checked his BP while on the phone 122/68 HR 68. Taken his medications and stated that he is feeling fine. Advised him will send to Dr. Alvan for review

## 2024-05-17 NOTE — Telephone Encounter (Signed)
 If chest pain is worst with moving/position, deep breathing, pressing on the area, or lasing 30  minutes or longer continously would not be consistent with cardiac chest pain. Did his pcp think it was heart related on their evaluation or confirmed they thought it was strain from heavy lifting.   JINNY Ross MD

## 2024-05-20 NOTE — Telephone Encounter (Signed)
 Left message for patient to call the office.

## 2024-05-27 NOTE — Progress Notes (Signed)
 [name]                                          MRN: [id number]   05/27/2024   The [facility] Quality Team Specialist reviewed this patient medical record for the purposes of chart review for care gap closure. The following were reviewed: chart review for care gap closure-glycemic status assessment.    [facility] Quality Team

## 2024-06-04 ENCOUNTER — Encounter: Payer: Self-pay | Admitting: *Deleted

## 2024-06-04 NOTE — Progress Notes (Signed)
 [name]                                          MRN: [id number]   06/04/2024   The [facility] Quality Team Specialist reviewed this patient medical record for the purposes of chart review for care gap closure. The following were reviewed: chart review for care gap closure-glycemic status assessment and kidney health evaluation for diabetes:eGFR  and uACR.    [facility] Quality Team

## 2024-06-27 ENCOUNTER — Other Ambulatory Visit: Payer: Self-pay | Admitting: Nurse Practitioner

## 2024-07-02 NOTE — Telephone Encounter (Signed)
 Called patient to follow up on his chest pains. Patient reported he was doing fine. No further issues

## 2024-07-08 ENCOUNTER — Ambulatory Visit: Admitting: "Endocrinology

## 2024-07-22 ENCOUNTER — Other Ambulatory Visit: Payer: Self-pay | Admitting: "Endocrinology

## 2024-08-13 ENCOUNTER — Telehealth: Payer: Self-pay | Admitting: "Endocrinology

## 2024-08-13 NOTE — Telephone Encounter (Signed)
Update labs please

## 2024-08-14 ENCOUNTER — Other Ambulatory Visit: Payer: Self-pay | Admitting: *Deleted

## 2024-08-14 DIAGNOSIS — F17200 Nicotine dependence, unspecified, uncomplicated: Secondary | ICD-10-CM

## 2024-08-14 DIAGNOSIS — E1159 Type 2 diabetes mellitus with other circulatory complications: Secondary | ICD-10-CM

## 2024-08-14 DIAGNOSIS — E782 Mixed hyperlipidemia: Secondary | ICD-10-CM

## 2024-08-14 DIAGNOSIS — I10 Essential (primary) hypertension: Secondary | ICD-10-CM

## 2024-08-14 DIAGNOSIS — Z794 Long term (current) use of insulin: Secondary | ICD-10-CM

## 2024-08-14 NOTE — Telephone Encounter (Signed)
 Labs updated

## 2024-08-21 ENCOUNTER — Ambulatory Visit: Admitting: "Endocrinology
